# Patient Record
Sex: Male | Born: 1940 | Race: Black or African American | Hispanic: No | Smoking: Former smoker
Health system: Southern US, Community
[De-identification: ages and names within clinical notes are randomized; demographics above are authoritative.]

## PROBLEM LIST (undated history)

## (undated) DIAGNOSIS — I6529 Occlusion and stenosis of unspecified carotid artery: Secondary | ICD-10-CM

## (undated) DIAGNOSIS — R7989 Other specified abnormal findings of blood chemistry: Secondary | ICD-10-CM

## (undated) DIAGNOSIS — N189 Chronic kidney disease, unspecified: Secondary | ICD-10-CM

## (undated) DIAGNOSIS — M109 Gout, unspecified: Secondary | ICD-10-CM

## (undated) DIAGNOSIS — K2210 Ulcer of esophagus without bleeding: Secondary | ICD-10-CM

## (undated) DIAGNOSIS — D759 Disease of blood and blood-forming organs, unspecified: Secondary | ICD-10-CM

## (undated) DIAGNOSIS — I10 Essential (primary) hypertension: Secondary | ICD-10-CM

## (undated) DIAGNOSIS — C61 Malignant neoplasm of prostate: Secondary | ICD-10-CM

## (undated) DIAGNOSIS — I639 Cerebral infarction, unspecified: Secondary | ICD-10-CM

## (undated) DIAGNOSIS — G4730 Sleep apnea, unspecified: Secondary | ICD-10-CM

## (undated) DIAGNOSIS — E785 Hyperlipidemia, unspecified: Secondary | ICD-10-CM

## (undated) DIAGNOSIS — N529 Male erectile dysfunction, unspecified: Secondary | ICD-10-CM

## (undated) DIAGNOSIS — H9190 Unspecified hearing loss, unspecified ear: Secondary | ICD-10-CM

## (undated) DIAGNOSIS — I219 Acute myocardial infarction, unspecified: Secondary | ICD-10-CM

## (undated) DIAGNOSIS — R0600 Dyspnea, unspecified: Secondary | ICD-10-CM

## (undated) DIAGNOSIS — G629 Polyneuropathy, unspecified: Secondary | ICD-10-CM

## (undated) DIAGNOSIS — E119 Type 2 diabetes mellitus without complications: Secondary | ICD-10-CM

## (undated) DIAGNOSIS — I6523 Occlusion and stenosis of bilateral carotid arteries: Secondary | ICD-10-CM

## (undated) DIAGNOSIS — R778 Other specified abnormalities of plasma proteins: Secondary | ICD-10-CM

## (undated) DIAGNOSIS — N185 Chronic kidney disease, stage 5: Secondary | ICD-10-CM

## (undated) DIAGNOSIS — K219 Gastro-esophageal reflux disease without esophagitis: Secondary | ICD-10-CM

## (undated) DIAGNOSIS — I25810 Atherosclerosis of coronary artery bypass graft(s) without angina pectoris: Secondary | ICD-10-CM

## (undated) HISTORY — PX: CORONARY ANGIOPLASTY: SHX604

## (undated) HISTORY — PX: ARTERIAL BYPASS SURGRY: SHX557

## (undated) HISTORY — PX: CAROTID ENDARTERECTOMY: SUR193

## (undated) HISTORY — PX: CORONARY ARTERY BYPASS GRAFT: SHX141

## (undated) HISTORY — PX: OTHER SURGICAL HISTORY: SHX169

---

## 2002-04-27 HISTORY — PX: OTHER SURGICAL HISTORY: SHX169

## 2002-05-27 HISTORY — PX: OTHER SURGICAL HISTORY: SHX169

## 2007-09-26 ENCOUNTER — Ambulatory Visit: Payer: Self-pay | Admitting: Internal Medicine

## 2007-11-04 ENCOUNTER — Ambulatory Visit: Payer: Self-pay | Admitting: Unknown Physician Specialty

## 2007-11-28 ENCOUNTER — Ambulatory Visit: Payer: Self-pay | Admitting: Unknown Physician Specialty

## 2007-12-29 ENCOUNTER — Ambulatory Visit: Payer: Self-pay | Admitting: Unknown Physician Specialty

## 2008-03-03 ENCOUNTER — Ambulatory Visit: Payer: Self-pay | Admitting: Gastroenterology

## 2008-12-18 ENCOUNTER — Ambulatory Visit: Payer: Self-pay | Admitting: Internal Medicine

## 2009-11-01 ENCOUNTER — Ambulatory Visit: Payer: Self-pay | Admitting: Family Medicine

## 2010-09-06 ENCOUNTER — Ambulatory Visit: Payer: Self-pay | Admitting: Family Medicine

## 2011-02-08 ENCOUNTER — Ambulatory Visit: Payer: Self-pay | Admitting: Internal Medicine

## 2012-05-15 ENCOUNTER — Ambulatory Visit: Payer: Self-pay | Admitting: Internal Medicine

## 2012-06-05 ENCOUNTER — Ambulatory Visit: Payer: Self-pay | Admitting: Internal Medicine

## 2013-11-11 ENCOUNTER — Ambulatory Visit: Payer: Self-pay | Admitting: Internal Medicine

## 2015-08-02 ENCOUNTER — Emergency Department: Payer: Medicare Other

## 2015-08-02 ENCOUNTER — Encounter: Payer: Self-pay | Admitting: Emergency Medicine

## 2015-08-02 ENCOUNTER — Inpatient Hospital Stay
Admission: EM | Admit: 2015-08-02 | Discharge: 2015-08-06 | DRG: 392 | Disposition: A | Discharge status: Home / Self Care | Payer: Medicare Other | Attending: Internal Medicine | Admitting: Internal Medicine

## 2015-08-02 DIAGNOSIS — M109 Gout, unspecified: Secondary | ICD-10-CM | POA: Diagnosis present

## 2015-08-02 DIAGNOSIS — Z794 Long term (current) use of insulin: Secondary | ICD-10-CM

## 2015-08-02 DIAGNOSIS — E1022 Type 1 diabetes mellitus with diabetic chronic kidney disease: Secondary | ICD-10-CM | POA: Diagnosis present

## 2015-08-02 DIAGNOSIS — E1040 Type 1 diabetes mellitus with diabetic neuropathy, unspecified: Secondary | ICD-10-CM | POA: Diagnosis present

## 2015-08-02 DIAGNOSIS — G629 Polyneuropathy, unspecified: Secondary | ICD-10-CM | POA: Diagnosis present

## 2015-08-02 DIAGNOSIS — Z8249 Family history of ischemic heart disease and other diseases of the circulatory system: Secondary | ICD-10-CM | POA: Diagnosis not present

## 2015-08-02 DIAGNOSIS — I214 Non-ST elevation (NSTEMI) myocardial infarction: Secondary | ICD-10-CM

## 2015-08-02 DIAGNOSIS — I2510 Atherosclerotic heart disease of native coronary artery without angina pectoris: Secondary | ICD-10-CM | POA: Diagnosis present

## 2015-08-02 DIAGNOSIS — G4733 Obstructive sleep apnea (adult) (pediatric): Secondary | ICD-10-CM | POA: Diagnosis present

## 2015-08-02 DIAGNOSIS — Z8546 Personal history of malignant neoplasm of prostate: Secondary | ICD-10-CM

## 2015-08-02 DIAGNOSIS — I129 Hypertensive chronic kidney disease with stage 1 through stage 4 chronic kidney disease, or unspecified chronic kidney disease: Secondary | ICD-10-CM | POA: Diagnosis present

## 2015-08-02 DIAGNOSIS — J189 Pneumonia, unspecified organism: Secondary | ICD-10-CM

## 2015-08-02 DIAGNOSIS — D72829 Elevated white blood cell count, unspecified: Secondary | ICD-10-CM | POA: Diagnosis present

## 2015-08-02 DIAGNOSIS — R1011 Right upper quadrant pain: Secondary | ICD-10-CM | POA: Diagnosis present

## 2015-08-02 DIAGNOSIS — K210 Gastro-esophageal reflux disease with esophagitis: Secondary | ICD-10-CM | POA: Diagnosis present

## 2015-08-02 DIAGNOSIS — N183 Chronic kidney disease, stage 3 (moderate): Secondary | ICD-10-CM | POA: Diagnosis present

## 2015-08-02 DIAGNOSIS — I6523 Occlusion and stenosis of bilateral carotid arteries: Secondary | ICD-10-CM | POA: Diagnosis present

## 2015-08-02 DIAGNOSIS — R109 Unspecified abdominal pain: Secondary | ICD-10-CM | POA: Insufficient documentation

## 2015-08-02 DIAGNOSIS — R1013 Epigastric pain: Secondary | ICD-10-CM | POA: Diagnosis present

## 2015-08-02 DIAGNOSIS — Z833 Family history of diabetes mellitus: Secondary | ICD-10-CM | POA: Diagnosis not present

## 2015-08-02 DIAGNOSIS — E785 Hyperlipidemia, unspecified: Secondary | ICD-10-CM | POA: Diagnosis present

## 2015-08-02 DIAGNOSIS — K2210 Ulcer of esophagus without bleeding: Secondary | ICD-10-CM | POA: Diagnosis present

## 2015-08-02 DIAGNOSIS — E669 Obesity, unspecified: Secondary | ICD-10-CM | POA: Diagnosis present

## 2015-08-02 DIAGNOSIS — I248 Other forms of acute ischemic heart disease: Secondary | ICD-10-CM | POA: Diagnosis present

## 2015-08-02 DIAGNOSIS — Z6841 Body Mass Index (BMI) 40.0 and over, adult: Secondary | ICD-10-CM | POA: Diagnosis not present

## 2015-08-02 DIAGNOSIS — K2980 Duodenitis without bleeding: Secondary | ICD-10-CM | POA: Diagnosis present

## 2015-08-02 HISTORY — DX: Gout, unspecified: M10.9

## 2015-08-02 HISTORY — DX: Atherosclerosis of coronary artery bypass graft(s) without angina pectoris: I25.810

## 2015-08-02 HISTORY — DX: Malignant neoplasm of prostate: C61

## 2015-08-02 HISTORY — DX: Polyneuropathy, unspecified: G62.9

## 2015-08-02 HISTORY — DX: Essential (primary) hypertension: I10

## 2015-08-02 HISTORY — DX: Type 2 diabetes mellitus without complications: E11.9

## 2015-08-02 LAB — CBC
HEMATOCRIT: 43 % (ref 40.0–52.0)
Hemoglobin: 14.4 g/dL (ref 13.0–18.0)
MCH: 29.9 pg (ref 26.0–34.0)
MCHC: 33.5 g/dL (ref 32.0–36.0)
MCV: 89.3 fL (ref 80.0–100.0)
PLATELETS: 272 10*3/uL (ref 150–440)
RBC: 4.81 MIL/uL (ref 4.40–5.90)
RDW: 13.3 % (ref 11.5–14.5)
WBC: 15.3 10*3/uL — ABNORMAL HIGH (ref 3.8–10.6)

## 2015-08-02 LAB — COMPREHENSIVE METABOLIC PANEL
ALT: 20 U/L (ref 17–63)
AST: 28 U/L (ref 15–41)
Albumin: 4.1 g/dL (ref 3.5–5.0)
Alkaline Phosphatase: 179 U/L — ABNORMAL HIGH (ref 38–126)
Anion gap: 9 (ref 5–15)
BUN: 33 mg/dL — ABNORMAL HIGH (ref 6–20)
CHLORIDE: 104 mmol/L (ref 101–111)
CO2: 21 mmol/L — ABNORMAL LOW (ref 22–32)
Calcium: 9.1 mg/dL (ref 8.9–10.3)
Creatinine, Ser: 2.19 mg/dL — ABNORMAL HIGH (ref 0.61–1.24)
GFR, EST AFRICAN AMERICAN: 33 mL/min — AB (ref >60)
GFR, EST NON AFRICAN AMERICAN: 28 mL/min — AB (ref >60)
Glucose, Bld: 296 mg/dL — ABNORMAL HIGH (ref 65–99)
POTASSIUM: 4.5 mmol/L (ref 3.5–5.1)
Sodium: 134 mmol/L — ABNORMAL LOW (ref 135–145)
Total Bilirubin: 1.3 mg/dL — ABNORMAL HIGH (ref 0.3–1.2)
Total Protein: 7.9 g/dL (ref 6.5–8.1)

## 2015-08-02 LAB — CBC WITH DIFFERENTIAL/PLATELET
BASOS ABS: 0.1 10*3/uL (ref 0–0.1)
Basophils Relative: 0 %
EOS PCT: 0 %
Eosinophils Absolute: 0 10*3/uL (ref 0–0.7)
HCT: 42.5 % (ref 40.0–52.0)
Hemoglobin: 14.2 g/dL (ref 13.0–18.0)
LYMPHS ABS: 1.4 10*3/uL (ref 1.0–3.6)
LYMPHS PCT: 9 %
MCH: 29.8 pg (ref 26.0–34.0)
MCHC: 33.4 g/dL (ref 32.0–36.0)
MCV: 89.2 fL (ref 80.0–100.0)
MONO ABS: 0.9 10*3/uL (ref 0.2–1.0)
Monocytes Relative: 6 %
Neutro Abs: 12.4 10*3/uL — ABNORMAL HIGH (ref 1.4–6.5)
Neutrophils Relative %: 85 %
PLATELETS: 283 10*3/uL (ref 150–440)
RBC: 4.77 MIL/uL (ref 4.40–5.90)
RDW: 13.5 % (ref 11.5–14.5)
WBC: 14.7 10*3/uL — ABNORMAL HIGH (ref 3.8–10.6)

## 2015-08-02 LAB — URINALYSIS COMPLETE WITH MICROSCOPIC (ARMC ONLY)
BILIRUBIN URINE: NEGATIVE
Bacteria, UA: NONE SEEN
Ketones, ur: NEGATIVE mg/dL
LEUKOCYTES UA: NEGATIVE
NITRITE: NEGATIVE
Protein, ur: >500 mg/dL — AB
SPECIFIC GRAVITY, URINE: 1.014 (ref 1.005–1.030)
Squamous Epithelial / LPF: NONE SEEN
pH: 5 (ref 5.0–8.0)

## 2015-08-02 LAB — GLUCOSE, CAPILLARY
GLUCOSE-CAPILLARY: 241 mg/dL — AB (ref 65–99)
GLUCOSE-CAPILLARY: 277 mg/dL — AB (ref 65–99)

## 2015-08-02 LAB — TROPONIN I
TROPONIN I: 0.25 ng/mL — AB (ref <0.031)
TROPONIN I: 0.15 ng/mL — AB (ref <0.031)
Troponin I: 0.21 ng/mL — ABNORMAL HIGH (ref <0.031)
Troponin I: 0.17 ng/mL — ABNORMAL HIGH (ref <0.031)

## 2015-08-02 LAB — LIPASE, BLOOD: LIPASE: 38 U/L (ref 22–51)

## 2015-08-02 LAB — PROTIME-INR
INR: 1.02
Prothrombin Time: 13.6 seconds (ref 11.4–15.0)

## 2015-08-02 LAB — TSH: TSH: 0.831 u[IU]/mL (ref 0.350–4.500)

## 2015-08-02 LAB — CREATININE, SERUM
Creatinine, Ser: 2.29 mg/dL — ABNORMAL HIGH (ref 0.61–1.24)
GFR calc Af Amer: 31 mL/min — ABNORMAL LOW (ref >60)
GFR, EST NON AFRICAN AMERICAN: 27 mL/min — AB (ref >60)

## 2015-08-02 LAB — APTT: aPTT: 25 seconds (ref 24–36)

## 2015-08-02 MED ORDER — HEPARIN (PORCINE) IN NACL 100-0.45 UNIT/ML-% IJ SOLN
1350.0000 [IU]/h | INTRAMUSCULAR | Status: DC
  Filled 2015-08-02 (×2): qty 250

## 2015-08-02 MED ORDER — ACETAMINOPHEN 325 MG PO TABS: 650.0000 mg | ORAL_TABLET | Freq: Four times a day (QID) | ORAL | Status: DC | PRN

## 2015-08-02 MED ORDER — SODIUM CHLORIDE 0.9 % IV BOLUS (SEPSIS)
500.0000 mL | Freq: Once | INTRAVENOUS | Status: AC
Start: 2015-08-02 — End: 2015-08-02
  Administered 2015-08-02: 500 mL via INTRAVENOUS

## 2015-08-02 MED ORDER — SODIUM CHLORIDE 0.9 % IJ SOLN
3.0000 mL | Freq: Two times a day (BID) | INTRAMUSCULAR | Status: DC
  Administered 2015-08-02 – 2015-08-05 (×8): 3 mL via INTRAVENOUS

## 2015-08-02 MED ORDER — ONDANSETRON HCL 4 MG PO TABS
4.0000 mg | ORAL_TABLET | Freq: Four times a day (QID) | ORAL | Status: DC | PRN
Start: 2015-08-02 — End: 2015-08-06

## 2015-08-02 MED ORDER — LIVING WELL WITH DIABETES BOOK
Freq: Once | Status: AC
  Administered 2015-08-02: 14:00:00
  Filled 2015-08-02: qty 1

## 2015-08-02 MED ORDER — INSULIN ASPART 100 UNIT/ML ~~LOC~~ SOLN
0.0000 [IU] | Freq: Three times a day (TID) | SUBCUTANEOUS | Status: DC
  Administered 2015-08-02: 7 [IU] via SUBCUTANEOUS
  Administered 2015-08-03: 11 [IU] via SUBCUTANEOUS
  Administered 2015-08-03 (×2): 4 [IU] via SUBCUTANEOUS
  Administered 2015-08-04: 3 [IU] via SUBCUTANEOUS
  Administered 2015-08-04: 11 [IU] via SUBCUTANEOUS
  Administered 2015-08-04 – 2015-08-05 (×3): 4 [IU] via SUBCUTANEOUS
  Administered 2015-08-05: 3 [IU] via SUBCUTANEOUS
  Administered 2015-08-06: 12:00:00 7 [IU] via SUBCUTANEOUS
  Administered 2015-08-06: 4 [IU] via SUBCUTANEOUS
  Filled 2015-08-02 (×2): qty 3
  Filled 2015-08-02: qty 11
  Filled 2015-08-02: qty 4
  Filled 2015-08-02: qty 7
  Filled 2015-08-02 (×3): qty 4
  Filled 2015-08-02: qty 3
  Filled 2015-08-02: qty 7
  Filled 2015-08-02 (×2): qty 4
  Filled 2015-08-02: qty 11

## 2015-08-02 MED ORDER — METOPROLOL SUCCINATE ER 100 MG PO TB24
100.0000 mg | ORAL_TABLET | Freq: Every day | ORAL | Status: DC
  Administered 2015-08-02 – 2015-08-06 (×5): 100 mg via ORAL
  Filled 2015-08-02 (×3): qty 1
  Filled 2015-08-02: qty 2
  Filled 2015-08-02: qty 1

## 2015-08-02 MED ORDER — ONDANSETRON HCL 4 MG/2ML IJ SOLN
4.0000 mg | Freq: Four times a day (QID) | INTRAMUSCULAR | Status: DC | PRN
  Administered 2015-08-02: 4 mg via INTRAVENOUS
  Filled 2015-08-02: qty 2

## 2015-08-02 MED ORDER — HYDRALAZINE HCL 20 MG/ML IJ SOLN: 10.0000 mg | Freq: Four times a day (QID) | INTRAMUSCULAR | Status: DC | PRN

## 2015-08-02 MED ORDER — ACETAMINOPHEN 650 MG RE SUPP: 650.0000 mg | Freq: Four times a day (QID) | RECTAL | Status: DC | PRN

## 2015-08-02 MED ORDER — AMLODIPINE BESYLATE 10 MG PO TABS
10.0000 mg | ORAL_TABLET | Freq: Every day | ORAL | Status: DC
  Administered 2015-08-02 – 2015-08-06 (×5): 10 mg via ORAL
  Filled 2015-08-02 (×5): qty 1

## 2015-08-02 MED ORDER — SENNOSIDES-DOCUSATE SODIUM 8.6-50 MG PO TABS
1.0000 | ORAL_TABLET | Freq: Every evening | ORAL | Status: DC | PRN
  Administered 2015-08-05 (×2): 1 via ORAL
  Filled 2015-08-02 (×2): qty 1

## 2015-08-02 MED ORDER — ENOXAPARIN SODIUM 40 MG/0.4ML ~~LOC~~ SOLN
40.0000 mg | Freq: Two times a day (BID) | SUBCUTANEOUS | Status: DC
  Administered 2015-08-02 – 2015-08-06 (×9): 40 mg via SUBCUTANEOUS
  Filled 2015-08-02 (×9): qty 0.4

## 2015-08-02 MED ORDER — PANTOPRAZOLE SODIUM 40 MG IV SOLR
40.0000 mg | Freq: Two times a day (BID) | INTRAVENOUS | Status: DC
  Administered 2015-08-02 – 2015-08-06 (×9): 40 mg via INTRAVENOUS
  Filled 2015-08-02 (×9): qty 40

## 2015-08-02 MED ORDER — HEPARIN BOLUS VIA INFUSION
4000.0000 [IU] | Freq: Once | INTRAVENOUS | Status: DC
  Filled 2015-08-02: qty 4000

## 2015-08-02 MED ORDER — FUROSEMIDE 20 MG PO TABS
20.0000 mg | ORAL_TABLET | Freq: Two times a day (BID) | ORAL | Status: DC
  Administered 2015-08-02 – 2015-08-06 (×8): 20 mg via ORAL
  Filled 2015-08-02 (×8): qty 1

## 2015-08-02 MED ORDER — HYDROCODONE-ACETAMINOPHEN 5-325 MG PO TABS
1.0000 | ORAL_TABLET | ORAL | Status: DC | PRN
Start: 2015-08-02 — End: 2015-08-06

## 2015-08-02 MED ORDER — ALUM & MAG HYDROXIDE-SIMETH 200-200-20 MG/5ML PO SUSP
30.0000 mL | Freq: Four times a day (QID) | ORAL | Status: DC | PRN
  Administered 2015-08-04 – 2015-08-05 (×3): 30 mL via ORAL
  Filled 2015-08-02 (×3): qty 30

## 2015-08-02 MED ORDER — ASPIRIN 81 MG PO CHEW
324.0000 mg | CHEWABLE_TABLET | Freq: Once | ORAL | Status: AC
  Administered 2015-08-02: 324 mg via ORAL
  Filled 2015-08-02: qty 4

## 2015-08-02 MED ORDER — OXYCODONE-ACETAMINOPHEN 5-325 MG PO TABS
1.0000 | ORAL_TABLET | Freq: Three times a day (TID) | ORAL | Status: DC
  Administered 2015-08-02 – 2015-08-06 (×10): 1 via ORAL
  Filled 2015-08-02 (×10): qty 1

## 2015-08-02 MED ORDER — ATORVASTATIN CALCIUM 20 MG PO TABS
40.0000 mg | ORAL_TABLET | Freq: Every day | ORAL | Status: DC
  Administered 2015-08-02 – 2015-08-05 (×4): 40 mg via ORAL
  Filled 2015-08-02 (×4): qty 2

## 2015-08-02 MED ORDER — CLONIDINE HCL 0.1 MG PO TABS
0.2000 mg | ORAL_TABLET | Freq: Two times a day (BID) | ORAL | Status: DC
  Administered 2015-08-02 – 2015-08-06 (×9): 0.2 mg via ORAL
  Filled 2015-08-02 (×10): qty 2

## 2015-08-02 MED ORDER — ONDANSETRON HCL 4 MG/2ML IJ SOLN
4.0000 mg | Freq: Once | INTRAMUSCULAR | Status: AC
  Administered 2015-08-02: 4 mg via INTRAVENOUS
  Filled 2015-08-02: qty 2

## 2015-08-02 MED ORDER — INSULIN ASPART 100 UNIT/ML ~~LOC~~ SOLN
0.0000 [IU] | Freq: Every day | SUBCUTANEOUS | Status: DC
  Administered 2015-08-02: 3 [IU] via SUBCUTANEOUS
  Administered 2015-08-05: 22:00:00 2 [IU] via SUBCUTANEOUS
  Filled 2015-08-02: qty 2

## 2015-08-02 MED ORDER — MORPHINE SULFATE (PF) 4 MG/ML IV SOLN
4.0000 mg | Freq: Once | INTRAVENOUS | Status: AC
  Administered 2015-08-02: 4 mg via INTRAVENOUS
  Filled 2015-08-02: qty 1

## 2015-08-02 MED ORDER — ISOSORBIDE MONONITRATE ER 60 MG PO TB24
120.0000 mg | ORAL_TABLET | Freq: Every day | ORAL | Status: DC
  Administered 2015-08-02 – 2015-08-06 (×5): 120 mg via ORAL
  Filled 2015-08-02 (×2): qty 2
  Filled 2015-08-02: qty 4
  Filled 2015-08-02: qty 2
  Filled 2015-08-02: qty 4

## 2015-08-02 MED ORDER — CLOPIDOGREL BISULFATE 75 MG PO TABS
75.0000 mg | ORAL_TABLET | Freq: Every day | ORAL | Status: DC
  Administered 2015-08-02: 75 mg via ORAL
  Filled 2015-08-02: qty 1

## 2015-08-02 MED ORDER — HYDROXYZINE HCL 25 MG PO TABS: 25.0000 mg | ORAL_TABLET | Freq: Four times a day (QID) | ORAL | Status: DC | PRN

## 2015-08-02 MED ORDER — SODIUM CHLORIDE 0.9 % IV SOLN
INTRAVENOUS | Status: DC
  Administered 2015-08-02 – 2015-08-05 (×8): via INTRAVENOUS

## 2015-08-02 MED ORDER — HYDRALAZINE HCL 10 MG PO TABS
10.0000 mg | ORAL_TABLET | Freq: Four times a day (QID) | ORAL | Status: DC
  Administered 2015-08-02 – 2015-08-06 (×14): 10 mg via ORAL
  Filled 2015-08-02 (×14): qty 1

## 2015-08-02 NOTE — Progress Notes (Signed)
Inpatient Diabetes Program Recommendations  AACE/ADA: New Consensus Statement on Inpatient Glycemic Control (2013)  Target Ranges:  Prepandial:   less than 140 mg/dL      Peak postprandial:   less than 180 mg/dL (1-2 hours)      Critically ill patients:  140 - 180 mg/dL    Note: DM Coordinator on site tomorrow 9/6, will follow up and look at patient then. Have ordered the Living Well with diabetes educational booklet and have ordered the RN to start insulin administration and glucometer checking if applicable. Also wrote for RN to start teaching survival skills and order a Dietitian consult if needed for nutritional education.  Thanks,  Tama Headings RN, MSN, [facility] Inpatient Diabetes Coordinator Team Pager [phone]

## 2015-08-02 NOTE — Progress Notes (Signed)
74 y/o M ordered Lovenox 40 mg daily for DVT prophylaxis. Patient is 72" and 136 kg with a BMI of 41 so will increase Lovenox to 40 mg bid.

## 2015-08-02 NOTE — ED Notes (Signed)
Patient transported to CT 

## 2015-08-02 NOTE — Consult Note (Signed)
[facility] Cardiology  CARDIOLOGY CONSULT NOTE  Patient ID: Cory Miller MRN: [id number] DOB/AGE: Mar 14, 1941 74 y.o.  Admit date: 08/02/2015 Referring Physician Bettey Costa MD Primary Physician [facility] Primary Cardiologist Serafina Royals MD Reason for Consultation borderline elevated troponin  HPI: The patient is a 74 year old gentleman with known coronary artery disease, status post CABG 2002, who presents to [facility] emergency room with a history of mid epigastric and right upper quadrant pain. She has experienced nausea with vomiting denies constipation or diarrhea. Admission labs were notable for elevated BUN and creatinine are 33 and 2.19, respectively. Troponin was 0.25. Follow-up troponin was 0.21. EKG was nondiagnostic. The patient denies chest pain. Abdominal CT scan was nondiagnostic. Ultrasound revealed sludge. No gallstones or evidence for acute cholecystitis  Review of systems complete and found to be negative unless listed above     Past Medical History  Diagnosis Date  . Diabetes mellitus without complication   . Hypertension   . Prostate ca   . Gout   . Neuropathy   . CAD (coronary artery disease) of artery bypass graft     Past Surgical History  Procedure Laterality Date  . Arterial bypass surgry      Prescriptions prior to admission  Medication Sig Dispense Refill Last Dose  . amLODipine (NORVASC) 10 MG tablet Take 10 mg by mouth daily.   08/01/2015 at Unknown time  . atorvastatin (LIPITOR) 40 MG tablet Take 40 mg by mouth at bedtime.   08/01/2015 at Unknown time  . cloNIDine (CATAPRES) 0.2 MG tablet Take 0.2 mg by mouth 2 (two) times daily.   08/01/2015 at Unknown time  . clopidogrel (PLAVIX) 75 MG tablet Take 75 mg by mouth daily.   08/01/2015 at Unknown time  . furosemide (LASIX) 20 MG tablet Take 20 mg by mouth 2 (two) times daily.   08/01/2015 at Unknown time  . hydrALAZINE (APRESOLINE) 10 MG tablet Take 10 mg by mouth 4 (four) times daily.   08/01/2015 at Unknown time  .  hydrOXYzine (ATARAX/VISTARIL) 25 MG tablet Take 25 mg by mouth every 6 (six) hours as needed for itching.   08/01/2015 at Unknown time  . isosorbide mononitrate (IMDUR) 120 MG 24 hr tablet Take 120 mg by mouth daily.   08/01/2015 at Unknown time  . metoprolol succinate (TOPROL-XL) 100 MG 24 hr tablet Take 100 mg by mouth daily.   08/01/2015 at 1200  . oxyCODONE-acetaminophen (PERCOCET/ROXICET) 5-325 MG per tablet Take 1 tablet by mouth 3 (three) times daily.   08/01/2015 at 1200  . triamcinolone cream (KENALOG) 0.1 % Apply 1 application topically 2 (two) times daily.   08/01/2015 at Unknown time   Social History   Social History  . Marital Status: Divorced    Spouse Name: N/A  . Number of Children: N/A  . Years of Education: N/A   Occupational History  . Not on file.   Social History Main Topics  . Smoking status: Never Smoker   . Smokeless tobacco: Not on file  . Alcohol Use: No  . Drug Use: Not on file  . Sexual Activity: Not on file   Other Topics Concern  . Not on file   Social History Narrative  . No narrative on file    History reviewed. No pertinent family history.    Review of systems complete and found to be negative unless listed above      PHYSICAL EXAM  General: Well developed, well nourished, in no acute distress HEENT:  Normocephalic and  atramatic Neck:  No JVD.  Lungs: Clear bilaterally to auscultation and percussion. Heart: HRRR . Normal S1 and S2 without gallops or murmurs.  Abdomen: Bowel sounds are positive, abdomen soft and non-tender  Msk:  Back normal, normal gait. Normal strength and tone for age. Extremities: No clubbing, cyanosis or edema.   Neuro: Alert and oriented X 3. Psych:  Good affect, responds appropriately  Labs:   Lab Results  Component Value Date   WBC 15.3* 08/02/2015   HGB 14.4 08/02/2015   HCT 43.0 08/02/2015   MCV 89.3 08/02/2015   PLT 272 08/02/2015    Recent Labs Lab 08/02/15 0849 08/02/15 1431  NA 134*  --   K 4.5  --    CL 104  --   CO2 21*  --   BUN 33*  --   CREATININE 2.19* 2.29*  CALCIUM 9.1  --   PROT 7.9  --   BILITOT 1.3*  --   ALKPHOS 179*  --   ALT 20  --   AST 28  --   GLUCOSE 296*  --    Lab Results  Component Value Date   TROPONINI 0.17* 08/02/2015   No results found for: CHOL No results found for: HDL No results found for: LDLCALC No results found for: TRIG No results found for: CHOLHDL No results found for: LDLDIRECT    Radiology: Ct Abdomen Pelvis Wo Contrast  08/02/2015   CLINICAL DATA:  74 year old male with acute abdominal pain for 2 days with nausea.  EXAM: CT ABDOMEN AND PELVIS WITHOUT CONTRAST  TECHNIQUE: Multidetector CT imaging of the abdomen and pelvis was performed following the standard protocol without IV contrast.  COMPARISON:  08/02/2015 ultrasound  FINDINGS: Please note that parenchymal abnormalities may be missed without intravenous contrast.  Lower chest:  Cardiomegaly and minimal basilar scarring noted.  Hepatobiliary: The liver and gallbladder are unremarkable. There is no evidence of biliary dilatation.  Pancreas: Unremarkable  Spleen: Unremarkable  Adrenals/Urinary Tract: A probable right renal cyst is noted. There is no evidence of hydronephrosis or urinary calculi. The adrenal glands and bladder are unremarkable.  Stomach/Bowel: Unremarkable. There is no evidence of bowel obstruction or definite bowel wall thickening. The appendix is normal.  Vascular/Lymphatic: No enlarged lymph nodes or abdominal aortic aneurysm. Aortic and branch vessel atherosclerotic calcifications noted.  Reproductive: Prostate unremarkable  Other: No free fluid or pneumoperitoneum.  Musculoskeletal: Mild bilateral anterior subcutaneous stranding along the lower abdomen/ pelvis identified of uncertain chronicity.  IMPRESSION: Mild bilateral anterior lower abdominal/ pelvic subcutaneous stranding of uncertain chronicity. This may represent mild inflammation/edema but correlate clinically.  No  evidence of acute abnormality within the abdomen or pelvis.  Cardiomegaly and aortic atherosclerosis.   Electronically Signed   By: Margarette Canada M.D.   On: 08/02/2015 12:12   Dg Chest Port 1 View  08/02/2015   CLINICAL DATA:  74 year old male with abdominal pain for the past 2 days.  EXAM: PORTABLE CHEST - 1 VIEW  COMPARISON:  No priors.  FINDINGS: Lung volumes are low. There are bibasilar opacities that may reflect areas of atelectasis and/or consolidation. Possible small bilateral pleural effusions (left greater than right). No pneumothorax. No evidence pulmonary edema. Mild cardiomegaly, accentuated by low lung volumes. The patient is rotated to the left on today's exam, resulting in distortion of the mediastinal contours and reduced diagnostic sensitivity and specificity for mediastinal pathology. Atherosclerosis in the thoracic aorta. Status post median sternotomy for CABG.  IMPRESSION: 1. Low lung volumes with  bibasilar areas of atelectasis and/or consolidation and probable small bilateral pleural effusions. 2. Mild cardiomegaly. 3. Atherosclerosis.   Electronically Signed   By: Vinnie Langton M.D.   On: 08/02/2015 10:02   US Abdomen Limited Ruq  08/02/2015   CLINICAL DATA:  Right upper quadrant abdominal pain for 1 day. Nausea.  EXAM: US ABDOMEN LIMITED - RIGHT UPPER QUADRANT  COMPARISON:  Renal ultrasound dated 09/26/2007.  FINDINGS: Gallbladder:  Small amount of sludge. No gallstones, wall thickening or pericholecystic fluid. The patient was not tender over the gallbladder.  Common bile duct:  Diameter: 6.3 mm.  Liver:  Diffusely echogenic and mildly heterogeneous.  IMPRESSION: 1. Small amount of sludge in the gallbladder without gallstones or evidence of cholecystitis. 2. Diffusely echogenic and mildly heterogeneous liver. This is most likely due to hepatic steatosis. Cirrhosis and chronic hepatitis can also have this appearance.   Electronically Signed   By: Claudie Revering M.D.   On: 08/02/2015 09:57     EKG: Normal sinus rhythm without acute ischemic ST-T wave changes  ASSESSMENT AND PLAN:   1. Borderline elevated troponin, without peak or trough, without chest pain or diagnostic ECG changes, likely due to demand supply ischemia, underlying chronic kidney disease, and probable dehydration, with nausea and vomiting and decreased by po intake 2. Known coronary artery disease, status post CABG, currently without chest pain  Recommendations  1. Defer full dose anticoagulation 2. Defer cardiac catheterization 3. Continue current cardiac medications 4. 2-D echocardiogram if not performed during the past 6 months 5. Proceed with GI workup without further delay 6. Defer further cardiac diagnostics at this time   Signed: Ho Parisi MD,PhD, [facility] 08/02/2015, 3:54 PM

## 2015-08-02 NOTE — H&P (Signed)
[facility] [place] NAME: Cory Miller    MR#:  [id number]  DATE OF BIRTH:  [birth year]  DATE OF ADMISSION:  08/02/2015  PRIMARY CARE PHYSICIAN: [place]   REQUESTING/REFERRING PHYSICIAN: dr Dineen Kid  CHIEF COMPLAINT:  Abdominal pain mid epigastric and right and lower upper side  HISTORY OF PRESENT ILLNESS:  Cory Miller  is a 74 y.o. male with a known history of diabetes, essential hypertension, CAD/CABG and neuropathy presents with above complaint. Patient reports over the past 2 days he has had increasing abdominal pain located primarily in the mid epigastric region as well as the right upper and left upper quadrants. He denies constipation or diarrhea. In the emergency room he was noted to have elevation in his troponin.  PAST MEDICAL HISTORY:   Past Medical History  Diagnosis Date  . Diabetes mellitus without complication   . Hypertension   . Prostate ca   . Gout   . Neuropathy    Hyperlipidemia Obesity CAD/CABG OSA Carotid stenosis, bilateral Chronic kidney disease stage III baseline creatinine 2.1-2.4 Hyperlipidemia  PAST SURGICAL HISTORY:   Past Surgical History  Procedure Laterality Date  . Arterial bypass surgry      SOCIAL HISTORY:  No tobacco alcohol or IV drug use  FAMILY HISTORY:  Diabetes and hypertensionAnd CAD  DRUG ALLERGIES:  No Known Allergies   REVIEW OF SYSTEMS:  CONSTITUTIONAL: No fever, fatigue or weakness.  EYES: No blurred or double vision.  EARS, NOSE, AND THROAT: No tinnitus or ear pain.  RESPIRATORY: No cough,  positive shortness of breath,  no wheezing or hemoptysis.  CARDIOVASCULAR: No chest pain, orthopnea, edema.  GASTROINTESTINAL: No nausea, vomiting, diarrhea, positive midepigastric pain along with right and left upper quadrant pain.   GENITOURINARY: No dysuria, hematuria.  ENDOCRINE: No polyuria, nocturia,  HEMATOLOGY: No anemia, easy bruising or  bleeding SKIN: No rash or lesion. MUSCULOSKELETAL: No joint pain or arthritis.   NEUROLOGIC: No tingling, numbness, weakness.  PSYCHIATRY: No anxiety or depression.   MEDICATIONS AT HOME:   Prior to Admission medications   Medication Sig Start Date End Date Taking? Authorizing Provider  amLODipine (NORVASC) 10 MG tablet Take 10 mg by mouth daily.   Yes Historical Provider, MD  atorvastatin (LIPITOR) 40 MG tablet Take 40 mg by mouth at bedtime.   Yes Historical Provider, MD  cloNIDine (CATAPRES) 0.2 MG tablet Take 0.2 mg by mouth 2 (two) times daily.   Yes Historical Provider, MD  clopidogrel (PLAVIX) 75 MG tablet Take 75 mg by mouth daily.   Yes Historical Provider, MD  furosemide (LASIX) 20 MG tablet Take 20 mg by mouth 2 (two) times daily.   Yes Historical Provider, MD  hydrALAZINE (APRESOLINE) 10 MG tablet Take 10 mg by mouth 4 (four) times daily.   Yes Historical Provider, MD  hydrOXYzine (ATARAX/VISTARIL) 25 MG tablet Take 25 mg by mouth every 6 (six) hours as needed for itching.   Yes Historical Provider, MD  isosorbide mononitrate (IMDUR) 120 MG 24 hr tablet Take 120 mg by mouth daily.   Yes Historical Provider, MD  metoprolol succinate (TOPROL-XL) 100 MG 24 hr tablet Take 100 mg by mouth daily.   Yes Historical Provider, MD  oxyCODONE-acetaminophen (PERCOCET/ROXICET) 5-325 MG per tablet Take 1 tablet by mouth 3 (three) times daily.   Yes Historical Provider, MD  triamcinolone cream (KENALOG) 0.1 % Apply 1 application topically 2 (two) times daily.   Yes Historical Provider, MD  VITAL SIGNS:  Blood pressure 178/76, pulse 86, temperature 98.1 F (36.7 C), temperature source Oral, resp. rate 28, height 6' (1.829 m), weight 136.079 kg (300 lb), SpO2 98 %.  PHYSICAL EXAMINATION:  GENERAL:  74 y.o.-year-old patient lying in the bed with moderate  acute distress.  EYES: Pupils equal, round, reactive to light and accommodation. No scleral icterus. Extraocular muscles intact.   HEENT: Head atraumatic, normocephalic. Oropharynx clear.  NECK:  Supple, no jugular venous distention. No thyroid enlargement, no tenderness.  LUNGS: Normal breath sounds bilaterally, no wheezing, rales,rhonchi or crepitation. No use of accessory muscles of respiration.  CARDIOVASCULAR: S1, S2 normal. No murmurs, rubs, or gallops.  ABDOMEN: Soft, positive tenderness and guarding at the left upper quadrant positive distention hypoactive bowel sounds heart to appreciate organomegaly due to body habitus  EXTREMITIES: No pedal edema, cyanosis, or clubbing.  NEUROLOGIC: Cranial nerves II through XII are grossly intact. No focal deficits. PSYCHIATRIC: The patient is alert and oriented x 3.  SKIN: No obvious rash, lesion, or ulcer.   LABORATORY PANEL:   CBC  Recent Labs Lab 08/02/15 0849  WBC 14.7*  HGB 14.2  HCT 42.5  PLT 283   ------------------------------------------------------------------------------------------------------------------  Chemistries   Recent Labs Lab 08/02/15 0849  NA 134*  K 4.5  CL 104  CO2 21*  GLUCOSE 296*  BUN 33*  CREATININE 2.19*  CALCIUM 9.1  AST 28  ALT 20  ALKPHOS 179*  BILITOT 1.3*   ------------------------------------------------------------------------------------------------------------------  Cardiac Enzymes  Recent Labs Lab 08/02/15 0849 08/02/15 1121  TROPONINI 0.25* 0.21*   ------------------------------------------------------------------------------------------------------------------  RADIOLOGY:  Dg Chest Port 1 View  08/02/2015   CLINICAL DATA:  74 year old male with abdominal pain for the past 2 days.  EXAM: PORTABLE CHEST - 1 VIEW  COMPARISON:  No priors.  FINDINGS: Lung volumes are low. There are bibasilar opacities that may reflect areas of atelectasis and/or consolidation. Possible small bilateral pleural effusions (left greater than right). No pneumothorax. No evidence pulmonary edema. Mild cardiomegaly, accentuated  by low lung volumes. The patient is rotated to the left on today's exam, resulting in distortion of the mediastinal contours and reduced diagnostic sensitivity and specificity for mediastinal pathology. Atherosclerosis in the thoracic aorta. Status post median sternotomy for CABG.  IMPRESSION: 1. Low lung volumes with bibasilar areas of atelectasis and/or consolidation and probable small bilateral pleural effusions. 2. Mild cardiomegaly. 3. Atherosclerosis.   Electronically Signed   By: Vinnie Langton M.D.   On: 08/02/2015 10:02   US Abdomen Limited Ruq  08/02/2015   CLINICAL DATA:  Right upper quadrant abdominal pain for 1 day. Nausea.  EXAM: US ABDOMEN LIMITED - RIGHT UPPER QUADRANT  COMPARISON:  Renal ultrasound dated 09/26/2007.  FINDINGS: Gallbladder:  Small amount of sludge. No gallstones, wall thickening or pericholecystic fluid. The patient was not tender over the gallbladder.  Common bile duct:  Diameter: 6.3 mm.  Liver:  Diffusely echogenic and mildly heterogeneous.  IMPRESSION: 1. Small amount of sludge in the gallbladder without gallstones or evidence of cholecystitis. 2. Diffusely echogenic and mildly heterogeneous liver. This is most likely due to hepatic steatosis. Cirrhosis and chronic hepatitis can also have this appearance.   Electronically Signed   By: Claudie Revering M.D.   On: 08/02/2015 09:57    EKG:  NSR no ST elevation  IMPRESSION AND PLAN:  74 year old male with a history of CAD/CABG, hypertension, cad and obesity who presents with epigastric pain and found to have elevated troponin level.   1. Epigastric  pain with significant tenderness left upper quadrant: CT noncontrast shows mild bilateral anterior lower abdominal/pelvic subcutaneous stranding of uncertain chronicity. I will consult surgery for further evaluation and management as per his pain. He may also require GI consultation for endoscopy. Abdominal ultrasound shows sludge but no gallstones and no evidence of acute  cholecystitis or cholangitis.     2. Elevated troponin: Continue to follow troponin. This does not appear to be ACS given his abdominal pain on palpation. This is likely due to demand ischemia. Repeat troponin is 0.21 which is less than initial of 0.25. I suspect the GI will want cardiology consultation and clearance prior to undergoing any intervention if needed. So I will consult cardiology.  3. Accelerated essential hypertension: This is likely due to pain. Continue Norvasc, clonidine, hydralazine, Imdur and metoprolol.I will order hydralazine when necessary  4. Type 2 diabetes:Patient is not taking any medications at home. I will order sliding scale insulin and ADA diet as well as hemoglobin A1c. Diabetes coordinator consultation will be placed as well.  5. Chronic kidney disease stage III: Patient's creatinine is at baseline.  6.. Hyperlipidemia: Patient should continue on Lipitor.   7. Abnormal chest x-ray: Patient's chest x-ray is consistent with atelectasis and/or consolidation of the lower lung. He does not report any symptoms of pneumonia. He does have a slightly elevated white blood cell count. If he has a fever or white blood cell count is continue to trend the should start Levaquin. I will order incentive spirometer and a chest x-ray for the a.m.    records are reviewed and case discussed with ED provider. Management plans discussed with the patient and he is in agreement.  CODE STATUS: FULL  TOTAL TIME TAKING CARE OF THIS PATIENT: 50 minutes.    Cory Miller M.D on 08/02/2015 at 12:10 PM  Between 7am to 6pm - Pager - [phone] After 6pm go to [email or web address] - password [email or web address] [facility]  Office  [phone]  CC: Primary care physician; [place]

## 2015-08-02 NOTE — Progress Notes (Signed)
ANTICOAGULATION CONSULT NOTE - Initial Consult  Pharmacy Consult for Heparin  Indication: chest pain/ACS  No Known Allergies  Patient Measurements: Height: 6' (182.9 cm) Weight: 300 lb (136.079 kg) IBW/kg (Calculated) : 77.6 Heparin Dosing Weight: 106.7  Vital Signs: Temp: 98.1 F (36.7 C) (09/05 0810) Temp Source: Oral (09/05 0810) BP: 178/76 mmHg (09/05 0810)  Labs:  Recent Labs  08/02/15 0849  HGB 14.2  HCT 42.5  PLT 283  CREATININE 2.19*  TROPONINI 0.25*    Estimated Creatinine Clearance: 42.9 mL/min (by C-G formula based on Cr of 2.19).   Medical History: Past Medical History  Diagnosis Date  . Diabetes mellitus without complication   . Hypertension   . Prostate ca   . Gout   . Neuropathy     Assessment:  Pharmacy consulted for heparin drip in patient presenting with elevated troponins and suspected ACS/STEMI. Platelets 283, HCT 42.5, and Hgb 14.2.   Goal of Therapy:  Heparin level 0.3-0.7 units/ml Monitor platelets by anticoagulation protocol: Yes   Plan:  Give 4000 units bolus x 1 Start heparin infusion at 1350 units/hr Continue to monitor H&H and platelets  Will order baseline aPTT and PT/INR Heparin level scheduled for 1900 and CBC ordered for 0500 9/6.  Nancy Fetter, PharmD Pharmacy Resident

## 2015-08-02 NOTE — Progress Notes (Signed)
DR Benjie Karvonen was informed of pt's sbp of 207/76 , pt's schedule HTN  meds given and no prn meds given at this time will continue to monitor

## 2015-08-02 NOTE — ED Notes (Signed)
Reports abd pain since yesterday, nausea.

## 2015-08-02 NOTE — ED Notes (Signed)
Per Dr Benjie Karvonen, hold heparin administration for now.

## 2015-08-02 NOTE — Consult Note (Signed)
Patient ID: Cory Miller, male   DOB: September 07, 1941, 74 y.o.   MRN: [id number]  Chief Complaint  Patient presents with  . Abdominal Pain    HPI Location, Quality, Duration, Severity, Timing, Context, Modifying Factors, Associated Signs and Symptoms.  Cory Miller is a 74 y.o. male came to the emergency room with approximately 2 day history of epigastric abdominal pain which began rather suddenly. It was associated with nausea and vomiting no jaundice and no diarrhea. Pain was persistent. Rather severe came to the emergency room. Workup demonstrating leukocytosis. CT scan was obtained demonstrating nonspecific findings. Ultrasound of the right upper quadrant demonstrates sludge within the gallbladder notice of pericholecystic fluid or gallbladder wall thickening. Surgical services were contacted. Of note the patient does describe intermittent abdominal pain associated with heartburn and some nausea in the past. He does not describe any fatty food intolerance. History is obtained both from the patient because he is somewhat hard of hearing and from his sister and family was at the bedside.  He lives alone. There has been no sick contacts. Of note the patient is had a previous left carotid endarterectomy coronary artery bypass grafting. He did have an elevated troponin. Dr. Lorinda Creed of cardiology has seen him. No further diagnostic workup is indicated.   Past Medical History  Diagnosis Date  . Diabetes mellitus without complication   . Hypertension   . Prostate ca   . Gout   . Neuropathy   . CAD (coronary artery disease) of artery bypass graft     Past Surgical History  Procedure Laterality Date  . Arterial bypass surgry      Family history Diabetes and obesity.  Social History Social History  Substance Use Topics  . Smoking status: Never Smoker   . Smokeless tobacco: None  . Alcohol Use: No    No Known Allergies  Current Facility-Administered Medications  Medication Dose  Route Frequency Provider Last Rate Last Dose  . 0.9 %  sodium chloride infusion   Intravenous Continuous Bettey Costa, MD 75 mL/hr at 08/02/15 1537    . acetaminophen (TYLENOL) tablet 650 mg  650 mg Oral Q6H PRN Bettey Costa, MD       Or  . acetaminophen (TYLENOL) suppository 650 mg  650 mg Rectal Q6H PRN Bettey Costa, MD      . alum & mag hydroxide-simeth (MAALOX/MYLANTA) 200-200-20 MG/5ML suspension 30 mL  30 mL Oral Q6H PRN Sital Mody, MD      . amLODipine (NORVASC) tablet 10 mg  10 mg Oral Daily Bettey Costa, MD   10 mg at 08/02/15 1522  . atorvastatin (LIPITOR) tablet 40 mg  40 mg Oral QHS Sital Mody, MD      . cloNIDine (CATAPRES) tablet 0.2 mg  0.2 mg Oral BID Bettey Costa, MD   0.2 mg at 08/02/15 1519  . clopidogrel (PLAVIX) tablet 75 mg  75 mg Oral Daily Bettey Costa, MD   75 mg at 08/02/15 1520  . enoxaparin (LOVENOX) injection 40 mg  40 mg Subcutaneous Q12H Sital Mody, MD   40 mg at 08/02/15 1521  . furosemide (LASIX) tablet 20 mg  20 mg Oral BID Bettey Costa, MD      . hydrALAZINE (APRESOLINE) injection 10 mg  10 mg Intravenous Q6H PRN Bettey Costa, MD      . hydrALAZINE (APRESOLINE) tablet 10 mg  10 mg Oral QID Bettey Costa, MD   10 mg at 08/02/15 1521  . HYDROcodone-acetaminophen (NORCO/VICODIN) 5-325 MG per tablet 1-2 tablet  1-2 tablet Oral Q4H PRN Bettey Costa, MD      . hydrOXYzine (ATARAX/VISTARIL) tablet 25 mg  25 mg Oral Q6H PRN Sital Mody, MD      . insulin aspart (novoLOG) injection 0-20 Units  0-20 Units Subcutaneous TID WC Sital Mody, MD      . insulin aspart (novoLOG) injection 0-5 Units  0-5 Units Subcutaneous QHS Sital Mody, MD      . isosorbide mononitrate (IMDUR) 24 hr tablet 120 mg  120 mg Oral Daily Sital Mody, MD   120 mg at 08/02/15 1519  . metoprolol succinate (TOPROL-XL) 24 hr tablet 100 mg  100 mg Oral Daily Bettey Costa, MD   100 mg at 08/02/15 1521  . ondansetron (ZOFRAN) tablet 4 mg  4 mg Oral Q6H PRN Bettey Costa, MD       Or  . ondansetron (ZOFRAN) injection 4 mg  4 mg  Intravenous Q6H PRN Bettey Costa, MD   4 mg at 08/02/15 1521  . oxyCODONE-acetaminophen (PERCOCET/ROXICET) 5-325 MG per tablet 1 tablet  1 tablet Oral TID Bettey Costa, MD   1 tablet at 08/02/15 1519  . pantoprazole (PROTONIX) injection 40 mg  40 mg Intravenous Q12H Bettey Costa, MD   40 mg at 08/02/15 1522  . senna-docusate (Senokot-S) tablet 1 tablet  1 tablet Oral QHS PRN Bettey Costa, MD      . sodium chloride 0.9 % injection 3 mL  3 mL Intravenous Q12H Sital Mody, MD   3 mL at 08/02/15 1523    Blood pressure 207/76, pulse 87, temperature 98 F (36.7 C), temperature source Oral, resp. rate 24, height 6' (1.829 m), weight 300 lb (136.079 kg), SpO2 97 %.  Results for orders placed or performed during the hospital encounter of 08/02/15 (from the past 48 hour(s))  CBC with Differential     Status: Abnormal   Collection Time: 08/02/15  8:49 AM  Result Value Ref Range   WBC 14.7 (H) 3.8 - 10.6 K/uL   RBC 4.77 4.40 - 5.90 MIL/uL   Hemoglobin 14.2 13.0 - 18.0 g/dL   HCT 42.5 40.0 - 52.0 %   MCV 89.2 80.0 - 100.0 fL   MCH 29.8 26.0 - 34.0 pg   MCHC 33.4 32.0 - 36.0 g/dL   RDW 13.5 11.5 - 14.5 %   Platelets 283 150 - 440 K/uL   Neutrophils Relative % 85 %   Neutro Abs 12.4 (H) 1.4 - 6.5 K/uL   Lymphocytes Relative 9 %   Lymphs Abs 1.4 1.0 - 3.6 K/uL   Monocytes Relative 6 %   Monocytes Absolute 0.9 0.2 - 1.0 K/uL   Eosinophils Relative 0 %   Eosinophils Absolute 0.0 0 - 0.7 K/uL   Basophils Relative 0 %   Basophils Absolute 0.1 0 - 0.1 K/uL  Comprehensive metabolic panel     Status: Abnormal   Collection Time: 08/02/15  8:49 AM  Result Value Ref Range   Sodium 134 (L) 135 - 145 mmol/L   Potassium 4.5 3.5 - 5.1 mmol/L   Chloride 104 101 - 111 mmol/L   CO2 21 (L) 22 - 32 mmol/L   Glucose, Bld 296 (H) 65 - 99 mg/dL   BUN 33 (H) 6 - 20 mg/dL   Creatinine, Ser 2.19 (H) 0.61 - 1.24 mg/dL   Calcium 9.1 8.9 - 10.3 mg/dL   Total Protein 7.9 6.5 - 8.1 g/dL   Albumin 4.1 3.5 - 5.0 g/dL   AST 28  15 -  41 U/L   ALT 20 17 - 63 U/L   Alkaline Phosphatase 179 (H) 38 - 126 U/L   Total Bilirubin 1.3 (H) 0.3 - 1.2 mg/dL   GFR calc non Af Amer 28 (L) >60 mL/min   GFR calc Af Amer 33 (L) >60 mL/min    Comment: (NOTE) The eGFR has been calculated using the CKD EPI equation. This calculation has not been validated in all clinical situations. eGFR's persistently <60 mL/min signify possible Chronic Kidney Disease.    Anion gap 9 5 - 15  Lipase, blood     Status: None   Collection Time: 08/02/15  8:49 AM  Result Value Ref Range   Lipase 38 22 - 51 U/L  Troponin I     Status: Abnormal   Collection Time: 08/02/15  8:49 AM  Result Value Ref Range   Troponin I 0.25 (H) <0.031 ng/mL    Comment: READ BACK AND VERIFIED WITH COLLIN GILLESPIE ON 08/02/15 AT 0946 BY [facility]        PERSISTENTLY INCREASED TROPONIN VALUES IN THE RANGE OF 0.04-0.49 ng/mL CAN BE SEEN IN:       -UNSTABLE ANGINA       -CONGESTIVE HEART FAILURE       -MYOCARDITIS       -CHEST TRAUMA       -ARRYHTHMIAS       -LATE PRESENTING MYOCARDIAL INFARCTION       -COPD   CLINICAL FOLLOW-UP RECOMMENDED.   Protime-INR     Status: None   Collection Time: 08/02/15  8:49 AM  Result Value Ref Range   Prothrombin Time 13.6 11.4 - 15.0 seconds   INR 1.02   APTT     Status: None   Collection Time: 08/02/15  8:49 AM  Result Value Ref Range   aPTT 25 24 - 36 seconds  Urinalysis complete, with microscopic ([facility] only)     Status: Abnormal   Collection Time: 08/02/15 11:00 AM  Result Value Ref Range   Color, Urine YELLOW (A) YELLOW   APPearance CLEAR (A) CLEAR   Glucose, UA >500 (A) NEGATIVE mg/dL   Bilirubin Urine NEGATIVE NEGATIVE   Ketones, ur NEGATIVE NEGATIVE mg/dL   Specific Gravity, Urine 1.014 1.005 - 1.030   Hgb urine dipstick 2+ (A) NEGATIVE   pH 5.0 5.0 - 8.0   Protein, ur >500 (A) NEGATIVE mg/dL   Nitrite NEGATIVE NEGATIVE   Leukocytes, UA NEGATIVE NEGATIVE   RBC / HPF 6-30 0 - 5 RBC/hpf   WBC, UA 0-5 0 - 5 WBC/hpf    Bacteria, UA NONE SEEN NONE SEEN   Squamous Epithelial / LPF NONE SEEN NONE SEEN   Hyaline Casts, UA PRESENT   Troponin I     Status: Abnormal   Collection Time: 08/02/15 11:21 AM  Result Value Ref Range   Troponin I 0.21 (H) <0.031 ng/mL    Comment: RESULTS PREVIOUSLY CALLED AT 0946 ON 08/02/2015        PERSISTENTLY INCREASED TROPONIN VALUES IN THE RANGE OF 0.04-0.49 ng/mL CAN BE SEEN IN:       -UNSTABLE ANGINA       -CONGESTIVE HEART FAILURE       -MYOCARDITIS       -CHEST TRAUMA       -ARRYHTHMIAS       -LATE PRESENTING MYOCARDIAL INFARCTION       -COPD   CLINICAL FOLLOW-UP RECOMMENDED.   CBC     Status: Abnormal   Collection Time:  08/02/15  2:31 PM  Result Value Ref Range   WBC 15.3 (H) 3.8 - 10.6 K/uL   RBC 4.81 4.40 - 5.90 MIL/uL   Hemoglobin 14.4 13.0 - 18.0 g/dL   HCT 43.0 40.0 - 52.0 %   MCV 89.3 80.0 - 100.0 fL   MCH 29.9 26.0 - 34.0 pg   MCHC 33.5 32.0 - 36.0 g/dL   RDW 13.3 11.5 - 14.5 %   Platelets 272 150 - 440 K/uL  Creatinine, serum     Status: Abnormal   Collection Time: 08/02/15  2:31 PM  Result Value Ref Range   Creatinine, Ser 2.29 (H) 0.61 - 1.24 mg/dL   GFR calc non Af Amer 27 (L) >60 mL/min   GFR calc Af Amer 31 (L) >60 mL/min    Comment: (NOTE) The eGFR has been calculated using the CKD EPI equation. This calculation has not been validated in all clinical situations. eGFR's persistently <60 mL/min signify possible Chronic Kidney Disease.   TSH     Status: None   Collection Time: 08/02/15  2:31 PM  Result Value Ref Range   TSH 0.831 0.350 - 4.500 uIU/mL  Troponin I     Status: Abnormal   Collection Time: 08/02/15  2:31 PM  Result Value Ref Range   Troponin I 0.17 (H) <0.031 ng/mL    Comment: RESULTS PREVIOUSLY CALLED ON 08/02/15 AT 0946        PERSISTENTLY INCREASED TROPONIN VALUES IN THE RANGE OF 0.04-0.49 ng/mL CAN BE SEEN IN:       -UNSTABLE ANGINA       -CONGESTIVE HEART FAILURE       -MYOCARDITIS       -CHEST TRAUMA        -ARRYHTHMIAS       -LATE PRESENTING MYOCARDIAL INFARCTION       -COPD   CLINICAL FOLLOW-UP RECOMMENDED.    Ct Abdomen Pelvis Wo Contrast  08/02/2015   CLINICAL DATA:  74 year old male with acute abdominal pain for 2 days with nausea.  EXAM: CT ABDOMEN AND PELVIS WITHOUT CONTRAST  TECHNIQUE: Multidetector CT imaging of the abdomen and pelvis was performed following the standard protocol without IV contrast.  COMPARISON:  08/02/2015 ultrasound  FINDINGS: Please note that parenchymal abnormalities may be missed without intravenous contrast.  Lower chest:  Cardiomegaly and minimal basilar scarring noted.  Hepatobiliary: The liver and gallbladder are unremarkable. There is no evidence of biliary dilatation.  Pancreas: Unremarkable  Spleen: Unremarkable  Adrenals/Urinary Tract: A probable right renal cyst is noted. There is no evidence of hydronephrosis or urinary calculi. The adrenal glands and bladder are unremarkable.  Stomach/Bowel: Unremarkable. There is no evidence of bowel obstruction or definite bowel wall thickening. The appendix is normal.  Vascular/Lymphatic: No enlarged lymph nodes or abdominal aortic aneurysm. Aortic and branch vessel atherosclerotic calcifications noted.  Reproductive: Prostate unremarkable  Other: No free fluid or pneumoperitoneum.  Musculoskeletal: Mild bilateral anterior subcutaneous stranding along the lower abdomen/ pelvis identified of uncertain chronicity.  IMPRESSION: Mild bilateral anterior lower abdominal/ pelvic subcutaneous stranding of uncertain chronicity. This may represent mild inflammation/edema but correlate clinically.  No evidence of acute abnormality within the abdomen or pelvis.  Cardiomegaly and aortic atherosclerosis.   Electronically Signed   By: Margarette Canada M.D.   On: 08/02/2015 12:12   Dg Chest Port 1 View  08/02/2015   CLINICAL DATA:  74 year old male with abdominal pain for the past 2 days.  EXAM: PORTABLE CHEST - 1 VIEW  COMPARISON:  No priors.   FINDINGS: Lung volumes are low. There are bibasilar opacities that may reflect areas of atelectasis and/or consolidation. Possible small bilateral pleural effusions (left greater than right). No pneumothorax. No evidence pulmonary edema. Mild cardiomegaly, accentuated by low lung volumes. The patient is rotated to the left on today's exam, resulting in distortion of the mediastinal contours and reduced diagnostic sensitivity and specificity for mediastinal pathology. Atherosclerosis in the thoracic aorta. Status post median sternotomy for CABG.  IMPRESSION: 1. Low lung volumes with bibasilar areas of atelectasis and/or consolidation and probable small bilateral pleural effusions. 2. Mild cardiomegaly. 3. Atherosclerosis.   Electronically Signed   By: Vinnie Langton M.D.   On: 08/02/2015 10:02   US Abdomen Limited Ruq  08/02/2015   CLINICAL DATA:  Right upper quadrant abdominal pain for 1 day. Nausea.  EXAM: US ABDOMEN LIMITED - RIGHT UPPER QUADRANT  COMPARISON:  Renal ultrasound dated 09/26/2007.  FINDINGS: Gallbladder:  Small amount of sludge. No gallstones, wall thickening or pericholecystic fluid. The patient was not tender over the gallbladder.  Common bile duct:  Diameter: 6.3 mm.  Liver:  Diffusely echogenic and mildly heterogeneous.  IMPRESSION: 1. Small amount of sludge in the gallbladder without gallstones or evidence of cholecystitis. 2. Diffusely echogenic and mildly heterogeneous liver. This is most likely due to hepatic steatosis. Cirrhosis and chronic hepatitis can also have this appearance.   Electronically Signed   By: Claudie Revering M.D.   On: 08/02/2015 09:57    Review of Systems  Constitutional: Negative for fever, chills, weight loss, malaise/fatigue and diaphoresis.  Eyes: Negative.   Respiratory: Negative for cough, hemoptysis and sputum production.   Cardiovascular: Negative for chest pain, palpitations and orthopnea.  Gastrointestinal: Positive for heartburn, nausea, vomiting and  abdominal pain. Negative for diarrhea, constipation, blood in stool and melena.  Genitourinary: Negative for dysuria, urgency, frequency and hematuria.  Skin: Negative for itching and rash.  Neurological: Negative.  Negative for headaches.  All other systems reviewed and are negative.   Physical Exam  Constitutional: He is oriented to person, place, and time.  HENT:  Head: Normocephalic.  Eyes: Conjunctivae are normal. Pupils are equal, round, and reactive to light.  Neck: Neck supple. No tracheal tenderness present. No thyroid mass and no thyromegaly present.    Cardiovascular: Normal rate and regular rhythm.   Pulmonary/Chest: Effort normal and breath sounds normal. No respiratory distress.  Abdominal: Soft. Normal appearance. He exhibits no distension, no ascites and no mass. There is no hepatosplenomegaly. There is tenderness in the right upper quadrant and epigastric area. There is no rigidity, no rebound, no guarding and negative Murphy's sign. A hernia is present. Hernia confirmed positive in the umbilical area.  There is a small reducible umbilical hernia and the patient's abdomen is obese.  Neurological: He is alert and oriented to person, place, and time.  Skin: Skin is warm and dry.  Psychiatric: Mood, memory, affect and judgment normal.    Data Reviewed I personally reviewed the PACS images of both the CT scan and the right upper quadrant ultrasound.  I have personally reviewed the patient's imaging, laboratory findings and medical records.    Assessment    This is a 74 year old obese male with long-standing history of type 1 diabetes along with 2 days of acute onset of abdominal pain located in the epigastrium with leukocytosis and left shift.  Leading in the differential diagnosis is biliary disease versus peptic ulcer disease.  He has chronic renal insufficiency, coronary artery  disease as well as type 1 diabetes and obesity.    Plan    HIDA scan for in the a.m. and  gastroenterology consult.  Hold plavix.     The care and plan was discussed with both the patient as well as multiple family members present in the room. Total time spent was 60 minutes reviewing the chart and speaking with Dr. Benjie Karvonen of [facility].  The delay in the HIDA scan till tomorrow will allow [place] to further define the troponin trend.   Sherri Rad MD, FACS 08/02/2015, 4:44 PM

## 2015-08-02 NOTE — ED Provider Notes (Signed)
[facility] Emergency Department Provider Note  ____________________________________________  Time seen: Approximately 0830 AM  I have reviewed the triage vital signs and the nursing notes.   HISTORY  Chief Complaint Abdominal Pain    HPI Cory Miller is a 74 y.o. male with a history of prostate cancer as well as diabetes, hypertension and coronary artery disease who is presenting today with upper abdominal pain. The patient says the pain has been ongoing for about 1-2 days. It is associated with nausea and dry heaves. There are no worsening factors. The patient has not been able tolerate by mouth. The patient says he has some urinary frequency but no burning when he urinates. No diarrhea. No chest pain. No shortness of breath. Denies any history of abdominal surgeries but does have a history of CABG. Denies drinking any alcohol.   Past Medical History  Diagnosis Date  . Diabetes mellitus without complication   . Hypertension   . Prostate ca   . Gout   . Neuropathy     There are no active problems to display for this patient.   Past Surgical History  Procedure Laterality Date  . Arterial bypass surgry      No current outpatient prescriptions on file.  Allergies Review of patient's allergies indicates no known allergies.  History reviewed. No pertinent family history.  Social History Social History  Substance Use Topics  . Smoking status: Never Smoker   . Smokeless tobacco: None  . Alcohol Use: No    Review of Systems Constitutional: No fever/chills Eyes: No visual changes. ENT: No sore throat. Cardiovascular: Denies chest pain. Respiratory: Denies shortness of breath. Gastrointestinal:  No diarrhea.  No constipation. Genitourinary: Negative for dysuria. Musculoskeletal: Negative for back pain. Skin: Negative for rash. Neurological: Negative for headaches, focal weakness or numbness.  10-point ROS otherwise  negative.  ____________________________________________   PHYSICAL EXAM:  VITAL SIGNS: ED Triage Vitals  Enc Vitals Group     BP 08/02/15 0810 178/76 mmHg     Pulse --      Resp 08/02/15 0810 22     Temp 08/02/15 0810 98.1 F (36.7 C)     Temp Source 08/02/15 0810 Oral     SpO2 08/02/15 0810 99 %     Weight 08/02/15 0810 300 lb (136.079 kg)     Height 08/02/15 0810 6' (1.829 m)     Head Cir --      Peak Flow --      Pain Score 08/02/15 0806 8     Pain Loc --      Pain Edu? --      Excl. in [place]? --     Constitutional: Alert and oriented. Well appearing and in no acute distress. Eyes: Conjunctivae are normal. PERRL. EOMI. Head: Atraumatic. Nose: No congestion/rhinnorhea. Mouth/Throat: Mucous membranes are moist.  Oropharynx non-erythematous. Neck: No stridor.   Cardiovascular: Normal rate, regular rhythm. Grossly normal heart sounds.  Good peripheral circulation. Respiratory: Normal respiratory effort.  No retractions. Lungs CTAB. Gastrointestinal: Soft with right upper quadrant as well as epigastric tenderness palpation. There is no rebound or guarding. There is negative Murphy sign.. No distention. No abdominal bruits. No CVA tenderness. Musculoskeletal: No lower extremity tenderness nor edema.  No joint effusions. Neurologic:  Normal speech and language. No gross focal neurologic deficits are appreciated. No gait instability. Skin:  Skin is warm, dry and intact. No rash noted. Psychiatric: Mood and affect are normal. Speech and behavior are normal.  ____________________________________________  LABS (all labs ordered are listed, but only abnormal results are displayed)  Labs Reviewed  CBC WITH DIFFERENTIAL/PLATELET - Abnormal; Notable for the following:    WBC 14.7 (*)    Neutro Abs 12.4 (*)    All other components within normal limits  COMPREHENSIVE METABOLIC PANEL - Abnormal; Notable for the following:    Sodium 134 (*)    CO2 21 (*)    Glucose, Bld 296 (*)     BUN 33 (*)    Creatinine, Ser 2.19 (*)    Alkaline Phosphatase 179 (*)    Total Bilirubin 1.3 (*)    GFR calc non Af Amer 28 (*)    GFR calc Af Amer 33 (*)    All other components within normal limits  TROPONIN I - Abnormal; Notable for the following:    Troponin I 0.25 (*)    All other components within normal limits  LIPASE, BLOOD  URINALYSIS COMPLETEWITH MICROSCOPIC ([facility] ONLY)   ____________________________________________  EKG  ED ECG REPORT I, Doran Stabler, the attending physician, personally viewed and interpreted this ECG.   Date: 08/02/2015  EKG Time: 919  Rate: 85  Rhythm: normal sinus rhythm  Axis: Left axis deviation  Intervals:none  ST&T Change: No ST segment elevation or depression. T-wave inversion in aVL. ____________________________________________  RADIOLOGY  Chest the right upper quadrant with a small amount of sludge in the gallbladder.  Chest x-ray with low lung volumes with bibasilar atelectasis. Probable bilateral small pleural effusions. I personally reviewed this film.   ____________________________________________   PROCEDURES  CRITICAL CARE Performed by: Doran Stabler   Total critical care time: 35 minutes  Critical care time was exclusive of separately billable procedures and treating other patients.  Critical care was necessary to treat or prevent imminent or life-threatening deterioration.  Critical care was time spent personally by me on the following activities: development of treatment plan with patient and/or surrogate as well as nursing, discussions with consultants, evaluation of patient's response to treatment, examination of patient, obtaining history from patient or surrogate, ordering and performing treatments and interventions, ordering and review of laboratory studies, ordering and review of radiographic studies, pulse oximetry and re-evaluation of patient's condition.  Troponin of 0.25. Troponins on past  blood work have been normal as per the care everywhere record ____________________________________________   [place] / [place] / ED COURSE  Pertinent labs & imaging results that were available during my care of the patient were reviewed by me and considered in my medical decision making (see chart for details).  ----------------------------------------- 10:24 AM on 08/02/2015 -----------------------------------------  Labs as well as imaging results were reviewed with the patient as well as his son who is at the bedside. The patient is now pain-free after morphine. I discussed with them the results of the blood work and that the patient was likely having damage to his heart. We discussed the requirement for admission and also blood thinners. The patient denies any blood in his vomit or stool. I also discussed case with Dr. Saralyn Pilar who is aware of the plan and will see the patient in consult. Signed out to Dr. Genia Harold. ____________________________________________   FINAL CLINICAL IMPRESSION(S) / ED DIAGNOSES  Acute upper abdominal pain with vomiting. Acute Non-STEMI. Initial visit.    Orbie Pyo, MD 08/02/15 [phone]

## 2015-08-03 ENCOUNTER — Inpatient Hospital Stay: Payer: Medicare Other

## 2015-08-03 ENCOUNTER — Encounter: Payer: Self-pay | Admitting: Gastroenterology

## 2015-08-03 LAB — COMPREHENSIVE METABOLIC PANEL
ALT: 13 U/L — ABNORMAL LOW (ref 17–63)
AST: 17 U/L (ref 15–41)
Albumin: 2.9 g/dL — ABNORMAL LOW (ref 3.5–5.0)
Alkaline Phosphatase: 109 U/L (ref 38–126)
Anion gap: 6 (ref 5–15)
BILIRUBIN TOTAL: 0.9 mg/dL (ref 0.3–1.2)
BUN: 37 mg/dL — AB (ref 6–20)
CO2: 20 mmol/L — ABNORMAL LOW (ref 22–32)
Calcium: 8.1 mg/dL — ABNORMAL LOW (ref 8.9–10.3)
Chloride: 109 mmol/L (ref 101–111)
Creatinine, Ser: 2.54 mg/dL — ABNORMAL HIGH (ref 0.61–1.24)
GFR, EST AFRICAN AMERICAN: 27 mL/min — AB (ref >60)
GFR, EST NON AFRICAN AMERICAN: 24 mL/min — AB (ref >60)
Glucose, Bld: 209 mg/dL — ABNORMAL HIGH (ref 65–99)
POTASSIUM: 4.1 mmol/L (ref 3.5–5.1)
Sodium: 135 mmol/L (ref 135–145)
TOTAL PROTEIN: 5.7 g/dL — AB (ref 6.5–8.1)

## 2015-08-03 LAB — HEMOGLOBIN A1C: HEMOGLOBIN A1C: 9.1 % — AB (ref 4.0–6.0)

## 2015-08-03 LAB — GLUCOSE, CAPILLARY
GLUCOSE-CAPILLARY: 208 mg/dL — AB (ref 65–99)
GLUCOSE-CAPILLARY: 195 mg/dL — AB (ref 65–99)
Glucose-Capillary: 262 mg/dL — ABNORMAL HIGH (ref 65–99)
Glucose-Capillary: 175 mg/dL — ABNORMAL HIGH (ref 65–99)

## 2015-08-03 LAB — CBC
HEMATOCRIT: 36.7 % — AB (ref 40.0–52.0)
Hemoglobin: 12 g/dL — ABNORMAL LOW (ref 13.0–18.0)
MCH: 29.5 pg (ref 26.0–34.0)
MCHC: 32.8 g/dL (ref 32.0–36.0)
MCV: 90.1 fL (ref 80.0–100.0)
PLATELETS: 228 10*3/uL (ref 150–440)
RBC: 4.08 MIL/uL — ABNORMAL LOW (ref 4.40–5.90)
RDW: 13.3 % (ref 11.5–14.5)
WBC: 10.9 10*3/uL — AB (ref 3.8–10.6)

## 2015-08-03 LAB — TROPONIN I: Troponin I: 0.11 ng/mL — ABNORMAL HIGH (ref <0.031)

## 2015-08-03 MED ORDER — TECHNETIUM TC 99M DISOFENIN: 5.0000 | Freq: Once | Status: DC | PRN

## 2015-08-03 MED ORDER — TECHNETIUM TC 99M MEBROFENIN IV KIT: 5.3970 | PACK | Freq: Once | INTRAVENOUS | Status: DC | PRN

## 2015-08-03 NOTE — Evaluation (Signed)
Physical Therapy Evaluation Patient Details Name: Cory Miller MRN: [id number] DOB: Feb 13, 1941 Today's Date: 08/03/2015   History of Present Illness  Pt is a 74 y.o. male presenting to hospital with abdominal pain (epigastric and R and lower upper side); pt with elevated troponin and per cardiology likely d/t demand ischemia (troponin peak at 0.17 and downtrending).  PMH includes:  DM, HTN, prostate CA, gout, neuropathy, CAD/CABG, OSA, B carotid stenosis, CKD, L CEA.    Clinical Impression  Currently pt demonstrates SOB with activity and is at risk for decline in functional mobility during hospital stay.  Prior to admission, pt was independent without AD (pt sat to do dishes d/t chronic back pain) and lives alone; pt reports getting SOB walking to mailbox and back at baseline.  Pt was initially CGA during session but progressed to SBA with transfers and ambulation without AD.  Recommend pt discharge to home when medically appropriate.     Follow Up Recommendations No PT follow up (upon discharge from hospital)    Equipment Recommendations  None recommended by PT    Recommendations for Other Services       Precautions / Restrictions Precautions Precautions: Fall Restrictions Weight Bearing Restrictions: No      Mobility  Bed Mobility Overal bed mobility: Needs Assistance Bed Mobility: Supine to Sit;Sit to Supine     Supine to sit: Supervision (use of siderail) Sit to supine: Supervision   General bed mobility comments: increased time to perform but able to perform without physical assist  Transfers Overall transfer level: Needs assistance Equipment used: None Transfers: Sit to/from Stand;Stand Pivot Transfers Sit to Stand: Supervision Stand pivot transfers: Supervision (transfer to toilet)       General transfer comment: no loss of balance noted  Ambulation/Gait Ambulation/Gait assistance: Supervision;Min guard Ambulation Distance (Feet): 140 Feet Assistive  device: None Gait Pattern/deviations: WFL(Within Functional Limits)     General Gait Details: SOB noted with activity/distance  Stairs            Wheelchair Mobility    Modified Rankin (Stroke Patients Only)       Balance                                 Standardized Balance Assessment Standardized Balance Assessment :  (Tinetti Balance Assessment:  26/28 (used hands for standing and sitting) indicating pt is at low risk for falls)           Pertinent Vitals/Pain Pain Assessment: No/denies pain  Vitals stable and WFL throughout treatment session. See flowsheet for vital information.    Home Living Family/patient expects to be discharged to:: Private residence Living Arrangements: Alone   Type of Home: Mobile home Home Access: Stairs to enter Entrance Stairs-Rails: None Entrance Stairs-Number of Steps: 3 plus 1 no rail Home Layout: One level Home Equipment: Cane - single point      Prior Function Level of Independence: Independent         Comments: Sits to do dishes d/t chronic back pain     Hand Dominance        Extremity/Trunk Assessment   Upper Extremity Assessment: Overall WFL for tasks assessed           Lower Extremity Assessment: Overall WFL for tasks assessed         Communication   Communication: 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  Pt agreeable to PT session.    Exercises        Assessment/Plan    PT Assessment Patient needs continued PT services  PT Diagnosis Difficulty walking   PT Problem List Decreased balance;Decreased mobility  PT Treatment Interventions DME instruction;Gait training;Stair  training;Functional mobility training;Therapeutic activities;Therapeutic exercise;Balance training;Patient/family education   PT Goals (Current goals can be found in the Care Plan section) Acute Rehab PT Goals Patient Stated Goal: To get some exercise PT Goal Formulation: With patient Time For Goal Achievement: 08/17/15 Potential to Achieve Goals: Good    Frequency Min 2X/week   Barriers to discharge        Co-evaluation               End of Session Equipment Utilized During Treatment: Gait belt Activity Tolerance: Patient tolerated treatment well (although some SOB noted with activity) Patient left: in bed;with call bell/phone within reach;with bed alarm set;with nursing/sitter in room;with SCD's reapplied Nurse Communication: Mobility status         Time: 1025-1050 PT Time Calculation (min) (ACUTE ONLY): 25 min   Charges:   PT Evaluation $Initial PT Evaluation Tier I: 1 Procedure     PT G CodesRaquel Sarna Jasani Dolney 2015-08-11, 11:16 AM Leitha Bleak, [place]

## 2015-08-03 NOTE — Progress Notes (Signed)
Per Dr. Manuella Ghazi, ok for patient to be put on regular diet.

## 2015-08-03 NOTE — Progress Notes (Signed)
RN called Sunday Spillers, family member to tell family the time of patients test and how long it will last. RN also tried to call second family member that called in this morning, but no one answered and RN was unable to leave a voice message, as it was full.

## 2015-08-03 NOTE — Progress Notes (Signed)
Surgery  HIDA reviewed Normal study Recommend EGD with GI medicine  I have not further recommendations.

## 2015-08-03 NOTE — Clinical Social Work Note (Signed)
Clinical Social Work Assessment  Patient Details  Name: Cory Miller MRN: [id number] Date of Birth: 30-Jul-1941  Date of referral:  08/03/15               Reason for consult:  Other (Comment Required) (Lives alone)                Permission sought to share information with:  Facility Art therapist granted to share information::  Yes, Verbal Permission Granted  Name::        Agency::  SNF in [place]   Relationship::     Contact Information:     Housing/Transportation Living arrangements for the past 2 months:  Single Family Home Source of Information:  Patient Patient Interpreter Needed:  None Criminal Activity/Legal Involvement Pertinent to Current Situation/Hospitalization:  No - Comment as needed Significant Relationships:  None Lives with:  Self Do you feel safe going back to the place where you live?  Yes Need for family participation in patient care:  No (Coment)  Care giving concerns:  None expressed at this time by pt.    Social Worker assessment / plan:  CSW spoke to pt he was oriented x4 but deaf in one ear.  He stated that he lived in a trail[place] alone, but he also stated that he was able to walk independently.  He denies having any family in the area to help him.  No current recommendation for SNF.  CSW will continue to follow.   Employment status:  Retired, Disabled (Comment on whether or not currently receiving Disability) Insurance information:  Medicare PT Recommendations:    Information / Referral to community resources:     Patient/Family's Response to care:  Pt stated that he was ok with going to rehab if it was recommended.  Patient/Family's Understanding of and Emotional Response to Diagnosis, Current Treatment, and Prognosis:  Pt verbalized his understanding of SNF and was in agreement with going if that was the recommendation.    Emotional Assessment Appearance:  Appears stated age Attitude/Demeanor/Rapport:   (sleepy ) Affect  (typically observed):   (difficult to assess because it was difficult to understand pt) Orientation:  Oriented to Self, Oriented to Place, Oriented to  Time, Oriented to Situation Alcohol / Substance use:    Psych involvement (Current and /or in the community):  No (Comment)  Discharge Needs  Concerns to be addressed:  Lack of Support Readmission within the last 30 days:  No Current discharge risk:  Physical Impairment Barriers to Discharge:  No Barriers Identified   Mathews Argyle, LCSW 08/03/2015, 10:15 AM

## 2015-08-03 NOTE — Progress Notes (Signed)
Inpatient Diabetes Program Recommendations  AACE/ADA: New Consensus Statement on Inpatient Glycemic Control (2013)  Target Ranges:  Prepandial:   less than 140 mg/dL      Peak postprandial:   less than 180 mg/dL (1-2 hours)      Critically ill patients:  140 - 180 mg/dL   Results for Cory Miller, POOLE (MRN [id number]) as of 08/03/2015 08:57  Ref. Range 08/02/2015 17:12 08/02/2015 20:16 08/03/2015 07:55  Glucose-Capillary Latest Ref Range: 65-99 mg/dL 241 (H) 277 (H) 208 (H)    Assessment of Glycemic Control; A1C 9.1%  Diabetes history: Type 2 Outpatient Diabetes medications: none known Current orders for Inpatient glycemic control: Novolog 0-20 units tid and 0-5 units qhs  NPO noted. Consider starting the patient on low dose basal insulin  0.1unit/kg;  Lantus 14 units qhs  [name], RN, [place], [place], CDE Diabetes Coordinator Inpatient Diabetes Program  [phone] (Team Pager) [phone] ([place]) 08/03/2015 9:04 AM

## 2015-08-03 NOTE — Plan of Care (Signed)
Problem: Phase II Progression Outcomes Goal: IV changed to normal saline lock Outcome: Progressing Patient still currently receiving IV fluids.

## 2015-08-03 NOTE — Progress Notes (Addendum)
Patients family requesting to speak with Dr. Manuella Ghazi; Dr. Manuella Ghazi paged, made aware and will call into patients room. Patients family also asking to speak with Dr. Candace Cruise, from GI. MD paged and am awaiting call back at this time.   4:52 PM: Dr. Candace Cruise called back and is out of town and in meetings, unable to call into room. Asked that RN let family know he will speak with them tomorrow.

## 2015-08-03 NOTE — Progress Notes (Signed)
[facility] at [place] NAME: Dacorian Planty    MR#:  [id number]  DATE OF BIRTH:  05-04-1941  SUBJECTIVE:  CHIEF COMPLAINT:   Chief Complaint  Patient presents with  . Abdominal Pain   still reports having abdominal pain, waiting for HIDA scan REVIEW OF SYSTEMS:  Review of Systems  Constitutional: Negative for fever, weight loss, malaise/fatigue and diaphoresis.  HENT: Negative for ear discharge, ear pain, hearing loss, nosebleeds, sore throat and tinnitus.   Eyes: Negative for blurred vision and pain.  Respiratory: Negative for cough, hemoptysis, shortness of breath and wheezing.   Cardiovascular: Negative for chest pain, palpitations, orthopnea and leg swelling.  Gastrointestinal: Positive for abdominal pain. Negative for heartburn, nausea, vomiting, diarrhea, constipation and blood in stool.  Genitourinary: Negative for dysuria, urgency and frequency.  Musculoskeletal: Negative for myalgias and back pain.  Skin: Negative for itching and rash.  Neurological: Negative for dizziness, tingling, tremors, focal weakness, seizures, weakness and headaches.  Psychiatric/Behavioral: Negative for depression. The patient is not nervous/anxious.    DRUG ALLERGIES:  No Known Allergies VITALS:  Blood pressure 142/54, pulse 65, temperature 98 F (36.7 C), temperature source Oral, resp. rate 20, height 6' (1.829 m), weight 136.079 kg (300 lb), SpO2 100 %. PHYSICAL EXAMINATION:  Physical Exam  Constitutional: He is oriented to person, place, and time and well-developed, well-nourished, and in no distress.  HENT:  Head: Normocephalic and atraumatic.  Eyes: Conjunctivae and EOM are normal. Pupils are equal, round, and reactive to light.  Neck: Normal range of motion. Neck supple. No tracheal deviation present. No thyromegaly present.  Cardiovascular: Normal rate, regular rhythm and normal heart sounds.   Pulmonary/Chest: Effort normal and breath  sounds normal. No respiratory distress. He has no wheezes. He exhibits no tenderness.  Abdominal: Soft. Bowel sounds are normal. He exhibits no distension. There is tenderness in the right upper quadrant and epigastric area.  Musculoskeletal: Normal range of motion.  Neurological: He is alert and oriented to person, place, and time. No cranial nerve deficit.  Skin: Skin is warm and dry. No rash noted.  Psychiatric: Mood and affect normal.   LABORATORY PANEL:   CBC  Recent Labs Lab 08/03/15 0245  WBC 10.9*  HGB 12.0*  HCT 36.7*  PLT 228   ------------------------------------------------------------------------------------------------------------------ Chemistries   Recent Labs Lab 08/03/15 0245  NA 135  K 4.1  CL 109  CO2 20*  GLUCOSE 209*  BUN 37*  CREATININE 2.54*  CALCIUM 8.1*  AST 17  ALT 13*  ALKPHOS 109  BILITOT 0.9   RADIOLOGY:  Dg Chest 1 View  08/03/2015   CLINICAL DATA:  Pneumonia.  EXAM: CHEST  1 VIEW  COMPARISON:  08/02/2015 .  FINDINGS: Prior CABG. Cardiomegaly with normal pulmonary vascularity. Low lung volumes with bibasilar atelectasis. No evidence of prominent pleural effusion. No pneumothorax . Old left-sided rib fractures present.  IMPRESSION: 1. Prior CABG.  Stable cardiomegaly. 2. Low lung volumes with bibasilar atelectasis again noted. No significant interim change.   Electronically Signed   By: Marcello Moores  Register   On: 08/03/2015 07:26   ASSESSMENT AND PLAN:  74 year old male with a history of CAD/CABG, hypertension, cad and obesity who presents with epigastric pain and found to have elevated troponin level.  1. Epigastric pain with significant tenderness left upper quadrant: CT noncontrast shows mild bilateral anterior lower abdominal/pelvic subcutaneous stranding of uncertain chronicity. Abdominal ultrasound shows sludge but no gallstones and no evidence of acute cholecystitis  or cholangitis.  Patient's surgical and GI input.  Recommend HIDA scan. If  positive, then needs GB surgery. If neg, then will need EGD. However, needs to be off plavix for min 3-5 days per GI  2. Elevated troponin: due to Supply demand ischemia.  No MI.  Appreciate cardiology input  3. Accelerated essential hypertension: This is likely due to pain. Continue Norvasc, clonidine, hydralazine, Imdur and metoprolol.  hydralazine when necessary  4. Type 2 diabetes: continue sliding scale insulin and ADA diet. hemoglobin A1c 9.1 Diabetes coordinator input appreciated   5. Chronic kidney disease stage III: Patient's creatinine is at baseline.  6.. Hyperlipidemia: continue Lipitor.   7. Abnormal chest x-ray: This is just atelectasis, no pneumonia seen.    All the records are reviewed and case discussed with Care Management/Social Worker. Management plans discussed with the patient, family and they are in agreement.  CODE STATUS: Full code  TOTAL TIME TAKING CARE OF THIS PATIENT: 35 minutes.   More than 50% of the time was spent in counseling/coordination of care: YES  POSSIBLE D/C IN 1-2 DAYS, DEPENDING ON CLINICAL CONDITION.   [facility], Kaushal Vannice M.D on 08/03/2015 at 12:40 PM  Between 7am to 6pm - Pager - [phone]  After 6pm go to [email or web address] - password [email or web address] [place] [facility]  Office  [phone]  CC:  Primary care physician; [place]

## 2015-08-03 NOTE — Consult Note (Signed)
GI Inpatient Consult Note  Reason for Consult:   Attending Requesting Consult:  History of Present Illness: Kevonn Dilorenzo is a 74 y.o. male with nausea/vomiting and epigastric pain. Some heartburn. CT neg but U/S showed sludge. Troponin elevated. Seen by cardiology already. Not felt to be worrisome.   Past Medical History:  Past Medical History  Diagnosis Date  . Diabetes mellitus without complication   . Hypertension   . Prostate ca   . Gout   . Neuropathy   . CAD (coronary artery disease) of artery bypass graft     Problem List: Patient Active Problem List   Diagnosis Date Noted  . Abdominal pain 08/02/2015  . Abdominal pain, acute, epigastric   . Pain in the abdomen     Past Surgical History: Past Surgical History  Procedure Laterality Date  . Arterial bypass surgry      Allergies: No Known Allergies  Home Medications: Prescriptions prior to admission  Medication Sig Dispense Refill Last Dose  . amLODipine (NORVASC) 10 MG tablet Take 10 mg by mouth daily.   08/01/2015 at Unknown time  . atorvastatin (LIPITOR) 40 MG tablet Take 40 mg by mouth at bedtime.   08/01/2015 at Unknown time  . cloNIDine (CATAPRES) 0.2 MG tablet Take 0.2 mg by mouth 2 (two) times daily.   08/01/2015 at Unknown time  . clopidogrel (PLAVIX) 75 MG tablet Take 75 mg by mouth daily.   08/01/2015 at Unknown time  . furosemide (LASIX) 20 MG tablet Take 20 mg by mouth 2 (two) times daily.   08/01/2015 at Unknown time  . hydrALAZINE (APRESOLINE) 10 MG tablet Take 10 mg by mouth 4 (four) times daily.   08/01/2015 at Unknown time  . hydrOXYzine (ATARAX/VISTARIL) 25 MG tablet Take 25 mg by mouth every 6 (six) hours as needed for itching.   08/01/2015 at Unknown time  . isosorbide mononitrate (IMDUR) 120 MG 24 hr tablet Take 120 mg by mouth daily.   08/01/2015 at Unknown time  . metoprolol succinate (TOPROL-XL) 100 MG 24 hr tablet Take 100 mg by mouth daily.   08/01/2015 at 1200  . oxyCODONE-acetaminophen  (PERCOCET/ROXICET) 5-325 MG per tablet Take 1 tablet by mouth 3 (three) times daily.   08/01/2015 at 1200  . triamcinolone cream (KENALOG) 0.1 % Apply 1 application topically 2 (two) times daily.   08/01/2015 at Unknown time   Home medication reconciliation was completed with the patient.   Scheduled Inpatient Medications:   . amLODipine  10 mg Oral Daily  . atorvastatin  40 mg Oral QHS  . cloNIDine  0.2 mg Oral BID  . enoxaparin (LOVENOX) injection  40 mg Subcutaneous Q12H  . furosemide  20 mg Oral BID  . hydrALAZINE  10 mg Oral QID  . insulin aspart  0-20 Units Subcutaneous TID WC  . insulin aspart  0-5 Units Subcutaneous QHS  . isosorbide mononitrate  120 mg Oral Daily  . metoprolol succinate  100 mg Oral Daily  . oxyCODONE-acetaminophen  1 tablet Oral TID  . pantoprazole (PROTONIX) IV  40 mg Intravenous Q12H  . sodium chloride  3 mL Intravenous Q12H    Continuous Inpatient Infusions:   . sodium chloride 75 mL/hr at 08/03/15 0530    PRN Inpatient Medications:  acetaminophen **OR** acetaminophen, alum & mag hydroxide-simeth, hydrALAZINE, HYDROcodone-acetaminophen, hydrOXYzine, ondansetron **OR** ondansetron (ZOFRAN) IV, senna-docusate  Family History: family history is not on file.  The patient's family history is negative for inflammatory bowel disorders, GI malignancy, or solid  organ transplantation.  Social History:   reports that he has never smoked. He does not have any smokeless tobacco history on file. He reports that he does not drink alcohol. The patient denies ETOH, tobacco, or drug use.   Review of Systems: Constitutional: Weight is stable.  Eyes: No changes in vision. ENT: No oral lesions, sore throat.  GI: see HPI.  Heme/Lymph: No easy bruising.  CV: No chest pain.  GU: No hematuria.  Integumentary: No rashes.  Neuro: No headaches.  Psych: No depression/anxiety.  Endocrine: No heat/cold intolerance.  Allergic/Immunologic: No urticaria.  Resp: No cough, SOB.   Musculoskeletal: No joint swelling.    Physical Examination: BP 145/53 mmHg  Pulse 65  Temp(Src) 97.8 F (36.6 C) (Oral)  Resp 18  Ht 6' (1.829 m)  Wt 136.079 kg (300 lb)  BMI 40.68 kg/m2  SpO2 98% Gen: NAD, alert and oriented x 4 HEENT: PEERLA, EOMI, Neck: supple, no JVD or thyromegaly Chest: CTA bilaterally, no wheezes, crackles, or other adventitious sounds CV: RRR, no m/g/c/r Abd: soft, NT, ND, +BS in all four quadrants; no HSM, guarding, ridigity, or rebound tenderness Ext: no edema, well perfused with 2+ pulses, Skin: no rash or lesions noted Lymph: no LAD  Data: Lab Results  Component Value Date   WBC 10.9* 08/03/2015   HGB 12.0* 08/03/2015   HCT 36.7* 08/03/2015   MCV 90.1 08/03/2015   PLT 228 08/03/2015    Recent Labs Lab 08/02/15 0849 08/02/15 1431 08/03/15 0245  HGB 14.2 14.4 12.0*   Lab Results  Component Value Date   NA 135 08/03/2015   K 4.1 08/03/2015   CL 109 08/03/2015   CO2 20* 08/03/2015   BUN 37* 08/03/2015   CREATININE 2.54* 08/03/2015   Lab Results  Component Value Date   ALT 13* 08/03/2015   AST 17 08/03/2015   ALKPHOS 109 08/03/2015   BILITOT 0.9 08/03/2015    Recent Labs Lab 08/02/15 0849  APTT 25  INR 1.02   Assessment/Plan: Mr. Neubauer is a 74 y.o. male with either PUD or GB disease.  Recommendations: Agree with HIDA scan. If positive, then needs GB surgery. If neg, then will need EGD. However, needs to be off plavix for min 3-5 days.  Thank you for the consult. Please call with questions or concerns.  Angelina Venard, Lupita Dawn, MD

## 2015-08-03 NOTE — Progress Notes (Signed)
Inpatient Diabetes Program Recommendations  AACE/ADA: New Consensus Statement on Inpatient Glycemic Control (2013)  Target Ranges:  Prepandial:   less than 140 mg/dL      Peak postprandial:   less than 180 mg/dL (1-2 hours)      Critically ill patients:  140 - 180 mg/dL   Met with patient at his bedside.  He reports having diabetes for the past 7-10 years.  His diabetes is [facility]- he saw a new MD the last time he visited; he cannot recall her name.  Could not tell me what diabetes medications he takes at home but states it is only one pill. He last saw the doctor in August and was told to schedule his next visit for October.  Tells me he checks blood sugars 1 time per day, in the morning and blood sugars up until last week were 14m/dl- this past week they were 2017mdl.  Did not think A1C has been as high as it is currently.  Gave him the Living well with Diabetes information for discharge info.   JuGentry FitzRN, BA, MHA, CDE Diabetes Coordinator Inpatient Diabetes Program  [phone] Pager) [phone]9/04/2015 12:08 PM

## 2015-08-04 LAB — BASIC METABOLIC PANEL
ANION GAP: 6 (ref 5–15)
BUN: 41 mg/dL — AB (ref 6–20)
CALCIUM: 8 mg/dL — AB (ref 8.9–10.3)
CO2: 20 mmol/L — ABNORMAL LOW (ref 22–32)
Chloride: 113 mmol/L — ABNORMAL HIGH (ref 101–111)
Creatinine, Ser: 2.75 mg/dL — ABNORMAL HIGH (ref 0.61–1.24)
GFR calc Af Amer: 25 mL/min — ABNORMAL LOW (ref >60)
GFR, EST NON AFRICAN AMERICAN: 21 mL/min — AB (ref >60)
Glucose, Bld: 211 mg/dL — ABNORMAL HIGH (ref 65–99)
POTASSIUM: 4.3 mmol/L (ref 3.5–5.1)
SODIUM: 139 mmol/L (ref 135–145)

## 2015-08-04 LAB — GLUCOSE, CAPILLARY
GLUCOSE-CAPILLARY: 186 mg/dL — AB (ref 65–99)
Glucose-Capillary: 264 mg/dL — ABNORMAL HIGH (ref 65–99)
Glucose-Capillary: 131 mg/dL — ABNORMAL HIGH (ref 65–99)
Glucose-Capillary: 174 mg/dL — ABNORMAL HIGH (ref 65–99)

## 2015-08-04 LAB — CBC
HCT: 35.2 % — ABNORMAL LOW (ref 40.0–52.0)
Hemoglobin: 11.8 g/dL — ABNORMAL LOW (ref 13.0–18.0)
MCH: 30.6 pg (ref 26.0–34.0)
MCHC: 33.7 g/dL (ref 32.0–36.0)
MCV: 90.7 fL (ref 80.0–100.0)
PLATELETS: 187 10*3/uL (ref 150–440)
RBC: 3.88 MIL/uL — AB (ref 4.40–5.90)
RDW: 13.2 % (ref 11.5–14.5)
WBC: 8.9 10*3/uL (ref 3.8–10.6)

## 2015-08-04 MED ORDER — SODIUM CHLORIDE 0.9 % IV SOLN: INTRAVENOUS | Status: DC

## 2015-08-04 NOTE — Consult Note (Signed)
  HIDA neg. Still with some GI sxs. Already had breakfast this AM. Will make pt NPO after MN tonight. Will plan EGD tomorrow. Has been off plavix since admission. Discussed and explained situation to family members. thanks

## 2015-08-04 NOTE — Progress Notes (Signed)
Clinical Education officer, museum (CSW) consult received for patient living alone. CSW assessment completed on 08/03/15 and PT is recommending no follow up. Please reconsult if future social work needs arise. CSW signing off.   Blima Rich, [place] [phone]

## 2015-08-04 NOTE — Progress Notes (Addendum)
Inpatient Diabetes Program Recommendations  AACE/ADA: New Consensus Statement on Inpatient Glycemic Control (2013)  Target Ranges:  Prepandial:   less than 140 mg/dL      Peak postprandial:   less than 180 mg/dL (1-2 hours)      Critically ill patients:  140 - 180 mg/dL   Results for Cory Miller, Cory Miller (MRN [id number]) as of 08/04/2015 08:25  Ref. Range 08/03/2015 07:55 08/03/2015 11:02 08/03/2015 16:34 08/03/2015 21:32 08/04/2015 07:19  Glucose-Capillary Latest Ref Range: 65-99 mg/dL 208 (H) 195 (H) 262 (H) 175 (H) 186 (H)    Diabetes history: DM2 Outpatient Diabetes medications: None on current home med list; however in Care Everywhere tab noted [facility] office note for 04/15/15 that patient has Glipizide 5 mg BID and Actos 30 mg daily listed as home DM medications Current orders for Inpatient glycemic control: Novolog 0-20 units TID with meals, Novolog 0-5 units HS  Inpatient Diabetes Program Recommendations Oral Agents: If appropriate for patient, may want to consider ordering Glipizide 5 mg BID as an inpatient to determine effect on glycemic control. Basal insulin: If patient will not be started on Glipizide then recommend ordering low dose basal insulin. Would recommend starting with Levemir 10 units Q24H.   Thanks, Barnie Alderman, RN, MSN, CCRN, CDE Diabetes Coordinator Inpatient Diabetes Program [phone] (Team Pager from [place] to [place]) [phone] ([facility] office) [phone] [facility] office) [phone] [facility] office)

## 2015-08-04 NOTE — Progress Notes (Signed)
Patient being transferred to room 116 for tele dc'd, report called to Gerald Stabs, RN on oncology floor, family present at bedside

## 2015-08-04 NOTE — Plan of Care (Signed)
Problem: Discharge Progression Outcomes Goal: Other Discharge Outcomes/Goals Outcome: Progressing Patient came to our unit late in the shift No c/o pain Patient is a one person assist to bathroom   Patient is hard of hearing  Patient is to be NPO after midnight for EGd scheduled for tomorrow

## 2015-08-04 NOTE — Progress Notes (Deleted)
Pt ambulated around nurses station without oxygen, O2 sats 94%

## 2015-08-04 NOTE — Care Management (Signed)
HIDA scan was negative.  It had been anticipated that if HIDA scan was negative, would need to proceed with EGD but Plavix would need to be held 3-5 days.  It appears that patient has received one dose of Plavix on 9/5. Discussed during progression

## 2015-08-04 NOTE — Care Management Important Message (Signed)
Important Message  Patient Details  Name: Cory Miller MRN: [id number] Date of Birth: Oct 31, 1941   Medicare Important Message Given:  Yes-second notification given    Darius Bump [place] 08/04/2015, 2:11 PM

## 2015-08-04 NOTE — Progress Notes (Signed)
[place] at [place] NAME: Cory Miller    MR#:  [id number]  DATE OF BIRTH:  09/16/1941  SUBJECTIVE:  CHIEF COMPLAINT:   Chief Complaint  Patient presents with  . Abdominal Pain   still reports having abdominal pain, normal HIDA scan, EGD tomorrow am REVIEW OF SYSTEMS:  Review of Systems  Constitutional: Negative for fever, weight loss, malaise/fatigue and diaphoresis.  HENT: Negative for ear discharge, ear pain, hearing loss, nosebleeds, sore throat and tinnitus.   Eyes: Negative for blurred vision and pain.  Respiratory: Negative for cough, hemoptysis, shortness of breath and wheezing.   Cardiovascular: Negative for chest pain, palpitations, orthopnea and leg swelling.  Gastrointestinal: Positive for abdominal pain. Negative for heartburn, nausea, vomiting, diarrhea, constipation and blood in stool.  Genitourinary: Negative for dysuria, urgency and frequency.  Musculoskeletal: Negative for myalgias and back pain.  Skin: Negative for itching and rash.  Neurological: Negative for dizziness, tingling, tremors, focal weakness, seizures, weakness and headaches.  Psychiatric/Behavioral: Negative for depression. The patient is not nervous/anxious.    DRUG ALLERGIES:  No Known Allergies VITALS:  Blood pressure 112/44, pulse 60, temperature 97.9 F (36.6 C), temperature source Oral, resp. rate 18, height 6' (1.829 m), weight 136.079 kg (300 lb), SpO2 97 %. PHYSICAL EXAMINATION:  Physical Exam  Constitutional: He is oriented to person, place, and time and well-developed, well-nourished, and in no distress.  HENT:  Head: Normocephalic and atraumatic.  Eyes: Conjunctivae and EOM are normal. Pupils are equal, round, and reactive to light.  Neck: Normal range of motion. Neck supple. No tracheal deviation present. No thyromegaly present.  Cardiovascular: Normal rate, regular rhythm and normal heart sounds.   Pulmonary/Chest: Effort  normal and breath sounds normal. No respiratory distress. He has no wheezes. He exhibits no tenderness.  Abdominal: Soft. Bowel sounds are normal. He exhibits no distension. There is tenderness in the right upper quadrant and epigastric area.  Musculoskeletal: Normal range of motion.  Neurological: He is alert and oriented to person, place, and time. No cranial nerve deficit.  Skin: Skin is warm and dry. No rash noted.  Psychiatric: Mood and affect normal.   LABORATORY PANEL:   CBC  Recent Labs Lab 08/04/15 0411  WBC 8.9  HGB 11.8*  HCT 35.2*  PLT 187   ------------------------------------------------------------------------------------------------------------------ Chemistries   Recent Labs Lab 08/03/15 0245 08/04/15 0411  NA 135 139  K 4.1 4.3  CL 109 113*  CO2 20* 20*  GLUCOSE 209* 211*  BUN 37* 41*  CREATININE 2.54* 2.75*  CALCIUM 8.1* 8.0*  AST 17  --   ALT 13*  --   ALKPHOS 109  --   BILITOT 0.9  --    RADIOLOGY:  Nm Hepatobiliary Liver Func  08/03/2015   CLINICAL DATA:  History vomiting for 1 day. Patient reports no abdominal pain.  EXAM: NUCLEAR MEDICINE HEPATOBILIARY IMAGING WITH GALLBLADDER EF  TECHNIQUE: Sequential images of the abdomen were obtained out to 60 minutes following intravenous administration of radiopharmaceutical.  RADIOPHARMACEUTICALS:  5.397 mCi Tc-26m Choletec IV  COMPARISON:  None.  FINDINGS: There is normal distribution of radiotracer within the liver, bile ducts and gallbladder. Gallbladder is visualized at 15 minutes. No radiotracer activity is seen within the bowel.  IMPRESSION: No evidence of acute cholecystitis.   Electronically Signed   By: Fidela Salisbury M.D.   On: 08/03/2015 15:19   ASSESSMENT AND PLAN:  74 year old male with a history of CAD/CABG, hypertension, cad and obesity  who presents with epigastric pain and found to have elevated troponin level.  1. Epigastric pain with significant tenderness left upper quadrant:  APPRECIATE surgical and GI input.  HIDA scan  is neg, EGD planned for tomorrow am. D/w dr Candace Cruise  2. Elevated troponin: due to Supply demand ischemia.  No MI.  Appreciate cardiology input  3. Accelerated essential hypertension: This is likely due to pain. Continue Norvasc, clonidine, hydralazine, Imdur and metoprolol.  hydralazine when necessary  4. Type 2 diabetes: continue sliding scale insulin and ADA diet. hemoglobin A1c 9.1 Diabetes coordinator input appreciated   5. Chronic kidney disease stage III: Patient's creatinine is at baseline.  6.. Hyperlipidemia: continue Lipitor.   7. Abnormal chest x-ray: This is just atelectasis, no pneumonia seen.    All the records are reviewed and case discussed with Care Management/Social Worker. Management plans discussed with the patient, family and they are in agreement.  CODE STATUS: Full code  TOTAL TIME TAKING CARE OF THIS PATIENT: 35 minutes.   More than 50% of the time was spent in counseling/coordination of care: YES  POSSIBLE D/C IN 1-2 DAYS, DEPENDING ON CLINICAL CONDITION and EGD findings   [facility], Algie Cales M.D on 08/04/2015 at 2:27 PM  Between 7am to 6pm - Pager - [phone]  After 6pm go to [email or web address] - password [email or web address] [place] [facility]  Office  [phone]  CC:  Primary care physician; [place]

## 2015-08-05 ENCOUNTER — Encounter
Admission: EM | Disposition: A | Discharge status: Home / Self Care | Payer: Self-pay | Source: Home / Self Care | Attending: Internal Medicine

## 2015-08-05 ENCOUNTER — Encounter: Payer: Self-pay | Admitting: *Deleted

## 2015-08-05 ENCOUNTER — Inpatient Hospital Stay: Payer: Medicare Other | Admitting: Anesthesiology

## 2015-08-05 HISTORY — PX: ESOPHAGOGASTRODUODENOSCOPY: SHX5428

## 2015-08-05 LAB — GLUCOSE, CAPILLARY
Glucose-Capillary: 163 mg/dL — ABNORMAL HIGH (ref 65–99)
Glucose-Capillary: 164 mg/dL — ABNORMAL HIGH (ref 65–99)
Glucose-Capillary: 131 mg/dL — ABNORMAL HIGH (ref 65–99)
Glucose-Capillary: 216 mg/dL — ABNORMAL HIGH (ref 65–99)

## 2015-08-05 SURGERY — EGD (ESOPHAGOGASTRODUODENOSCOPY)
Anesthesia: General

## 2015-08-05 MED ORDER — FENTANYL CITRATE (PF) 100 MCG/2ML IJ SOLN
25.0000 ug | INTRAMUSCULAR | Status: DC | PRN
Start: 2015-08-05 — End: 2015-08-06

## 2015-08-05 MED ORDER — ONDANSETRON HCL 4 MG/2ML IJ SOLN: 4.0000 mg | Freq: Once | INTRAMUSCULAR | Status: DC | PRN

## 2015-08-05 MED ORDER — INSULIN DETEMIR 100 UNIT/ML ~~LOC~~ SOLN
10.0000 [IU] | Freq: Every day | SUBCUTANEOUS | Status: DC
  Administered 2015-08-05: 22:00:00 10 [IU] via SUBCUTANEOUS
  Filled 2015-08-05 (×2): qty 0.1

## 2015-08-05 NOTE — Op Note (Signed)
[facility] Gastroenterology Patient Name: Cory Miller Procedure Date: 08/05/2015 3:00 PM MRN: [id number] Account #: [id number] Date of Birth: Oct 14, 1941 Admit Type: Inpatient Age: 74 Room: [facility] ENDO ROOM 4 Gender: Male Note Status: Finalized Procedure:         Upper GI endoscopy Indications:       Epigastric abdominal pain, Nausea, - HIDA scan Providers:         Lupita Dawn. Candace Cruise, MD Medicines:         Monitored Anesthesia Care Complications:     No immediate complications. Procedure:         Pre-Anesthesia Assessment:                    - Prior to the procedure, a History and Physical was                     performed, and patient medications, allergies and                     sensitivities were reviewed. The patient's tolerance of                     previous anesthesia was reviewed.                    - The risks and benefits of the procedure and the sedation                     options and risks were discussed with the patient. All                     questions were answered and informed consent was obtained.                    - After reviewing the risks and benefits, the patient was                     deemed in satisfactory condition to undergo the procedure.                    After obtaining informed consent, the endoscope was passed                     under direct vision. Throughout the procedure, the                     patient's blood pressure, pulse, and oxygen saturations                     were monitored continuously. The Olympus GIF-160 endoscope                     (S#. [id number]) was introduced through the mouth, and                     advanced to the second part of duodenum. The upper GI                     endoscopy was accomplished without difficulty. The patient                     tolerated the procedure well. Findings:      One superficial esophageal ulcer was found at the gastroesophageal       junction.  LA Grade B (one or more  mucosal breaks greater than 5 mm, not extending       between the tops of two mucosal folds) esophagitis was found in the       lower third of the esophagus. Due to recent plavix use, no biopsy taken.      The exam was otherwise without abnormality.      The entire examined stomach was normal.      Localized mildly erythematous mucosa was found in the first part of the       duodenum.      The exam was otherwise without abnormality. Impression:        - Esophageal ulcer.                    - LA Grade B reflux esophagitis.                    - The examination was otherwise normal.                    - Normal stomach.                    - Erythematous duodenopathy.                    - The examination was otherwise normal.                    - No specimens collected. Recommendation:    - Observe patient's clinical course.                    - Continue present medications.                    - The findings and recommendations were discussed with the                     patient. Procedure Code(s): --- Professional ---                    [phone], Esophagogastroduodenoscopy, flexible, transoral;                     diagnostic, including collection of specimen(s) by                     brushing or washing, when performed (separate procedure) Diagnosis Code(s): --- Professional ---                    K22.10, Ulcer of esophagus without bleeding                    K21.0, Gastro-esophageal reflux disease with esophagitis                    K31.89, Other diseases of stomach and duodenum                    R10.13, Epigastric pain                    R11.0, Nausea CPT copyright 2014 American Medical Association. All rights reserved. The codes documented in this report are preliminary and upon coder review may  be revised to meet current compliance requirements. Hulen Luster, MD 08/05/2015 3:11:52 PM This report has been signed electronically. Number of Addenda: 0 Note Initiated On: 08/05/2015 3:00  PM       [facility]

## 2015-08-05 NOTE — Op Note (Signed)
EGD showed reflux esophagitis with esophageal ulcer. Mild duodenitis also seen. Bx not taken due to recent plavix use. Plavix can be resumed tomorrow if necessary. Recommend PPI BID for 1 month minimum and daily PPI afterwards. Reg diet ordered. Will sign off. Thanks.

## 2015-08-05 NOTE — Progress Notes (Signed)
Called Dr Brigitte Pulse patient had a 1 cm by 1.2 cm on buttocks from where he said he had picked off a scab last week, per Dr Brigitte Pulse cleans and put foam dressing on it.  Continue to monitor

## 2015-08-05 NOTE — Progress Notes (Signed)
[place] at [place] NAME: Cory Miller    MR#:  [id number]  DATE OF BIRTH:  06-06-1941  SUBJECTIVE:  CHIEF COMPLAINT:   Chief Complaint  Patient presents with  . Abdominal Pain  EGD today, NPO, pleasant REVIEW OF SYSTEMS:  Review of Systems  Constitutional: Negative for fever, weight loss, malaise/fatigue and diaphoresis.  HENT: Negative for ear discharge, ear pain, hearing loss, nosebleeds, sore throat and tinnitus.   Eyes: Negative for blurred vision and pain.  Respiratory: Negative for cough, hemoptysis, shortness of breath and wheezing.   Cardiovascular: Negative for chest pain, palpitations, orthopnea and leg swelling.  Gastrointestinal: Positive for abdominal pain. Negative for heartburn, nausea, vomiting, diarrhea, constipation and blood in stool.  Genitourinary: Negative for dysuria, urgency and frequency.  Musculoskeletal: Negative for myalgias and back pain.  Skin: Negative for itching and rash.  Neurological: Negative for dizziness, tingling, tremors, focal weakness, seizures, weakness and headaches.  Psychiatric/Behavioral: Negative for depression. The patient is not nervous/anxious.    DRUG ALLERGIES:  No Known Allergies VITALS:  Blood pressure 150/48, pulse 55, temperature 97.4 F (36.3 C), temperature source Oral, resp. rate 17, height 6' (1.829 m), weight 136.079 kg (300 lb), SpO2 99 %. PHYSICAL EXAMINATION:  Physical Exam  Constitutional: He is oriented to person, place, and time and well-developed, well-nourished, and in no distress.  HENT:  Head: Normocephalic and atraumatic.  Eyes: Conjunctivae and EOM are normal. Pupils are equal, round, and reactive to light.  Neck: Normal range of motion. Neck supple. No tracheal deviation present. No thyromegaly present.  Cardiovascular: Normal rate, regular rhythm and normal heart sounds.   Pulmonary/Chest: Effort normal and breath sounds normal. No respiratory  distress. He has no wheezes. He exhibits no tenderness.  Abdominal: Soft. Bowel sounds are normal. He exhibits no distension. There is tenderness in the right upper quadrant and epigastric area.  Musculoskeletal: Normal range of motion.  Neurological: He is alert and oriented to person, place, and time. No cranial nerve deficit.  Skin: Skin is warm and dry. No rash noted.  Psychiatric: Mood and affect normal.   LABORATORY PANEL:   CBC  Recent Labs Lab 08/04/15 0411  WBC 8.9  HGB 11.8*  HCT 35.2*  PLT 187   ------------------------------------------------------------------------------------------------------------------ Chemistries   Recent Labs Lab 08/03/15 0245 08/04/15 0411  NA 135 139  K 4.1 4.3  CL 109 113*  CO2 20* 20*  GLUCOSE 209* 211*  BUN 37* 41*  CREATININE 2.54* 2.75*  CALCIUM 8.1* 8.0*  AST 17  --   ALT 13*  --   ALKPHOS 109  --   BILITOT 0.9  --    RADIOLOGY:  No results found. ASSESSMENT AND PLAN:  74 year old male with a history of CAD/CABG, hypertension, cad and obesity who presents with epigastric pain and found to have elevated troponin level.  1. Epigastric pain with significant tenderness left upper quadrant: APPRECIATE surgical and GI input.  HIDA scan  is neg, EGD planned for today.  2. Elevated troponin: due to Supply demand ischemia.  No MI.  Appreciate cardiology input  3. Accelerated essential hypertension: This is likely due to pain. Continue Norvasc, clonidine, hydralazine, Imdur and metoprolol.  hydralazine when necessary  4. Type 2 diabetes: continue sliding scale insulin and ADA diet. hemoglobin A1c 9.1 Diabetes coordinator input appreciated   5. Chronic kidney disease stage III: Patient's creatinine is at baseline.  6.. Hyperlipidemia: continue Lipitor.   7. Abnormal chest x-ray: This  is just atelectasis, no pneumonia seen.    All the records are reviewed and case discussed with Care Management/Social Worker. Management  plans discussed with the patient, family and they are in agreement.  CODE STATUS: Full code  TOTAL TIME TAKING CARE OF THIS PATIENT: 35 minutes.   More than 50% of the time was spent in counseling/coordination of care: YES  POSSIBLE D/C IN AM, DEPENDING ON CLINICAL CONDITION and EGD findings   [facility], Talyn Eddie M.D on 08/05/2015 at 8:47 AM  Between 7am to 6pm - Pager - [phone]  After 6pm go to [email or web address] - password [email or web address] [place] [facility]  Office  [phone]  CC:  Primary care physician; [place]

## 2015-08-05 NOTE — Plan of Care (Signed)
Problem: Discharge Progression Outcomes Goal: Other Discharge Outcomes/Goals Outcome: Progressing Patient had no c/o pain this shift Patient c/o gas x1 late in shift relieved by prn Mallox/[place]  VSS Patient diet advanced to regular diet and patient is tolerating diet well] Patient had EGD done and tolerated procedure well PT did work with patient today and her did have some shortness of breath on exhertion but o2 saturation remained above 93%

## 2015-08-05 NOTE — Plan of Care (Addendum)
Problem: Discharge Progression Outcomes Goal: Discharge plan in place and appropriate Outcome: Progressing Individualization of Care Hx of DM, HTN, gout, neuropathy, prostate cancer, HLD, obesity, CAD/CABG, OSA, CKD - stage III, HLD    Goal: Other Discharge Outcomes/Goals Progress toward Goal: 1.  Pain:  Patient had complaints of pain/discomfort in the abdomen primary gas.  Protonix and Maalox given per orders in eMAR.  Patient expressed relief upon reassessment. 2.  Hemodynamically:  Patient afebrile and VSS throughout shift. BP 171/59 mmHg  Pulse 60  Temp(Src) 97.9 F (36.6 C) (Oral)  Resp 17  Ht 6' (1.829 m)  Wt 300 lb (136.079 kg)  BMI 40.68 kg/m2  SpO2 100% 3.  Complications:  Patient is currently NPO for scheduled EGD at 14:00 - 08/05/15. 4.  Diet:  Patient tolerated diet prior to NPO. 5.  Activity:  Patient up to bathroom throughout shift.  Steady on feet with stand-by assist for safety.  Tolerated well.

## 2015-08-05 NOTE — Progress Notes (Signed)
Inpatient Diabetes Program Recommendations  AACE/ADA: New Consensus Statement on Inpatient Glycemic Control (2013)  Target Ranges:  Prepandial:   less than 140 mg/dL      Peak postprandial:   less than 180 mg/dL (1-2 hours)      Critically ill patients:  140 - 180 mg/dL   Diabetes history: DM2  Outpatient Diabetes medications: None on current home med list; however in Care Everywhere tab noted [facility] office note for 04/15/15 that patient has Glipizide 5 mg BID and Actos 30 mg daily listed as home DM medications  Current orders for Inpatient glycemic control: Novolog 0-20 units TID with meals, Novolog 0-5 units HS  Inpatient Diabetes Program Recommendations Oral Agents: If appropriate for patient after his test and once he is eating again, may want to consider ordering Glipizide 5 mg BID as an inpatient to determine effect on glycemic control. Basal insulin: If patient will not be started on Glipizide then recommend ordering low dose basal insulin. Would recommend starting with Levemir 10 units Q24H.    [name], RN, BA, MHA, CDE Diabetes Coordinator Inpatient Diabetes Program  [phone] (Team Pager) [phone] ([place]) 08/05/2015 9:01 AM

## 2015-08-05 NOTE — Anesthesia Preprocedure Evaluation (Signed)
Anesthesia Evaluation  Patient identified by MRN, date of birth, ID band Patient awake    Reviewed: Allergy & Precautions, NPO status , Patient's Chart, lab work & pertinent test results, reviewed documented beta blocker date and time   Airway Mallampati: II  TM Distance: >3 FB Neck ROM: Full    Dental  (+) Chipped   Pulmonary neg pulmonary ROS,    Pulmonary exam normal breath sounds clear to auscultation       Cardiovascular hypertension, Pt. on medications and Pt. on home beta blockers + CAD  Normal cardiovascular exam     Neuro/Psych negative neurological ROS  negative psych ROS   GI/Hepatic negative GI ROS, Neg liver ROS,   Endo/Other  diabetes, Well Controlled, Type 2, Oral Hypoglycemic Agents  Renal/GU negative Renal ROS     Musculoskeletal negative musculoskeletal ROS (+)   Abdominal Normal abdominal exam  (+)   Peds negative pediatric ROS (+)  Hematology negative hematology ROS (+)   Anesthesia Other Findings   Reproductive/Obstetrics                             Anesthesia Physical Anesthesia Plan  ASA: III  Anesthesia Plan: General   Post-op Pain Management:    Induction: Intravenous  Airway Management Planned: Nasal Cannula  Additional Equipment:   Intra-op Plan:   Post-operative Plan:   Informed Consent:   Dental advisory given  Plan Discussed with: CRNA and Surgeon  Anesthesia Plan Comments:         Anesthesia Quick Evaluation

## 2015-08-05 NOTE — Progress Notes (Signed)
Physical Therapy Treatment Patient Details Name: Cory Miller MRN: [id number] DOB: June 11, 1941 Today's Date: 08/05/2015    History of Present Illness Pt is a 74 y.o. male presenting to hospital with abdominal pain (epigastric and R and lower upper side); pt with elevated troponin and per cardiology likely d/t demand ischemia.  PMH includes:  DM, HTN, prostate CA, gout, neuropathy, CAD/CABG, OSA, B carotid stenosis, CKD, L CEA.    PT Comments    Pt requesting to walk 2 laps around nurses station to get some exercise and did well with SBA without any loss of balance.  Some SOB noted with activity but pt's O2 saturations >93% during session on room air.  Pt encouraged to walk in hallway with nursing to increase activity during hospital stay as able.  Follow Up Recommendations  No PT follow up (upon discharge from hospital)     Equipment Recommendations  None recommended by PT    Recommendations for Other Services       Precautions / Restrictions Precautions Precautions: Fall Restrictions Weight Bearing Restrictions: No    Mobility  Bed Mobility Overal bed mobility: Modified Independent Bed Mobility: Supine to Sit;Sit to Supine     Supine to sit: Modified independent (Device/Increase time);HOB elevated Sit to supine: Modified independent (Device/Increase time);HOB elevated      Transfers Overall transfer level: Needs assistance Equipment used: None Transfers: Sit to/from [facility] Sit to Stand: Supervision Stand pivot transfers: Supervision       General transfer comment: no loss of balance noted  Ambulation/Gait Ambulation/Gait assistance: Supervision Ambulation Distance (Feet): 360 Feet Assistive device: None Gait Pattern/deviations: WFL(Within Functional Limits)   Gait velocity interpretation: >2.62 ft/sec, indicative of independent community ambulator General Gait Details: SOB noted with activity/distance   Stairs             Wheelchair Mobility    Modified Rankin (Stroke Patients Only)       Balance Overall balance assessment: Needs assistance Sitting-balance support: No upper extremity supported;Feet supported Sitting balance-Leahy Scale: Normal     Standing balance support: No upper extremity supported Standing balance-Leahy Scale: Good                      Cognition Arousal/Alertness: Awake/alert Behavior During Therapy: WFL for tasks assessed/performed Overall Cognitive Status: Within Functional Limits for tasks assessed                      Exercises  Standing therapeutic exercise x10 B LE AROM with UE support:  Marching (hip flexion), hip abd/adduction, hip extension, hamstring curls, heel-raises.  Pt required vc's and demo for correct technique with ex's.    General Comments   Nursing cleared pt for participation in physical therapy.  Pt agreeable to PT session.      Pertinent Vitals/Pain Pain Assessment: No/denies pain  See flowsheet for HR and O2 vitals.    Home Living                      Prior Function            PT Goals (current goals can now be found in the care plan section) Acute Rehab PT Goals Patient Stated Goal: To get some exercise PT Goal Formulation: With patient Time For Goal Achievement: 08/17/15 Potential to Achieve Goals: Good Progress towards PT goals: Progressing toward goals    Frequency  Min 2X/week    PT Plan Current plan remains appropriate  Co-evaluation             End of Session Equipment Utilized During Treatment: Gait belt Activity Tolerance: Patient tolerated treatment well Patient left: in bed;with call bell/phone within reach;with bed alarm set;with SCD's reapplied     Time: [id number] PT Time Calculation (min) (ACUTE ONLY): 23 min  Charges:  $Therapeutic Exercise: 8-22 mins $Therapeutic Activity: 8-22 mins                    G CodesLeitha Bleak 2015-08-09, 10:54 AM Leitha Bleak,  [place]

## 2015-08-05 NOTE — Anesthesia Postprocedure Evaluation (Signed)
  Anesthesia Post-op Note  Patient: Cory Miller  Procedure(s) Performed: Procedure(s): ESOPHAGOGASTRODUODENOSCOPY (EGD) (N/A)  Anesthesia type:General  Patient location: PACU  Post pain: Pain level controlled  Post assessment: Post-op Vital signs reviewed, Patient's Cardiovascular Status Stable, Respiratory Function Stable, Patent Airway and No signs of Nausea or vomiting  Post vital signs: Reviewed and stable  Last Vitals:  Filed Vitals:   08/05/15 1546  BP: 134/54  Pulse: 54  Temp:   Resp: 21    Level of consciousness: awake, alert  and patient cooperative  Complications: No apparent anesthesia complications

## 2015-08-05 NOTE — Transfer of Care (Signed)
Immediate Anesthesia Transfer of Care Note  Patient: Cory Miller  Procedure(s) Performed: Procedure(s): ESOPHAGOGASTRODUODENOSCOPY (EGD) (N/A)  Patient Location: PACU and Endoscopy Unit  Anesthesia Type:General  Level of Consciousness: lethargic and responds to stimulation  Airway & Oxygen Therapy: Patient Spontanous Breathing and Patient connected to nasal cannula oxygen  Post-op Assessment: Report given to RN and Post -op Vital signs reviewed and stable  Post vital signs: Reviewed and stable  Last Vitals:  Filed Vitals:   08/05/15 1516  BP: 108/47  Pulse: 58  Temp: 36.4 C  Resp: 22    Complications: No apparent anesthesia complications

## 2015-08-06 ENCOUNTER — Encounter: Payer: Self-pay | Admitting: Gastroenterology

## 2015-08-06 LAB — GLUCOSE, CAPILLARY
Glucose-Capillary: 180 mg/dL — ABNORMAL HIGH (ref 65–99)
Glucose-Capillary: 235 mg/dL — ABNORMAL HIGH (ref 65–99)

## 2015-08-06 MED ORDER — SENNOSIDES-DOCUSATE SODIUM 8.6-50 MG PO TABS: 1.0000 | ORAL_TABLET | Freq: Every evening | ORAL | Status: DC | PRN

## 2015-08-06 MED ORDER — PANTOPRAZOLE SODIUM 40 MG PO TBEC: 40.0000 mg | DELAYED_RELEASE_TABLET | Freq: Every day | ORAL | Status: DC

## 2015-08-06 MED ORDER — BISACODYL 10 MG RE SUPP
10.0000 mg | Freq: Once | RECTAL | Status: AC
  Administered 2015-08-06: 11:00:00 10 mg via RECTAL
  Filled 2015-08-06: qty 1

## 2015-08-06 NOTE — Care Management Important Message (Signed)
Important Message  Patient Details  Name: Cory Miller MRN: [id number] Date of Birth: 1941-03-14   Medicare Important Message Given:  Yes-third notification given    Shelbie Ammons, RN 08/06/2015, 9:52 AM

## 2015-08-06 NOTE — Discharge Instructions (Signed)
Esophagitis Esophagitis is inflammation of the esophagus. It can involve swelling, soreness, and pain in the esophagus. This condition can make it difficult and painful to swallow. CAUSES  Most causes of esophagitis are not serious. Many different factors can cause esophagitis, including:  Gastroesophageal reflux disease (GERD). This is when acid from your stomach flows up into the esophagus.  Recurrent vomiting.  An allergic-type reaction.  Certain medicines, especially those that come in large pills.  Ingestion of harmful chemicals, such as household cleaning products.  Heavy alcohol use.  An infection of the esophagus.  Radiation treatment for cancer.  Certain diseases such as sarcoidosis, Crohn's disease, and scleroderma. These diseases may cause recurrent esophagitis. SYMPTOMS   Trouble swallowing.  Painful swallowing.  Chest pain.  Difficulty breathing.  Nausea.  Vomiting.  Abdominal pain. DIAGNOSIS  Your caregiver will take your history and do a physical exam. Depending upon what your caregiver finds, certain tests may also be done, including:  Barium X-ray. You will drink a solution that coats the esophagus, and X-rays will be taken.  Endoscopy. A lighted tube is put down the esophagus so your caregiver can examine the area.  Allergy tests. These can sometimes be arranged through follow-up visits. TREATMENT  Treatment will depend on the cause of your esophagitis. In some cases, steroids or other medicines may be given to help relieve your symptoms or to treat the underlying cause of your condition. Medicines that may be recommended include:  Viscous lidocaine, to soothe the esophagus.  Antacids.  Acid reducers.  Proton pump inhibitors.  Antiviral medicines for certain viral infections of the esophagus.  Antifungal medicines for certain fungal infections of the esophagus.  Antibiotic medicines, depending on the cause of the esophagitis. HOME CARE  INSTRUCTIONS   Avoid foods and drinks that seem to make your symptoms worse.  Eat small, frequent meals instead of large meals.  Avoid eating for the 3 hours prior to your bedtime.  If you have trouble taking pills, use a pill splitter to decrease the size and likelihood of the pill getting stuck or injuring the esophagus on the way down. Drinking water after taking a pill also helps.  Stop smoking if you smoke.  Maintain a healthy weight.  Wear loose-fitting clothing. Do not wear anything tight around your waist that causes pressure on your stomach.  Raise the head of your bed 6 to 8 inches with wood blocks to help you sleep. Extra pillows will not help.  Only take over-the-counter or prescription medicines as directed by your caregiver. SEEK IMMEDIATE MEDICAL CARE IF:  You have severe chest pain that radiates into your arm, neck, or jaw.  You feel sweaty, dizzy, or lightheaded.  You have shortness of breath.  You vomit blood.  You have difficulty or pain with swallowing.  You have bloody or black, tarry stools.  You have a fever.  You have a burning sensation in the chest more than 3 times a week for more than 2 weeks.  You cannot swallow, drink, or eat.  You drool because you cannot swallow your saliva. MAKE SURE YOU:  Understand these instructions.  Will watch your condition.  Will get help right away if you are not doing well or get worse. Document Released: 12/21/2004 Document Revised: 02/05/2012 Document Reviewed: 07/14/2011 [facility] Patient Information 2015 [employer], [place]. This information is not intended to replace advice given to you by your health care provider. Make sure you discuss any questions you have with your health care provider.  Food Choices for Gastroesophageal Reflux Disease When you have gastroesophageal reflux disease (GERD), the foods you eat and your eating habits are very important. Choosing the right foods can help ease your discomfort.   WHAT GUIDELINES DO I NEED TO FOLLOW?   Choose fruits, vegetables, whole grains, and low-fat dairy products.   Choose low-fat meat, fish, and poultry.  Limit fats such as oils, salad dressings, butter, nuts, and avocado.   Keep a food diary. This helps you identify foods that cause symptoms.   Avoid foods that cause symptoms. These may be different for everyone.   Eat small meals often instead of 3 large meals a day.   Eat your meals slowly, in a place where you are relaxed.   Limit fried foods.   Cook foods using methods other than frying.   Avoid drinking alcohol.   Avoid drinking large amounts of liquids with your meals.   Avoid bending over or lying down until 2-3 hours after eating.  WHAT FOODS ARE NOT RECOMMENDED?  These are some foods and drinks that may make your symptoms worse: Vegetables Tomatoes. Tomato juice. Tomato and spaghetti sauce. Chili peppers. Onion and garlic. Horseradish. Fruits Oranges, grapefruit, and lemon (fruit and juice). Meats High-fat meats, fish, and poultry. This includes hot dogs, ribs, ham, sausage, salami, and bacon. Dairy Whole milk and chocolate milk. Sour cream. Cream. Butter. Ice cream. Cream cheese.  Drinks Coffee and tea. Bubbly (carbonated) drinks or energy drinks. Condiments Hot sauce. Barbecue sauce.  Sweets/Desserts Chocolate and cocoa. Donuts. Peppermint and spearmint. Fats and Oils High-fat foods. This includes [place] fries and potato chips. Other Vinegar. Strong spices. This includes black pepper, white pepper, red pepper, cayenne, curry powder, cloves, ginger, and chili powder. The items listed above may not be a complete list of foods and drinks to avoid. Contact your dietitian for more information. Document Released: 05/14/2012 Document Revised: 11/18/2013 Document Reviewed: 09/17/2013 [facility] Patient Information 2015 [place], [place]. This information is not intended to replace advice given to you by your  health care provider. Make sure you discuss any questions you have with your health care provider.

## 2015-08-06 NOTE — Plan of Care (Signed)
Problem: Discharge Progression Outcomes Goal: Other Discharge Outcomes/Goals Plan of care progress to goal - No complaints of pain. - Ambulates in room with assist. - Continues on IV fluids. Will continue to monitor.

## 2015-08-06 NOTE — Progress Notes (Signed)
Pt for discharge home. Alert/ hoh. No resp distress. Denies pain.discharge instructions discussed with pt and pts brother.  meds discussed diet activity and f/u discussed.  Home  Via w/c w/o c/o.

## 2015-08-06 NOTE — Progress Notes (Signed)
Initial Nutrition Assessment   INTERVENTION:    Meals and Snacks: Cater to patient preferences Education: will attempt to educate on nutrition therapy including diabetic nutrition therapy on follow   NUTRITION DIAGNOSIS:   No nutrition diagnosis at this time  GOAL:   Patient will meet greater than or equal to 90% of their needs  MONITOR:    (Energy Intake, Electrolyte and Renal Profile, Digestive System, Glucose Profile)  REASON FOR ASSESSMENT:   LOS    ASSESSMENT:   Pt admitted with abdominal pain with n/v, s/p EGD consistent with reflux esophagitis with esophageal ulcer. Pt unavailable on visit, however family member reports pt discharging today after BM.   Past Medical History  Diagnosis Date  . Diabetes mellitus without complication   . Hypertension   . Prostate ca   . Gout   . Neuropathy   . CAD (coronary artery disease) of artery bypass graft      Diet Order:  Diet regular Room service appropriate?: Yes; Fluid consistency:: Thin    Current Nutrition: Pt ate 100% of lunch today on visit.  Food/Nutrition-Related History: Recorded po intake 80-100% of meals documented since admission.   Medications: Lasix, Novolog, Protonix, NS at 2mL/hr  Electrolyte/Renal Profile and Glucose Profile:   Recent Labs Lab 08/02/15 0849 08/02/15 1431 08/03/15 0245 08/04/15 0411  NA 134*  --  135 139  K 4.5  --  4.1 4.3  CL 104  --  109 113*  CO2 21*  --  20* 20*  BUN 33*  --  37* 41*  CREATININE 2.19* 2.29* 2.54* 2.75*  CALCIUM 9.1  --  8.1* 8.0*  GLUCOSE 296*  --  209* 211*   Protein Profile:  Recent Labs Lab 08/02/15 0849 08/03/15 0245  ALBUMIN 4.1 2.9*    Gastrointestinal Profile: Last BM:  08/06/2015  Weight Change: No weight decrease per MST  Skin:  Reviewed, no issues  Height:   Ht Readings from Last 1 Encounters:  08/05/15 6' (1.829 m)    Weight:   Wt Readings from Last 1 Encounters:  08/05/15 300 lb (136.079 kg)    BMI:  Body mass  index is 40.68 kg/(m^2).   EDUCATION NEEDS:   No education needs identified at this time  [place], [place], LDN Pager [phone]

## 2015-08-06 NOTE — Discharge Summary (Signed)
[place] [facility] [place] NAME: Cory Miller    MR#:  [id number]  DATE OF BIRTH:  [birth year]  DATE OF ADMISSION:  08/02/2015 ADMITTING PHYSICIAN: Bettey Costa, MD  DATE OF DISCHARGE: 08/06/2015  2:20 PM  PRIMARY CARE PHYSICIAN: [place]    ADMISSION DIAGNOSIS:  Pain in the abdomen [R10.9] Non-STEMI (non-ST elevated myocardial infarction) [I21.4] Right upper quadrant abdominal pain [R10.11]  DISCHARGE DIAGNOSIS:  Active Problems:   Abdominal pain   Abdominal pain, acute, epigastric   Pain in the abdomen  SECONDARY DIAGNOSIS:   Past Medical History  Diagnosis Date  . Diabetes mellitus without complication   . Hypertension   . Prostate ca   . Gout   . Neuropathy   . CAD (coronary artery disease) of artery bypass graft    HOSPITAL COURSE:  74 y.o. male with a known history of diabetes, essential hypertension, CAD/CABG and neuropathy admitted for increasing abdominal pain located primarily in the mid epigastric region as well as the right upper and left upper quadrants.  Please see Dr. Trellis Paganini dictated history and physical for further details.  Surgical and GI consultations were obtained for further evaluation.  Surgeon recommended HIDA scan to rule out gallbladder disease.  It was negative.  After which, GI recommended EGD which was performed on 8 of September.   EGD showed reflux esophagitis with esophageal ulcer. Mild duodenitis also seen. Bx not taken due to recent plavix use.  Subsequently, patient was started on regular diet and he tolerated it fine without any difficulty.  He did not have any further nausea.  His pain was much better and was agreeable with the discharge plan. He is being discharged home in stable condition on ninth of September with outpatient follow-up with GI. DISCHARGE CONDITIONS:  stable CONSULTS OBTAINED:  Treatment Team:  Isaias Cowman, MD DRUG ALLERGIES:  No Known  Allergies DISCHARGE MEDICATIONS:   Discharge Medication List as of 08/06/2015 12:55 PM    START taking these medications   Details  pantoprazole (PROTONIX) 40 MG tablet Take 1 tablet (40 mg total) by mouth daily. 40 mg BID for 1 month followed by once daily, Starting 08/06/2015, Until Discontinued, Normal    senna-docusate (SENOKOT-S) 8.6-50 MG per tablet Take 1 tablet by mouth at bedtime as needed for mild constipation., Starting 08/06/2015, Until Discontinued, Normal      CONTINUE these medications which have NOT CHANGED   Details  amLODipine (NORVASC) 10 MG tablet Take 10 mg by mouth daily., Until Discontinued, Historical Med    atorvastatin (LIPITOR) 40 MG tablet Take 40 mg by mouth at bedtime., Until Discontinued, Historical Med    cloNIDine (CATAPRES) 0.2 MG tablet Take 0.2 mg by mouth 2 (two) times daily., Until Discontinued, Historical Med    clopidogrel (PLAVIX) 75 MG tablet Take 75 mg by mouth daily., Until Discontinued, Historical Med    furosemide (LASIX) 20 MG tablet Take 20 mg by mouth 2 (two) times daily., Until Discontinued, Historical Med    hydrALAZINE (APRESOLINE) 10 MG tablet Take 10 mg by mouth 4 (four) times daily., Until Discontinued, Historical Med    hydrOXYzine (ATARAX/VISTARIL) 25 MG tablet Take 25 mg by mouth every 6 (six) hours as needed for itching., Until Discontinued, Historical Med    isosorbide mononitrate (IMDUR) 120 MG 24 hr tablet Take 120 mg by mouth daily., Until Discontinued, Historical Med    metoprolol succinate (TOPROL-XL) 100 MG 24 hr tablet Take 100 mg by  mouth daily., Until Discontinued, Historical Med    oxyCODONE-acetaminophen (PERCOCET/ROXICET) 5-325 MG per tablet Take 1 tablet by mouth 3 (three) times daily., Until Discontinued, Historical Med    triamcinolone cream (KENALOG) 0.1 % Apply 1 application topically 2 (two) times daily., Until Discontinued, Historical Med       DISCHARGE INSTRUCTIONS:   DIET:  Regular diet DISCHARGE  CONDITION:  Good ACTIVITY:  Activity as tolerated OXYGEN:  Home Oxygen: No.  Oxygen Delivery: room air DISCHARGE LOCATION:  home   If you experience worsening of your admission symptoms, develop shortness of breath, life threatening emergency, suicidal or homicidal thoughts you must seek medical attention immediately by calling 911 or calling your MD immediately  if symptoms less severe.  You Must read complete instructions/literature along with all the possible adverse reactions/side effects for all the Medicines you take and that have been prescribed to you. Take any new Medicines after you have completely understood and accpet all the possible adverse reactions/side effects.   Please note  You were cared for by a hospitalist during your hospital stay. If you have any questions about your discharge medications or the care you received while you were in the hospital after you are discharged, you can call the unit and asked to speak with the hospitalist on call if the hospitalist that took care of you is not available. Once you are discharged, your primary care physician will handle any further medical issues. Please note that NO REFILLS for any discharge medications will be authorized once you are discharged, as it is imperative that you return to your primary care physician (or establish a relationship with a primary care physician if you do not have one) for your aftercare needs so that they can reassess your need for medications and monitor your lab values.    On the day of Discharge: VITAL SIGNS:  Blood pressure 155/61, pulse 59, temperature 97.8 F (36.6 C), temperature source Oral, resp. rate 18, height 6' (1.829 m), weight 300 lb (136.079 kg), SpO2 99 %. PHYSICAL EXAMINATION:  GENERAL:  74 y.o.-year-old patient lying in the bed with no acute distress.  EYES: Pupils equal, round, reactive to light and accommodation. No scleral icterus. Extraocular muscles intact.  HEENT: Head  atraumatic, normocephalic. Oropharynx and nasopharynx clear.  NECK:  Supple, no jugular venous distention. No thyroid enlargement, no tenderness.  LUNGS: Normal breath sounds bilaterally, no wheezing, rales,rhonchi or crepitation. No use of accessory muscles of respiration.  CARDIOVASCULAR: S1, S2 normal. No murmurs, rubs, or gallops.  ABDOMEN: Soft, non-tender, non-distended. Bowel sounds present. No organomegaly or mass.  EXTREMITIES: No pedal edema, cyanosis, or clubbing.  NEUROLOGIC: Cranial nerves II through XII are intact. Muscle strength 5/5 in all extremities. Sensation intact. Gait not checked.  PSYCHIATRIC: The patient is alert and oriented x 3.  SKIN: No obvious rash, lesion, or ulcer.  DATA REVIEW:   CBC  Recent Labs Lab 08/04/15 0411  WBC 8.9  HGB 11.8*  HCT 35.2*  PLT 187    Chemistries   Recent Labs Lab 08/03/15 0245 08/04/15 0411  NA 135 139  K 4.1 4.3  CL 109 113*  CO2 20* 20*  GLUCOSE 209* 211*  BUN 37* 41*  CREATININE 2.54* 2.75*  CALCIUM 8.1* 8.0*  AST 17  --   ALT 13*  --   ALKPHOS 109  --   BILITOT 0.9  --     Cardiac Enzymes  Recent Labs Lab 08/03/15 0245  TROPONINI 0.11*  Management plans discussed with the patient, family and they are in agreement.  CODE STATUS: Full code  TOTAL TIME TAKING CARE OF THIS PATIENT: 55 minutes.    [facility], Che Below M.D on 08/06/2015 at 4:23 PM  Between 7am to 6pm - Pager - [phone]  After 6pm go to [email or web address] - password [email or web address]  Office  [phone]  CC: Primary care physician; [facility] Hulen Luster, MD

## 2015-09-29 ENCOUNTER — Encounter: Payer: Self-pay | Admitting: *Deleted

## 2015-09-30 ENCOUNTER — Ambulatory Visit: Payer: Medicare Other | Admitting: Certified Registered Nurse Anesthetist

## 2015-09-30 ENCOUNTER — Ambulatory Visit
Admission: RE | Admit: 2015-09-30 | Discharge: 2015-09-30 | Disposition: A | Discharge status: Home / Self Care | Payer: Medicare Other | Source: Ambulatory Visit | Attending: Gastroenterology | Admitting: Gastroenterology

## 2015-09-30 ENCOUNTER — Encounter: Payer: Self-pay | Admitting: Anesthesiology

## 2015-09-30 ENCOUNTER — Encounter
Admission: RE | Disposition: A | Discharge status: Home / Self Care | Payer: Self-pay | Source: Ambulatory Visit | Attending: Gastroenterology

## 2015-09-30 DIAGNOSIS — N183 Chronic kidney disease, stage 3 (moderate): Secondary | ICD-10-CM | POA: Diagnosis not present

## 2015-09-30 DIAGNOSIS — Z7902 Long term (current) use of antithrombotics/antiplatelets: Secondary | ICD-10-CM | POA: Insufficient documentation

## 2015-09-30 DIAGNOSIS — E782 Mixed hyperlipidemia: Secondary | ICD-10-CM | POA: Insufficient documentation

## 2015-09-30 DIAGNOSIS — Z7984 Long term (current) use of oral hypoglycemic drugs: Secondary | ICD-10-CM | POA: Insufficient documentation

## 2015-09-30 DIAGNOSIS — D649 Anemia, unspecified: Secondary | ICD-10-CM | POA: Diagnosis present

## 2015-09-30 DIAGNOSIS — Z7982 Long term (current) use of aspirin: Secondary | ICD-10-CM | POA: Diagnosis not present

## 2015-09-30 DIAGNOSIS — E669 Obesity, unspecified: Secondary | ICD-10-CM | POA: Diagnosis not present

## 2015-09-30 DIAGNOSIS — Z6835 Body mass index (BMI) 35.0-35.9, adult: Secondary | ICD-10-CM | POA: Diagnosis not present

## 2015-09-30 DIAGNOSIS — G4733 Obstructive sleep apnea (adult) (pediatric): Secondary | ICD-10-CM | POA: Insufficient documentation

## 2015-09-30 DIAGNOSIS — E1122 Type 2 diabetes mellitus with diabetic chronic kidney disease: Secondary | ICD-10-CM | POA: Insufficient documentation

## 2015-09-30 DIAGNOSIS — I6523 Occlusion and stenosis of bilateral carotid arteries: Secondary | ICD-10-CM | POA: Insufficient documentation

## 2015-09-30 DIAGNOSIS — I129 Hypertensive chronic kidney disease with stage 1 through stage 4 chronic kidney disease, or unspecified chronic kidney disease: Secondary | ICD-10-CM | POA: Diagnosis not present

## 2015-09-30 DIAGNOSIS — K210 Gastro-esophageal reflux disease with esophagitis: Secondary | ICD-10-CM | POA: Diagnosis not present

## 2015-09-30 DIAGNOSIS — Z8711 Personal history of peptic ulcer disease: Secondary | ICD-10-CM | POA: Insufficient documentation

## 2015-09-30 DIAGNOSIS — N529 Male erectile dysfunction, unspecified: Secondary | ICD-10-CM | POA: Insufficient documentation

## 2015-09-30 DIAGNOSIS — I252 Old myocardial infarction: Secondary | ICD-10-CM | POA: Insufficient documentation

## 2015-09-30 DIAGNOSIS — Z87891 Personal history of nicotine dependence: Secondary | ICD-10-CM | POA: Insufficient documentation

## 2015-09-30 DIAGNOSIS — Z8673 Personal history of transient ischemic attack (TIA), and cerebral infarction without residual deficits: Secondary | ICD-10-CM | POA: Diagnosis not present

## 2015-09-30 DIAGNOSIS — I2510 Atherosclerotic heart disease of native coronary artery without angina pectoris: Secondary | ICD-10-CM | POA: Diagnosis not present

## 2015-09-30 DIAGNOSIS — R197 Diarrhea, unspecified: Secondary | ICD-10-CM | POA: Diagnosis not present

## 2015-09-30 DIAGNOSIS — K219 Gastro-esophageal reflux disease without esophagitis: Secondary | ICD-10-CM | POA: Insufficient documentation

## 2015-09-30 DIAGNOSIS — Z79899 Other long term (current) drug therapy: Secondary | ICD-10-CM | POA: Diagnosis not present

## 2015-09-30 DIAGNOSIS — R195 Other fecal abnormalities: Secondary | ICD-10-CM | POA: Insufficient documentation

## 2015-09-30 DIAGNOSIS — E1142 Type 2 diabetes mellitus with diabetic polyneuropathy: Secondary | ICD-10-CM | POA: Diagnosis not present

## 2015-09-30 DIAGNOSIS — I739 Peripheral vascular disease, unspecified: Secondary | ICD-10-CM | POA: Diagnosis not present

## 2015-09-30 HISTORY — DX: Ulcer of esophagus without bleeding: K22.10

## 2015-09-30 HISTORY — DX: Hyperlipidemia, unspecified: E78.5

## 2015-09-30 HISTORY — DX: Acute myocardial infarction, unspecified: I21.9

## 2015-09-30 HISTORY — PX: ESOPHAGOGASTRODUODENOSCOPY (EGD) WITH PROPOFOL: SHX5813

## 2015-09-30 HISTORY — DX: Occlusion and stenosis of unspecified carotid artery: I65.29

## 2015-09-30 HISTORY — DX: Cerebral infarction, unspecified: I63.9

## 2015-09-30 HISTORY — DX: Gastro-esophageal reflux disease without esophagitis: K21.9

## 2015-09-30 LAB — GLUCOSE, CAPILLARY: GLUCOSE-CAPILLARY: 156 mg/dL — AB (ref 65–99)

## 2015-09-30 SURGERY — ESOPHAGOGASTRODUODENOSCOPY (EGD) WITH PROPOFOL
Anesthesia: General

## 2015-09-30 MED ORDER — SODIUM CHLORIDE 0.9 % IV SOLN: INTRAVENOUS | Status: DC

## 2015-09-30 MED ORDER — FENTANYL CITRATE (PF) 100 MCG/2ML IJ SOLN
INTRAMUSCULAR | Status: DC | PRN
  Administered 2015-09-30: 50 ug via INTRAVENOUS

## 2015-09-30 MED ORDER — PROPOFOL 10 MG/ML IV BOLUS
INTRAVENOUS | Status: DC | PRN
  Administered 2015-09-30: 50 mg via INTRAVENOUS
  Administered 2015-09-30: 10 mg via INTRAVENOUS

## 2015-09-30 MED ORDER — LIDOCAINE HCL (CARDIAC) 20 MG/ML IV SOLN
INTRAVENOUS | Status: DC | PRN
  Administered 2015-09-30: 100 mg via INTRAVENOUS

## 2015-09-30 MED ORDER — GLYCOPYRROLATE 0.2 MG/ML IJ SOLN
INTRAMUSCULAR | Status: DC | PRN
Start: 2015-09-30 — End: 2015-09-30
  Administered 2015-09-30: 0.1 mg via INTRAVENOUS

## 2015-09-30 MED ORDER — PROPOFOL 500 MG/50ML IV EMUL
INTRAVENOUS | Status: DC | PRN
  Administered 2015-09-30: 120 ug/kg/min via INTRAVENOUS

## 2015-09-30 MED ORDER — SODIUM CHLORIDE 0.9 % IV SOLN
INTRAVENOUS | Status: DC
  Administered 2015-09-30: 1000 mL via INTRAVENOUS

## 2015-09-30 NOTE — Op Note (Signed)
[facility] Gastroenterology Patient Name: Cory Miller Procedure Date: 09/30/2015 11:52 AM MRN: [id number] Account #: [id number] Date of Birth: February 23, 1941 Admit Type: Outpatient Age: 74 Room: [facility] ENDO ROOM 4 Gender: Male Note Status: Finalized Procedure:         Upper GI endoscopy Indications:       Anemia, Heme positive stool, F/U of esophageal ulcer and                     esophagitis. Providers:         Lupita Dawn. Candace Cruise, MD Referring MD:      Tracey Harries, MD (Referring MD) Medicines:         Pt stopped ASA but did not stop plavix for the procedure.                     Monitored Anesthesia Care Complications:     No immediate complications. Procedure:         Pre-Anesthesia Assessment:                    - Prior to the procedure, a History and Physical was                     performed, and patient medications, allergies and                     sensitivities were reviewed. The patient's tolerance of                     previous anesthesia was reviewed.                    - The risks and benefits of the procedure and the sedation                     options and risks were discussed with the patient. All                     questions were answered and informed consent was obtained.                    - After reviewing the risks and benefits, the patient was                     deemed in satisfactory condition to undergo the procedure.                    After obtaining informed consent, the endoscope was passed                     under direct vision. Throughout the procedure, the                     patient's blood pressure, pulse, and oxygen saturations                     were monitored continuously. The Endoscope was introduced                     through the mouth, and advanced to the second part of                     duodenum. The upper GI endoscopy was accomplished without  difficulty. The patient tolerated the procedure  well. Findings:      LA Grade A (one or more mucosal breaks less than 5 mm, not extending       between tops of 2 mucosal folds) esophagitis was found at the       gastroesophageal junction. Ulcer has healed completely.      The exam was otherwise without abnormality.      A medium amount of food (residue) was found in the gastric fundus.      The exam was otherwise without abnormality.      The examined duodenum was normal. Impression:        - LA Grade A reflux esophagitis.                    - The examination was otherwise normal.                    - A medium amount of food (residue) in the stomach. Much                     of the food suctioned out.                    - The examination was otherwise normal.                    - Normal examined duodenum.                    - No specimens collected. Recommendation:    - Discharge patient to home.                    - Observe patient's clinical course.                    - Continue present medications.                    - The findings and recommendations were discussed with the                     patient. Procedure Code(s): --- Professional ---                    [phone], Esophagogastroduodenoscopy, flexible, transoral;                     diagnostic, including collection of specimen(s) by                     brushing or washing, when performed (separate procedure) Diagnosis Code(s): --- Professional ---                    K21.0, Gastro-esophageal reflux disease with esophagitis                    D64.9, Anemia, unspecified                    R19.5, Other fecal abnormalities CPT copyright 2014 American Medical Association. All rights reserved. The codes documented in this report are preliminary and upon coder review may  be revised to meet current compliance requirements. Hulen Luster, MD 09/30/2015 12:03:55 PM This report has been signed electronically. Number of Addenda: 0 Note Initiated On: 09/30/2015 11:52 AM      [facility]

## 2015-09-30 NOTE — Anesthesia Preprocedure Evaluation (Signed)
Anesthesia Evaluation  Patient identified by MRN, date of birth, ID band Patient awake    Reviewed: Allergy & Precautions, NPO status , Patient's Chart, lab work & pertinent test results, reviewed documented beta blocker date and time   Airway Mallampati: II  TM Distance: >3 FB     Dental  (+) Chipped   Pulmonary neg pulmonary ROS,    Pulmonary exam normal        Cardiovascular hypertension, Pt. on medications and Pt. on home beta blockers + CAD, + Past MI and + Peripheral Vascular Disease  Normal cardiovascular exam     Neuro/Psych neuropathy CVA negative psych ROS   GI/Hepatic PUD, GERD  Medicated and Controlled,  Endo/Other  diabetes, Well Controlled, Type 2  Renal/GU      Musculoskeletal negative musculoskeletal ROS (+)   Abdominal Normal abdominal exam  (+)   Peds  Hematology negative hematology ROS (+)   Anesthesia Other Findings obese  Reproductive/Obstetrics                             Anesthesia Physical Anesthesia Plan  ASA: III  Anesthesia Plan: General   Post-op Pain Management:    Induction: Intravenous  Airway Management Planned: Nasal Cannula  Additional Equipment:   Intra-op Plan:   Post-operative Plan:   Informed Consent: I have reviewed the patients History and Physical, chart, labs and discussed the procedure including the risks, benefits and alternatives for the proposed anesthesia with the patient or authorized representative who has indicated his/her understanding and acceptance.   Dental advisory given  Plan Discussed with: CRNA and Surgeon  Anesthesia Plan Comments:         Anesthesia Quick Evaluation

## 2015-09-30 NOTE — Transfer of Care (Signed)
Immediate Anesthesia Transfer of Care Note  Patient: Floy Castrejon  Procedure(s) Performed: Procedure(s): ESOPHAGOGASTRODUODENOSCOPY (EGD) WITH PROPOFOL (N/A)  Patient Location: PACU  Anesthesia Type:General  Level of Consciousness: sedated  Airway & Oxygen Therapy: Patient Spontanous Breathing and Patient connected to nasal cannula oxygen  Post-op Assessment: Report given to RN and Post -op Vital signs reviewed and stable  Post vital signs: Reviewed and stable  Last Vitals:  Filed Vitals:   09/30/15 1059  BP: 182/60  Pulse: 69  Temp: 37.3 C  Resp: 16    Complications: No apparent anesthesia complications

## 2015-09-30 NOTE — H&P (Signed)
  Date of Initial H&P:09/05/2014 History reviewed, patient examined, no change in status, stable for surgery.

## 2015-10-01 NOTE — Anesthesia Postprocedure Evaluation (Signed)
  Anesthesia Post-op Note  Patient: Cory Miller  Procedure(s) Performed: Procedure(s): ESOPHAGOGASTRODUODENOSCOPY (EGD) WITH PROPOFOL (N/A)  Anesthesia type:General  Patient location: PACU  Post pain: Pain level controlled  Post assessment: Post-op Vital signs reviewed, Patient's Cardiovascular Status Stable, Respiratory Function Stable, Patent Airway and No signs of Nausea or vomiting  Post vital signs: Reviewed and stable  Last Vitals:  Filed Vitals:   09/30/15 1240  BP: 157/62  Pulse: 67  Temp:   Resp: 21    Level of consciousness: awake, alert  and patient cooperative  Complications: No apparent anesthesia complications

## 2016-04-03 ENCOUNTER — Emergency Department: Payer: Medicare Other

## 2016-04-03 ENCOUNTER — Inpatient Hospital Stay
Admission: EM | Admit: 2016-04-03 | Discharge: 2016-04-04 | DRG: 392 | Disposition: A | Discharge status: Home / Self Care | Payer: Medicare Other | Attending: Internal Medicine | Admitting: Internal Medicine

## 2016-04-03 ENCOUNTER — Encounter: Payer: Self-pay | Admitting: Emergency Medicine

## 2016-04-03 DIAGNOSIS — K219 Gastro-esophageal reflux disease without esophagitis: Secondary | ICD-10-CM | POA: Diagnosis present

## 2016-04-03 DIAGNOSIS — Z79891 Long term (current) use of opiate analgesic: Secondary | ICD-10-CM

## 2016-04-03 DIAGNOSIS — R140 Abdominal distension (gaseous): Secondary | ICD-10-CM | POA: Diagnosis not present

## 2016-04-03 DIAGNOSIS — Z7902 Long term (current) use of antithrombotics/antiplatelets: Secondary | ICD-10-CM

## 2016-04-03 DIAGNOSIS — I25810 Atherosclerosis of coronary artery bypass graft(s) without angina pectoris: Secondary | ICD-10-CM | POA: Diagnosis present

## 2016-04-03 DIAGNOSIS — R000 Tachycardia, unspecified: Secondary | ICD-10-CM

## 2016-04-03 DIAGNOSIS — I10 Essential (primary) hypertension: Secondary | ICD-10-CM | POA: Diagnosis present

## 2016-04-03 DIAGNOSIS — Z823 Family history of stroke: Secondary | ICD-10-CM

## 2016-04-03 DIAGNOSIS — E785 Hyperlipidemia, unspecified: Secondary | ICD-10-CM | POA: Diagnosis present

## 2016-04-03 DIAGNOSIS — E1140 Type 2 diabetes mellitus with diabetic neuropathy, unspecified: Secondary | ICD-10-CM | POA: Diagnosis present

## 2016-04-03 DIAGNOSIS — N184 Chronic kidney disease, stage 4 (severe): Secondary | ICD-10-CM | POA: Diagnosis present

## 2016-04-03 DIAGNOSIS — I252 Old myocardial infarction: Secondary | ICD-10-CM

## 2016-04-03 DIAGNOSIS — M109 Gout, unspecified: Secondary | ICD-10-CM | POA: Diagnosis present

## 2016-04-03 DIAGNOSIS — H918X9 Other specified hearing loss, unspecified ear: Secondary | ICD-10-CM | POA: Diagnosis present

## 2016-04-03 DIAGNOSIS — A419 Sepsis, unspecified organism: Secondary | ICD-10-CM | POA: Diagnosis present

## 2016-04-03 DIAGNOSIS — N186 End stage renal disease: Secondary | ICD-10-CM

## 2016-04-03 DIAGNOSIS — E1122 Type 2 diabetes mellitus with diabetic chronic kidney disease: Secondary | ICD-10-CM

## 2016-04-03 DIAGNOSIS — R109 Unspecified abdominal pain: Secondary | ICD-10-CM

## 2016-04-03 DIAGNOSIS — Z951 Presence of aortocoronary bypass graft: Secondary | ICD-10-CM

## 2016-04-03 DIAGNOSIS — Z833 Family history of diabetes mellitus: Secondary | ICD-10-CM

## 2016-04-03 DIAGNOSIS — Z8546 Personal history of malignant neoplasm of prostate: Secondary | ICD-10-CM

## 2016-04-03 DIAGNOSIS — I2510 Atherosclerotic heart disease of native coronary artery without angina pectoris: Secondary | ICD-10-CM | POA: Diagnosis present

## 2016-04-03 DIAGNOSIS — Z79899 Other long term (current) drug therapy: Secondary | ICD-10-CM

## 2016-04-03 DIAGNOSIS — Z8673 Personal history of transient ischemic attack (TIA), and cerebral infarction without residual deficits: Secondary | ICD-10-CM

## 2016-04-03 DIAGNOSIS — Z8249 Family history of ischemic heart disease and other diseases of the circulatory system: Secondary | ICD-10-CM

## 2016-04-03 DIAGNOSIS — K5732 Diverticulitis of large intestine without perforation or abscess without bleeding: Secondary | ICD-10-CM | POA: Diagnosis not present

## 2016-04-03 DIAGNOSIS — E119 Type 2 diabetes mellitus without complications: Secondary | ICD-10-CM

## 2016-04-03 DIAGNOSIS — R778 Other specified abnormalities of plasma proteins: Secondary | ICD-10-CM | POA: Diagnosis not present

## 2016-04-03 DIAGNOSIS — I129 Hypertensive chronic kidney disease with stage 1 through stage 4 chronic kidney disease, or unspecified chronic kidney disease: Secondary | ICD-10-CM | POA: Diagnosis present

## 2016-04-03 LAB — URINALYSIS COMPLETE WITH MICROSCOPIC (ARMC ONLY)
BACTERIA UA: NONE SEEN
Bilirubin Urine: NEGATIVE
Glucose, UA: >500 mg/dL — AB
Ketones, ur: NEGATIVE mg/dL
LEUKOCYTES UA: NEGATIVE
Nitrite: NEGATIVE
PH: 6 (ref 5.0–8.0)
SQUAMOUS EPITHELIAL / LPF: NONE SEEN
Specific Gravity, Urine: 1.014 (ref 1.005–1.030)

## 2016-04-03 LAB — COMPREHENSIVE METABOLIC PANEL
ALBUMIN: 4.2 g/dL (ref 3.5–5.0)
ALT: 20 U/L (ref 17–63)
ANION GAP: 13 (ref 5–15)
AST: 28 U/L (ref 15–41)
Alkaline Phosphatase: 185 U/L — ABNORMAL HIGH (ref 38–126)
BUN: 42 mg/dL — AB (ref 6–20)
CHLORIDE: 107 mmol/L (ref 101–111)
CO2: 19 mmol/L — ABNORMAL LOW (ref 22–32)
Calcium: 9.5 mg/dL (ref 8.9–10.3)
Creatinine, Ser: 2.96 mg/dL — ABNORMAL HIGH (ref 0.61–1.24)
GFR calc Af Amer: 22 mL/min — ABNORMAL LOW (ref >60)
GFR calc non Af Amer: 19 mL/min — ABNORMAL LOW (ref >60)
GLUCOSE: 369 mg/dL — AB (ref 65–99)
POTASSIUM: 5.5 mmol/L — AB (ref 3.5–5.1)
SODIUM: 139 mmol/L (ref 135–145)
Total Bilirubin: 0.8 mg/dL (ref 0.3–1.2)
Total Protein: 8.4 g/dL — ABNORMAL HIGH (ref 6.5–8.1)

## 2016-04-03 LAB — CBC
HCT: 41.6 % (ref 40.0–52.0)
Hemoglobin: 13.5 g/dL (ref 13.0–18.0)
MCH: 29.5 pg (ref 26.0–34.0)
MCHC: 32.3 g/dL (ref 32.0–36.0)
MCV: 91.2 fL (ref 80.0–100.0)
Platelets: 267 10*3/uL (ref 150–440)
RBC: 4.56 MIL/uL (ref 4.40–5.90)
RDW: 13.8 % (ref 11.5–14.5)
WBC: 13.5 10*3/uL — AB (ref 3.8–10.6)

## 2016-04-03 LAB — LIPASE, BLOOD: Lipase: 46 U/L (ref 11–51)

## 2016-04-03 LAB — TROPONIN I: Troponin I: 0.06 ng/mL — ABNORMAL HIGH (ref <0.031)

## 2016-04-03 MED ORDER — DIATRIZOATE MEGLUMINE & SODIUM 66-10 % PO SOLN
15.0000 mL | Freq: Once | ORAL | Status: AC
  Administered 2016-04-03: 15 mL via ORAL

## 2016-04-03 MED ORDER — ONDANSETRON HCL 4 MG/2ML IJ SOLN
4.0000 mg | Freq: Once | INTRAMUSCULAR | Status: AC | PRN
  Administered 2016-04-03: 4 mg via INTRAVENOUS

## 2016-04-03 MED ORDER — MORPHINE SULFATE (PF) 4 MG/ML IV SOLN
4.0000 mg | Freq: Once | INTRAVENOUS | Status: AC
  Administered 2016-04-03: 4 mg via INTRAVENOUS
  Filled 2016-04-03: qty 1

## 2016-04-03 MED ORDER — ONDANSETRON HCL 4 MG/2ML IJ SOLN
INTRAMUSCULAR | Status: AC
  Administered 2016-04-03: 4 mg via INTRAVENOUS
  Filled 2016-04-03: qty 2

## 2016-04-03 MED ORDER — ONDANSETRON HCL 4 MG/2ML IJ SOLN
4.0000 mg | Freq: Once | INTRAMUSCULAR | Status: AC
  Administered 2016-04-03: 4 mg via INTRAVENOUS
  Filled 2016-04-03: qty 2

## 2016-04-03 NOTE — ED Notes (Signed)
Patient transported to CT 

## 2016-04-03 NOTE — ED Notes (Signed)
States abdominal pain since this am, vomit multiple times.

## 2016-04-03 NOTE — ED Provider Notes (Signed)
[facility] Emergency Department Provider Note  ____________________________________________  Time seen: Approximately 9:24 PM  I have reviewed the triage vital signs and the nursing notes.   HISTORY  Chief Complaint Abdominal Pain  Is notable the patient is very hard of hearing. Per the family he does not use sign language, and they communicate by yelling and mouth reading.  HPI Cory Miller is a 75 y.o. male history of diabetes, coronary disease, previous ulcer, MI, chronic renal insufficiency  Patient presents today for evaluation of abdominal pain. Reports that he's been having left-sided abdominal pain with nausea and vomiting a few times today. Also a couple loose stools. No known fever.  Denies any chest pain or shortness of breath. No blood in his vomit or stool.  Describes a moderate to severe left lower abdominal pain sharp at times crampy and others.   Past Medical History  Diagnosis Date  . Diabetes mellitus without complication ([place])   . Hypertension   . Gout   . Neuropathy ([place])   . CAD (coronary artery disease) of artery bypass graft   . Carotid atherosclerosis   . CVA (cerebrovascular accident) ([place])   . GERD (gastroesophageal reflux disease)   . Esophageal ulcer   . Hyperlipidemia   . MI (myocardial infarction) ([place])   . Prostate CA [facility])     Patient Active Problem List   Diagnosis Date Noted  . Abdominal pain 08/02/2015  . Abdominal pain, acute, epigastric   . Pain in the abdomen     Past Surgical History  Procedure Laterality Date  . Arterial bypass surgry    . Esophagogastroduodenoscopy N/A 08/05/2015    Procedure: ESOPHAGOGASTRODUODENOSCOPY (EGD);  Surgeon: Hulen Luster, MD;  Location: [facility] ENDOSCOPY;  Service: Endoscopy;  Laterality: N/A;  . Esophagogastroduodenoscopy (egd) with propofol N/A 09/30/2015    Procedure: ESOPHAGOGASTRODUODENOSCOPY (EGD) WITH PROPOFOL;  Surgeon: Hulen Luster, MD;  Location: [facility] ENDOSCOPY;   Service: Gastroenterology;  Laterality: N/A;    Current Outpatient Rx  Name  Route  Sig  Dispense  Refill  . amLODipine (NORVASC) 10 MG tablet   Oral   Take 10 mg by mouth daily.         [name] atorvastatin (LIPITOR) 40 MG tablet   Oral   Take 40 mg by mouth at bedtime.         . cloNIDine (CATAPRES) 0.2 MG tablet   Oral   Take 0.2 mg by mouth 2 (two) times daily.         . clopidogrel (PLAVIX) 75 MG tablet   Oral   Take 75 mg by mouth daily.         . furosemide (LASIX) 20 MG tablet   Oral   Take 20 mg by mouth 2 (two) times daily.         . hydrALAZINE (APRESOLINE) 10 MG tablet   Oral   Take 10 mg by mouth 4 (four) times daily.         . hydrOXYzine (ATARAX/VISTARIL) 25 MG tablet   Oral   Take 25 mg by mouth every 6 (six) hours as needed for itching.         . isosorbide mononitrate (IMDUR) 120 MG 24 hr tablet   Oral   Take 120 mg by mouth daily.         . metoprolol succinate (TOPROL-XL) 100 MG 24 hr tablet   Oral   Take 100 mg by mouth daily.         [name]  oxyCODONE-acetaminophen (PERCOCET/ROXICET) 5-325 MG per tablet   Oral   Take 1 tablet by mouth 3 (three) times daily.         . pantoprazole (PROTONIX) 40 MG tablet   Oral   Take 1 tablet (40 mg total) by mouth daily. 40 mg BID for 1 month followed by once daily   60 tablet   0   . senna-docusate (SENOKOT-S) 8.6-50 MG per tablet   Oral   Take 1 tablet by mouth at bedtime as needed for mild constipation.   30 tablet   0   . triamcinolone cream (KENALOG) 0.1 %   Topical   Apply 1 application topically 2 (two) times daily.           Allergies Review of patient's allergies indicates no known allergies.  No family history on file.  Social History Social History  Substance Use Topics  . Smoking status: Never Smoker   . Smokeless tobacco: None  . Alcohol Use: No    Review of Systems Constitutional: No fever/chills Eyes: No visual changes. ENT: No sore throat. Cardiovascular:  Denies chest pain. Respiratory: Denies shortness of breath. Gastrointestinal:   No constipation. Genitourinary: Negative for dysuria. Musculoskeletal: Negative for back pain. Skin: Negative for rash. Neurological: Negative for headaches, focal weakness or numbness.  10-point ROS otherwise negative.  ____________________________________________   PHYSICAL EXAM:  VITAL SIGNS: ED Triage Vitals  Enc Vitals Group     BP 04/03/16 1833 193/76 mmHg     Pulse Rate 04/03/16 1831 103     Resp 04/03/16 2035 25     Temp 04/03/16 1831 97.9 F (36.6 C)     Temp Source 04/03/16 1831 Oral     SpO2 04/03/16 2035 97 %     Weight 04/03/16 1831 290 lb (131.543 kg)     Height 04/03/16 1831 6' (1.829 m)     Head Cir --      Peak Flow --      Pain Score 04/03/16 1831 10     Pain Loc --      Pain Edu? --      Excl. in [place]? --    Constitutional: Alert and oriented. Well appearing and Only has left side reporting pain, appears uncomfortable. Eyes: Conjunctivae are normal. PERRL. EOMI. Head: Atraumatic. Nose: No congestion/rhinnorhea. Mouth/Throat: Mucous membranes are moist.  Oropharynx non-erythematous. Neck: No stridor.   Cardiovascular: Normal rate, regular rhythm. Grossly normal heart sounds.  Good peripheral circulation. Respiratory: Normal respiratory effort.  No retractions. Lungs CTAB. Gastrointestinal: Soft and nontender except for moderate to severe tenderness with some mild rebound discomfort in the left lower abdomen. No pain in the right lower quadrant epigastrium or right upper quadrant. He does demonstrate been reports pain in the left lower quadrant to deep palpation in the right.. Protuberant, but not tympanitic. No abdominal bruits. No CVA tenderness. Musculoskeletal: No lower extremity tenderness nor edema.  No joint effusions. Neurologic:  Normal speech and language. No gross focal neurologic deficits are appreciated. Skin:  Skin is warm, dry and intact. No rash  noted. Psychiatric: Mood and affect are normal. Speech and behavior are normal.  ____________________________________________   LABS (all labs ordered are listed, but only abnormal results are displayed)  Labs Reviewed  CBC - Abnormal; Notable for the following:    WBC 13.5 (*)    All other components within normal limits  URINALYSIS COMPLETEWITH MICROSCOPIC ([facility] ONLY) - Abnormal; Notable for the following:    Color, Urine STRAW (*)  APPearance CLEAR (*)    Glucose, UA >500 (*)    Hgb urine dipstick 1+ (*)    Protein, ur >500 (*)    All other components within normal limits  COMPREHENSIVE METABOLIC PANEL - Abnormal; Notable for the following:    Potassium 5.5 (*)    CO2 19 (*)    Glucose, Bld 369 (*)    BUN 42 (*)    Creatinine, Ser 2.96 (*)    Total Protein 8.4 (*)    Alkaline Phosphatase 185 (*)    GFR calc non Af Amer 19 (*)    GFR calc Af Amer 22 (*)    All other components within normal limits  TROPONIN I - Abnormal; Notable for the following:    Troponin I 0.06 (*)    All other components within normal limits  LIPASE, BLOOD   ____________________________________________  EKG  Reviewed and interpreted by me at 2115 Ventricular rate 110 QRS 95 QRS 4:30 Sinus tachycardia, probable old anteroseptal infarct as well as probable old inferior infarct. No evidence of new ischemic abnormality.  Appears similar in appearance and morphology the patient's previous EKG from 08/02/2015 ____________________________________________  RADIOLOGY  CT pending ____________________________________________   PROCEDURES  Procedure(s) performed: None  Critical Care performed: No  ____________________________________________   INITIAL IMPRESSION / ASSESSMENT AND PLAN / ED COURSE  Pertinent labs & imaging results that were available during my care of the patient were reviewed by me and considered in my medical decision making (see chart for details).  Patient presents  for nausea vomiting and left-sided abdominal pain with mild air tinnitus noted to the left lower quadrant. Differential diagnosis includes but is not limited to, abdominal perforation, aortic dissection, cholecystitis, appendicitis, diverticulitis, colitis, esophagitis/gastritis, kidney stone, pyelonephritis, urinary tract infection, aortic aneurysm. All are considered in decision and treatment plan. Based upon the patient's presentation and risk factors, we will proceed with CT, broad workup and also obtain EKG and troponin as the patient carries a previous history of MI. Does not complain of any acute cardiac pulmonary or neurologic symptoms at this time, but given left-sided abdominal pain we do wish to exclude the possibility of acute coronary syndrome as an etiology.   No cardiac or pulmonary symptoms, EKG appears of its previous baseline. Based on the patient's previous history however a single troponin will be sent.  Ongoing care and disposition including follow-up and chemistry, troponin, and CT scan discussed with Dr. Joni Fears. Disposition based on ED workup and reevaluation for abdominal pain for which I am mildly suspicious of possible intra-acute process involving the left side of the abdomen given some associated left lower quadrant peritonitis. ____________________________________________   FINAL CLINICAL IMPRESSION(S) / ED DIAGNOSES  Final diagnoses:  Left sided abdominal pain of unknown cause      Delman Kitten, MD 04/03/16 2141

## 2016-04-04 ENCOUNTER — Encounter: Payer: Self-pay | Admitting: Internal Medicine

## 2016-04-04 DIAGNOSIS — R140 Abdominal distension (gaseous): Secondary | ICD-10-CM | POA: Diagnosis present

## 2016-04-04 DIAGNOSIS — Z79899 Other long term (current) drug therapy: Secondary | ICD-10-CM | POA: Diagnosis not present

## 2016-04-04 DIAGNOSIS — E1122 Type 2 diabetes mellitus with diabetic chronic kidney disease: Secondary | ICD-10-CM

## 2016-04-04 DIAGNOSIS — I252 Old myocardial infarction: Secondary | ICD-10-CM | POA: Diagnosis not present

## 2016-04-04 DIAGNOSIS — E1140 Type 2 diabetes mellitus with diabetic neuropathy, unspecified: Secondary | ICD-10-CM | POA: Diagnosis present

## 2016-04-04 DIAGNOSIS — N184 Chronic kidney disease, stage 4 (severe): Secondary | ICD-10-CM | POA: Diagnosis present

## 2016-04-04 DIAGNOSIS — A419 Sepsis, unspecified organism: Secondary | ICD-10-CM | POA: Diagnosis present

## 2016-04-04 DIAGNOSIS — Z79891 Long term (current) use of opiate analgesic: Secondary | ICD-10-CM | POA: Diagnosis not present

## 2016-04-04 DIAGNOSIS — K5732 Diverticulitis of large intestine without perforation or abscess without bleeding: Secondary | ICD-10-CM | POA: Diagnosis present

## 2016-04-04 DIAGNOSIS — Z951 Presence of aortocoronary bypass graft: Secondary | ICD-10-CM | POA: Diagnosis not present

## 2016-04-04 DIAGNOSIS — K219 Gastro-esophageal reflux disease without esophagitis: Secondary | ICD-10-CM | POA: Diagnosis present

## 2016-04-04 DIAGNOSIS — E785 Hyperlipidemia, unspecified: Secondary | ICD-10-CM | POA: Diagnosis present

## 2016-04-04 DIAGNOSIS — M109 Gout, unspecified: Secondary | ICD-10-CM | POA: Diagnosis present

## 2016-04-04 DIAGNOSIS — Z8673 Personal history of transient ischemic attack (TIA), and cerebral infarction without residual deficits: Secondary | ICD-10-CM | POA: Diagnosis not present

## 2016-04-04 DIAGNOSIS — I25810 Atherosclerosis of coronary artery bypass graft(s) without angina pectoris: Secondary | ICD-10-CM | POA: Diagnosis present

## 2016-04-04 DIAGNOSIS — Z823 Family history of stroke: Secondary | ICD-10-CM | POA: Diagnosis not present

## 2016-04-04 DIAGNOSIS — Z8249 Family history of ischemic heart disease and other diseases of the circulatory system: Secondary | ICD-10-CM | POA: Diagnosis not present

## 2016-04-04 DIAGNOSIS — Z7902 Long term (current) use of antithrombotics/antiplatelets: Secondary | ICD-10-CM | POA: Diagnosis not present

## 2016-04-04 DIAGNOSIS — Z8546 Personal history of malignant neoplasm of prostate: Secondary | ICD-10-CM | POA: Diagnosis not present

## 2016-04-04 DIAGNOSIS — R778 Other specified abnormalities of plasma proteins: Secondary | ICD-10-CM | POA: Diagnosis not present

## 2016-04-04 DIAGNOSIS — H918X9 Other specified hearing loss, unspecified ear: Secondary | ICD-10-CM | POA: Diagnosis present

## 2016-04-04 DIAGNOSIS — I10 Essential (primary) hypertension: Secondary | ICD-10-CM | POA: Diagnosis present

## 2016-04-04 DIAGNOSIS — E119 Type 2 diabetes mellitus without complications: Secondary | ICD-10-CM

## 2016-04-04 DIAGNOSIS — I2510 Atherosclerotic heart disease of native coronary artery without angina pectoris: Secondary | ICD-10-CM | POA: Diagnosis present

## 2016-04-04 DIAGNOSIS — Z833 Family history of diabetes mellitus: Secondary | ICD-10-CM | POA: Diagnosis not present

## 2016-04-04 DIAGNOSIS — N186 End stage renal disease: Secondary | ICD-10-CM

## 2016-04-04 DIAGNOSIS — I129 Hypertensive chronic kidney disease with stage 1 through stage 4 chronic kidney disease, or unspecified chronic kidney disease: Secondary | ICD-10-CM | POA: Diagnosis present

## 2016-04-04 LAB — TROPONIN I
Troponin I: 0.31 ng/mL — ABNORMAL HIGH (ref <0.031)
Troponin I: 0.77 ng/mL — ABNORMAL HIGH (ref <0.031)

## 2016-04-04 LAB — BASIC METABOLIC PANEL
ANION GAP: 9 (ref 5–15)
BUN: 40 mg/dL — ABNORMAL HIGH (ref 6–20)
CHLORIDE: 110 mmol/L (ref 101–111)
CO2: 22 mmol/L (ref 22–32)
CREATININE: 2.94 mg/dL — AB (ref 0.61–1.24)
Calcium: 8.9 mg/dL (ref 8.9–10.3)
GFR calc non Af Amer: 20 mL/min — ABNORMAL LOW (ref >60)
GFR, EST AFRICAN AMERICAN: 23 mL/min — AB (ref >60)
Glucose, Bld: 269 mg/dL — ABNORMAL HIGH (ref 65–99)
POTASSIUM: 5.3 mmol/L — AB (ref 3.5–5.1)
SODIUM: 141 mmol/L (ref 135–145)

## 2016-04-04 LAB — CBC
HCT: 36 % — ABNORMAL LOW (ref 40.0–52.0)
HEMATOCRIT: 39.5 % — AB (ref 40.0–52.0)
HEMOGLOBIN: 12 g/dL — AB (ref 13.0–18.0)
Hemoglobin: 13.2 g/dL (ref 13.0–18.0)
MCH: 29.6 pg (ref 26.0–34.0)
MCH: 30 pg (ref 26.0–34.0)
MCHC: 33.3 g/dL (ref 32.0–36.0)
MCHC: 33.3 g/dL (ref 32.0–36.0)
MCV: 88.9 fL (ref 80.0–100.0)
MCV: 90.3 fL (ref 80.0–100.0)
PLATELETS: 273 10*3/uL (ref 150–440)
PLATELETS: 230 10*3/uL (ref 150–440)
RBC: 4.45 MIL/uL (ref 4.40–5.90)
RBC: 3.99 MIL/uL — AB (ref 4.40–5.90)
RDW: 14.2 % (ref 11.5–14.5)
RDW: 13.9 % (ref 11.5–14.5)
WBC: 15.2 10*3/uL — ABNORMAL HIGH (ref 3.8–10.6)
WBC: 13.7 10*3/uL — AB (ref 3.8–10.6)

## 2016-04-04 LAB — HEMOGLOBIN A1C: Hgb A1c MFr Bld: 8.1 % — ABNORMAL HIGH (ref 4.0–6.0)

## 2016-04-04 LAB — GLUCOSE, CAPILLARY
GLUCOSE-CAPILLARY: 222 mg/dL — AB (ref 65–99)
GLUCOSE-CAPILLARY: 202 mg/dL — AB (ref 65–99)
Glucose-Capillary: 285 mg/dL — ABNORMAL HIGH (ref 65–99)

## 2016-04-04 LAB — CREATININE, SERUM
Creatinine, Ser: 3.01 mg/dL — ABNORMAL HIGH (ref 0.61–1.24)
GFR calc non Af Amer: 19 mL/min — ABNORMAL LOW (ref >60)
GFR, EST AFRICAN AMERICAN: 22 mL/min — AB (ref >60)

## 2016-04-04 MED ORDER — ACETAMINOPHEN 325 MG PO TABS: 650.0000 mg | ORAL_TABLET | Freq: Four times a day (QID) | ORAL | Status: DC | PRN

## 2016-04-04 MED ORDER — METRONIDAZOLE 500 MG PO TABS
500.0000 mg | ORAL_TABLET | Freq: Three times a day (TID) | ORAL | Status: DC
  Administered 2016-04-04: 500 mg via ORAL
  Filled 2016-04-04: qty 1

## 2016-04-04 MED ORDER — ATORVASTATIN CALCIUM 20 MG PO TABS
40.0000 mg | ORAL_TABLET | Freq: Every day | ORAL | Status: DC
  Administered 2016-04-04: 40 mg via ORAL
  Filled 2016-04-04: qty 2

## 2016-04-04 MED ORDER — ISOSORBIDE MONONITRATE ER 30 MG PO TB24
120.0000 mg | ORAL_TABLET | Freq: Every day | ORAL | Status: DC
  Administered 2016-04-04: 120 mg via ORAL
  Filled 2016-04-04: qty 4

## 2016-04-04 MED ORDER — HEPARIN SODIUM (PORCINE) 5000 UNIT/ML IJ SOLN
5000.0000 [IU] | Freq: Three times a day (TID) | INTRAMUSCULAR | Status: DC
  Administered 2016-04-04: 5000 [IU] via SUBCUTANEOUS
  Filled 2016-04-04: qty 1

## 2016-04-04 MED ORDER — MORPHINE SULFATE (PF) 2 MG/ML IV SOLN
2.0000 mg | Freq: Once | INTRAVENOUS | Status: AC
  Administered 2016-04-04: 2 mg via INTRAVENOUS
  Filled 2016-04-04: qty 1

## 2016-04-04 MED ORDER — CIPROFLOXACIN HCL 250 MG PO TABS
250.0000 mg | ORAL_TABLET | Freq: Two times a day (BID) | ORAL | Status: DC
  Administered 2016-04-04: 250 mg via ORAL
  Filled 2016-04-04 (×3): qty 1

## 2016-04-04 MED ORDER — METRONIDAZOLE 500 MG PO TABS: 500.0000 mg | ORAL_TABLET | Freq: Three times a day (TID) | ORAL | Status: DC

## 2016-04-04 MED ORDER — ONDANSETRON HCL 4 MG/2ML IJ SOLN: 4.0000 mg | Freq: Four times a day (QID) | INTRAMUSCULAR | Status: DC | PRN

## 2016-04-04 MED ORDER — INSULIN ASPART 100 UNIT/ML ~~LOC~~ SOLN
0.0000 [IU] | Freq: Four times a day (QID) | SUBCUTANEOUS | Status: DC
  Administered 2016-04-04: 3 [IU] via SUBCUTANEOUS
  Administered 2016-04-04: 5 [IU] via SUBCUTANEOUS
  Filled 2016-04-04: qty 3
  Filled 2016-04-04: qty 5

## 2016-04-04 MED ORDER — MORPHINE SULFATE (PF) 2 MG/ML IV SOLN
2.0000 mg | INTRAVENOUS | Status: DC | PRN
  Administered 2016-04-04: 2 mg via INTRAVENOUS
  Filled 2016-04-04: qty 1

## 2016-04-04 MED ORDER — AMLODIPINE BESYLATE 10 MG PO TABS
10.0000 mg | ORAL_TABLET | Freq: Every day | ORAL | Status: DC
  Administered 2016-04-04: 10 mg via ORAL
  Filled 2016-04-04: qty 1

## 2016-04-04 MED ORDER — INSULIN ASPART 100 UNIT/ML ~~LOC~~ SOLN
0.0000 [IU] | Freq: Four times a day (QID) | SUBCUTANEOUS | Status: DC
  Administered 2016-04-04: 3 [IU] via SUBCUTANEOUS
  Filled 2016-04-04: qty 3

## 2016-04-04 MED ORDER — HYDRALAZINE HCL 10 MG PO TABS
10.0000 mg | ORAL_TABLET | Freq: Four times a day (QID) | ORAL | Status: DC
  Administered 2016-04-04: 10 mg via ORAL
  Filled 2016-04-04 (×4): qty 1

## 2016-04-04 MED ORDER — ACETAMINOPHEN 650 MG RE SUPP: 650.0000 mg | Freq: Four times a day (QID) | RECTAL | Status: DC | PRN

## 2016-04-04 MED ORDER — PANTOPRAZOLE SODIUM 40 MG PO TBEC
40.0000 mg | DELAYED_RELEASE_TABLET | Freq: Every day | ORAL | Status: DC
  Administered 2016-04-04: 40 mg via ORAL
  Filled 2016-04-04 (×2): qty 1

## 2016-04-04 MED ORDER — METRONIDAZOLE IN NACL 5-0.79 MG/ML-% IV SOLN
500.0000 mg | Freq: Three times a day (TID) | INTRAVENOUS | Status: DC
  Administered 2016-04-04 (×2): 500 mg via INTRAVENOUS
  Filled 2016-04-04 (×4): qty 100

## 2016-04-04 MED ORDER — CLONIDINE HCL 0.1 MG PO TABS
0.2000 mg | ORAL_TABLET | Freq: Two times a day (BID) | ORAL | Status: DC
  Administered 2016-04-04 (×2): 0.2 mg via ORAL
  Filled 2016-04-04 (×2): qty 2

## 2016-04-04 MED ORDER — METOPROLOL SUCCINATE ER 50 MG PO TB24
100.0000 mg | ORAL_TABLET | Freq: Every day | ORAL | Status: DC
  Administered 2016-04-04: 100 mg via ORAL
  Filled 2016-04-04: qty 2

## 2016-04-04 MED ORDER — HYDRALAZINE HCL 20 MG/ML IJ SOLN: 10.0000 mg | Freq: Four times a day (QID) | INTRAMUSCULAR | Status: DC | PRN

## 2016-04-04 MED ORDER — CIPROFLOXACIN HCL 250 MG PO TABS: 250.0000 mg | ORAL_TABLET | Freq: Two times a day (BID) | ORAL | Status: DC

## 2016-04-04 MED ORDER — CIPROFLOXACIN IN D5W 400 MG/200ML IV SOLN
400.0000 mg | Freq: Every day | INTRAVENOUS | Status: DC
Start: 2016-04-04 — End: 2016-04-04
  Administered 2016-04-04: 400 mg via INTRAVENOUS
  Filled 2016-04-04 (×2): qty 200

## 2016-04-04 MED ORDER — ONDANSETRON HCL 4 MG PO TABS: 4.0000 mg | ORAL_TABLET | Freq: Four times a day (QID) | ORAL | Status: DC | PRN

## 2016-04-04 MED ORDER — CLOPIDOGREL BISULFATE 75 MG PO TABS
75.0000 mg | ORAL_TABLET | Freq: Every day | ORAL | Status: DC
  Administered 2016-04-04: 75 mg via ORAL
  Filled 2016-04-04: qty 1

## 2016-04-04 MED ORDER — FUROSEMIDE 40 MG PO TABS
20.0000 mg | ORAL_TABLET | Freq: Two times a day (BID) | ORAL | Status: DC
  Administered 2016-04-04: 20 mg via ORAL
  Filled 2016-04-04 (×2): qty 1

## 2016-04-04 MED ORDER — SODIUM CHLORIDE 0.9 % IV SOLN
INTRAVENOUS | Status: DC
  Administered 2016-04-04: 02:00:00 via INTRAVENOUS

## 2016-04-04 NOTE — Progress Notes (Signed)
Called regarding increase in troponin. The patient is chest pain-free. Acute on chronic kidney disease noted. Differential diagnosis includes poor renal clearance of troponin. Continue to trend biomarkers

## 2016-04-04 NOTE — Progress Notes (Signed)
Inpatient Diabetes Program Recommendations  AACE/ADA: New Consensus Statement on Inpatient Glycemic Control (2015)  Target Ranges:  Prepandial:   less than 140 mg/dL      Peak postprandial:   less than 180 mg/dL (1-2 hours)      Critically ill patients:  140 - 180 mg/dL  Results for Cory Miller, Cory Miller (MRN [id number]) as of 04/04/2016 11:33 04/04/2016 01:29 04/04/2016 08:12  285 (H) 222 (H)  Results for Cory Miller, Cory Miller (MRN [id number]) as of 04/04/2016 11:33  Ref. Range 04/04/2016 01:24  Hemoglobin A1C Latest Ref Range: 4.0-6.0 % 8.1 (H)   Review of Glycemic Control Admitted with abdominal pain Diabetes history: DM Type 2 Outpatient Diabetes medications: No DM home meds Current orders for Inpatient glycemic control: Novolog correction sensitive 0-9 units q 6 hrs.  Inpatient Diabetes Program Recommendations:  Noted patient ordered diet. Please consider adjusting Novolog correction to tid and hs. Also consider starting basal insulin Lantus 20 units (0.15 units/kg) q hs.  Thank you, Nani Gasser. Ayannah Faddis, RN, MSN, CDE Inpatient Glycemic Control Team Team Pager [phone] (8am-5pm) 04/04/2016 11:47 AM

## 2016-04-04 NOTE — Progress Notes (Signed)
Notified dr.diamond of pt request for cpap as he wears at home at night and pt c/o pain, pain med not working. New orders placed.

## 2016-04-04 NOTE — Discharge Summary (Signed)
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 Diabetes mellitus without complication ([place])   . Hypertension   . Gout   . Neuropathy ([place])   . CAD (coronary artery disease) of artery bypass graft   . Carotid atherosclerosis   . CVA (cerebrovascular accident) ([place])   . GERD (gastroesophageal reflux disease)   . Esophageal ulcer   . Hyperlipidemia   . MI (myocardial infarction) ([place])   . Prostate CA [facility])     HOSPITAL COURSE:  Cory Miller is a 75 y.o. male who presents with acute lower abdominal pain, worst in the left lower quadrant, associated with nausea and diarrhea. Patient states the pain started about 5:00 AM on the day of presentation the ED. Pain was persistent since that time, not improved or exacerbated by anything, but grew progressively worse. In the ED he was found to be tachycardic on arrival, mild leukocytosis. CT scan of his abdomen and pelvis did not elucidate any causative pathology  1.SIRS - hemodynamically stable. Source is almost certainly abdominal infection  likley subacute diverticulitis - IV antibiotics change to po cipro and flagyl  2. Abdominal pain - CT scan did not elucidate any pathology, though there is a strong clinical suspicion for inflammatory/infectious abdominal process despite this. IV analgesia and IV antiemetics ordered, as well as IV antibiotics and cultures as  above. -Patient symptoms have resolved. He is ready to eat regular food.  3. Diabetes (Cory Miller) - sliding scale insulin with corresponding glucose checks  4. HTN (hypertension) - stable, continue home meds  5.CAD (coronary artery disease) - continue home meds  6 CKD (chronic kidney disease), stage IV (HCC) - at baseline, avoid nephrotoxins, monitor closely.  Pt follows with dr Juleen China  7.GERD (gastroesophageal reflux disease) - home dose PPI  Discussed at length with patient's sister Miss Cory Miller. Discharge plan was discussed with her. CONSULTS OBTAINED:     DRUG ALLERGIES:  No Known Allergies  DISCHARGE MEDICATIONS:   Current Discharge Medication List    START taking these medications   Details  ciprofloxacin (CIPRO) 250 MG tablet Take 1 tablet (250 mg total) by mouth 2 (two) times daily. Qty: 10 tablet, Refills: 0    metroNIDAZOLE (FLAGYL) 500 MG tablet Take 1 tablet (500 mg total) by mouth every 8 (eight) hours. Qty: 15 tablet, Refills: 0      CONTINUE these medications which have NOT CHANGED   Details  amLODipine (NORVASC) 10 MG tablet Take 10 mg by mouth daily.    atorvastatin (LIPITOR) 40 MG tablet Take 40 mg by mouth at bedtime.    cloNIDine (CATAPRES) 0.2 MG tablet Take 0.2 mg by mouth 2 (two) times daily.    clopidogrel (PLAVIX) 75 MG tablet Take 75 mg by mouth daily.    furosemide (LASIX) 20 MG tablet Take 20 mg by mouth 2 (two) times daily.    hydrALAZINE (APRESOLINE) 10 MG tablet Take 10 mg by mouth 4 (four) times daily.  hydrOXYzine (ATARAX/VISTARIL) 25 MG tablet Take 25 mg by mouth every 6 (six) hours as needed for itching.    isosorbide mononitrate (IMDUR) 120 MG 24 hr tablet Take 120 mg by mouth daily.    metoprolol succinate (TOPROL-XL) 100 MG 24 hr tablet Take 100 mg by mouth daily.    oxyCODONE-acetaminophen (PERCOCET/ROXICET) 5-325 MG per tablet Take 1 tablet by mouth 3 (three) times daily.    pantoprazole (PROTONIX) 40 MG tablet Take 1  tablet (40 mg total) by mouth daily. 40 mg BID for 1 month followed by once daily Qty: 60 tablet, Refills: 0    senna-docusate (SENOKOT-S) 8.6-50 MG per tablet Take 1 tablet by mouth at bedtime as needed for mild constipation. Qty: 30 tablet, Refills: 0    triamcinolone cream (KENALOG) 0.1 % Apply 1 application topically 2 (two) times daily.        If you experience worsening of your admission symptoms, develop shortness of breath, life threatening emergency, suicidal or homicidal thoughts you must seek medical attention immediately by calling 911 or calling your MD immediately  if symptoms less severe.  You Must read complete instructions/literature along with all the possible adverse reactions/side effects for all the Medicines you take and that have been prescribed to you. Take any new Medicines after you have completely understood and accept all the possible adverse reactions/side effects.   Please note  You were cared for by a hospitalist during your hospital stay. If you have any questions about your discharge medications or the care you received while you were in the hospital after you are discharged, you can call the unit and asked to speak with the hospitalist on call if the hospitalist that took care of you is not available. Once you are discharged, your primary care physician will handle any further medical issues. Please note that NO REFILLS for any discharge medications will be authorized once you are discharged, as it is imperative that you return to your primary care physician (or establish a relationship with a primary care physician if you do not have one) for your aftercare needs so that they can reassess your need for medications and monitor your lab values. Today   SUBJECTIVE   Patient very hard on hearing. He did come in with abdominal cramping. No diarrhea or vomiting. Abdominal pain resolved. Patient wants to eat regular food  VITAL SIGNS:  Blood pressure 137/47, pulse  77, temperature 98.2 F (36.8 C), temperature source Oral, resp. rate 22, height 6' (1.829 m), weight 131.543 kg (290 lb), SpO2 100 %.  I/O:   Intake/Output Summary (Last 24 hours) at 04/04/16 1317 Last data filed at 04/04/16 1046  Gross per 24 hour  Intake    747 ml  Output      0 ml  Net    747 ml    PHYSICAL EXAMINATION:  GENERAL:  75 y.o.-year-old patient lying in the bed with no acute distress. Morbidly obese EYES: Pupils equal, round, reactive to light and accommodation. No scleral icterus. Extraocular muscles intact.  HEENT: Head atraumatic, normocephalic. Oropharynx and nasopharynx clear.  NECK:  Supple, no jugular venous distention. No thyroid enlargement, no tenderness.  LUNGS: Normal breath sounds bilaterally, no wheezing, rales,rhonchi or crepitation. No use of accessory muscles of respiration.  CARDIOVASCULAR: S1, S2 normal. No murmurs, rubs, or gallops.  ABDOMEN: Soft, non-tender, non-distended. Bowel sounds present. No organomegaly or mass.  EXTREMITIES: No pedal edema, cyanosis, or clubbing.  NEUROLOGIC: Cranial nerves II through XII are intact. Muscle  strength 5/5 in all extremities. Sensation intact. Gait not checked.  PSYCHIATRIC: The patient is alert and oriented x 3.  SKIN: No obvious rash, lesion, or ulcer.   DATA REVIEW:   CBC   Recent Labs Lab 04/04/16 0740  WBC 13.7*  HGB 12.0*  HCT 36.0*  PLT 230    Chemistries   Recent Labs Lab 04/03/16 2040  04/04/16 0740  NA 139  --  141  K 5.5*  --  5.3*  CL 107  --  110  CO2 19*  --  22  GLUCOSE 369*  --  269*  BUN 42*  --  40*  CREATININE 2.96*  < > 2.94*  CALCIUM 9.5  --  8.9  AST 28  --   --   ALT 20  --   --   ALKPHOS 185*  --   --   BILITOT 0.8  --   --   < > = values in this interval not displayed.  Microbiology Results   Recent Results (from the past 240 hour(s))  Culture, blood (Routine X 2) w Reflex to ID Panel     Status: None (Preliminary result)   Collection Time: 04/04/16  1:24  AM  Result Value Ref Range Status   Specimen Description BLOOD LEFT WRIST  Final   Special Requests BOTTLES DRAWN AEROBIC AND ANAEROBIC 5ML  Final   Culture NO GROWTH < 12 HOURS  Final   Report Status PENDING  Incomplete  Culture, blood (Routine X 2) w Reflex to ID Panel     Status: None (Preliminary result)   Collection Time: 04/04/16  1:33 AM  Result Value Ref Range Status   Specimen Description BLOOD RIGHT HAND  Final   Special Requests BOTTLES DRAWN AEROBIC AND ANAEROBIC 5ML  Final   Culture NO GROWTH < 12 HOURS  Final   Report Status PENDING  Incomplete    RADIOLOGY:  Ct Abdomen Pelvis Wo Contrast  04/03/2016  CLINICAL DATA:  Left-sided abdominal pain common nausea with vomiting, and diarrhea since this morning. Renal insufficiency. Personal history of prostate carcinoma. EXAM: CT ABDOMEN AND PELVIS WITHOUT CONTRAST TECHNIQUE: Multidetector CT imaging of the abdomen and pelvis was performed following the standard protocol without IV contrast. COMPARISON:  08/02/2015 FINDINGS: Lower chest: No acute findings. Stable bibasilar scarring and pleural calcification. Hepatobiliary: No mass visualized on this un-enhanced exam. Gallbladder is unremarkable. Pancreas: No mass or inflammatory process identified on this un-enhanced exam. Spleen: Within normal limits in size. Adrenals/Urinary Tract: No evidence of urolithiasis or hydronephrosis. Probable tiny right renal cyst remains stable. No definite mass visualized on this un-enhanced exam. Unopacified urinary bladder is unremarkable in appearance. Stomach/Bowel: No evidence of obstruction, inflammatory process, or abnormal fluid collections. Normal appendix visualized. Mild right colonic diverticulosis noted. No evidence of diverticulitis. Vascular/Lymphatic: No pathologically enlarged lymph nodes. No evidence of abdominal aortic aneurysm. Aortic atherosclerotic calcification noted. Reproductive: No mass or other significant abnormality. Other: None.  Musculoskeletal:  No suspicious bone lesions identified. IMPRESSION: No acute findings identified within the abdomen or pelvis. Electronically Signed   By: Earle Gell M.D.   On: 04/03/2016 22:45     Management plans discussed with the patient, family and they are in agreement.  CODE STATUS:     Code Status Orders        Start     Ordered   04/04/16 0120  Full code   Continuous     04/04/16 0119    Code Status History  Date Active Date Inactive Code Status Order ID Comments User Context   08/02/2015  2:04 PM 08/06/2015  5:27 PM Full Code [id number]  Bettey Costa, MD Inpatient      TOTAL TIME TAKING CARE OF THIS PATIENT: 40 minutes.    Winfred Iiams M.D on 04/04/2016 at 1:17 PM  Between 7am to 6pm - Pager - [phone] After 6pm go to [email or web address] - password [email or web address] [facility]  Office  [phone]  CC: Primary care physician; [place]

## 2016-04-04 NOTE — ED Provider Notes (Signed)
Assumed care of the patient from Dr. Jacqualine Code at 10:00 PM. On following up on results, labs are baseline, CT scan shows no acute findings. However, on reassessing the patient, he remains persistently tachycardic with a heart rate of 105, slightly diaphoretic, uncomfortable appearing, with a distended tympanitic abdomen that is diffusely tender. He also does have a leukocytosis of 13.5 thousand. Troponin of 0.06 is actually improved from his baseline. Although the CT scan is unremarkable, and concern that he may have an occult perforation or other occult pathology that warrants further monitoring in the hospital as he may have an underlying severe process that will declare itself within the next few hours. Case discussed with hospitalist for admission.  Carrie Mew, MD 04/04/16 [phone]

## 2016-04-04 NOTE — Progress Notes (Signed)
Notified dr.diamond of troponin level elevated at 0.31 from previous 0.06. Acknowledged.

## 2016-04-04 NOTE — H&P (Signed)
[place] [facility] [place] NAME: Cory Miller    MR#:  [id number]  DATE OF BIRTH:  January 08, 1941  DATE OF ADMISSION:  04/03/2016  PRIMARY CARE PHYSICIAN: [facility]   REQUESTING/REFERRING PHYSICIAN: Joni Fears, MD  CHIEF COMPLAINT:   Chief Complaint  Patient presents with  . Abdominal Pain    HISTORY OF PRESENT ILLNESS:  Cory Miller  is a 75 y.o. male who presents with acute lower abdominal pain, worst in the left lower quadrant, associated with nausea and diarrhea. Patient states the pain started about 5:00 AM on the day of presentation the ED. Pain was persistent since that time, not improved or exacerbated by anything, but grew progressively worse. In the ED he was found to be tachycardic on arrival, mild leukocytosis. CT scan of his abdomen and pelvis did not elucidate any causative pathology. However, the patient's symptoms and clinical presentation is very consistent with intra-abdominal inflammation or infection, colitis versus diverticulitis. He states he has had diverticulitis in the past, and then his symptoms were almost identical. Hospitalists were called for admission  PAST MEDICAL HISTORY:   Past Medical History  Diagnosis Date  . Diabetes mellitus without complication ([place])   . Hypertension   . Gout   . Neuropathy ([place])   . CAD (coronary artery disease) of artery bypass graft   . Carotid atherosclerosis   . CVA (cerebrovascular accident) ([place])   . GERD (gastroesophageal reflux disease)   . Esophageal ulcer   . Hyperlipidemia   . MI (myocardial infarction) ([place])   . Prostate CA [facility])     PAST SURGICAL HISTORY:   Past Surgical History  Procedure Laterality Date  . Arterial bypass surgry    . Esophagogastroduodenoscopy N/A 08/05/2015    Procedure: ESOPHAGOGASTRODUODENOSCOPY (EGD);  Surgeon: Hulen Luster, MD;  Location: [facility] ENDOSCOPY;  Service: Endoscopy;  Laterality: N/A;  .  Esophagogastroduodenoscopy (egd) with propofol N/A 09/30/2015    Procedure: ESOPHAGOGASTRODUODENOSCOPY (EGD) WITH PROPOFOL;  Surgeon: Hulen Luster, MD;  Location: [facility] ENDOSCOPY;  Service: Gastroenterology;  Laterality: N/A;    SOCIAL HISTORY:   Social History  Substance Use Topics  . Smoking status: Never Smoker   . Smokeless tobacco: Not on file  . Alcohol Use: No    FAMILY HISTORY:   Family History  Problem Relation Age of Onset  . CAD    . Diabetes    . Cancer    . Stroke      DRUG ALLERGIES:  No Known Allergies  MEDICATIONS AT HOME:   Prior to Admission medications   Medication Sig Start Date End Date Taking? Authorizing Provider  amLODipine (NORVASC) 10 MG tablet Take 10 mg by mouth daily.    Historical Provider, MD  atorvastatin (LIPITOR) 40 MG tablet Take 40 mg by mouth at bedtime.    Historical Provider, MD  cloNIDine (CATAPRES) 0.2 MG tablet Take 0.2 mg by mouth 2 (two) times daily.    Historical Provider, MD  clopidogrel (PLAVIX) 75 MG tablet Take 75 mg by mouth daily.    Historical Provider, MD  furosemide (LASIX) 20 MG tablet Take 20 mg by mouth 2 (two) times daily.    Historical Provider, MD  hydrALAZINE (APRESOLINE) 10 MG tablet Take 10 mg by mouth 4 (four) times daily.    Historical Provider, MD  hydrOXYzine (ATARAX/VISTARIL) 25 MG tablet Take 25 mg by mouth every 6 (six) hours as needed for itching.    Historical Provider, MD  isosorbide mononitrate (IMDUR) 120 MG 24 hr tablet Take 120 mg by mouth daily.    Historical Provider, MD  metoprolol succinate (TOPROL-XL) 100 MG 24 hr tablet Take 100 mg by mouth daily.    Historical Provider, MD  oxyCODONE-acetaminophen (PERCOCET/ROXICET) 5-325 MG per tablet Take 1 tablet by mouth 3 (three) times daily.    Historical Provider, MD  pantoprazole (PROTONIX) 40 MG tablet Take 1 tablet (40 mg total) by mouth daily. 40 mg BID for 1 month followed by once daily 08/06/15   Max Sane, MD  senna-docusate (SENOKOT-S) 8.6-50 MG per  tablet Take 1 tablet by mouth at bedtime as needed for mild constipation. 08/06/15   Max Sane, MD  triamcinolone cream (KENALOG) 0.1 % Apply 1 application topically 2 (two) times daily.    Historical Provider, MD    REVIEW OF SYSTEMS:  Review of Systems  Constitutional: Negative for fever, chills, weight loss and malaise/fatigue.  HENT: Negative for ear pain, hearing loss and tinnitus.   Eyes: Negative for blurred vision, double vision, pain and redness.  Respiratory: Negative for cough, hemoptysis and shortness of breath.   Cardiovascular: Negative for chest pain, palpitations, orthopnea and leg swelling.  Gastrointestinal: Positive for nausea, vomiting, abdominal pain and diarrhea. Negative for constipation.  Genitourinary: Negative for dysuria, frequency and hematuria.  Musculoskeletal: Negative for back pain, joint pain and neck pain.  Skin:       No acne, rash, or lesions  Neurological: Negative for dizziness, tremors, focal weakness and weakness.  Endo/Heme/Allergies: Negative for polydipsia. Does not bruise/bleed easily.  Psychiatric/Behavioral: Negative for depression. The patient is not nervous/anxious and does not have insomnia.      VITAL SIGNS:   Filed Vitals:   04/03/16 2044 04/03/16 2105 04/03/16 2140 04/03/16 2300  BP:    164/84  Pulse: 99  100 96  Temp:  97.9 F (36.6 C)  98.2 F (36.8 C)  TempSrc:  Oral    Resp: 19  18 18   Height:      Weight:      SpO2: 96%  95% 96%   Wt Readings from Last 3 Encounters:  04/03/16 131.543 kg (290 lb)  09/30/15 122.471 kg (270 lb)  08/05/15 136.079 kg (300 lb)    PHYSICAL EXAMINATION:  Physical Exam  Vitals reviewed. Constitutional: He is oriented to person, place, and time. He appears well-developed and well-nourished. No distress.  HENT:  Head: Normocephalic and atraumatic.  Mouth/Throat: Oropharynx is clear and moist.  Eyes: Conjunctivae and EOM are normal. Pupils are equal, round, and reactive to light. No scleral  icterus.  Neck: Normal range of motion. Neck supple. No JVD present. No thyromegaly present.  Cardiovascular: Normal rate, regular rhythm and intact distal pulses.  Exam reveals no gallop and no friction rub.   No murmur heard. Respiratory: Effort normal and breath sounds normal. No respiratory distress. He has no wheezes. He has no rales.  GI: Soft. Bowel sounds are normal. He exhibits no distension. There is tenderness (ow abdomen, greatest in the left lower quadrant).  Musculoskeletal: Normal range of motion. He exhibits no edema.  No arthritis, no gout  Lymphadenopathy:    He has no cervical adenopathy.  Neurological: He is alert and oriented to person, place, and time. No cranial nerve deficit.  No dysarthria, no aphasia  Skin: Skin is warm and dry. No rash noted. No erythema.  Psychiatric: He has a normal mood and affect. His behavior is normal. Judgment and thought content normal.  LABORATORY PANEL:   CBC  Recent Labs Lab 04/03/16 1849  WBC 13.5*  HGB 13.5  HCT 41.6  PLT 267   ------------------------------------------------------------------------------------------------------------------  Chemistries   Recent Labs Lab 04/03/16 2040  NA 139  K 5.5*  CL 107  CO2 19*  GLUCOSE 369*  BUN 42*  CREATININE 2.96*  CALCIUM 9.5  AST 28  ALT 20  ALKPHOS 185*  BILITOT 0.8   ------------------------------------------------------------------------------------------------------------------  Cardiac Enzymes  Recent Labs Lab 04/03/16 2040  TROPONINI 0.06*   ------------------------------------------------------------------------------------------------------------------  RADIOLOGY:  Ct Abdomen Pelvis Wo Contrast  04/03/2016  CLINICAL DATA:  Left-sided abdominal pain common nausea with vomiting, and diarrhea since this morning. Renal insufficiency. Personal history of prostate carcinoma. EXAM: CT ABDOMEN AND PELVIS WITHOUT CONTRAST TECHNIQUE: Multidetector CT  imaging of the abdomen and pelvis was performed following the standard protocol without IV contrast. COMPARISON:  08/02/2015 FINDINGS: Lower chest: No acute findings. Stable bibasilar scarring and pleural calcification. Hepatobiliary: No mass visualized on this un-enhanced exam. Gallbladder is unremarkable. Pancreas: No mass or inflammatory process identified on this un-enhanced exam. Spleen: Within normal limits in size. Adrenals/Urinary Tract: No evidence of urolithiasis or hydronephrosis. Probable tiny right renal cyst remains stable. No definite mass visualized on this un-enhanced exam. Unopacified urinary bladder is unremarkable in appearance. Stomach/Bowel: No evidence of obstruction, inflammatory process, or abnormal fluid collections. Normal appendix visualized. Mild right colonic diverticulosis noted. No evidence of diverticulitis. Vascular/Lymphatic: No pathologically enlarged lymph nodes. No evidence of abdominal aortic aneurysm. Aortic atherosclerotic calcification noted. Reproductive: No mass or other significant abnormality. Other: None. Musculoskeletal:  No suspicious bone lesions identified. IMPRESSION: No acute findings identified within the abdomen or pelvis. Electronically Signed   By: Earle Gell M.D.   On: 04/03/2016 22:45    EKG:   Orders placed or performed during the hospital encounter of 04/03/16  . ED EKG  . ED EKG  . EKG 12-Lead  . EKG 12-Lead    IMPRESSION AND PLAN:  Principal Problem:   Sepsis ([place]) - hemodynamically stable. Source is almost certainly abdominal infection, IV antibiotics ordered, blood cultures ordered. Active Problems:   Abdominal pain - CT scan did not elucidate any pathology, though there is a strong clinical suspicion for inflammatory/infectious abdominal process despite this. IV analgesia and IV antiemetics ordered, as well as IV antibiotics and cultures as above.   Diabetes ([place]) - sliding scale insulin with corresponding glucose checks   HTN  (hypertension) - stable, continue home meds   CAD (coronary artery disease) - continue home meds   CKD (chronic kidney disease), stage IV (HCC) - at baseline, avoid nephrotoxins, monitor closely   GERD (gastroesophageal reflux disease) - home dose PPI  All the records are reviewed and case discussed with ED provider. Management plans discussed with the patient and/or family.  DVT PROPHYLAXIS: SubQ heparin  GI PROPHYLAXIS: PPI  ADMISSION STATUS: Inpatient  CODE STATUS: Full Code Status History    Date Active Date Inactive Code Status Order ID Comments User Context   08/02/2015  2:04 PM 08/06/2015  5:27 PM Full Code [id number]  Bettey Costa, MD Inpatient      TOTAL TIME TAKING CARE OF THIS PATIENT: 45 minutes.    Miasia Crabtree [place] 04/04/2016, 12:16 AM  [facility]  Office  [phone]  CC: Primary care physician; [place]

## 2016-04-04 NOTE — Progress Notes (Signed)
Pharmacy Antibiotic Note  Cory Miller is a 75 y.o. male admitted on 04/03/2016 with IAI.  Pharmacy has been consulted for ciprofloxacin dosing.  Plan: Ciprofloxacin 400 mg IV Q24H  Height: 6' (182.9 cm) Weight: 290 lb (131.543 kg) IBW/kg (Calculated) : 77.6  Temp (24hrs), Avg:98 F (36.7 C), Min:97.9 F (36.6 C), Max:98.2 F (36.8 C)   Recent Labs Lab 04/03/16 1849 04/03/16 2040  WBC 13.5*  --   CREATININE  --  2.96*    Estimated Creatinine Clearance: 30.7 mL/min (by C-G formula based on Cr of 2.96).    No Known Allergies   Thank you for allowing pharmacy to be a part of this patient's care.  Laural Benes, Pharm.D., BCPS Clinical Pharmacist 04/04/2016 1:27 AM

## 2016-04-04 NOTE — Discharge Instructions (Signed)
Keep your GI appointment scheduled for next Thursday.

## 2016-04-09 LAB — CULTURE, BLOOD (ROUTINE X 2)
Culture: NO GROWTH
Culture: NO GROWTH

## 2016-06-01 ENCOUNTER — Encounter: Payer: Self-pay | Admitting: *Deleted

## 2016-06-02 ENCOUNTER — Encounter
Admission: RE | Disposition: A | Discharge status: Home / Self Care | Payer: Self-pay | Source: Ambulatory Visit | Attending: Gastroenterology

## 2016-06-02 ENCOUNTER — Ambulatory Visit: Payer: Medicare Other | Admitting: Anesthesiology

## 2016-06-02 ENCOUNTER — Encounter: Payer: Self-pay | Admitting: Anesthesiology

## 2016-06-02 ENCOUNTER — Ambulatory Visit
Admission: RE | Admit: 2016-06-02 | Discharge: 2016-06-02 | Disposition: A | Discharge status: Home / Self Care | Payer: Medicare Other | Source: Ambulatory Visit | Attending: Gastroenterology | Admitting: Gastroenterology

## 2016-06-02 DIAGNOSIS — I2510 Atherosclerotic heart disease of native coronary artery without angina pectoris: Secondary | ICD-10-CM | POA: Insufficient documentation

## 2016-06-02 DIAGNOSIS — K5730 Diverticulosis of large intestine without perforation or abscess without bleeding: Secondary | ICD-10-CM | POA: Diagnosis not present

## 2016-06-02 DIAGNOSIS — Z794 Long term (current) use of insulin: Secondary | ICD-10-CM | POA: Diagnosis not present

## 2016-06-02 DIAGNOSIS — E1122 Type 2 diabetes mellitus with diabetic chronic kidney disease: Secondary | ICD-10-CM | POA: Insufficient documentation

## 2016-06-02 DIAGNOSIS — R778 Other specified abnormalities of plasma proteins: Secondary | ICD-10-CM | POA: Diagnosis not present

## 2016-06-02 DIAGNOSIS — Z8249 Family history of ischemic heart disease and other diseases of the circulatory system: Secondary | ICD-10-CM | POA: Insufficient documentation

## 2016-06-02 DIAGNOSIS — M7989 Other specified soft tissue disorders: Secondary | ICD-10-CM | POA: Insufficient documentation

## 2016-06-02 DIAGNOSIS — H9190 Unspecified hearing loss, unspecified ear: Secondary | ICD-10-CM | POA: Insufficient documentation

## 2016-06-02 DIAGNOSIS — D124 Benign neoplasm of descending colon: Secondary | ICD-10-CM | POA: Insufficient documentation

## 2016-06-02 DIAGNOSIS — N183 Chronic kidney disease, stage 3 (moderate): Secondary | ICD-10-CM | POA: Insufficient documentation

## 2016-06-02 DIAGNOSIS — Z823 Family history of stroke: Secondary | ICD-10-CM | POA: Diagnosis not present

## 2016-06-02 DIAGNOSIS — Z8261 Family history of arthritis: Secondary | ICD-10-CM | POA: Diagnosis not present

## 2016-06-02 DIAGNOSIS — I25810 Atherosclerosis of coronary artery bypass graft(s) without angina pectoris: Secondary | ICD-10-CM | POA: Insufficient documentation

## 2016-06-02 DIAGNOSIS — Z7982 Long term (current) use of aspirin: Secondary | ICD-10-CM | POA: Diagnosis not present

## 2016-06-02 DIAGNOSIS — Z8673 Personal history of transient ischemic attack (TIA), and cerebral infarction without residual deficits: Secondary | ICD-10-CM | POA: Diagnosis not present

## 2016-06-02 DIAGNOSIS — R0602 Shortness of breath: Secondary | ICD-10-CM | POA: Insufficient documentation

## 2016-06-02 DIAGNOSIS — E785 Hyperlipidemia, unspecified: Secondary | ICD-10-CM | POA: Diagnosis not present

## 2016-06-02 DIAGNOSIS — Z951 Presence of aortocoronary bypass graft: Secondary | ICD-10-CM | POA: Diagnosis not present

## 2016-06-02 DIAGNOSIS — Z79899 Other long term (current) drug therapy: Secondary | ICD-10-CM | POA: Diagnosis not present

## 2016-06-02 DIAGNOSIS — Z833 Family history of diabetes mellitus: Secondary | ICD-10-CM | POA: Diagnosis not present

## 2016-06-02 DIAGNOSIS — Z86010 Personal history of colonic polyps: Secondary | ICD-10-CM | POA: Insufficient documentation

## 2016-06-02 DIAGNOSIS — Z8349 Family history of other endocrine, nutritional and metabolic diseases: Secondary | ICD-10-CM | POA: Insufficient documentation

## 2016-06-02 DIAGNOSIS — D125 Benign neoplasm of sigmoid colon: Secondary | ICD-10-CM | POA: Diagnosis present

## 2016-06-02 DIAGNOSIS — I739 Peripheral vascular disease, unspecified: Secondary | ICD-10-CM | POA: Insufficient documentation

## 2016-06-02 DIAGNOSIS — Z8546 Personal history of malignant neoplasm of prostate: Secondary | ICD-10-CM | POA: Insufficient documentation

## 2016-06-02 DIAGNOSIS — D123 Benign neoplasm of transverse colon: Secondary | ICD-10-CM | POA: Diagnosis not present

## 2016-06-02 DIAGNOSIS — Z8711 Personal history of peptic ulcer disease: Secondary | ICD-10-CM | POA: Diagnosis not present

## 2016-06-02 DIAGNOSIS — I252 Old myocardial infarction: Secondary | ICD-10-CM | POA: Diagnosis not present

## 2016-06-02 DIAGNOSIS — K219 Gastro-esophageal reflux disease without esophagitis: Secondary | ICD-10-CM | POA: Insufficient documentation

## 2016-06-02 DIAGNOSIS — M109 Gout, unspecified: Secondary | ICD-10-CM | POA: Insufficient documentation

## 2016-06-02 DIAGNOSIS — Z87891 Personal history of nicotine dependence: Secondary | ICD-10-CM | POA: Diagnosis not present

## 2016-06-02 DIAGNOSIS — I129 Hypertensive chronic kidney disease with stage 1 through stage 4 chronic kidney disease, or unspecified chronic kidney disease: Secondary | ICD-10-CM | POA: Diagnosis not present

## 2016-06-02 HISTORY — DX: Chronic kidney disease, unspecified: N18.9

## 2016-06-02 HISTORY — DX: Male erectile dysfunction, unspecified: N52.9

## 2016-06-02 HISTORY — DX: Disease of blood and blood-forming organs, unspecified: D75.9

## 2016-06-02 HISTORY — PX: COLONOSCOPY WITH PROPOFOL: SHX5780

## 2016-06-02 HISTORY — DX: Occlusion and stenosis of unspecified carotid artery: I65.29

## 2016-06-02 HISTORY — DX: Unspecified hearing loss, unspecified ear: H91.90

## 2016-06-02 LAB — GLUCOSE, CAPILLARY: GLUCOSE-CAPILLARY: 182 mg/dL — AB (ref 65–99)

## 2016-06-02 SURGERY — COLONOSCOPY WITH PROPOFOL
Anesthesia: General

## 2016-06-02 MED ORDER — SODIUM CHLORIDE 0.9 % IV SOLN
INTRAVENOUS | Status: DC
  Administered 2016-06-02: 1000 mL via INTRAVENOUS

## 2016-06-02 MED ORDER — MIDAZOLAM HCL 2 MG/2ML IJ SOLN
INTRAMUSCULAR | Status: DC | PRN
  Administered 2016-06-02: 1 mg via INTRAVENOUS

## 2016-06-02 MED ORDER — FENTANYL CITRATE (PF) 100 MCG/2ML IJ SOLN
INTRAMUSCULAR | Status: DC | PRN
  Administered 2016-06-02: 50 ug via INTRAVENOUS

## 2016-06-02 MED ORDER — EPHEDRINE SULFATE 50 MG/ML IJ SOLN
INTRAMUSCULAR | Status: DC | PRN
  Administered 2016-06-02: 5 mg via INTRAVENOUS

## 2016-06-02 MED ORDER — SODIUM CHLORIDE 0.9 % IV SOLN: INTRAVENOUS | Status: DC

## 2016-06-02 MED ORDER — PROPOFOL 500 MG/50ML IV EMUL
INTRAVENOUS | Status: DC | PRN
  Administered 2016-06-02: 120 ug/kg/min via INTRAVENOUS

## 2016-06-02 NOTE — H&P (Signed)
Outpatient short stay form Pre-procedure 06/02/2016 10:58 AM Lollie Sails MD  Primary Physician: [facility]  Reason for visit:  Colonoscopy  History of present illness:  Patient is a 75 year old male presenting today for a colonoscopy. He has a personal history of adenomatous colon polyps. The procedure was done in 2009. He tolerated his prep well. He does take dual antiplatelet treatment however stopped his Plavix a week ago. He continues on the 81 mg aspirin but did not take it today. No other aspirin products or blood thinning agents.    Current facility-administered medications:  .  0.9 %  sodium chloride infusion, , Intravenous, Continuous, Lollie Sails, MD, Last Rate: 20 mL/hr at 06/02/16 1052, 1,000 mL at 06/02/16 1052 .  0.9 %  sodium chloride infusion, , Intravenous, Continuous, Lollie Sails, MD  Prescriptions prior to admission  Medication Sig Dispense Refill Last Dose  . amLODipine (NORVASC) 10 MG tablet Take 10 mg by mouth daily.   06/02/2016 at 0800  . aspirin 81 MG tablet Take 81 mg by mouth daily.   06/01/2016 at Unknown time  . atorvastatin (LIPITOR) 40 MG tablet Take 40 mg by mouth at bedtime.   06/02/2016 at 0800  . cloNIDine (CATAPRES) 0.2 MG tablet Take 0.2 mg by mouth 2 (two) times daily.   06/01/2016 at Unknown time  . clopidogrel (PLAVIX) 75 MG tablet Take 75 mg by mouth daily.   Past Week at Unknown time  . colchicine 0.6 MG tablet Take 0.6 mg by mouth daily.   Past Week at Unknown time  . docusate sodium (COLACE) 100 MG capsule Take 100 mg by mouth 2 (two) times daily.   Past Week at Unknown time  . fluticasone (FLONASE) 50 MCG/ACT nasal spray Place into both nostrils daily.   06/02/2016 at Unknown time  . furosemide (LASIX) 20 MG tablet Take 20 mg by mouth 2 (two) times daily.   06/02/2016 at Unknown time  . hydrALAZINE (APRESOLINE) 10 MG tablet Take 10 mg by mouth 4 (four) times daily.   06/02/2016 at Unknown time  . hydrOXYzine  (ATARAX/VISTARIL) 25 MG tablet Take 25 mg by mouth every 6 (six) hours as needed for itching.   06/02/2016 at Unknown time  . insulin aspart (NOVOLOG) 100 UNIT/ML FlexPen Inject into the skin 3 (three) times daily with meals.   Past Week at Unknown time  . insulin detemir (LEVEMIR) 100 UNIT/ML injection Inject into the skin at bedtime.   Past Week at Unknown time  . isosorbide mononitrate (IMDUR) 120 MG 24 hr tablet Take 120 mg by mouth daily.   Past Week at Unknown time  . loratadine (CLARITIN) 10 MG tablet Take 10 mg by mouth daily.   06/01/2016 at Unknown time  . metoprolol succinate (TOPROL-XL) 100 MG 24 hr tablet Take 100 mg by mouth daily.   06/02/2016 at 0800  . metroNIDAZOLE (FLAGYL) 500 MG tablet Take 1 tablet (500 mg total) by mouth every 8 (eight) hours. 15 tablet 0 Past Week at Unknown time  . oxyCODONE-acetaminophen (PERCOCET/ROXICET) 5-325 MG per tablet Take 1 tablet by mouth 3 (three) times daily.   Past Week at Unknown time  . pantoprazole (PROTONIX) 40 MG tablet Take 1 tablet (40 mg total) by mouth daily. 40 mg BID for 1 month followed by once daily 60 tablet 0 06/01/2016 at Unknown time  . senna-docusate (SENOKOT-S) 8.6-50 MG per tablet Take 1 tablet by mouth at bedtime as needed for mild constipation.  30 tablet 0 06/01/2016 at Unknown time  . timolol (BETIMOL) 0.25 % ophthalmic solution 1-2 drops 2 (two) times daily.   06/01/2016 at Unknown time  . triamcinolone cream (KENALOG) 0.1 % Apply 1 application topically 2 (two) times daily as needed.    06/01/2016 at Unknown time  . ciprofloxacin (CIPRO) 250 MG tablet Take 1 tablet (250 mg total) by mouth 2 (two) times daily. (Patient not taking: Reported on 06/02/2016) 10 tablet 0 Not Taking at Unknown time     No Known Allergies   Past Medical History  Diagnosis Date  . Diabetes mellitus without complication ([place])   . Hypertension   . Gout   . Neuropathy ([place])   . CAD (coronary artery disease) of artery bypass graft   . Carotid  atherosclerosis   . CVA (cerebrovascular accident) ([place])   . GERD (gastroesophageal reflux disease)   . Esophageal ulcer   . Hyperlipidemia   . MI (myocardial infarction) ([place])   . Prostate CA ([place])   . Carotid stenosis   . Chronic kidney disease   . ED (erectile dysfunction)   . Hard of hearing   . Blood dyscrasia     prostate    Review of systems:      Physical Exam    Heart and lungs: Regular rate and rhythm without rub or gallop, lungs are bilaterally clear.    HEENT: Normocephalic atraumatic eyes are anicteric    Other:     Pertinant exam for procedure: Protuberant/obese, nontender bowel sounds are positive. Is not possible to palpate internal organs.    Planned proceedures: Colonoscopy and indicated procedures. I have discussed the risks benefits and complications of procedures to include not limited to bleeding, infection, perforation and the risk of sedation and the patient wishes to proceed.    Lollie Sails, MD Gastroenterology 06/02/2016  10:58 AM

## 2016-06-02 NOTE — Anesthesia Preprocedure Evaluation (Signed)
Anesthesia Evaluation  Patient identified by MRN, date of birth, ID band Patient awake    Reviewed: Allergy & Precautions, NPO status , Patient's Chart, lab work & pertinent test results, reviewed documented beta blocker date and time   Airway Mallampati: III  TM Distance: >3 FB     Dental  (+) Chipped   Pulmonary former smoker,           Cardiovascular hypertension, Pt. on medications and Pt. on home beta blockers + CAD, + Past MI and + Peripheral Vascular Disease       Neuro/Psych CVA    GI/Hepatic PUD, GERD  ,  Endo/Other  diabetes  Renal/GU Renal disease     Musculoskeletal   Abdominal   Peds  Hematology  (+) Blood dyscrasia, ,   Anesthesia Other Findings   Reproductive/Obstetrics                             Anesthesia Physical Anesthesia Plan  ASA: III  Anesthesia Plan: General   Post-op Pain Management:    Induction: Intravenous  Airway Management Planned: Nasal Cannula  Additional Equipment:   Intra-op Plan:   Post-operative Plan:   Informed Consent: I have reviewed the patients History and Physical, chart, labs and discussed the procedure including the risks, benefits and alternatives for the proposed anesthesia with the patient or authorized representative who has indicated his/her understanding and acceptance.     Plan Discussed with: CRNA  Anesthesia Plan Comments:         Anesthesia Quick Evaluation

## 2016-06-02 NOTE — Op Note (Signed)
[facility] Gastroenterology Patient Name: Cory Miller Procedure Date: 06/02/2016 10:58 AM MRN: [id number] Account #: [id number] Date of Birth: 04/24/1941 Admit Type: Outpatient Age: 75 Room: [facility] ENDO ROOM 1 Gender: Male Note Status: Finalized Procedure:            Colonoscopy Indications:          Personal history of colonic polyps Providers:            Lollie Sails, MD Referring MD:         Ria Bush (Referring MD) Medicines:            Monitored Anesthesia Care Complications:        After removal of multiple polyps, intial hemostasis                        seemed good, however on further observation, multiple                        of the polyp sites were not coagulating appropriately.                        4 hemostatic clips were placed at sites felt to be                        inadequately clotted or oozing excessively. All sites                        were noted to have adequate hemostasis on reevaluation. Procedure:            Pre-Anesthesia Assessment:                       - ASA Grade Assessment: III - A patient with severe                        systemic disease.                       After obtaining informed consent, the colonoscope was                        passed under direct vision. Throughout the procedure,                        the patient's blood pressure, pulse, and oxygen                        saturations were monitored continuously. The                        Colonoscope was introduced through the anus and                        advanced to the the cecum, identified by appendiceal                        orifice and ileocecal valve. The patient tolerated the                        procedure. The quality of the bowel preparation was  good. Findings:      Multiple small to medium diverticula were found in the sigmoid colon,       descending colon, transverse colon and ascending colon.      A 5 mm  polyp was found in the sigmoid colon. The polyp was sessile. The       polyp was removed with a cold snare. Resection and retrieval were       complete.      Two sessile polyps were found in the descending colon. The polyps were 3       mm in size. These polyps were removed with a cold snare and cold forcep.       Resection and retrieval were complete.      Four flat polyps were found in the splenic flexure. The polyps were 2 to       4 mm in size. These polyps were removed with a cold snare. Resection and       retrieval were complete.      Three sessile polyps were found in the transverse colon. The polyps were       2 to 4 mm in size. These polyps were removed with a cold snare.       Resection and retrieval were complete.      Multiple flat, pedunculated and sessile polyps were found in the sigmoid       colon, descending colon and transverse colon. The polyps were 2 to 7 mm       in size. Polypectomy was not attempted , as I felt he was still       anticoagulated excessively for the contunued proceedure.      The digital rectal exam was normal. Impression:           - Diverticulosis in the sigmoid colon, in the                        descending colon, in the transverse colon and in the                        ascending colon.                       - One 5 mm polyp in the sigmoid colon, removed with a                        cold snare. Resected and retrieved.                       - Two 3 mm polyps in the descending colon, removed with                        a cold snare. Resected and retrieved.                       - Four 2 to 4 mm polyps at the splenic flexure, removed                        with a cold snare. Resected and retrieved.                       - Three 2 to 4 mm polyps in the transverse colon,  removed with a cold snare. Resected and retrieved. Recommendation:       - Observe patient in GI recovery unit for possible                        discharge  same day.                       - Clear liquid diet for 3 days.                       - Full liquid diet for 3 days, then advance as                        tolerated to low residue diet for 5 days. Procedure Code(s):    --- Professional ---                       [phone], Colonoscopy, flexible; with removal of tumor(s),                        polyp(s), or other lesion(s) by snare technique Diagnosis Code(s):    --- Professional ---                       D12.5, Benign neoplasm of sigmoid colon                       D12.4, Benign neoplasm of descending colon                       D12.3, Benign neoplasm of transverse colon (hepatic                        flexure or splenic flexure)                       Z86.010, Personal history of colonic polyps                       K57.30, Diverticulosis of large intestine without                        perforation or abscess without bleeding CPT copyright 2016 American Medical Association. All rights reserved. The codes documented in this report are preliminary and upon coder review may  be revised to meet current compliance requirements. Lollie Sails, MD 06/02/2016 12:19:10 PM This report has been signed electronically. Number of Addenda: 0 Note Initiated On: 06/02/2016 10:58 AM Scope Withdrawal Time: 0 hours 22 minutes 30 seconds  Total Procedure Duration: 0 hours 52 minutes 56 seconds       [facility]

## 2016-06-02 NOTE — Anesthesia Procedure Notes (Signed)
Performed by: COOK-MARTIN, Serenitie Vinton Pre-anesthesia Checklist: Patient identified, Emergency Drugs available, Suction available, Patient being monitored and Timeout performed Patient Re-evaluated:Patient Re-evaluated prior to inductionOxygen Delivery Method: Simple face mask Preoxygenation: Pre-oxygenation with 100% oxygen Intubation Type: IV induction Placement Confirmation: positive ETCO2 and CO2 detector       

## 2016-06-04 ENCOUNTER — Encounter: Payer: Self-pay | Admitting: Gastroenterology

## 2016-06-05 ENCOUNTER — Encounter: Payer: Self-pay | Admitting: Gastroenterology

## 2016-06-05 LAB — SURGICAL PATHOLOGY

## 2016-06-05 NOTE — Transfer of Care (Signed)
Immediate Anesthesia Transfer of Care Note  Patient: Cory Miller  Procedure(s) Performed: Procedure(s): COLONOSCOPY WITH PROPOFOL (N/A)  Patient Location: PACU  Anesthesia Type:General  Level of Consciousness: awake and sedated  Airway & Oxygen Therapy: Patient Spontanous Breathing and Patient connected to nasal cannula oxygen  Post-op Assessment: Report given to RN and Post -op Vital signs reviewed and stable  Post vital signs: Reviewed and stable  Last Vitals:  Filed Vitals:   06/02/16 1401 06/02/16 1411  BP: 139/53 158/68  Pulse: 66 65  Temp:    Resp: 15 16    Last Pain: There were no vitals filed for this visit.       Complications: No apparent anesthesia complications

## 2016-06-05 NOTE — Anesthesia Postprocedure Evaluation (Signed)
Anesthesia Post Note  Patient: Cory Miller  Procedure(s) Performed: Procedure(s) (LRB): COLONOSCOPY WITH PROPOFOL (N/A)  Patient location during evaluation: Endoscopy Anesthesia Type: General Level of consciousness: awake and alert Pain management: pain level controlled Vital Signs Assessment: post-procedure vital signs reviewed and stable Respiratory status: spontaneous breathing, nonlabored ventilation, respiratory function stable and patient connected to nasal cannula oxygen Cardiovascular status: blood pressure returned to baseline and stable Postop Assessment: no signs of nausea or vomiting Anesthetic complications: no    Last Vitals:  Filed Vitals:   06/02/16 1401 06/02/16 1411  BP: 139/53 158/68  Pulse: 66 65  Temp:    Resp: 15 16    Last Pain: There were no vitals filed for this visit.               Cadel Stairs S

## 2016-07-25 ENCOUNTER — Emergency Department
Admission: EM | Admit: 2016-07-25 | Discharge: 2016-07-25 | Disposition: A | Discharge status: Home / Self Care | Payer: Medicare Other | Attending: Emergency Medicine | Admitting: Emergency Medicine

## 2016-07-25 ENCOUNTER — Emergency Department: Payer: Medicare Other

## 2016-07-25 ENCOUNTER — Encounter: Payer: Self-pay | Admitting: Emergency Medicine

## 2016-07-25 DIAGNOSIS — R0602 Shortness of breath: Secondary | ICD-10-CM | POA: Diagnosis not present

## 2016-07-25 DIAGNOSIS — I2510 Atherosclerotic heart disease of native coronary artery without angina pectoris: Secondary | ICD-10-CM | POA: Diagnosis not present

## 2016-07-25 DIAGNOSIS — Z951 Presence of aortocoronary bypass graft: Secondary | ICD-10-CM | POA: Diagnosis not present

## 2016-07-25 DIAGNOSIS — Z87891 Personal history of nicotine dependence: Secondary | ICD-10-CM | POA: Diagnosis not present

## 2016-07-25 DIAGNOSIS — I252 Old myocardial infarction: Secondary | ICD-10-CM | POA: Diagnosis not present

## 2016-07-25 DIAGNOSIS — R600 Localized edema: Secondary | ICD-10-CM | POA: Diagnosis present

## 2016-07-25 DIAGNOSIS — Z8546 Personal history of malignant neoplasm of prostate: Secondary | ICD-10-CM | POA: Diagnosis not present

## 2016-07-25 DIAGNOSIS — Z7982 Long term (current) use of aspirin: Secondary | ICD-10-CM | POA: Insufficient documentation

## 2016-07-25 DIAGNOSIS — Z79899 Other long term (current) drug therapy: Secondary | ICD-10-CM | POA: Insufficient documentation

## 2016-07-25 DIAGNOSIS — Z794 Long term (current) use of insulin: Secondary | ICD-10-CM | POA: Insufficient documentation

## 2016-07-25 DIAGNOSIS — N184 Chronic kidney disease, stage 4 (severe): Secondary | ICD-10-CM | POA: Insufficient documentation

## 2016-07-25 DIAGNOSIS — I129 Hypertensive chronic kidney disease with stage 1 through stage 4 chronic kidney disease, or unspecified chronic kidney disease: Secondary | ICD-10-CM | POA: Diagnosis not present

## 2016-07-25 DIAGNOSIS — E1122 Type 2 diabetes mellitus with diabetic chronic kidney disease: Secondary | ICD-10-CM | POA: Diagnosis not present

## 2016-07-25 DIAGNOSIS — I10 Essential (primary) hypertension: Secondary | ICD-10-CM

## 2016-07-25 DIAGNOSIS — Z791 Long term (current) use of non-steroidal anti-inflammatories (NSAID): Secondary | ICD-10-CM | POA: Insufficient documentation

## 2016-07-25 LAB — BASIC METABOLIC PANEL
ANION GAP: 7 (ref 5–15)
BUN: 28 mg/dL — AB (ref 6–20)
CO2: 22 mmol/L (ref 22–32)
Calcium: 8.3 mg/dL — ABNORMAL LOW (ref 8.9–10.3)
Chloride: 109 mmol/L (ref 101–111)
Creatinine, Ser: 2.78 mg/dL — ABNORMAL HIGH (ref 0.61–1.24)
GFR calc Af Amer: 24 mL/min — ABNORMAL LOW (ref >60)
GFR, EST NON AFRICAN AMERICAN: 21 mL/min — AB (ref >60)
GLUCOSE: 257 mg/dL — AB (ref 65–99)
POTASSIUM: 5.1 mmol/L (ref 3.5–5.1)
Sodium: 138 mmol/L (ref 135–145)

## 2016-07-25 LAB — CBC
HEMATOCRIT: 34.4 % — AB (ref 40.0–52.0)
HEMOGLOBIN: 11.7 g/dL — AB (ref 13.0–18.0)
MCH: 30.9 pg (ref 26.0–34.0)
MCHC: 33.9 g/dL (ref 32.0–36.0)
MCV: 91.3 fL (ref 80.0–100.0)
Platelets: 211 10*3/uL (ref 150–440)
RBC: 3.77 MIL/uL — ABNORMAL LOW (ref 4.40–5.90)
RDW: 14.6 % — AB (ref 11.5–14.5)
WBC: 8.6 10*3/uL (ref 3.8–10.6)

## 2016-07-25 LAB — BRAIN NATRIURETIC PEPTIDE: B NATRIURETIC PEPTIDE 5: 483 pg/mL — AB (ref 0.0–100.0)

## 2016-07-25 LAB — PROTIME-INR
INR: 0.92
Prothrombin Time: 12.3 seconds (ref 11.4–15.2)

## 2016-07-25 LAB — TROPONIN I
TROPONIN I: 0.09 ng/mL — AB (ref <0.03)
Troponin I: 0.1 ng/mL (ref <0.03)

## 2016-07-25 LAB — APTT: aPTT: <24 seconds — ABNORMAL LOW (ref 24–36)

## 2016-07-25 MED ORDER — IPRATROPIUM-ALBUTEROL 0.5-2.5 (3) MG/3ML IN SOLN
3.0000 mL | Freq: Once | RESPIRATORY_TRACT | Status: AC
  Administered 2016-07-25: 3 mL via RESPIRATORY_TRACT

## 2016-07-25 MED ORDER — IPRATROPIUM-ALBUTEROL 0.5-2.5 (3) MG/3ML IN SOLN
3.0000 mL | Freq: Once | RESPIRATORY_TRACT | Status: AC
  Administered 2016-07-25: 3 mL via RESPIRATORY_TRACT
  Filled 2016-07-25: qty 3

## 2016-07-25 MED ORDER — IPRATROPIUM-ALBUTEROL 0.5-2.5 (3) MG/3ML IN SOLN
RESPIRATORY_TRACT | Status: AC
  Administered 2016-07-25: 3 mL via RESPIRATORY_TRACT
  Filled 2016-07-25: qty 3

## 2016-07-25 NOTE — ED Triage Notes (Signed)
C/O hypertension.  Onset of symptoms today.  Initially seen at the clinic and BP improved.  Once home again felt BP high again.

## 2016-07-25 NOTE — ED Provider Notes (Signed)
[facility] Emergency Department Provider Note   ____________________________________________   First MD Initiated Contact with Patient 07/25/16 1714     (approximate)  I have reviewed the triage vital signs and the nursing notes.   HISTORY  Chief Complaint Hypertension   HPI Cory Miller is a 75 y.o. male patient with history of chronic edema and shortness of breath never walks more than maybe 10 feet at a time without getting short of breath. Patient seen in the office today had high blood pressure went home felt like his blood pressure went up again. Does not seem to have had any marked change in shortness of breath no apparent chest pain. History was made very difficult by his deafness. Patient's lying in bed partially sitting up breathing very hard just laying there. Oxygen sats are 100. He is not on oxygen  Past Medical History:  Diagnosis Date  . Blood dyscrasia    prostate  . CAD (coronary artery disease) of artery bypass graft   . Carotid atherosclerosis   . Carotid stenosis   . Chronic kidney disease   . CVA (cerebrovascular accident) ([place])   . Diabetes mellitus without complication ([place])   . ED (erectile dysfunction)   . Esophageal ulcer   . GERD (gastroesophageal reflux disease)   . Gout   . Hard of hearing   . Hyperlipidemia   . Hypertension   . MI (myocardial infarction) ([place])   . Neuropathy ([place])   . Prostate CA [facility])     Patient Active Problem List   Diagnosis Date Noted  . Sepsis ([place]) 04/04/2016  . Diabetes ([place]) 04/04/2016  . HTN (hypertension) 04/04/2016  . GERD (gastroesophageal reflux disease) 04/04/2016  . CAD (coronary artery disease) 04/04/2016  . CKD (chronic kidney disease), stage IV ([place]) 04/04/2016  . Abdominal pain 08/02/2015  . Abdominal pain, acute, epigastric   . Pain in the abdomen     Past Surgical History:  Procedure Laterality Date  . ARTERIAL BYPASS SURGRY    . CAROTID ENDARTERECTOMY Left   .  chest tubes x 2     for pneumothorax  . COLONOSCOPY WITH PROPOFOL N/A 06/02/2016   Procedure: COLONOSCOPY WITH PROPOFOL;  Surgeon: Lollie Sails, MD;  Location: [facility] ENDOSCOPY;  Service: Endoscopy;  Laterality: N/A;  . CORONARY ANGIOPLASTY    . CORONARY ARTERY BYPASS GRAFT    . decortication,parietal pleurectomy Left 05/2002  . ESOPHAGOGASTRODUODENOSCOPY N/A 08/05/2015   Procedure: ESOPHAGOGASTRODUODENOSCOPY (EGD);  Surgeon: Hulen Luster, MD;  Location: [facility] ENDOSCOPY;  Service: Endoscopy;  Laterality: N/A;  . ESOPHAGOGASTRODUODENOSCOPY (EGD) WITH PROPOFOL N/A 09/30/2015   Procedure: ESOPHAGOGASTRODUODENOSCOPY (EGD) WITH PROPOFOL;  Surgeon: Hulen Luster, MD;  Location: [facility] ENDOSCOPY;  Service: Gastroenterology;  Laterality: N/A;  . ostate cryoablation    . partial thoracoscopy  6/03    Prior to Admission medications   Medication Sig Start Date End Date Taking? Authorizing Provider  aspirin 81 MG tablet Take 81 mg by mouth daily.   Yes Historical Provider, MD  amLODipine (NORVASC) 10 MG tablet Take 10 mg by mouth daily.    Historical Provider, MD  atorvastatin (LIPITOR) 40 MG tablet Take 40 mg by mouth at bedtime.    Historical Provider, MD  cloNIDine (CATAPRES) 0.2 MG tablet Take 0.2 mg by mouth 2 (two) times daily.    Historical Provider, MD  clopidogrel (PLAVIX) 75 MG tablet Take 75 mg by mouth daily.    Historical Provider, MD  colchicine 0.6 MG tablet Take  0.6 mg by mouth daily.    Historical Provider, MD  docusate sodium (COLACE) 100 MG capsule Take 100 mg by mouth 2 (two) times daily.    Historical Provider, MD  fluticasone (FLONASE) 50 MCG/ACT nasal spray Place into both nostrils daily.    Historical Provider, MD  furosemide (LASIX) 20 MG tablet Take 20 mg by mouth 2 (two) times daily.    Historical Provider, MD  hydrALAZINE (APRESOLINE) 10 MG tablet Take 10 mg by mouth 4 (four) times daily.    Historical Provider, MD  hydrOXYzine (ATARAX/VISTARIL) 25 MG tablet Take 25 mg by mouth every 6  (six) hours as needed for itching.    Historical Provider, MD  insulin aspart (NOVOLOG) 100 UNIT/ML FlexPen Inject into the skin 3 (three) times daily with meals.    Historical Provider, MD  insulin detemir (LEVEMIR) 100 UNIT/ML injection Inject into the skin at bedtime.    Historical Provider, MD  isosorbide mononitrate (IMDUR) 120 MG 24 hr tablet Take 120 mg by mouth daily.    Historical Provider, MD  loratadine (CLARITIN) 10 MG tablet Take 10 mg by mouth daily.    Historical Provider, MD  metoprolol succinate (TOPROL-XL) 100 MG 24 hr tablet Take 100 mg by mouth daily.    Historical Provider, MD  oxyCODONE-acetaminophen (PERCOCET/ROXICET) 5-325 MG per tablet Take 1 tablet by mouth 3 (three) times daily.    Historical Provider, MD  pantoprazole (PROTONIX) 40 MG tablet Take 1 tablet (40 mg total) by mouth daily. 40 mg BID for 1 month followed by once daily 08/06/15   Max Sane, MD  senna-docusate (SENOKOT-S) 8.6-50 MG per tablet Take 1 tablet by mouth at bedtime as needed for mild constipation. 08/06/15   Max Sane, MD  timolol (BETIMOL) 0.25 % ophthalmic solution 1-2 drops 2 (two) times daily.    Historical Provider, MD  triamcinolone cream (KENALOG) 0.1 % Apply 1 application topically 2 (two) times daily as needed.     Historical Provider, MD    Allergies Review of patient's allergies indicates no known allergies.  Family History  Problem Relation Age of Onset  . CAD    . Diabetes    . Cancer    . Stroke      Social History Social History  Substance Use Topics  . Smoking status: Former Research scientist (life sciences)  . Smokeless tobacco: Never Used  . Alcohol use 0.0 oz/week    Review of Systems Constitutional: No fever/chills Eyes: No visual changes. ENT: No sore throat. Cardiovascular: Denies chest pain. Respiratory: See history of present illness Gastrointestinal: No abdominal pain.  No nausea, no vomiting.  No diarrhea.  No constipation.  10-point ROS otherwise  negative.  ____________________________________________   PHYSICAL EXAM:  VITAL SIGNS: ED Triage Vitals  Enc Vitals Group     BP 07/25/16 1552 (!) 147/102     Pulse Rate 07/25/16 1552 69     Resp 07/25/16 1552 18     Temp 07/25/16 1552 98.7 F (37.1 C)     Temp Source 07/25/16 1552 Oral     SpO2 07/25/16 1552 98 %     Weight 07/25/16 1552 300 lb (136.1 kg)     Height 07/25/16 1552 6\' 1"  (1.854 m)     Head Circumference --      Peak Flow --      Pain Score 07/25/16 1555 0     Pain Loc --      Pain Edu? --      Excl. in [place]? --  Constitutional: Alert and oriented. Looks chronically ill, short of breath Eyes: Conjunctivae are normal. PERRL. EOMI. Head: Atraumatic. Nose: No congestion/rhinnorhea. Mouth/Throat: Mucous membranes are moist.  Oropharynx non-erythematous. Neck: No stridor.   Cardiovascular: Normal rate, regular rhythm. Grossly normal heart sounds.  Good peripheral circulation. Respiratory: Normal respiratory effort.  No retractions. Lungs tight with wheezing throughout Gastrointestinal: Soft and nontender. No distention. No abdominal bruits. No CVA tenderness. Musculoskeletal: No lower extremity tenderness marked edema bilaterally  No joint effusions. Neurologic:  Normal speech and language. No gross focal neurologic deficits are appreciated. No gait instability. Skin:  Skin is warm, dry and intact. No rash noted. Psychiatric: Mood and affect are normal. Speech and behavior are normal.  ____________________________________________   LABS (all labs ordered are listed, but only abnormal results are displayed)  Labs Reviewed  BASIC METABOLIC PANEL - Abnormal; Notable for the following:       Result Value   Glucose, Bld 257 (*)    BUN 28 (*)    Creatinine, Ser 2.78 (*)    Calcium 8.3 (*)    GFR calc non Af Amer 21 (*)    GFR calc Af Amer 24 (*)    All other components within normal limits  CBC - Abnormal; Notable for the following:    RBC 3.77 (*)     Hemoglobin 11.7 (*)    HCT 34.4 (*)    RDW 14.6 (*)    All other components within normal limits  TROPONIN I - Abnormal; Notable for the following:    Troponin I 0.10 (*)    All other components within normal limits  APTT - Abnormal; Notable for the following:    aPTT <24 (*)    All other components within normal limits  TROPONIN I - Abnormal; Notable for the following:    Troponin I 0.09 (*)    All other components within normal limits  BRAIN NATRIURETIC PEPTIDE - Abnormal; Notable for the following:    B Natriuretic Peptide 483.0 (*)    All other components within normal limits  PROTIME-INR   ____________________________________________  EKG  EKG read and interpreted by me shows sinus rhythm with first-degree AV block rate of 68 left axis poor R-wave amplitude in poor R-wave progression in the lateral chest. ______________  EKG #2 sinus rhythm possibly first degree AV block again no acute changes similar to last 1 ______________________________  RADIOLOGY  Radiology reads the chest x-ray is stable cardiomegaly with no pulmonary edema not sure ____________________________________________   PROCEDURES    Procedures   ____________________________________________   INITIAL IMPRESSION / ASSESSMENT AND PLAN / ED COURSE  Pertinent labs & imaging results that were available during my care of the patient were reviewed by me and considered in my medical decision making (see chart for details).    Clinical Course     ____________________________________________   FINAL CLINICAL IMPRESSION(S) / ED DIAGNOSES  Final diagnoses:  Essential hypertension      NEW MEDICATIONS STARTED DURING THIS VISIT:  New Prescriptions   No medications on file     Note:  This document was prepared using Dragon voice recognition software and may include unintentional dictation errors.  Patient walked back and forth along the bed possibly 20 times rapidly without dropping his  oxygen saturation are raising his heart rate. Patient then sat down and was doing well. We will discharge him. I will talk to Dr. Tomasita Crumble shows an arrange some follow-up for him. Patient will return if any worse. Spoke  with Dr. Tomasita Crumble shows they will follow-up closely.   Nena Polio, MD 07/25/16 2031

## 2016-07-25 NOTE — ED Notes (Signed)
Pt able to ambulate without assistance for a full minute. Pts heart rate did not increase and SpO2 on RA did not drop below 90 %. Pt has increase RR at end of ambulating but is in no resp. distress.

## 2016-07-25 NOTE — ED Notes (Signed)
Pt assisted out of bed to use bathroom.

## 2016-07-25 NOTE — ED Notes (Addendum)
Pts family expressed doubts about sending patient home. Pts family worried about pt going to be home alone tonight. MD made aware and at bedside at this time.

## 2016-07-25 NOTE — Discharge Instructions (Signed)
Continue to take all your medicines as your doctors have instructed you. Make sure you exercise a little bit every day walking is very good exercise. Make sure he do so with someone in the house with you. Since you live by your self I would consider moving closer to the rest of your family or perhaps moving and with them.  Call Dr. Alveria Apley office in the morning and tell them you were in the ER and he didn't feel good for a while. They should follow you up closely. I spoke with Dr. Jacqualine Mau tonight.

## 2016-07-27 ENCOUNTER — Encounter: Payer: Self-pay | Admitting: *Deleted

## 2016-07-28 ENCOUNTER — Ambulatory Visit: Payer: Medicare Other | Admitting: Anesthesiology

## 2016-07-28 ENCOUNTER — Encounter: Payer: Self-pay | Admitting: *Deleted

## 2016-07-28 ENCOUNTER — Ambulatory Visit
Admission: RE | Admit: 2016-07-28 | Discharge: 2016-07-28 | Disposition: A | Discharge status: Home / Self Care | Payer: Medicare Other | Source: Ambulatory Visit | Attending: Gastroenterology | Admitting: Gastroenterology

## 2016-07-28 ENCOUNTER — Encounter
Admission: RE | Disposition: A | Discharge status: Home / Self Care | Payer: Self-pay | Source: Ambulatory Visit | Attending: Gastroenterology

## 2016-07-28 DIAGNOSIS — I2510 Atherosclerotic heart disease of native coronary artery without angina pectoris: Secondary | ICD-10-CM | POA: Insufficient documentation

## 2016-07-28 DIAGNOSIS — I129 Hypertensive chronic kidney disease with stage 1 through stage 4 chronic kidney disease, or unspecified chronic kidney disease: Secondary | ICD-10-CM | POA: Diagnosis not present

## 2016-07-28 DIAGNOSIS — E1140 Type 2 diabetes mellitus with diabetic neuropathy, unspecified: Secondary | ICD-10-CM | POA: Diagnosis not present

## 2016-07-28 DIAGNOSIS — Z79899 Other long term (current) drug therapy: Secondary | ICD-10-CM | POA: Diagnosis not present

## 2016-07-28 DIAGNOSIS — Z794 Long term (current) use of insulin: Secondary | ICD-10-CM | POA: Insufficient documentation

## 2016-07-28 DIAGNOSIS — D125 Benign neoplasm of sigmoid colon: Secondary | ICD-10-CM | POA: Diagnosis present

## 2016-07-28 DIAGNOSIS — E785 Hyperlipidemia, unspecified: Secondary | ICD-10-CM | POA: Diagnosis not present

## 2016-07-28 DIAGNOSIS — E1122 Type 2 diabetes mellitus with diabetic chronic kidney disease: Secondary | ICD-10-CM | POA: Insufficient documentation

## 2016-07-28 DIAGNOSIS — I252 Old myocardial infarction: Secondary | ICD-10-CM | POA: Diagnosis not present

## 2016-07-28 DIAGNOSIS — Z8546 Personal history of malignant neoplasm of prostate: Secondary | ICD-10-CM | POA: Insufficient documentation

## 2016-07-28 DIAGNOSIS — K219 Gastro-esophageal reflux disease without esophagitis: Secondary | ICD-10-CM | POA: Diagnosis not present

## 2016-07-28 DIAGNOSIS — D124 Benign neoplasm of descending colon: Secondary | ICD-10-CM | POA: Diagnosis not present

## 2016-07-28 DIAGNOSIS — N189 Chronic kidney disease, unspecified: Secondary | ICD-10-CM | POA: Insufficient documentation

## 2016-07-28 DIAGNOSIS — Z8673 Personal history of transient ischemic attack (TIA), and cerebral infarction without residual deficits: Secondary | ICD-10-CM | POA: Insufficient documentation

## 2016-07-28 DIAGNOSIS — Z7982 Long term (current) use of aspirin: Secondary | ICD-10-CM | POA: Insufficient documentation

## 2016-07-28 DIAGNOSIS — Z7902 Long term (current) use of antithrombotics/antiplatelets: Secondary | ICD-10-CM | POA: Insufficient documentation

## 2016-07-28 DIAGNOSIS — Z86010 Personal history of colonic polyps: Secondary | ICD-10-CM | POA: Diagnosis not present

## 2016-07-28 HISTORY — DX: Sleep apnea, unspecified: G47.30

## 2016-07-28 HISTORY — DX: Other specified abnormal findings of blood chemistry: R79.89

## 2016-07-28 HISTORY — PX: COLONOSCOPY WITH PROPOFOL: SHX5780

## 2016-07-28 HISTORY — DX: Occlusion and stenosis of bilateral carotid arteries: I65.23

## 2016-07-28 HISTORY — DX: Other specified abnormalities of plasma proteins: R77.8

## 2016-07-28 SURGERY — COLONOSCOPY WITH PROPOFOL
Anesthesia: General

## 2016-07-28 MED ORDER — PROPOFOL 10 MG/ML IV BOLUS
INTRAVENOUS | Status: DC | PRN
  Administered 2016-07-28: 80 mg via INTRAVENOUS

## 2016-07-28 MED ORDER — PROPOFOL 500 MG/50ML IV EMUL
INTRAVENOUS | Status: DC | PRN
  Administered 2016-07-28: 125 ug/kg/min via INTRAVENOUS

## 2016-07-28 MED ORDER — SODIUM CHLORIDE 0.9 % IV SOLN
INTRAVENOUS | Status: DC
  Administered 2016-07-28: 1000 mL via INTRAVENOUS

## 2016-07-28 MED ORDER — LIDOCAINE HCL (CARDIAC) 20 MG/ML IV SOLN
INTRAVENOUS | Status: DC | PRN
  Administered 2016-07-28: 30 mg via INTRAVENOUS

## 2016-07-28 MED ORDER — SODIUM CHLORIDE 0.9 % IV SOLN: INTRAVENOUS | Status: DC

## 2016-07-28 NOTE — Transfer of Care (Signed)
Immediate Anesthesia Transfer of Care Note  Patient: Cory Miller  Procedure(s) Performed: Procedure(s): COLONOSCOPY WITH PROPOFOL (N/A)  Patient Location: Endoscopy Unit  Anesthesia Type:General  Level of Consciousness: sedated  Airway & Oxygen Therapy: Patient Spontanous Breathing and Patient connected to nasal cannula oxygen  Post-op Assessment: Report given to RN and Post -op Vital signs reviewed and stable  Post vital signs: Reviewed and stable  Last Vitals:  Vitals:   07/28/16 0732 07/28/16 0817  BP: (!) 177/113 (!) 193/59  Pulse: 86   Resp: 18   Temp: 37.2 C     Last Pain:  Vitals:   07/28/16 0732  TempSrc: Tympanic         Complications: No apparent anesthesia complications

## 2016-07-28 NOTE — Anesthesia Preprocedure Evaluation (Signed)
Anesthesia Evaluation  Patient identified by MRN, date of birth, ID band Patient awake    Reviewed: Allergy & Precautions, NPO status , Patient's Chart, lab work & pertinent test results, reviewed documented beta blocker date and time   History of Anesthesia Complications Negative for: history of anesthetic complications  Airway Mallampati: III  TM Distance: >3 FB     Dental  (+) Chipped   Pulmonary shortness of breath and with exertion, sleep apnea and Continuous Positive Airway Pressure Ventilation , neg COPD, neg recent URI, former smoker,           Cardiovascular Exercise Tolerance: Poor hypertension, Pt. on medications and Pt. on home beta blockers (-) angina+ CAD, + Past MI, + CABG and + Peripheral Vascular Disease  (-) Cardiac Stents (-) dysrhythmias (-) Valvular Problems/Murmurs     Neuro/Psych neg Seizures CVA negative psych ROS   GI/Hepatic Neg liver ROS, PUD, GERD  ,  Endo/Other  diabetesMorbid obesity  Renal/GU CRFRenal disease     Musculoskeletal   Abdominal   Peds  Hematology  (+) Blood dyscrasia, ,   Anesthesia Other Findings Past Medical History: No date: Blood dyscrasia     Comment: prostate No date: CAD (coronary artery disease) of artery bypass* No date: Carotid atherosclerosis No date: Carotid stenosis No date: Chronic kidney disease No date: CVA (cerebrovascular accident) ([place]) No date: Diabetes mellitus without complication ([place]) No date: ED (erectile dysfunction) No date: Elevated troponin I level No date: Esophageal ulcer No date: GERD (gastroesophageal reflux disease) No date: Gout No date: Hard of hearing No date: Hyperlipidemia No date: Hypertension No date: MI (myocardial infarction) ([place]) No date: Neuropathy ([place]) No date: Occlusion of both internal carotid arteries No date: Prostate CA ([place]) No date: Sleep apnea   Reproductive/Obstetrics negative OB ROS                              Anesthesia Physical  Anesthesia Plan  ASA: III  Anesthesia Plan: General   Post-op Pain Management:    Induction: Intravenous  Airway Management Planned: Nasal Cannula  Additional Equipment:   Intra-op Plan:   Post-operative Plan:   Informed Consent: I have reviewed the patients History and Physical, chart, labs and discussed the procedure including the risks, benefits and alternatives for the proposed anesthesia with the patient or authorized representative who has indicated his/her understanding and acceptance.     Plan Discussed with: CRNA  Anesthesia Plan Comments:         Anesthesia Quick Evaluation

## 2016-07-28 NOTE — H&P (Signed)
Outpatient short stay form Pre-procedure 07/28/2016 8:19 AM Cory Sails MD  Primary Physician: [facility], Dr. Ria Bush  Reason for visit:  Colonoscopy  History of present illness:  Patient is a 75 year old male presenting today for colonoscopy. He did have a procedure on 06/02/2016 with the removal of 10 significant polyps however removal sites were bleeding more than usual. Procedure had to be terminated before completion due to this. He does take Plavix and aspirin.[name] He said for today's procedure he has held his Plavix for 7 days however he continued his aspirin 3 yesterday. He is presenting today for further evaluation and removal of other polyps noted but not removed.    Current Facility-Administered Medications:  .  0.9 %  sodium chloride infusion, , Intravenous, Continuous, Cory Sails, MD, Last Rate: 20 mL/hr at 07/28/16 0750, 1,000 mL at 07/28/16 0750 .  0.9 %  sodium chloride infusion, , Intravenous, Continuous, Cory Sails, MD  Prescriptions Prior to Admission  Medication Sig Dispense Refill Last Dose  . amLODipine (NORVASC) 10 MG tablet Take 10 mg by mouth daily.   07/27/2016 at Unknown time  . aspirin 81 MG tablet Take 81 mg by mouth daily.   07/27/2016 at Unknown time  . atorvastatin (LIPITOR) 40 MG tablet Take 40 mg by mouth at bedtime.   07/27/2016 at Unknown time  . cloNIDine (CATAPRES) 0.2 MG tablet Take 0.2 mg by mouth 3 (three) times daily.    07/27/2016 at Unknown time  . clopidogrel (PLAVIX) 75 MG tablet Take 75 mg by mouth daily.   Past Month at Unknown time  . colchicine 0.6 MG tablet Take 0.6 mg by mouth daily.   Past Week at Unknown time  . docusate sodium (COLACE) 100 MG capsule Take 100 mg by mouth 2 (two) times daily.   Past Week at Unknown time  . fluticasone (FLONASE) 50 MCG/ACT nasal spray Place into both nostrils daily.   Past Week at Unknown time  . furosemide (LASIX) 20 MG tablet Take 20 mg by mouth 2 (two)  times daily.   07/27/2016 at Unknown time  . hydrALAZINE (APRESOLINE) 10 MG tablet Take 10 mg by mouth 4 (four) times daily.   07/27/2016 at Unknown time  . hydrOXYzine (ATARAX/VISTARIL) 25 MG tablet Take 25 mg by mouth 2 (two) times daily.    07/27/2016 at Unknown time  . insulin aspart (NOVOLOG) 100 UNIT/ML FlexPen Inject into the skin 3 (three) times daily with meals.   Past Week at Unknown time  . insulin detemir (LEVEMIR) 100 UNIT/ML injection Inject into the skin at bedtime.   Past Week at Unknown time  . isosorbide mononitrate (IMDUR) 120 MG 24 hr tablet Take 120 mg by mouth daily.   07/27/2016 at Unknown time  . loratadine (CLARITIN) 10 MG tablet Take 10 mg by mouth daily.   Past Week at Unknown time  . metoprolol succinate (TOPROL-XL) 100 MG 24 hr tablet Take 100 mg by mouth daily.   07/27/2016 at Unknown time  . oxyCODONE-acetaminophen (PERCOCET/ROXICET) 5-325 MG per tablet Take 1 tablet by mouth 3 (three) times daily.   Past Week at Unknown time  . pantoprazole (PROTONIX) 40 MG tablet Take 1 tablet (40 mg total) by mouth daily. 40 mg BID for 1 month followed by once daily 60 tablet 0 07/27/2016 at Unknown time  . senna-docusate (SENOKOT-S) 8.6-50 MG per tablet Take 1 tablet by mouth at bedtime as needed for mild constipation. 30 tablet 0  Past Week at Unknown time  . timolol (BETIMOL) 0.25 % ophthalmic solution 1-2 drops 2 (two) times daily.   07/27/2016 at Unknown time  . triamcinolone cream (KENALOG) 0.1 % Apply 1 application topically 2 (two) times daily as needed.    Past Week at Unknown time     No Known Allergies   Past Medical History:  Diagnosis Date  . Blood dyscrasia    prostate  . CAD (coronary artery disease) of artery bypass graft   . Carotid atherosclerosis   . Carotid stenosis   . Chronic kidney disease   . CVA (cerebrovascular accident) ([place])   . Diabetes mellitus without complication ([place])   . ED (erectile dysfunction)   . Elevated troponin I level   . Esophageal  ulcer   . GERD (gastroesophageal reflux disease)   . Gout   . Hard of hearing   . Hyperlipidemia   . Hypertension   . MI (myocardial infarction) ([place])   . Neuropathy ([place])   . Occlusion of both internal carotid arteries   . Prostate CA ([place])   . Sleep apnea     Review of systems:      Physical Exam    Heart and lungs: Regular rate and rhythm bilaterally clear to auscultation    HEENT: Septic atraumatic eyes are anicteric    Other:     Pertinant exam for procedure: Obese soft nontender bowel sounds are positive    Planned proceedures: Colonoscopy and indicated procedures. I have discussed the risks benefits and complications of procedures to include not limited to bleeding, infection, perforation and the risk of sedation and the patient wishes to proceed.    Cory Sails, MD Gastroenterology 07/28/2016  8:19 AM

## 2016-07-28 NOTE — Anesthesia Postprocedure Evaluation (Signed)
Anesthesia Post Note  Patient: Cory Miller  Procedure(s) Performed: Procedure(s) (LRB): COLONOSCOPY WITH PROPOFOL (N/A)  Patient location during evaluation: Endoscopy Anesthesia Type: General Level of consciousness: awake and alert Pain management: pain level controlled Vital Signs Assessment: post-procedure vital signs reviewed and stable Respiratory status: spontaneous breathing, nonlabored ventilation, respiratory function stable and patient connected to nasal cannula oxygen Cardiovascular status: blood pressure returned to baseline and stable Postop Assessment: no signs of nausea or vomiting Anesthetic complications: no    Last Vitals:  Vitals:   07/28/16 0948 07/28/16 0958  BP: (!) 166/71 (!) 192/76  Pulse: 64 64  Resp: (!) 22 (!) 23  Temp:      Last Pain:  Vitals:   07/28/16 0928  TempSrc: Axillary  PainSc: Asleep                 Martha Clan

## 2016-07-28 NOTE — Op Note (Signed)
[facility] Patient Name: Cory Miller Procedure Date: 07/28/2016 8:26 AM MRN: [id number] Account #: [id number] Date of Birth: 1941-06-19 Admit Type: Outpatient Age: 75 Room: [facility] ENDO ROOM 3 Gender: Male Note Status: Finalized Procedure:            Colonoscopy Indications:          Personal history of colonic polyps Providers:            Lollie Sails, MD Referring MD:         No Local Md, MD (Referring MD) Medicines:            Monitored Anesthesia Care Complications:        No immediate complications. Procedure:            Pre-Anesthesia Assessment:                       - ASA Grade Assessment: III - A patient with severe                        systemic disease.                       After obtaining informed consent, the colonoscope was                        passed under direct vision. Throughout the procedure,                        the patient's blood pressure, pulse, and oxygen                        saturations were monitored continuously. The                        Colonoscope was introduced through the anus and                        advanced to the the cecum, identified by appendiceal                        orifice and ileocecal valve. The quality of the bowel                        preparation was good. Findings:      A 5 mm polyp was found in the mid sigmoid colon. The polyp was sessile.       The polyp was removed with a cold biopsy forceps. Resection and       retrieval were complete. To prevent bleeding after the polypectomy, one       hemostatic clip was successfully placed. There was no bleeding at the       end of the maneuver.      A 2 mm polyp was found in the distal descending colon. The polyp was       sessile. The polyp was removed with a cold biopsy forceps. Resection and       retrieval were complete.      Two sessile polyps were found in the mid descending colon. The polyps       were 3 mm in size. These  polyps were removed with a cold snare.  Resection was complete, but the polyp tissue was only partially       retrieved.      A 2 mm polyp was found in the mid descending colon. The polyp was       semi-sessile. The polyp was removed with a cold biopsy forceps.       Resection and retrieval were complete.      Five sessile polyps were found in the proximal descending colon. The       polyps were 3 to 6 mm in size. These polyps were removed with a cold       snare. Resection and retrieval were complete. To prevent bleeding after       the polypectomy, three hemostatic clips were successfully placed. There       was no bleeding at the end of the maneuver.      Two sessile polyps were found in the proximal descending colon. The       polyps were 2 to 3 mm in size. These polyps were removed with a cold       biopsy forceps. Resection and retrieval were complete. Impression:           - One 6 mm polyp in the mid sigmoid colon, removed with                        a cold snare. Resected and retrieved. Clip was placed.                       - One 2 mm polyp in the descending colon, removed with                        a cold biopsy forceps. Resected and retrieved.                       - One 5 mm polyp in the proximal descending colon,                        removed with a cold snare and removed with a cold                        biopsy forceps. Resected and retrieved. Clip was placed.                       - Three polyps in the proximal descending colon,                        removed with a cold snare. Resected and retrieved.                        Clips were placed.                       - Two 3 to 5 mm polyps in the mid descending colon,                        removed with a cold snare. Resected and retrieved. Recommendation:       - Discharge patient to home.                       - Clear liquid diet  for 2 days.                       - Full liquid diet for 2 days, then advance as                         tolerated to low residue diet for 5 days. Procedure Code(s):    --- Professional ---                       [phone], Colonoscopy, flexible; with removal of tumor(s),                        polyp(s), or other lesion(s) by snare technique                       [id number], 23, Colonoscopy, flexible; with biopsy, single                        or multiple Diagnosis Code(s):    --- Professional ---                       D12.5, Benign neoplasm of sigmoid colon                       D12.4, Benign neoplasm of descending colon                       Z86.010, Personal history of colonic polyps CPT copyright 2016 [employer]. All rights reserved. The codes documented in this report are preliminary and upon coder review may  be revised to meet current compliance requirements. Lollie Sails, MD 07/28/2016 9:40:30 AM This report has been signed electronically. Number of Addenda: 0 Note Initiated On: 07/28/2016 8:26 AM Scope Withdrawal Time: 0 hours 35 minutes 50 seconds  Total Procedure Duration: 0 hours 50 minutes 15 seconds       [facility]

## 2016-08-01 ENCOUNTER — Encounter: Payer: Self-pay | Admitting: Gastroenterology

## 2016-08-01 LAB — SURGICAL PATHOLOGY

## 2016-10-31 IMAGING — CR DG CHEST 1V PORT
1 series · 1 of 1 positions shown · non-contrast
Comparison: No priors.

CLINICAL DATA: 73-year-old male with abdominal pain for the past 2
days.

EXAM:
PORTABLE CHEST - 1 VIEW

[x chest ap]
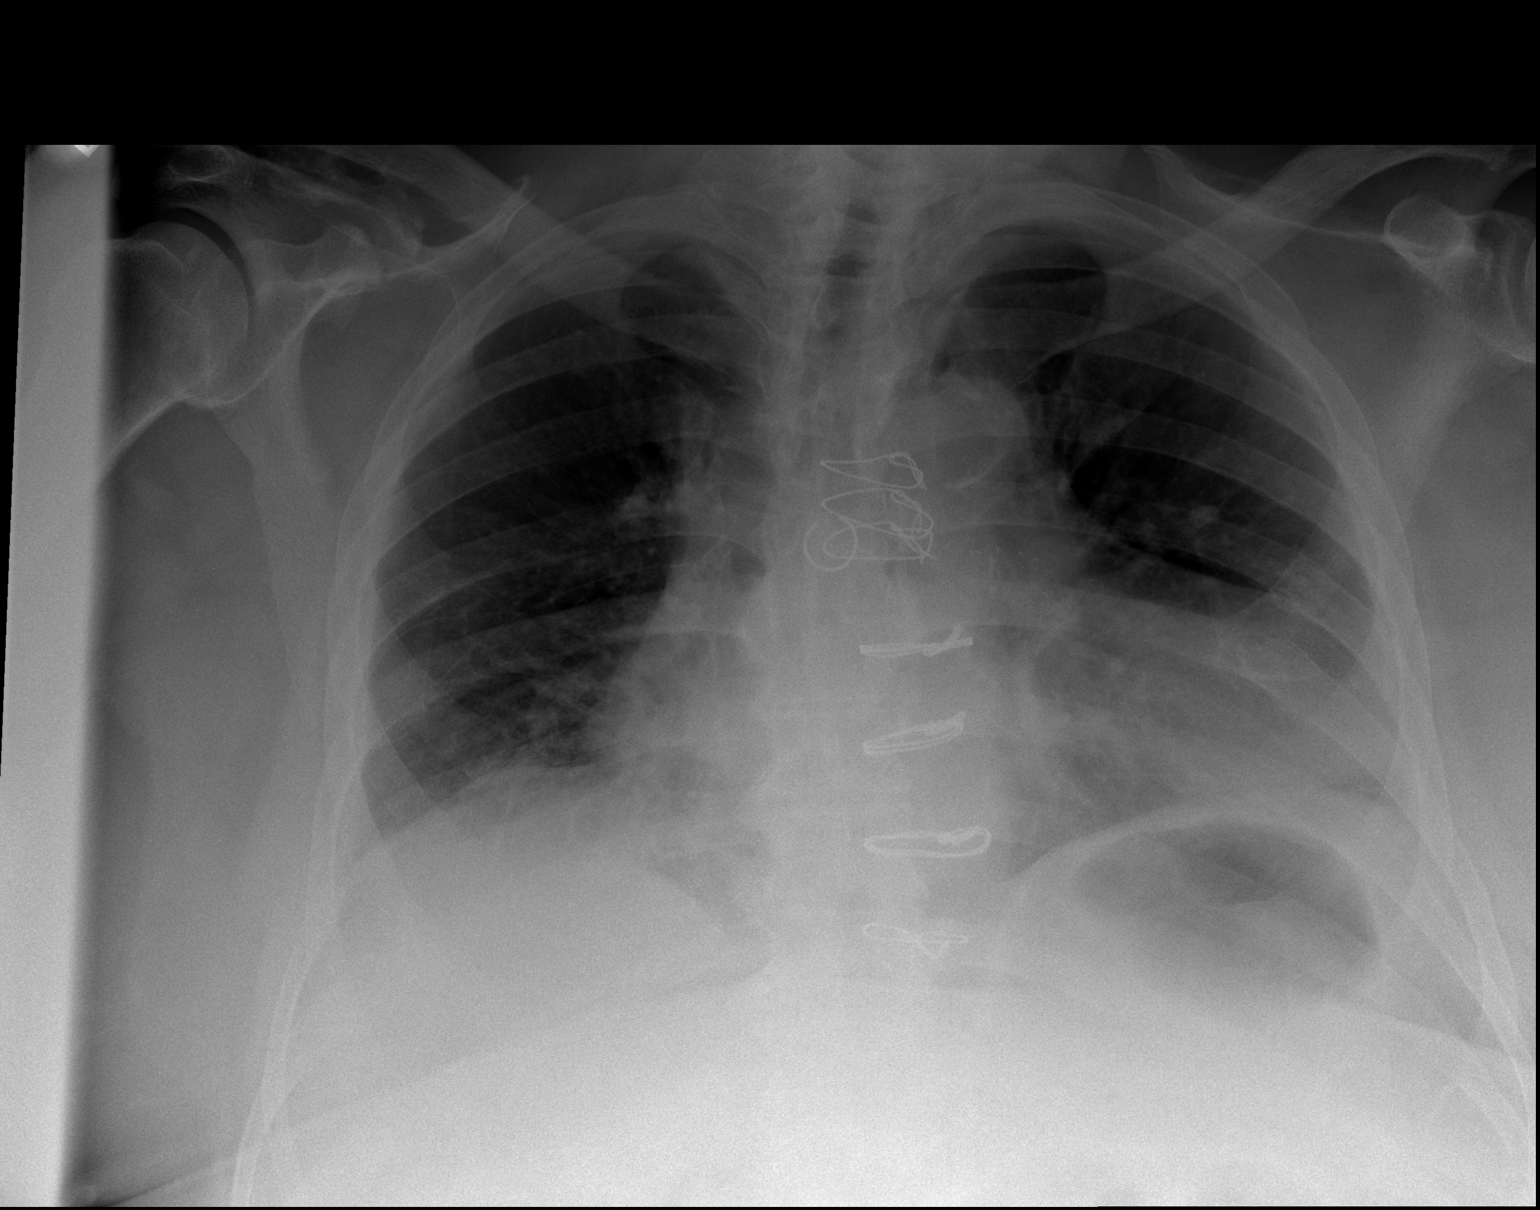

[1 of 1 positions shown; findings below may reference images not displayed]

FINDINGS: Lung volumes are low. There are bibasilar opacities that may reflect
areas of atelectasis and/or consolidation. Possible small bilateral
pleural effusions (left greater than right). No pneumothorax. No
evidence pulmonary edema. Mild cardiomegaly, accentuated by low lung
volumes. The patient is rotated to the left on today's exam,
resulting in distortion of the mediastinal contours and reduced
diagnostic sensitivity and specificity for mediastinal pathology.
Atherosclerosis in the thoracic aorta. Status post median sternotomy
for CABG.
IMPRESSION: 1. Low lung volumes with bibasilar areas of atelectasis and/or
consolidation and probable small bilateral pleural effusions.
2. Mild cardiomegaly.
3. Atherosclerosis.

## 2017-01-02 ENCOUNTER — Encounter: Payer: Self-pay | Admitting: Emergency Medicine

## 2017-01-02 ENCOUNTER — Emergency Department: Payer: Medicare PPO

## 2017-01-02 ENCOUNTER — Emergency Department
Admission: EM | Admit: 2017-01-02 | Discharge: 2017-01-02 | Disposition: A | Discharge status: Home / Self Care | Payer: Medicare PPO | Attending: Emergency Medicine | Admitting: Emergency Medicine

## 2017-01-02 ENCOUNTER — Ambulatory Visit
Admission: EM | Admit: 2017-01-02 | Discharge: 2017-01-02 | Disposition: A | Discharge status: Home / Self Care | Payer: Medicare PPO | Attending: Family Medicine | Admitting: Family Medicine

## 2017-01-02 DIAGNOSIS — I252 Old myocardial infarction: Secondary | ICD-10-CM | POA: Diagnosis not present

## 2017-01-02 DIAGNOSIS — N184 Chronic kidney disease, stage 4 (severe): Secondary | ICD-10-CM | POA: Insufficient documentation

## 2017-01-02 DIAGNOSIS — I2510 Atherosclerotic heart disease of native coronary artery without angina pectoris: Secondary | ICD-10-CM | POA: Diagnosis not present

## 2017-01-02 DIAGNOSIS — E876 Hypokalemia: Secondary | ICD-10-CM

## 2017-01-02 DIAGNOSIS — Z7982 Long term (current) use of aspirin: Secondary | ICD-10-CM | POA: Insufficient documentation

## 2017-01-02 DIAGNOSIS — Z87891 Personal history of nicotine dependence: Secondary | ICD-10-CM | POA: Insufficient documentation

## 2017-01-02 DIAGNOSIS — Z794 Long term (current) use of insulin: Secondary | ICD-10-CM | POA: Diagnosis not present

## 2017-01-02 DIAGNOSIS — R197 Diarrhea, unspecified: Secondary | ICD-10-CM | POA: Insufficient documentation

## 2017-01-02 DIAGNOSIS — R110 Nausea: Secondary | ICD-10-CM | POA: Insufficient documentation

## 2017-01-02 DIAGNOSIS — I129 Hypertensive chronic kidney disease with stage 1 through stage 4 chronic kidney disease, or unspecified chronic kidney disease: Secondary | ICD-10-CM | POA: Diagnosis not present

## 2017-01-02 DIAGNOSIS — R05 Cough: Secondary | ICD-10-CM | POA: Insufficient documentation

## 2017-01-02 DIAGNOSIS — Z79899 Other long term (current) drug therapy: Secondary | ICD-10-CM | POA: Insufficient documentation

## 2017-01-02 DIAGNOSIS — E1122 Type 2 diabetes mellitus with diabetic chronic kidney disease: Secondary | ICD-10-CM | POA: Insufficient documentation

## 2017-01-02 DIAGNOSIS — Z951 Presence of aortocoronary bypass graft: Secondary | ICD-10-CM | POA: Insufficient documentation

## 2017-01-02 DIAGNOSIS — R0981 Nasal congestion: Secondary | ICD-10-CM | POA: Insufficient documentation

## 2017-01-02 LAB — CBC WITH DIFFERENTIAL/PLATELET
BASOS ABS: 0.1 10*3/uL (ref 0–0.1)
BASOS PCT: 1 %
Basophils Absolute: 0.1 10*3/uL (ref 0–0.1)
Basophils Relative: 1 %
EOS ABS: 0.1 10*3/uL (ref 0–0.7)
EOS PCT: 1 %
Eosinophils Absolute: 0.1 10*3/uL (ref 0–0.7)
Eosinophils Relative: 1 %
HCT: 35.8 % — ABNORMAL LOW (ref 40.0–52.0)
HCT: 36.4 % — ABNORMAL LOW (ref 40.0–52.0)
Hemoglobin: 12 g/dL — ABNORMAL LOW (ref 13.0–18.0)
Hemoglobin: 12.4 g/dL — ABNORMAL LOW (ref 13.0–18.0)
Lymphocytes Relative: 12 %
Lymphocytes Relative: 13 %
Lymphs Abs: 1 10*3/uL (ref 1.0–3.6)
Lymphs Abs: 1.2 10*3/uL (ref 1.0–3.6)
MCH: 30.2 pg (ref 26.0–34.0)
MCH: 30.6 pg (ref 26.0–34.0)
MCHC: 33.5 g/dL (ref 32.0–36.0)
MCHC: 33.9 g/dL (ref 32.0–36.0)
MCV: 90.2 fL (ref 80.0–100.0)
MCV: 90.2 fL (ref 80.0–100.0)
Monocytes Absolute: 0.3 10*3/uL (ref 0.2–1.0)
Monocytes Absolute: 0.5 10*3/uL (ref 0.2–1.0)
Monocytes Relative: 4 %
Monocytes Relative: 6 %
Neutro Abs: 7.1 10*3/uL — ABNORMAL HIGH (ref 1.4–6.5)
Neutro Abs: 7.1 10*3/uL — ABNORMAL HIGH (ref 1.4–6.5)
Neutrophils Relative %: 82 %
Neutrophils Relative %: 79 %
PLATELETS: 281 10*3/uL (ref 150–440)
Platelets: 296 10*3/uL (ref 150–440)
RBC: 3.97 MIL/uL — ABNORMAL LOW (ref 4.40–5.90)
RBC: 4.04 MIL/uL — AB (ref 4.40–5.90)
RDW: 13.2 % (ref 11.5–14.5)
RDW: 12.8 % (ref 11.5–14.5)
WBC: 8.6 10*3/uL (ref 3.8–10.6)
WBC: 8.9 10*3/uL (ref 3.8–10.6)

## 2017-01-02 LAB — COMPREHENSIVE METABOLIC PANEL
ALT: 29 U/L (ref 17–63)
AST: 32 U/L (ref 15–41)
Albumin: 3.4 g/dL — ABNORMAL LOW (ref 3.5–5.0)
Alkaline Phosphatase: 155 U/L — ABNORMAL HIGH (ref 38–126)
Anion gap: 11 (ref 5–15)
BUN: 32 mg/dL — ABNORMAL HIGH (ref 6–20)
CO2: 22 mmol/L (ref 22–32)
Calcium: 8 mg/dL — ABNORMAL LOW (ref 8.9–10.3)
Chloride: 101 mmol/L (ref 101–111)
Creatinine, Ser: 3.04 mg/dL — ABNORMAL HIGH (ref 0.61–1.24)
GFR calc Af Amer: 22 mL/min — ABNORMAL LOW (ref >60)
GFR calc non Af Amer: 19 mL/min — ABNORMAL LOW (ref >60)
Glucose, Bld: 192 mg/dL — ABNORMAL HIGH (ref 65–99)
Potassium: 3.4 mmol/L — ABNORMAL LOW (ref 3.5–5.1)
Sodium: 134 mmol/L — ABNORMAL LOW (ref 135–145)
Total Bilirubin: 0.3 mg/dL (ref 0.3–1.2)
Total Protein: 7.2 g/dL (ref 6.5–8.1)

## 2017-01-02 LAB — BASIC METABOLIC PANEL
ANION GAP: 10 (ref 5–15)
BUN: 32 mg/dL — AB (ref 6–20)
CO2: 25 mmol/L (ref 22–32)
Calcium: 8.3 mg/dL — ABNORMAL LOW (ref 8.9–10.3)
Chloride: 103 mmol/L (ref 101–111)
Creatinine, Ser: 3.06 mg/dL — ABNORMAL HIGH (ref 0.61–1.24)
GFR calc Af Amer: 21 mL/min — ABNORMAL LOW (ref >60)
GFR, EST NON AFRICAN AMERICAN: 18 mL/min — AB (ref >60)
Glucose, Bld: 174 mg/dL — ABNORMAL HIGH (ref 65–99)
POTASSIUM: 3.8 mmol/L (ref 3.5–5.1)
SODIUM: 138 mmol/L (ref 135–145)

## 2017-01-02 LAB — TROPONIN I: Troponin I: 0.03 ng/mL (ref <0.03)

## 2017-01-02 MED ORDER — SODIUM CHLORIDE 0.9 % IV BOLUS (SEPSIS)
1000.0000 mL | Freq: Once | INTRAVENOUS | Status: AC
  Administered 2017-01-02: 1000 mL via INTRAVENOUS

## 2017-01-02 MED ORDER — ONDANSETRON HCL 4 MG/2ML IJ SOLN
4.0000 mg | Freq: Once | INTRAMUSCULAR | Status: AC
  Administered 2017-01-02: 4 mg via INTRAVENOUS
  Filled 2017-01-02: qty 2

## 2017-01-02 MED ORDER — ONDANSETRON 8 MG PO TBDP
8.0000 mg | ORAL_TABLET | Freq: Once | ORAL | Status: AC
  Administered 2017-01-02: 8 mg via ORAL

## 2017-01-02 MED ORDER — CLONIDINE HCL 0.1 MG PO TABS
0.2000 mg | ORAL_TABLET | Freq: Once | ORAL | Status: AC
  Administered 2017-01-02: 0.2 mg via ORAL
  Filled 2017-01-02: qty 2

## 2017-01-02 MED ORDER — CLONIDINE HCL 0.1 MG PO TABS
ORAL_TABLET | ORAL | Status: AC
  Filled 2017-01-02: qty 1

## 2017-01-02 MED ORDER — HYDRALAZINE HCL 10 MG PO TABS
10.0000 mg | ORAL_TABLET | Freq: Once | ORAL | Status: DC
  Filled 2017-01-02: qty 1

## 2017-01-02 NOTE — ED Triage Notes (Signed)
Pt c/o diarrhea all day yesterday and feeling like he is gonna throw up. He mentions that he has had a cold for about 2 weeks.

## 2017-01-02 NOTE — Discharge Instructions (Signed)
Drink plenty of fluids to prevent dehydration. Follow up with your doctor tomorrow to recheck your kidney function and vital signs. Return to the emergency room if you feel dizzy, developed abdominal pain, fever, or any other symptoms that were not present today.

## 2017-01-02 NOTE — ED Triage Notes (Signed)
Pt appears SHOB and labored. Reports always has SHOB and it is not worse than normal. Brother reports pt may have taken too much potassium; liquid and he just drinks some. Has had cough for 2 weeks.

## 2017-01-02 NOTE — ED Triage Notes (Signed)
Kidney function at 20% per brother

## 2017-01-02 NOTE — ED Triage Notes (Signed)
Pt c/o diarrhea since last night. Extremely hard of hearing. No blood in stool. No pain or vomiting. Has had some nausea.  No fever

## 2017-01-02 NOTE — ED Provider Notes (Signed)
CSN: [id number]     Arrival date & time 01/02/17  0940 History   First MD Initiated Contact with Patient 01/02/17 1103     Chief Complaint  Patient presents with  . Diarrhea   (Consider location/radiation/quality/duration/timing/severity/associated sxs/prior Treatment) HPI  76 year old male who last night started experiencing diarrhea every hour. He also is having palpitations so he took potassium supplement is described as a liquid on several occasions but does not remember the concentration or how much he took. Palpitations did subside. Prior to last night he's had 2 weeks of cough and nasal congestion. No nausea vomiting no shortness of breath no fever chills or body aches. No urinary symptoms. The diarrhea was watery and his had no blood or mucus in his stool. History of stage IV kidney disease in his usual is between 2.7 and 2.9 from the research I was able to perform. BUN today was 32. Unfortunately the patient does live by himself and has no good supervision at home.        Past Medical History:  Diagnosis Date  . Blood dyscrasia    prostate  . CAD (coronary artery disease) of artery bypass graft   . Carotid atherosclerosis   . Carotid stenosis   . Chronic kidney disease   . CVA (cerebrovascular accident) ([place])   . Diabetes mellitus without complication ([place])   . ED (erectile dysfunction)   . Elevated troponin I level   . Esophageal ulcer   . GERD (gastroesophageal reflux disease)   . Gout   . Hard of hearing   . Hyperlipidemia   . Hypertension   . MI (myocardial infarction)   . Neuropathy ([place])   . Occlusion of both internal carotid arteries   . Prostate CA ([place])   . Sleep apnea    Past Surgical History:  Procedure Laterality Date  . ARTERIAL BYPASS SURGRY    . CAROTID ENDARTERECTOMY Left   . chest tubes x 2     for pneumothorax  . COLONOSCOPY WITH PROPOFOL N/A 06/02/2016   Procedure: COLONOSCOPY WITH PROPOFOL;  Surgeon: Lollie Sails, MD;  Location: [facility]  ENDOSCOPY;  Service: Endoscopy;  Laterality: N/A;  . COLONOSCOPY WITH PROPOFOL N/A 07/28/2016   Procedure: COLONOSCOPY WITH PROPOFOL;  Surgeon: Lollie Sails, MD;  Location: [facility] ENDOSCOPY;  Service: Endoscopy;  Laterality: N/A;  . CORONARY ANGIOPLASTY    . CORONARY ARTERY BYPASS GRAFT    . decortication,parietal pleurectomy Left 05/2002  . ESOPHAGOGASTRODUODENOSCOPY N/A 08/05/2015   Procedure: ESOPHAGOGASTRODUODENOSCOPY (EGD);  Surgeon: Hulen Luster, MD;  Location: [facility] ENDOSCOPY;  Service: Endoscopy;  Laterality: N/A;  . ESOPHAGOGASTRODUODENOSCOPY (EGD) WITH PROPOFOL N/A 09/30/2015   Procedure: ESOPHAGOGASTRODUODENOSCOPY (EGD) WITH PROPOFOL;  Surgeon: Hulen Luster, MD;  Location: [facility] ENDOSCOPY;  Service: Gastroenterology;  Laterality: N/A;  . ostate cryoablation    . partial thoracoscopy  6/03   Family History  Problem Relation Age of Onset  . CAD    . Diabetes    . Cancer    . Stroke     Social History  Substance Use Topics  . Smoking status: Former Research scientist (life sciences)  . Smokeless tobacco: Never Used  . Alcohol use 0.0 oz/week    Review of Systems  Constitutional: Positive for activity change, chills and fatigue. Negative for fever.  HENT: Positive for congestion and hearing loss.   Gastrointestinal: Positive for abdominal pain and diarrhea. Negative for blood in stool and nausea.  All other systems reviewed and are negative.   Allergies  Patient  has no known allergies.  Home Medications   Prior to Admission medications   Medication Sig Start Date End Date Taking? Authorizing Provider  amLODipine (NORVASC) 10 MG tablet Take 10 mg by mouth daily.    Historical Provider, MD  aspirin 81 MG tablet Take 81 mg by mouth daily.    Historical Provider, MD  atorvastatin (LIPITOR) 40 MG tablet Take 40 mg by mouth at bedtime.    Historical Provider, MD  cloNIDine (CATAPRES) 0.2 MG tablet Take 0.2 mg by mouth 3 (three) times daily.     Historical Provider, MD  clopidogrel (PLAVIX) 75 MG tablet Take  75 mg by mouth daily.    Historical Provider, MD  colchicine 0.6 MG tablet Take 0.6 mg by mouth daily.    Historical Provider, MD  docusate sodium (COLACE) 100 MG capsule Take 100 mg by mouth 2 (two) times daily.    Historical Provider, MD  fluticasone (FLONASE) 50 MCG/ACT nasal spray Place into both nostrils daily.    Historical Provider, MD  furosemide (LASIX) 20 MG tablet Take 20 mg by mouth 2 (two) times daily.    Historical Provider, MD  hydrALAZINE (APRESOLINE) 10 MG tablet Take 10 mg by mouth 4 (four) times daily.    Historical Provider, MD  hydrOXYzine (ATARAX/VISTARIL) 25 MG tablet Take 25 mg by mouth 2 (two) times daily.     Historical Provider, MD  insulin aspart (NOVOLOG) 100 UNIT/ML FlexPen Inject into the skin 3 (three) times daily with meals.    Historical Provider, MD  insulin detemir (LEVEMIR) 100 UNIT/ML injection Inject into the skin at bedtime.    Historical Provider, MD  isosorbide mononitrate (IMDUR) 120 MG 24 hr tablet Take 120 mg by mouth daily.    Historical Provider, MD  loratadine (CLARITIN) 10 MG tablet Take 10 mg by mouth daily.    Historical Provider, MD  metoprolol succinate (TOPROL-XL) 100 MG 24 hr tablet Take 100 mg by mouth daily.    Historical Provider, MD  oxyCODONE-acetaminophen (PERCOCET/ROXICET) 5-325 MG per tablet Take 1 tablet by mouth 3 (three) times daily.    Historical Provider, MD  pantoprazole (PROTONIX) 40 MG tablet Take 1 tablet (40 mg total) by mouth daily. 40 mg BID for 1 month followed by once daily 08/06/15   Max Sane, MD  senna-docusate (SENOKOT-S) 8.6-50 MG per tablet Take 1 tablet by mouth at bedtime as needed for mild constipation. 08/06/15   Max Sane, MD  timolol (BETIMOL) 0.25 % ophthalmic solution 1-2 drops 2 (two) times daily.    Historical Provider, MD  triamcinolone cream (KENALOG) 0.1 % Apply 1 application topically 2 (two) times daily as needed.     Historical Provider, MD   Meds Ordered and Administered this Visit   Medications   ondansetron (ZOFRAN-ODT) disintegrating tablet 8 mg (8 mg Oral Given 01/02/17 1025)    BP (!) 180/58 (BP Location: Left Arm)   Pulse 74   Temp 98.5 F (36.9 C) (Oral)   Resp 20   SpO2 97%  No data found.   Physical Exam  Constitutional: He is oriented to person, place, and time. He appears well-developed and well-nourished. No distress.  HENT:  Head: Normocephalic and atraumatic.  Eyes: EOM are normal. Pupils are equal, round, and reactive to light.  Neck: Normal range of motion. Neck supple.  Pulmonary/Chest: Effort normal and breath sounds normal. No respiratory distress. He has no wheezes. He has no rales.  Abdominal: Soft.  Bowel sounds are very hypertonic. Abdomen is  distended but soft. Tenderness maximal and graded mild in the right lower quadrant. There is no rebound and no guarding.  Musculoskeletal: Normal range of motion.  Neurological: He is alert and oriented to person, place, and time.  Skin: Skin is warm and dry. He is not diaphoretic.  Psychiatric: He has a normal mood and affect. His behavior is normal. Judgment and thought content normal.  Nursing note and vitals reviewed.   Urgent Care Course     Procedures (including critical care time)  Labs Review Labs Reviewed  CBC WITH DIFFERENTIAL/PLATELET - Abnormal; Notable for the following:       Result Value   RBC 3.97 (*)    Hemoglobin 12.0 (*)    HCT 35.8 (*)    Neutro Abs 7.1 (*)    All other components within normal limits  COMPREHENSIVE METABOLIC PANEL - Abnormal; Notable for the following:    Sodium 134 (*)    Potassium 3.4 (*)    Glucose, Bld 192 (*)    BUN 32 (*)    Creatinine, Ser 3.04 (*)    Calcium 8.0 (*)    Albumin 3.4 (*)    Alkaline Phosphatase 155 (*)    GFR calc non Af Amer 19 (*)    GFR calc Af Amer 22 (*)    All other components within normal limits    Imaging Review No results found.   Visual Acuity Review  Right Eye Distance:   Left Eye Distance:   Bilateral Distance:     Right Eye Near:   Left Eye Near:    Bilateral Near:         MDM   1. Diarrhea, unspecified type   2. Hypokalemia    Discussed with the patient and his brother. I recommended that the patient be transferred to [facility] emergency department for further evaluation and care level higher than what we are able to provide. Concerned that the patient would not care for himself properly at home with the diarrhea his chronic kidney disease.    Lorin Picket, PA-C 01/02/17 1306

## 2017-01-02 NOTE — ED Provider Notes (Signed)
[facility] Emergency Department Provider Note  ____________________________________________  Time seen: Approximately 3:42 PM  I have reviewed the triage vital signs and the nursing notes.   HISTORY  Chief Complaint Diarrhea   HPI Cory Miller is a 76 y.o. male who presents for evaluation of diarrhea and nausea. Patient has complex past medical history as listed below. Started having diarrhea yesterday evening. Reports that she has had several watery bowel movements with no melena or blood since yesterday. Has had nausea but no vomiting. No fever or chills, no abdominal pain, no chest pain or shortness of breath. He has had cough and nasal congestion for 2 weeks but nobody aches or fever. Patient denies dizziness, chest pain or shortness of breath. He went to urgent care and was sent here for concerns of dehydration.Patient denies recent antibiotic use, history of C. difficile. He does report that he was hospitalized 3 weeks ago for chest pain.  Past Medical History:  Diagnosis Date  . Blood dyscrasia    prostate  . CAD (coronary artery disease) of artery bypass graft   . Carotid atherosclerosis   . Carotid stenosis   . Chronic kidney disease   . CVA (cerebrovascular accident) ([place])   . Diabetes mellitus without complication ([place])   . ED (erectile dysfunction)   . Elevated troponin I level   . Esophageal ulcer   . GERD (gastroesophageal reflux disease)   . Gout   . Hard of hearing   . Hyperlipidemia   . Hypertension   . MI (myocardial infarction)   . Neuropathy ([place])   . Occlusion of both internal carotid arteries   . Prostate CA ([place])   . Sleep apnea     Patient Active Problem List   Diagnosis Date Noted  . Sepsis ([place]) 04/04/2016  . Diabetes ([place]) 04/04/2016  . HTN (hypertension) 04/04/2016  . GERD (gastroesophageal reflux disease) 04/04/2016  . CAD (coronary artery disease) 04/04/2016  . CKD (chronic kidney disease), stage IV ([place])  04/04/2016  . Abdominal pain 08/02/2015  . Abdominal pain, acute, epigastric   . Pain in the abdomen     Past Surgical History:  Procedure Laterality Date  . ARTERIAL BYPASS SURGRY    . CAROTID ENDARTERECTOMY Left   . chest tubes x 2     for pneumothorax  . COLONOSCOPY WITH PROPOFOL N/A 06/02/2016   Procedure: COLONOSCOPY WITH PROPOFOL;  Surgeon: Lollie Sails, MD;  Location: [facility] ENDOSCOPY;  Service: Endoscopy;  Laterality: N/A;  . COLONOSCOPY WITH PROPOFOL N/A 07/28/2016   Procedure: COLONOSCOPY WITH PROPOFOL;  Surgeon: Lollie Sails, MD;  Location: [facility] ENDOSCOPY;  Service: Endoscopy;  Laterality: N/A;  . CORONARY ANGIOPLASTY    . CORONARY ARTERY BYPASS GRAFT    . decortication,parietal pleurectomy Left 05/2002  . ESOPHAGOGASTRODUODENOSCOPY N/A 08/05/2015   Procedure: ESOPHAGOGASTRODUODENOSCOPY (EGD);  Surgeon: Hulen Luster, MD;  Location: [facility] ENDOSCOPY;  Service: Endoscopy;  Laterality: N/A;  . ESOPHAGOGASTRODUODENOSCOPY (EGD) WITH PROPOFOL N/A 09/30/2015   Procedure: ESOPHAGOGASTRODUODENOSCOPY (EGD) WITH PROPOFOL;  Surgeon: Hulen Luster, MD;  Location: [facility] ENDOSCOPY;  Service: Gastroenterology;  Laterality: N/A;  . ostate cryoablation    . partial thoracoscopy  6/03    Prior to Admission medications   Medication Sig Start Date End Date Taking? Authorizing Provider  amLODipine (NORVASC) 10 MG tablet Take 10 mg by mouth daily.   Yes Historical Provider, MD  atorvastatin (LIPITOR) 40 MG tablet Take 40 mg by mouth at bedtime.   Yes Historical Provider,  MD  cloNIDine (CATAPRES) 0.2 MG tablet Take 0.2 mg by mouth 3 (three) times daily.    Yes Historical Provider, MD  clopidogrel (PLAVIX) 75 MG tablet Take 75 mg by mouth daily.   Yes Historical Provider, MD  docusate sodium (COLACE) 100 MG capsule Take 100 mg by mouth 2 (two) times daily.   Yes Historical Provider, MD  fluticasone (FLONASE) 50 MCG/ACT nasal spray Place into both nostrils daily.   Yes Historical Provider, MD  furosemide  (LASIX) 20 MG tablet Take 20 mg by mouth 2 (two) times daily.   Yes Historical Provider, MD  hydrOXYzine (ATARAX/VISTARIL) 25 MG tablet Take 25 mg by mouth 2 (two) times daily.    Yes Historical Provider, MD  isosorbide mononitrate (IMDUR) 120 MG 24 hr tablet Take 120 mg by mouth daily.   Yes Historical Provider, MD  loratadine (CLARITIN) 10 MG tablet Take 10 mg by mouth daily.   Yes Historical Provider, MD  metoprolol succinate (TOPROL-XL) 100 MG 24 hr tablet Take 100 mg by mouth daily.   Yes Historical Provider, MD  oxyCODONE-acetaminophen (PERCOCET/ROXICET) 5-325 MG per tablet Take 1 tablet by mouth 3 (three) times daily.   Yes Historical Provider, MD  pantoprazole (PROTONIX) 40 MG tablet Take 1 tablet (40 mg total) by mouth daily. 40 mg BID for 1 month followed by once daily 08/06/15  Yes Vipul Manuella Ghazi, MD  aspirin 81 MG tablet Take 81 mg by mouth daily.    Historical Provider, MD  colchicine 0.6 MG tablet Take 0.6 mg by mouth daily.    Historical Provider, MD  hydrALAZINE (APRESOLINE) 10 MG tablet Take 10 mg by mouth 4 (four) times daily.    Historical Provider, MD  insulin aspart (NOVOLOG) 100 UNIT/ML FlexPen Inject into the skin 3 (three) times daily with meals.    Historical Provider, MD  insulin detemir (LEVEMIR) 100 UNIT/ML injection Inject into the skin at bedtime.    Historical Provider, MD  senna-docusate (SENOKOT-S) 8.6-50 MG per tablet Take 1 tablet by mouth at bedtime as needed for mild constipation. 08/06/15   Max Sane, MD  timolol (BETIMOL) 0.25 % ophthalmic solution 1-2 drops 2 (two) times daily.    Historical Provider, MD  triamcinolone cream (KENALOG) 0.1 % Apply 1 application topically 2 (two) times daily as needed.     Historical Provider, MD    Allergies Patient has no known allergies.  Family History  Problem Relation Age of Onset  . CAD    . Diabetes    . Cancer    . Stroke      Social History Social History  Substance Use Topics  . Smoking status: Former Research scientist (life sciences)    . Smokeless tobacco: Never Used  . Alcohol use 0.0 oz/week    Review of Systems  Constitutional: Negative for fever. Eyes: Negative for visual changes. ENT: Negative for sore throat. Neck: No neck pain  Cardiovascular: Negative for chest pain. Respiratory: Negative for shortness of breath. Gastrointestinal: Negative for abdominal pain, vomiting. + diarrhea. Genitourinary: Negative for dysuria. Musculoskeletal: Negative for back pain. Skin: Negative for rash. Neurological: Negative for headaches, weakness or numbness. Psych: No SI or HI  ____________________________________________   PHYSICAL EXAM:  VITAL SIGNS: ED Triage Vitals  Enc Vitals Group     BP 01/02/17 1417 (!) 206/71     Pulse Rate 01/02/17 1414 69     Resp 01/02/17 1414 (!) 24     Temp 01/02/17 1414 98.1 F (36.7 C)     Temp  Source 01/02/17 1414 Oral     SpO2 01/02/17 1414 96 %     Weight 01/02/17 1412 270 lb (122.5 kg)     Height 01/02/17 1412 6\' 1"  (1.854 m)     Head Circumference --      Peak Flow --      Pain Score --      Pain Loc --      Pain Edu? --      Excl. in [place]? --     Constitutional: Alert and oriented. Well appearing and in no apparent distress. HEENT:      Head: Normocephalic and atraumatic.         Eyes: Conjunctivae are normal. Sclera is non-icteric. EOMI. PERRL      Mouth/Throat: Mucous membranes are moist.       Neck: Supple with no signs of meningismus. Cardiovascular: Regular rate and rhythm. No murmurs, gallops, or rubs. 2+ symmetrical distal pulses are present in all extremities. No JVD. Respiratory: Normal respiratory effort. Lungs are clear to auscultation bilaterally. No wheezes, crackles, or rhonchi.  Gastrointestinal: Soft, non tender, and non distended with positive bowel sounds. No rebound or guarding. Genitourinary: No CVA tenderness. Musculoskeletal: Nontender with normal range of motion in all extremities. No edema, cyanosis, or erythema of extremities. Neurologic:  Normal speech and language. Face is symmetric. Moving all extremities. No gross focal neurologic deficits are appreciated. Skin: Skin is warm, dry and intact. No rash noted. Psychiatric: Mood and affect are normal. Speech and behavior are normal.  ____________________________________________   LABS (all labs ordered are listed, but only abnormal results are displayed)  Labs Reviewed  CBC WITH DIFFERENTIAL/PLATELET - Abnormal; Notable for the following:       Result Value   RBC 4.04 (*)    Hemoglobin 12.4 (*)    HCT 36.4 (*)    Neutro Abs 7.1 (*)    All other components within normal limits  BASIC METABOLIC PANEL - Abnormal; Notable for the following:    Glucose, Bld 174 (*)    BUN 32 (*)    Creatinine, Ser 3.06 (*)    Calcium 8.3 (*)    GFR calc non Af Amer 18 (*)    GFR calc Af Amer 21 (*)    All other components within normal limits  TROPONIN I - Abnormal; Notable for the following:    Troponin I 0.03 (*)    All other components within normal limits  C DIFFICILE QUICK SCREEN W PCR REFLEX   ____________________________________________  EKG  ED ECG REPORT I, Rudene Re, the attending physician, personally viewed and interpreted this ECG.  Sinus rhythm with first-degree AV block, normal QRS and QTc intervals, left axis deviation, no ST elevations or depressions. ____________________________________________  RADIOLOGY  none  ____________________________________________   PROCEDURES  Procedure(s) performed: None Procedures Critical Care performed:  None ____________________________________________   INITIAL IMPRESSION / ASSESSMENT AND PLAN / ED COURSE  76 y.o. male who presents for evaluation of diarrhea and nausea. Patient is well-appearing, in no distress, vital signs showing elevated BP at 206/71. Patient reports that he took his antihypertensives this morning. Abdomen is soft and nontender. No signs of dehydration. Blood work showing stable creatinine  at 3.6. We'll give IV fluids and IV Zofran. We'll send stool for C. difficile culture as patient has had recent hospitalization.   ED COURSE:  Patient received IV fluids. Remained hemodynamically stable. No episodes of diarrhea in the emergency room. He is going to be discharged home with  close follow-up with PCP in the morning.  Pertinent labs & imaging results that were available during my care of the patient were reviewed by me and considered in my medical decision making (see chart for details).    ____________________________________________   FINAL CLINICAL IMPRESSION(S) / ED DIAGNOSES  Final diagnoses:  Diarrhea, unspecified type      NEW MEDICATIONS STARTED DURING THIS VISIT:  New Prescriptions   No medications on file     Note:  This document was prepared using Dragon voice recognition software and may include unintentional dictation errors.    Rudene Re, MD 01/02/17 2115

## 2018-06-03 ENCOUNTER — Ambulatory Visit (INDEPENDENT_AMBULATORY_CARE_PROVIDER_SITE_OTHER): Payer: Medicare PPO

## 2018-06-03 ENCOUNTER — Ambulatory Visit (INDEPENDENT_AMBULATORY_CARE_PROVIDER_SITE_OTHER): Payer: Medicare PPO | Admitting: Vascular Surgery

## 2018-06-03 ENCOUNTER — Other Ambulatory Visit (INDEPENDENT_AMBULATORY_CARE_PROVIDER_SITE_OTHER): Payer: Self-pay | Admitting: Vascular Surgery

## 2018-06-03 ENCOUNTER — Encounter (INDEPENDENT_AMBULATORY_CARE_PROVIDER_SITE_OTHER): Payer: Self-pay | Admitting: Vascular Surgery

## 2018-06-03 VITALS — BP 133/61 | HR 65 | Resp 16 | Ht 73.0 in | Wt 269.0 lb

## 2018-06-03 DIAGNOSIS — N184 Chronic kidney disease, stage 4 (severe): Secondary | ICD-10-CM

## 2018-06-03 DIAGNOSIS — E118 Type 2 diabetes mellitus with unspecified complications: Secondary | ICD-10-CM | POA: Diagnosis not present

## 2018-06-03 DIAGNOSIS — N186 End stage renal disease: Secondary | ICD-10-CM

## 2018-06-03 DIAGNOSIS — I159 Secondary hypertension, unspecified: Secondary | ICD-10-CM

## 2018-06-03 NOTE — Progress Notes (Signed)
Subjective:    Patient ID: Cory Miller, male    DOB: September 10, 1941, 77 y.o.   MRN: [id number] Chief Complaint  Patient presents with  . New Patient (Initial Visit)    Vein mapping/Consult ESRD   Presents as a new patient referred by Dr. Johnney Ou for evaluation of dialysis access creation.  Patient has a past medical history of chronic kidney disease.  Patient is hearing impaired and was seen with a family friend.  The patient's baseline creatinine has been in the 3.4-3.9 range.  The patient denies any uremic symptoms.  Denies any worsening edema of the bilateral lower extremity however has been advised by his nephrologist to wear compression socks.  The patient is severely hearing impaired however is able to read large print did words and able to respond in that manner.  Most recent GFR 16.  The patient is right-hand dominant.  The patient has not started dialysis as of yet.  The patient underwent bilateral upper extremity vein mapping which be amenable to the creation of a right brachiocephalic AV fistula.   Review of Systems  Constitutional: Negative.   HENT: Negative.   Eyes: Negative.   Respiratory: Negative.   Cardiovascular: Negative.   Gastrointestinal: Negative.   Endocrine: Negative.   Genitourinary:       Chronic kidney disease  Musculoskeletal: Negative.   Skin: Negative.   Allergic/Immunologic: Negative.   Neurological: Negative.   Hematological: Negative.   Psychiatric/Behavioral: Negative.       Objective:   Physical Exam  Constitutional: He is oriented to person, place, and time. He appears well-developed and well-nourished. No distress.  HENT:  Head: Normocephalic and atraumatic.  Right Ear: External ear normal.  Left Ear: External ear normal.  Eyes: Pupils are equal, round, and reactive to light. Conjunctivae and EOM are normal.  Neck: Normal range of motion.  Cardiovascular: Normal rate, regular rhythm, normal heart sounds and intact distal pulses.  Pulses:      Radial pulses are 2+ on the right side, and 2+ on the left side.  Pulmonary/Chest: Effort normal and breath sounds normal.  Musculoskeletal: Normal range of motion. He exhibits edema (Mild nonpitting bilateral edema noted).  Neurological: He is alert and oriented to person, place, and time.  Skin: Skin is warm and dry. He is not diaphoretic.  Psychiatric: He has a normal mood and affect. His behavior is normal. Judgment and thought content normal.  Vitals reviewed.  BP 133/61 (BP Location: Right Arm, Patient Position: Sitting)   Pulse 65   Resp 16   Ht 6\' 1"  (1.854 m)   Wt 269 lb (122 kg)   BMI 35.49 kg/m   Past Medical History:  Diagnosis Date  . Blood dyscrasia    prostate  . CAD (coronary artery disease) of artery bypass graft   . Carotid atherosclerosis   . Carotid stenosis   . Chronic kidney disease   . CVA (cerebrovascular accident) ([place])   . Diabetes mellitus without complication ([place])   . ED (erectile dysfunction)   . Elevated troponin I level   . Esophageal ulcer   . GERD (gastroesophageal reflux disease)   . Gout   . Hard of hearing   . Hyperlipidemia   . Hypertension   . MI (myocardial infarction) ([place])   . Neuropathy   . Occlusion of both internal carotid arteries   . Prostate CA ([place])   . Sleep apnea    Social History   Socioeconomic History  . Marital status: Divorced  Spouse name: Not on file  . Number of children: Not on file  . Years of education: Not on file  . Highest education level: Not on file  Occupational History  . Not on file  Social Needs  . Financial resource strain: Not on file  . Food insecurity:    Worry: Not on file    Inability: Not on file  . Transportation needs:    Medical: Not on file    Non-medical: Not on file  Tobacco Use  . Smoking status: Former Research scientist (life sciences)  . Smokeless tobacco: Never Used  Substance and Sexual Activity  . Alcohol use: Yes    Alcohol/week: 0.0 oz  . Drug use: No  . Sexual activity: Not on file   Lifestyle  . Physical activity:    Days per week: Not on file    Minutes per session: Not on file  . Stress: Not on file  Relationships  . Social connections:    Talks on phone: Not on file    Gets together: Not on file    Attends religious service: Not on file    Active member of club or organization: Not on file    Attends meetings of clubs or organizations: Not on file    Relationship status: Not on file  . Intimate partner violence:    Fear of current or ex partner: Not on file    Emotionally abused: Not on file    Physically abused: Not on file    Forced sexual activity: Not on file  Other Topics Concern  . Not on file  Social History Narrative  . Not on file   Past Surgical History:  Procedure Laterality Date  . ARTERIAL BYPASS SURGRY    . CAROTID ENDARTERECTOMY Left   . chest tubes x 2     for pneumothorax  . COLONOSCOPY WITH PROPOFOL N/A 06/02/2016   Procedure: COLONOSCOPY WITH PROPOFOL;  Surgeon: Lollie Sails, MD;  Location: [facility] ENDOSCOPY;  Service: Endoscopy;  Laterality: N/A;  . COLONOSCOPY WITH PROPOFOL N/A 07/28/2016   Procedure: COLONOSCOPY WITH PROPOFOL;  Surgeon: Lollie Sails, MD;  Location[facility] ENDOSCOPY;  Service: Endoscopy;  Laterality: N/A;  . CORONARY ANGIOPLASTY    . CORONARY ARTERY BYPASS GRAFT    . decortication,parietal pleurectomy Left 05/2002  . ESOPHAGOGASTRODUODENOSCOPY N/A 08/05/2015   Procedure: ESOPHAGOGASTRODUODENOSCOPY (EGD);  Surgeon: Hulen Luster, MD;  Location: [facility] ENDOSCOPY;  Service: Endoscopy;  Laterality: N/A;  . ESOPHAGOGASTRODUODENOSCOPY (EGD) WITH PROPOFOL N/A 09/30/2015   Procedure: ESOPHAGOGASTRODUODENOSCOPY (EGD) WITH PROPOFOL;  Surgeon: Hulen Luster, MD;  Location: [facility] ENDOSCOPY;  Service: Gastroenterology;  Laterality: N/A;  . ostate cryoablation    . partial thoracoscopy  6/03   Family History  Problem Relation Age of Onset  . CAD Unknown   . Diabetes Unknown   . Cancer Unknown   . Stroke Unknown    No Known  Allergies     Assessment & Plan:  Presents as a new patient referred by Dr. Johnney Ou for evaluation of dialysis access creation.  Patient has a past medical history of chronic kidney disease.  Patient is hearing impaired and was seen with a family friend.  The patient's baseline creatinine has been in the 3.4-3.9 range.  The patient denies any uremic symptoms.  Denies any worsening edema of the bilateral lower extremity however has been advised by his nephrologist to wear compression socks.  The patient is severely hearing impaired however is able to read large print did words  and able to respond in that manner.  Most recent GFR 16.  The patient is right-hand dominant.  The patient has not started dialysis as of yet.  The patient underwent bilateral upper extremity vein mapping which be amenable to the creation of a right brachiocephalic AV fistula.   1. CKD (chronic kidney disease), stage IV [facility]) - New Patient presents today for evaluation of dialysis access creation The patient is right-hand dominant however the veins located to the left upper extremity are too small for fistula creation. The patient is not a candidate for graft as he has not started dialysis yet and is unsure when in the future he may have to start. The patient's right upper extremity is amenable for right brachiocephalic AV fistula creation Procedure, risks and benefits explained to the patient and his family friend All questions answered. The patient wishes to proceed The patient understands that there is no blood pressures, blood draws or IVs to the right upper extremity starting now The patient also understands that his fistula will need to mature for 4 to 6 weeks before can be used  2. Type 2 diabetes mellitus with complication, unspecified whether long term insulin use (HCC) - Stable Encouraged good control as its slows the progression of atherosclerotic disease  3. Secondary hypertension - Stable Encouraged good control  as its slows the progression of atherosclerotic disease  Current Outpatient Medications on File Prior to Visit  Medication Sig Dispense Refill  . allopurinol (ZYLOPRIM) 100 MG tablet Take by mouth.    [name] amLODipine (NORVASC) 10 MG tablet Take 10 mg by mouth daily.    [name] aspirin 81 MG tablet Take 81 mg by mouth daily.    [name] atorvastatin (LIPITOR) 40 MG tablet Take 40 mg by mouth at bedtime.    . carvedilol (COREG) 6.25 MG tablet Take by mouth.    . Cholecalciferol (VITAMIN D3) 1000 units CAPS Take by mouth.    . cloNIDine (CATAPRES) 0.2 MG tablet Take 0.2 mg by mouth 3 (three) times daily.     . clopidogrel (PLAVIX) 75 MG tablet Take 75 mg by mouth daily.    [name] Cod Liver Oil CAPS Take by mouth.    . colchicine 0.6 MG tablet Take 0.6 mg by mouth daily.    [name] docusate sodium (COLACE) 100 MG capsule Take 100 mg by mouth 2 (two) times daily.    . dorzolamide-timolol (COSOPT) 22.3-6.8 MG/ML ophthalmic solution     . famotidine (PEPCID) 20 MG tablet Take by mouth.    . Flaxseed, Linseed, (FLAX SEED OIL) 1000 MG CAPS Take by mouth.    . fluticasone (FLONASE) 50 MCG/ACT nasal spray Place into both nostrils daily.    . furosemide (LASIX) 20 MG tablet Take 20 mg by mouth 2 (two) times daily.    . hydrALAZINE (APRESOLINE) 10 MG tablet Take 10 mg by mouth 4 (four) times daily.    . hydrOXYzine (ATARAX/VISTARIL) 25 MG tablet Take 25 mg by mouth 2 (two) times daily.     . insulin aspart (NOVOLOG) 100 UNIT/ML FlexPen Inject into the skin 3 (three) times daily with meals.    . insulin detemir (LEVEMIR) 100 UNIT/ML injection Inject into the skin at bedtime.    . isosorbide mononitrate (IMDUR) 120 MG 24 hr tablet Take 120 mg by mouth daily.    [name] ketoconazole (NIZORAL) 2 % shampoo     . loratadine (CLARITIN) 10 MG tablet Take 10 mg by mouth daily.    . metoprolol  succinate (TOPROL-XL) 100 MG 24 hr tablet Take 100 mg by mouth daily.    [name] oxyCODONE-acetaminophen (PERCOCET/ROXICET) 5-325 MG per tablet Take 1 tablet  by mouth 3 (three) times daily.    . pantoprazole (PROTONIX) 40 MG tablet Take 1 tablet (40 mg total) by mouth daily. 40 mg BID for 1 month followed by once daily 60 tablet 0  . senna-docusate (SENOKOT-S) 8.6-50 MG per tablet Take 1 tablet by mouth at bedtime as needed for mild constipation. 30 tablet 0  . sodium bicarbonate 650 MG tablet Take by mouth.    . timolol (BETIMOL) 0.25 % ophthalmic solution 1-2 drops 2 (two) times daily.    [name] triamcinolone cream (KENALOG) 0.1 % Apply 1 application topically 2 (two) times daily as needed.      No current facility-administered medications on file prior to visit.    There are no Patient Instructions on file for this visit. No follow-ups on file.  Austine Wiedeman A Nubia Ziesmer, PA-C

## 2018-06-05 ENCOUNTER — Encounter (INDEPENDENT_AMBULATORY_CARE_PROVIDER_SITE_OTHER): Payer: Self-pay

## 2018-06-05 ENCOUNTER — Other Ambulatory Visit (INDEPENDENT_AMBULATORY_CARE_PROVIDER_SITE_OTHER): Payer: Self-pay | Admitting: Vascular Surgery

## 2018-06-07 ENCOUNTER — Encounter
Admission: RE | Admit: 2018-06-07 | Discharge: 2018-06-07 | Disposition: A | Discharge status: Home / Self Care | Payer: Medicare PPO | Source: Ambulatory Visit | Attending: Vascular Surgery | Admitting: Vascular Surgery

## 2018-06-07 ENCOUNTER — Other Ambulatory Visit: Payer: Self-pay

## 2018-06-07 DIAGNOSIS — Z01810 Encounter for preprocedural cardiovascular examination: Secondary | ICD-10-CM | POA: Insufficient documentation

## 2018-06-07 DIAGNOSIS — Z01812 Encounter for preprocedural laboratory examination: Secondary | ICD-10-CM | POA: Diagnosis not present

## 2018-06-07 DIAGNOSIS — R9431 Abnormal electrocardiogram [ECG] [EKG]: Secondary | ICD-10-CM | POA: Diagnosis not present

## 2018-06-07 DIAGNOSIS — I440 Atrioventricular block, first degree: Secondary | ICD-10-CM | POA: Diagnosis not present

## 2018-06-07 HISTORY — DX: Dyspnea, unspecified: R06.00

## 2018-06-07 HISTORY — DX: Chronic kidney disease, stage 5: N18.5

## 2018-06-07 LAB — CBC WITH DIFFERENTIAL/PLATELET
BASOS ABS: 0.1 10*3/uL (ref 0–0.1)
BASOS PCT: 1 %
EOS PCT: 4 %
Eosinophils Absolute: 0.3 10*3/uL (ref 0–0.7)
HCT: 32.7 % — ABNORMAL LOW (ref 40.0–52.0)
Hemoglobin: 10.9 g/dL — ABNORMAL LOW (ref 13.0–18.0)
LYMPHS ABS: 1.2 10*3/uL (ref 1.0–3.6)
Lymphocytes Relative: 17 %
MCH: 30.7 pg (ref 26.0–34.0)
MCHC: 33.4 g/dL (ref 32.0–36.0)
MCV: 91.8 fL (ref 80.0–100.0)
Monocytes Absolute: 0.5 10*3/uL (ref 0.2–1.0)
Monocytes Relative: 7 %
NEUTROS PCT: 71 %
Neutro Abs: 5.1 10*3/uL (ref 1.4–6.5)
PLATELETS: 249 10*3/uL (ref 150–440)
RBC: 3.56 MIL/uL — ABNORMAL LOW (ref 4.40–5.90)
RDW: 15.7 % — ABNORMAL HIGH (ref 11.5–14.5)
WBC: 7.1 10*3/uL (ref 3.8–10.6)

## 2018-06-07 LAB — BASIC METABOLIC PANEL
Anion gap: 12 (ref 5–15)
BUN: 73 mg/dL — ABNORMAL HIGH (ref 8–23)
CHLORIDE: 107 mmol/L (ref 98–111)
CO2: 20 mmol/L — ABNORMAL LOW (ref 22–32)
CREATININE: 4.19 mg/dL — AB (ref 0.61–1.24)
Calcium: 8.3 mg/dL — ABNORMAL LOW (ref 8.9–10.3)
GFR, EST AFRICAN AMERICAN: 15 mL/min — AB (ref >60)
GFR, EST NON AFRICAN AMERICAN: 13 mL/min — AB (ref >60)
Glucose, Bld: 357 mg/dL — ABNORMAL HIGH (ref 70–99)
Potassium: 4.4 mmol/L (ref 3.5–5.1)
SODIUM: 139 mmol/L (ref 135–145)

## 2018-06-07 LAB — TYPE AND SCREEN
ABO/RH(D): A POS
Antibody Screen: NEGATIVE

## 2018-06-07 LAB — APTT: APTT: 26 s (ref 24–36)

## 2018-06-07 LAB — PROTIME-INR
INR: 0.96
Prothrombin Time: 12.7 seconds (ref 11.4–15.2)

## 2018-06-07 NOTE — Patient Instructions (Signed)
Your procedure is scheduled on: Friday 06/14/18 Report to [place]. To find out your arrival time please call [phone] between 1PM - 3PM on Thursday 06/13/18.  Remember: Instructions that are not followed completely may result in serious medical risk, up to and including death, or upon the discretion of your surgeon and anesthesiologist your surgery may need to be rescheduled.     _X__ 1. Do not eat food after midnight the night before your procedure.                 No gum chewing or hard candies. You may drink clear liquids up to 2 hours                 before you are scheduled to arrive for your surgery- DO not drink clear                 liquids within 2 hours of the start of your surgery.                 Clear Liquids include:  water, apple juice without pulp, clear carbohydrate                 drink such as Clearfast or Gatorade, Black Coffee or Tea (Do not add                 anything to coffee or tea).  __X__2.  On the morning of surgery brush your teeth with toothpaste and water, you                 may rinse your mouth with mouthwash if you wish.  Do not swallow any              toothpaste of mouthwash.     _X__ 3.  No Alcohol for 24 hours before or after surgery.   _X__ 4.  Do Not Smoke or use e-cigarettes For 24 Hours Prior to Your Surgery.                 Do not use any chewable tobacco products for at least 6 hours prior to                 surgery.  ____  5.  Bring all medications with you on the day of surgery if instructed.   __X__  6.  Notify your doctor if there is any change in your medical condition      (cold, fever, infections).     Do not wear jewelry, make-up, hairpins, clips or nail polish. Do not wear lotions, powders, or perfumes.  Do not shave 48 hours prior to surgery. Men may shave face and neck. Do not bring valuables to the hospital.    [facility] is not responsible for any belongings or  valuables.  Contacts, dentures/partials or body piercings may not be worn into surgery. Bring a case for your contacts, glasses or hearing aids, a denture cup will be supplied. Leave your suitcase in the car. After surgery it may be brought to your room. For patients admitted to the hospital, discharge time is determined by your treatment team.   Patients discharged the day of surgery will not be allowed to drive home.   Please read over the following fact sheets that you were given:   MRSA Information  __X__ Take these medicines the morning of surgery with A SIP OF WATER:  1. AMLODIPINE  2. CARVEDILOL  3. CLONIDINE  4. ISOSORBIDE  5. PANTOPRAZOLE  6.  ____ Fleet Enema (as directed)   __X__ Use CHG Soap/SAGE wipes as directed  ____ Use inhalers on the day of surgery  ____ Stop metformin/Janumet/Farxiga 2 days prior to surgery    __X__ Take 1/2 of usual insulin dose the night before surgery. No insulin the morning          of surgery. CUT EVENING DOSE FROM 35 UNITS TO 17 UNITS THE NIGHT BEFORE SURGERY  __X__ Stop Blood Thinners /Plavix/  on 06/09/18 AND CONTINUE YOUR ASPIRIN  __X__ Stop Anti-inflammatories 7 days before surgery such as Advil, Ibuprofen, Motrin,  BC or Goodies Powder, Naprosyn, Naproxen, Aleve, TYLENOL IS OK   __X__ Stop all herbal supplements, fish oil or vitamin E until after surgery.  STOP FLAXSEED AND COD LIVER OIL UNTIL AFTER PROCEDURE  __X__ Bring C-Pap to the hospital.

## 2018-06-08 NOTE — Pre-Procedure Instructions (Signed)
Met B and CBC results sent to Dr. Delana Meyer and Anesthesia for review.

## 2018-06-10 ENCOUNTER — Telehealth (INDEPENDENT_AMBULATORY_CARE_PROVIDER_SITE_OTHER): Payer: Self-pay

## 2018-06-10 NOTE — Telephone Encounter (Signed)
Called the patient and let a message for a return call.

## 2018-06-10 NOTE — Telephone Encounter (Signed)
Langley Gauss called from pre-admit testing and stated that the patients blood glucose was at a 357, and that now he will need to have medical clearance before he can have his upcoming procedure done. She states that if you have any questions to contact her in Pre-admit.

## 2018-06-11 NOTE — Pre-Procedure Instructions (Signed)
Faxed clearance form to Dr Nino Parsley office.

## 2018-06-13 MED ORDER — CEFAZOLIN SODIUM-DEXTROSE 2-4 GM/100ML-% IV SOLN
2.0000 g | INTRAVENOUS | Status: AC
  Administered 2018-06-14: 2 g via INTRAVENOUS

## 2018-06-13 NOTE — Pre-Procedure Instructions (Signed)
CLEARANCE BY PCP ON CHART WITH DIABETIC MEDICATION CHANGES

## 2018-06-14 ENCOUNTER — Encounter: Payer: Self-pay | Admitting: *Deleted

## 2018-06-14 ENCOUNTER — Ambulatory Visit
Admission: RE | Admit: 2018-06-14 | Discharge: 2018-06-14 | Disposition: A | Discharge status: Home / Self Care | Payer: Medicare PPO | Source: Ambulatory Visit | Attending: Vascular Surgery | Admitting: Vascular Surgery

## 2018-06-14 ENCOUNTER — Encounter
Admission: RE | Disposition: A | Discharge status: Home / Self Care | Payer: Self-pay | Source: Ambulatory Visit | Attending: Vascular Surgery

## 2018-06-14 ENCOUNTER — Ambulatory Visit: Payer: Medicare PPO | Admitting: Anesthesiology

## 2018-06-14 ENCOUNTER — Other Ambulatory Visit: Payer: Self-pay

## 2018-06-14 DIAGNOSIS — E1151 Type 2 diabetes mellitus with diabetic peripheral angiopathy without gangrene: Secondary | ICD-10-CM | POA: Diagnosis not present

## 2018-06-14 DIAGNOSIS — E1122 Type 2 diabetes mellitus with diabetic chronic kidney disease: Secondary | ICD-10-CM | POA: Insufficient documentation

## 2018-06-14 DIAGNOSIS — I252 Old myocardial infarction: Secondary | ICD-10-CM | POA: Insufficient documentation

## 2018-06-14 DIAGNOSIS — G4730 Sleep apnea, unspecified: Secondary | ICD-10-CM | POA: Insufficient documentation

## 2018-06-14 DIAGNOSIS — Z87891 Personal history of nicotine dependence: Secondary | ICD-10-CM | POA: Insufficient documentation

## 2018-06-14 DIAGNOSIS — I2510 Atherosclerotic heart disease of native coronary artery without angina pectoris: Secondary | ICD-10-CM | POA: Insufficient documentation

## 2018-06-14 DIAGNOSIS — Z7982 Long term (current) use of aspirin: Secondary | ICD-10-CM | POA: Insufficient documentation

## 2018-06-14 DIAGNOSIS — Z6835 Body mass index (BMI) 35.0-35.9, adult: Secondary | ICD-10-CM | POA: Diagnosis not present

## 2018-06-14 DIAGNOSIS — N186 End stage renal disease: Secondary | ICD-10-CM | POA: Diagnosis not present

## 2018-06-14 DIAGNOSIS — N529 Male erectile dysfunction, unspecified: Secondary | ICD-10-CM | POA: Diagnosis not present

## 2018-06-14 DIAGNOSIS — Z8673 Personal history of transient ischemic attack (TIA), and cerebral infarction without residual deficits: Secondary | ICD-10-CM | POA: Insufficient documentation

## 2018-06-14 DIAGNOSIS — E785 Hyperlipidemia, unspecified: Secondary | ICD-10-CM | POA: Diagnosis not present

## 2018-06-14 DIAGNOSIS — E1140 Type 2 diabetes mellitus with diabetic neuropathy, unspecified: Secondary | ICD-10-CM | POA: Insufficient documentation

## 2018-06-14 DIAGNOSIS — Z9989 Dependence on other enabling machines and devices: Secondary | ICD-10-CM | POA: Diagnosis not present

## 2018-06-14 DIAGNOSIS — Z79899 Other long term (current) drug therapy: Secondary | ICD-10-CM | POA: Diagnosis not present

## 2018-06-14 DIAGNOSIS — Z8711 Personal history of peptic ulcer disease: Secondary | ICD-10-CM | POA: Diagnosis not present

## 2018-06-14 DIAGNOSIS — I120 Hypertensive chronic kidney disease with stage 5 chronic kidney disease or end stage renal disease: Secondary | ICD-10-CM | POA: Insufficient documentation

## 2018-06-14 DIAGNOSIS — K219 Gastro-esophageal reflux disease without esophagitis: Secondary | ICD-10-CM | POA: Diagnosis not present

## 2018-06-14 DIAGNOSIS — Z7902 Long term (current) use of antithrombotics/antiplatelets: Secondary | ICD-10-CM | POA: Diagnosis not present

## 2018-06-14 DIAGNOSIS — Z8546 Personal history of malignant neoplasm of prostate: Secondary | ICD-10-CM | POA: Insufficient documentation

## 2018-06-14 HISTORY — PX: AV FISTULA PLACEMENT: SHX1204

## 2018-06-14 LAB — POCT I-STAT 4, (NA,K, GLUC, HGB,HCT)
Glucose, Bld: 230 mg/dL — ABNORMAL HIGH (ref 70–99)
HEMATOCRIT: 30 % — AB (ref 39.0–52.0)
HEMOGLOBIN: 10.2 g/dL — AB (ref 13.0–17.0)
Potassium: 4.2 mmol/L (ref 3.5–5.1)
Sodium: 140 mmol/L (ref 135–145)

## 2018-06-14 LAB — ABO/RH: ABO/RH(D): A POS

## 2018-06-14 LAB — GLUCOSE, CAPILLARY
GLUCOSE-CAPILLARY: 191 mg/dL — AB (ref 70–99)
Glucose-Capillary: 211 mg/dL — ABNORMAL HIGH (ref 70–99)

## 2018-06-14 SURGERY — ARTERIOVENOUS (AV) FISTULA CREATION
Anesthesia: General | Site: Arm Upper | Laterality: Right | Wound class: Clean

## 2018-06-14 MED ORDER — BUPIVACAINE HCL (PF) 0.5 % IJ SOLN
INTRAMUSCULAR | Status: AC
  Filled 2018-06-14: qty 30

## 2018-06-14 MED ORDER — FENTANYL CITRATE (PF) 100 MCG/2ML IJ SOLN: 25.0000 ug | INTRAMUSCULAR | Status: DC | PRN

## 2018-06-14 MED ORDER — PROPOFOL 10 MG/ML IV BOLUS
INTRAVENOUS | Status: AC
  Filled 2018-06-14: qty 20

## 2018-06-14 MED ORDER — BUPIVACAINE HCL (PF) 0.5 % IJ SOLN
INTRAMUSCULAR | Status: DC | PRN
  Administered 2018-06-14: 10 mL

## 2018-06-14 MED ORDER — HEPARIN SODIUM (PORCINE) 5000 UNIT/ML IJ SOLN
INTRAMUSCULAR | Status: AC
  Filled 2018-06-14: qty 1

## 2018-06-14 MED ORDER — CEFAZOLIN SODIUM-DEXTROSE 2-4 GM/100ML-% IV SOLN
INTRAVENOUS | Status: AC
  Filled 2018-06-14: qty 100

## 2018-06-14 MED ORDER — ONDANSETRON HCL 4 MG/2ML IJ SOLN
INTRAMUSCULAR | Status: DC | PRN
  Administered 2018-06-14: 4 mg via INTRAVENOUS

## 2018-06-14 MED ORDER — FENTANYL CITRATE (PF) 100 MCG/2ML IJ SOLN
INTRAMUSCULAR | Status: AC
  Filled 2018-06-14: qty 2

## 2018-06-14 MED ORDER — SODIUM CHLORIDE 0.9 % IV SOLN
INTRAVENOUS | Status: DC | PRN
  Administered 2018-06-14: 50 mL via INTRAMUSCULAR

## 2018-06-14 MED ORDER — SODIUM CHLORIDE 0.9 % IV SOLN
INTRAVENOUS | Status: DC
  Administered 2018-06-14: 09:00:00 via INTRAVENOUS

## 2018-06-14 MED ORDER — HYDROCODONE-ACETAMINOPHEN 5-325 MG PO TABS: 1.0000 | ORAL_TABLET | Freq: Four times a day (QID) | ORAL | 0 refills | Status: DC | PRN

## 2018-06-14 MED ORDER — PHENYLEPHRINE HCL 10 MG/ML IJ SOLN
INTRAMUSCULAR | Status: DC | PRN
  Administered 2018-06-14: 20 ug/min via INTRAVENOUS

## 2018-06-14 MED ORDER — PHENYLEPHRINE HCL 10 MG/ML IJ SOLN
INTRAMUSCULAR | Status: DC | PRN
  Administered 2018-06-14 (×2): 50 ug via INTRAVENOUS

## 2018-06-14 MED ORDER — PHENYLEPHRINE HCL 10 MG/ML IJ SOLN: INTRAMUSCULAR | Status: DC | PRN

## 2018-06-14 MED ORDER — CHLORHEXIDINE GLUCONATE CLOTH 2 % EX PADS: 6.0000 | MEDICATED_PAD | Freq: Once | CUTANEOUS | Status: DC

## 2018-06-14 MED ORDER — FENTANYL CITRATE (PF) 100 MCG/2ML IJ SOLN
INTRAMUSCULAR | Status: DC | PRN
  Administered 2018-06-14 (×5): 50 ug via INTRAVENOUS

## 2018-06-14 MED ORDER — PROPOFOL 10 MG/ML IV BOLUS
INTRAVENOUS | Status: DC | PRN
  Administered 2018-06-14: 200 mg via INTRAVENOUS

## 2018-06-14 MED ORDER — ONDANSETRON HCL 4 MG/2ML IJ SOLN: 4.0000 mg | Freq: Once | INTRAMUSCULAR | Status: DC | PRN

## 2018-06-14 MED ORDER — LIDOCAINE HCL (CARDIAC) PF 100 MG/5ML IV SOSY
PREFILLED_SYRINGE | INTRAVENOUS | Status: DC | PRN
  Administered 2018-06-14: 100 mg via INTRAVENOUS

## 2018-06-14 MED ORDER — EVICEL 2 ML EX KIT
PACK | CUTANEOUS | Status: AC
  Filled 2018-06-14: qty 1

## 2018-06-14 MED ORDER — ONDANSETRON HCL 4 MG/2ML IJ SOLN
INTRAMUSCULAR | Status: AC
  Filled 2018-06-14: qty 2

## 2018-06-14 MED ORDER — LIDOCAINE HCL (PF) 2 % IJ SOLN
INTRAMUSCULAR | Status: AC
  Filled 2018-06-14: qty 10

## 2018-06-14 SURGICAL SUPPLY — 57 items
APPLIER CLIP 11 MED OPEN (CLIP)
APPLIER CLIP 9.375 SM OPEN (CLIP)
BAG DECANTER FOR FLEXI CONT (MISCELLANEOUS) ×3 IMPLANT
BLADE SURG SZ11 CARB STEEL (BLADE) ×3 IMPLANT
BOOT SUTURE AID YELLOW STND (SUTURE) ×3 IMPLANT
BRUSH SCRUB EZ  4% CHG (MISCELLANEOUS) ×2
BRUSH SCRUB EZ 4% CHG (MISCELLANEOUS) ×1 IMPLANT
CANISTER SUCT 1200ML W/VALVE (MISCELLANEOUS) ×3 IMPLANT
CHLORAPREP W/TINT 26ML (MISCELLANEOUS) ×3 IMPLANT
CLIP APPLIE 11 MED OPEN (CLIP) IMPLANT
CLIP APPLIE 9.375 SM OPEN (CLIP) IMPLANT
DERMABOND ADVANCED (GAUZE/BANDAGES/DRESSINGS) ×2
DERMABOND ADVANCED .7 DNX12 (GAUZE/BANDAGES/DRESSINGS) ×1 IMPLANT
DRAPE IMP U-DRAPE 54X76 (DRAPES) ×3 IMPLANT
DRESSING SURGICEL FIBRLLR 1X2 (HEMOSTASIS) ×1 IMPLANT
DRSG SURGICEL FIBRILLAR 1X2 (HEMOSTASIS) ×3
ELECT CAUTERY BLADE 6.4 (BLADE) ×3 IMPLANT
ELECT REM PT RETURN 9FT ADLT (ELECTROSURGICAL) ×3
ELECTRODE REM PT RTRN 9FT ADLT (ELECTROSURGICAL) ×1 IMPLANT
GEL ULTRASOUND 20GR AQUASONIC (MISCELLANEOUS) IMPLANT
GLOVE BIO SURGEON STRL SZ7 (GLOVE) ×9 IMPLANT
GLOVE INDICATOR 7.5 STRL GRN (GLOVE) ×9 IMPLANT
GLOVE SURG SYN 8.0 (GLOVE) ×3 IMPLANT
GOWN STRL REUS W/ TWL LRG LVL3 (GOWN DISPOSABLE) ×2 IMPLANT
GOWN STRL REUS W/ TWL XL LVL3 (GOWN DISPOSABLE) ×3 IMPLANT
GOWN STRL REUS W/TWL LRG LVL3 (GOWN DISPOSABLE) ×4
GOWN STRL REUS W/TWL XL LVL3 (GOWN DISPOSABLE) ×6
IV NS 500ML (IV SOLUTION) ×2
IV NS 500ML BAXH (IV SOLUTION) ×1 IMPLANT
KIT TURNOVER KIT A (KITS) ×3 IMPLANT
LABEL OR SOLS (LABEL) ×3 IMPLANT
LOOP RED MAXI  1X406MM (MISCELLANEOUS) ×2
LOOP VESSEL MAXI 1X406 RED (MISCELLANEOUS) ×1 IMPLANT
LOOP VESSEL MINI 0.8X406 BLUE (MISCELLANEOUS) ×2 IMPLANT
LOOPS BLUE MINI 0.8X406MM (MISCELLANEOUS) ×4
NEEDLE FILTER BLUNT 18X 1/2SAF (NEEDLE) ×2
NEEDLE FILTER BLUNT 18X1 1/2 (NEEDLE) ×1 IMPLANT
NEEDLE HYPO 30X.5 LL (NEEDLE) IMPLANT
NS IRRIG 500ML POUR BTL (IV SOLUTION) ×3 IMPLANT
PACK EXTREMITY ARMC (MISCELLANEOUS) ×3 IMPLANT
PAD PREP 24X41 OB/GYN DISP (PERSONAL CARE ITEMS) ×3 IMPLANT
PUNCH SURGICAL ROTATE 2.7MM (MISCELLANEOUS) IMPLANT
STOCKINETTE STRL 4IN 9604848 (GAUZE/BANDAGES/DRESSINGS) ×3 IMPLANT
SUT MNCRL+ 5-0 UNDYED PC-3 (SUTURE) ×1 IMPLANT
SUT MONOCRYL 5-0 (SUTURE) ×2
SUT PROLENE 6 0 BV (SUTURE) ×12 IMPLANT
SUT SILK 2 0 (SUTURE) ×2
SUT SILK 2-0 18XBRD TIE 12 (SUTURE) ×1 IMPLANT
SUT SILK 3 0 (SUTURE) ×2
SUT SILK 3-0 18XBRD TIE 12 (SUTURE) ×1 IMPLANT
SUT SILK 4 0 (SUTURE) ×2
SUT SILK 4-0 18XBRD TIE 12 (SUTURE) ×1 IMPLANT
SUT VIC AB 3-0 SH 27 (SUTURE) ×2
SUT VIC AB 3-0 SH 27X BRD (SUTURE) ×1 IMPLANT
SYR 20CC LL (SYRINGE) ×3 IMPLANT
SYR 3ML LL SCALE MARK (SYRINGE) ×3 IMPLANT
TOWEL OR 17X26 4PK STRL BLUE (TOWEL DISPOSABLE) IMPLANT

## 2018-06-14 NOTE — Anesthesia Procedure Notes (Signed)
Procedure Name: LMA Insertion Performed by: Lance Muss, CRNA Pre-anesthesia Checklist: Patient identified, Patient being monitored, Timeout performed, Emergency Drugs available and Suction available Patient Re-evaluated:Patient Re-evaluated prior to induction Oxygen Delivery Method: Circle system utilized Preoxygenation: Pre-oxygenation with 100% oxygen Induction Type: IV induction LMA: LMA inserted LMA Size: 5.0 Tube type: Oral Number of attempts: 1 Placement Confirmation: positive ETCO2 and breath sounds checked- equal and bilateral Tube secured with: Tape Dental Injury: Teeth and Oropharynx as per pre-operative assessment

## 2018-06-14 NOTE — Anesthesia Post-op Follow-up Note (Signed)
Anesthesia QCDR form completed.        

## 2018-06-14 NOTE — Transfer of Care (Signed)
Immediate Anesthesia Transfer of Care Note  Patient: Cory Miller  Procedure(s) Performed: ARTERIOVENOUS (AV) FISTULA CREATION(brachiocephalic) (Right Arm Upper)  Patient Location: PACU  Anesthesia Type:General  Level of Consciousness: sedated and responds to stimulation  Airway & Oxygen Therapy: Patient Spontanous Breathing and Patient connected to face mask oxygen  Post-op Assessment: Report given to RN and Post -op Vital signs reviewed and stable  Post vital signs: Reviewed and stable  Last Vitals:  Vitals Value Taken Time  BP 155/61 06/14/2018 12:15 PM  Temp    Pulse 78 06/14/2018 12:15 PM  Resp 11 06/14/2018 12:15 PM  SpO2 100 % 06/14/2018 12:15 PM    Last Pain:  Vitals:   06/14/18 0844  TempSrc: Temporal  PainSc: 0-No pain         Complications: No apparent anesthesia complications

## 2018-06-14 NOTE — Op Note (Signed)
     OPERATIVE NOTE   PROCEDURE: right brachial cephalic arteriovenous fistula placement  PRE-OPERATIVE DIAGNOSIS: End Stage Renal Disease  POST-OPERATIVE DIAGNOSIS: End Stage Renal Disease  SURGEON: Hortencia Pilar  ASSISTANT(S): Ms. Hezzie Bump  ANESTHESIA: general  ESTIMATED BLOOD LOSS: <50 cc  FINDING(S): 4.0 mm cephalic vein  SPECIMEN(S):  none  INDICATIONS:   Cory Miller is a 77 y.o. male who presents with end stage renal disease.  The patient is scheduled for right brachiocephalic arteriovenous fistula placement.  The patient is aware the risks include but are not limited to: bleeding, infection, steal syndrome, nerve damage, ischemic monomelic neuropathy, failure to mature, and need for additional procedures.  The patient is aware of the risks of the procedure and elects to proceed forward.  DESCRIPTION: After full informed written consent was obtained from the patient, the patient was brought back to the operating room and placed supine upon the operating table.  Prior to induction, the patient received IV antibiotics.   After obtaining adequate anesthesia, the patient was then prepped and draped in the standard fashion for a right arm access procedure.    A curvilinear incision was then created midway between the radial impulse and the cephalic vein. The cephalic vein was then identified and dissected circumferentially. It was marked with a surgical marker.    Attention was then turned to the brachial artery which was exposed through the same incision and looped proximally and distally. Side branches were controlled with 4-0 silk ties.  The distal segment of the vein was ligated with a  2-0 silk, and the vein was transected.  The proximal segment was interrogated with serial dilators.  The vein accepted up to a 4.0 mm dilator without any difficulty. Heparinized saline was infused into the vein and clamped it with a small bulldog.  At this point, I reset my exposure  of the brachial artery and controlled the artery with vessel loops proximally and distally.  An arteriotomy was then made with a #11 blade, and extended with a Potts scissor.  Heparinized saline was injected proximal and distal into the radial artery.  The vein was then approximated to the artery while the artery was in its native bed and subsequently the vein was beveled using Potts scissors. The vein was then sewn to the artery in an end-to-side configuration with a running stitch of 6-0 Prolene.  Prior to completing this anastomosis Flushing maneuvers were performed and the artery was allowed to forward and back bleed.  There was no evidence of clot from any vessels.  I completed the anastomosis in the usual fashion and then released all vessel loops and clamps.    There was good  thrill in the venous outflow, and there was 1+ palpable radial pulse.  At this point, I irrigated out the surgical wound.  There was no further active bleeding.  The subcutaneous tissue was reapproximated with a running stitch of 3-0 Vicryl.  The skin was then reapproximated with a running subcuticular stitch of 4-0 Vicryl.  The skin was then cleaned, dried, and reinforced with Dermabond.    The patient tolerated this procedure well.   COMPLICATIONS: None  CONDITION: [facility]  Office: [phone]   06/14/2018, 11:53 AM

## 2018-06-14 NOTE — Anesthesia Preprocedure Evaluation (Signed)
Anesthesia Evaluation  Patient identified by MRN, date of birth, ID band Patient awake    Reviewed: Allergy & Precautions, NPO status , Patient's Chart, lab work & pertinent test results, reviewed documented beta blocker date and time   History of Anesthesia Complications Negative for: history of anesthetic complications  Airway Mallampati: III  TM Distance: >3 FB     Dental  (+) Chipped   Pulmonary shortness of breath and with exertion, sleep apnea and Continuous Positive Airway Pressure Ventilation , neg COPD, neg recent URI, former smoker,           Cardiovascular Exercise Tolerance: Poor hypertension, Pt. on medications and Pt. on home beta blockers (-) angina+ CAD, + Past MI, + CABG and + Peripheral Vascular Disease  (-) Cardiac Stents (-) dysrhythmias (-) Valvular Problems/Murmurs     Neuro/Psych neg Seizures CVA negative psych ROS   GI/Hepatic Neg liver ROS, PUD, GERD  ,  Endo/Other  diabetesMorbid obesity  Renal/GU CRFRenal disease     Musculoskeletal   Abdominal   Peds  Hematology  (+) Blood dyscrasia, ,   Anesthesia Other Findings Past Medical History: No date: Blood dyscrasia     Comment: prostate No date: CAD (coronary artery disease) of artery bypass* No date: Carotid atherosclerosis No date: Carotid stenosis No date: Chronic kidney disease No date: CVA (cerebrovascular accident) ([place]) No date: Diabetes mellitus without complication ([place]) No date: ED (erectile dysfunction) No date: Elevated troponin I level No date: Esophageal ulcer No date: GERD (gastroesophageal reflux disease) No date: Gout No date: Hard of hearing No date: Hyperlipidemia No date: Hypertension No date: MI (myocardial infarction) ([place]) No date: Neuropathy ([place]) No date: Occlusion of both internal carotid arteries No date: Prostate CA ([place]) No date: Sleep apnea   Reproductive/Obstetrics negative OB ROS                              Anesthesia Physical  Anesthesia Plan  ASA: IV  Anesthesia Plan: General   Post-op Pain Management:    Induction: Intravenous  PONV Risk Score and Plan: 1 and Ondansetron and Dexamethasone  Airway Management Planned: LMA  Additional Equipment:   Intra-op Plan:   Post-operative Plan: Extubation in OR  Informed Consent: I have reviewed the patients History and Physical, chart, labs and discussed the procedure including the risks, benefits and alternatives for the proposed anesthesia with the patient or authorized representative who has indicated his/her understanding and acceptance.     Plan Discussed with: CRNA  Anesthesia Plan Comments:         Anesthesia Quick Evaluation

## 2018-06-14 NOTE — Discharge Instructions (Signed)

## 2018-06-14 NOTE — H&P (Signed)
 [facility] History & Physical Update  The patient was interviewed and re-examined.  The patient's previous History and Physical has been reviewed and is unchanged.  There is no change in the plan of care. We plan to proceed with the scheduled procedure.  Hortencia Pilar, MD  06/14/2018, 10:03 AM

## 2018-06-15 NOTE — Anesthesia Postprocedure Evaluation (Signed)
Anesthesia Post Note  Patient: Levorn Oleski  Procedure(s) Performed: ARTERIOVENOUS (AV) FISTULA CREATION(brachiocephalic) (Right Arm Upper)  Patient location during evaluation: PACU Anesthesia Type: General Level of consciousness: awake and alert Pain management: pain level controlled Vital Signs Assessment: post-procedure vital signs reviewed and stable Respiratory status: spontaneous breathing, nonlabored ventilation, respiratory function stable and patient connected to nasal cannula oxygen Cardiovascular status: blood pressure returned to baseline and stable Postop Assessment: no apparent nausea or vomiting Anesthetic complications: no     Last Vitals:  Vitals:   06/14/18 1300 06/14/18 1321  BP: (!) 144/57 (!) 158/51  Pulse: 75 83  Resp: 17 18  Temp: 36.4 C 36.7 C  SpO2: 97% 98%    Last Pain:  Vitals:   06/14/18 1321  TempSrc: Oral  PainSc: 0-No pain                 Martha Clan

## 2018-06-20 ENCOUNTER — Telehealth (INDEPENDENT_AMBULATORY_CARE_PROVIDER_SITE_OTHER): Payer: Self-pay | Admitting: Vascular Surgery

## 2018-06-20 NOTE — Telephone Encounter (Signed)
Follow-Ups: Follow up with Schnier, Dolores Lory, MD (Vascular Surgery) in 2 weeks (06/28/2018); no studies

## 2018-07-08 ENCOUNTER — Encounter (INDEPENDENT_AMBULATORY_CARE_PROVIDER_SITE_OTHER): Payer: Self-pay | Admitting: Vascular Surgery

## 2018-07-08 ENCOUNTER — Other Ambulatory Visit: Payer: Self-pay

## 2018-07-08 ENCOUNTER — Ambulatory Visit (INDEPENDENT_AMBULATORY_CARE_PROVIDER_SITE_OTHER): Payer: Medicare PPO | Admitting: Vascular Surgery

## 2018-07-08 VITALS — BP 113/55 | HR 72 | Ht 73.0 in | Wt 284.0 lb

## 2018-07-08 DIAGNOSIS — N184 Chronic kidney disease, stage 4 (severe): Secondary | ICD-10-CM

## 2018-07-08 NOTE — Progress Notes (Signed)
Patient ID: Cory Miller, male   DOB: 1941-06-06, 77 y.o.   MRN: [id number]  Chief Complaint  Patient presents with  . Follow-up    Dialyisis fistula access placement    HPI Cory Miller is a 77 y.o. male.     S/P creation of a right brachial cephalic arteriovenous fistula placement on 06/14/2018  He denies pain at the surgery site no fever or chills   Past Medical History:  Diagnosis Date  . Blood dyscrasia    prostate  . CAD (coronary artery disease) of artery bypass graft   . Carotid atherosclerosis   . Carotid stenosis   . Chronic kidney disease   . CKD (chronic kidney disease), stage V ([place])   . CVA (cerebrovascular accident) ([place])   . Diabetes mellitus without complication ([place])   . Dyspnea   . ED (erectile dysfunction)   . Elevated troponin I level   . Esophageal ulcer   . GERD (gastroesophageal reflux disease)   . Gout   . Hard of hearing    Pt very hard  of hearing  . Hyperlipidemia   . Hypertension   . MI (myocardial infarction) ([place])   . Neuropathy   . Occlusion of both internal carotid arteries   . Prostate CA ([place])   . Sleep apnea     Past Surgical History:  Procedure Laterality Date  . ARTERIAL BYPASS SURGRY    . AV FISTULA PLACEMENT Right 06/14/2018   Procedure: ARTERIOVENOUS (AV) FISTULA CREATION(brachiocephalic);  Surgeon: Katha Cabal, MD;  Location: [facility] ORS;  Service: Vascular;  Laterality: Right;  . CAROTID ENDARTERECTOMY Left   . chest tubes x 2     for pneumothorax  . COLONOSCOPY WITH PROPOFOL N/A 06/02/2016   Procedure: COLONOSCOPY WITH PROPOFOL;  Surgeon: Lollie Sails, MD;  Location: [facility] ENDOSCOPY;  Service: Endoscopy;  Laterality: N/A;  . COLONOSCOPY WITH PROPOFOL N/A 07/28/2016   Procedure: COLONOSCOPY WITH PROPOFOL;  Surgeon: Lollie Sails, MD;  Location: [facility] ENDOSCOPY;  Service: Endoscopy;  Laterality: N/A;  . CORONARY ANGIOPLASTY    . CORONARY ARTERY BYPASS GRAFT    . decortication,parietal pleurectomy Left  05/2002  . ESOPHAGOGASTRODUODENOSCOPY N/A 08/05/2015   Procedure: ESOPHAGOGASTRODUODENOSCOPY (EGD);  Surgeon: Hulen Luster, MD;  Location: [facility] ENDOSCOPY;  Service: Endoscopy;  Laterality: N/A;  . ESOPHAGOGASTRODUODENOSCOPY (EGD) WITH PROPOFOL N/A 09/30/2015   Procedure: ESOPHAGOGASTRODUODENOSCOPY (EGD) WITH PROPOFOL;  Surgeon: Hulen Luster, MD;  Location: [facility] ENDOSCOPY;  Service: Gastroenterology;  Laterality: N/A;  . ostate cryoablation    . partial thoracoscopy  6/03      No Known Allergies  Current Outpatient Medications  Medication Sig Dispense Refill  . allopurinol (ZYLOPRIM) 100 MG tablet Take 100 mg by mouth daily.    [name] amLODipine (NORVASC) 10 MG tablet Take 10 mg by mouth daily.    [name] aspirin 81 MG tablet Take 81 mg by mouth daily.    [name] atorvastatin (LIPITOR) 40 MG tablet Take 40 mg by mouth at bedtime.    . carvedilol (COREG) 12.5 MG tablet Take 12.5 mg by mouth 2 (two) times daily with a meal.     . cholecalciferol (VITAMIN D) 1000 units tablet Take 1,000 Units by mouth daily.     . cloNIDine (CATAPRES) 0.3 MG tablet Take 0.3 mg by mouth 3 (three) times daily.     . clopidogrel (PLAVIX) 75 MG tablet Take 75 mg by mouth daily.    [name] Cod Liver Oil CAPS Take 1 capsule by  mouth daily.     . colchicine 0.6 MG tablet Take 0.6 mg by mouth every other day.     . docusate sodium (COLACE) 100 MG capsule Take 100 mg by mouth 2 (two) times daily.    . dorzolamide-timolol (COSOPT) 22.3-6.8 MG/ML ophthalmic solution Place 1 drop into both eyes 2 (two) times daily.     . Flaxseed, Linseed, (FLAX SEED OIL) 1000 MG CAPS Take 1,300 mg by mouth daily.     . fluticasone (FLONASE) 50 MCG/ACT nasal spray Place 1 spray into both nostrils daily as needed for allergies.     . furosemide (LASIX) 40 MG tablet Take 80 mg by mouth 2 (two) times daily.     . hydrALAZINE (APRESOLINE) 100 MG tablet Take 100 mg by mouth 3 (three) times daily.     [name] HYDROcodone-acetaminophen (NORCO) 5-325 MG tablet Take 1-2 tablets by  mouth every 6 (six) hours as needed for moderate pain or severe pain. 40 tablet 0  . hydrOXYzine (ATARAX/VISTARIL) 25 MG tablet Take 25 mg by mouth every 6 (six) hours as needed for itching.     . insulin aspart (NOVOLOG) 100 UNIT/ML FlexPen Inject 10 Units into the skin 3 (three) times daily with meals.     . insulin detemir (LEVEMIR) 100 UNIT/ML injection Inject 35 Units into the skin 2 (two) times daily.     . isosorbide mononitrate (IMDUR) 120 MG 24 hr tablet Take 120 mg by mouth daily.    [name] ketoconazole (NIZORAL) 2 % shampoo Apply 1 application topically 2 (two) times a week.     . loratadine (CLARITIN) 10 MG tablet Take 10 mg by mouth daily.    [name] oxyCODONE-acetaminophen (PERCOCET/ROXICET) 5-325 MG per tablet Take 1 tablet by mouth 3 (three) times daily.    . pantoprazole (PROTONIX) 40 MG tablet Take 1 tablet (40 mg total) by mouth daily. 40 mg BID for 1 month followed by once daily (Patient taking differently: Take 40 mg by mouth 2 (two) times daily. ) 60 tablet 0  . SPS 15 GM/60ML suspension Take 60 mLs by mouth once a week.  3  . triamcinolone cream (KENALOG) 0.1 % Apply 1 application topically 2 (two) times daily as needed (ITCHING).      No current facility-administered medications for this visit.         Physical Exam BP (!) 113/55 (BP Location: Left Arm)   Pulse 72   Ht 6\' 1"  (1.854 m)   Wt 284 lb (128.8 kg)   BMI 37.47 kg/m  Gen:  WD/WN, NAD Skin: incision C/D/I, good thrill and bruit     Assessment/Plan: 1. CKD (chronic kidney disease), stage IV (HCC) Recommend:  The patient is doing well and currently has a patent dialysis access.  The patient's dialysis center is not reporting any access issues.  He still has significant edema and I am concerned there is a stricture at the bend in the vein.  The patient should have a duplex ultrasound of the dialysis access in 2 weeks.  The patient will follow-up with me in the office after each ultrasound          Cory Miller 07/08/2018, 2:57 PM   This note was created with Dragon medical transcription system.  Any errors from dictation are unintentional.

## 2018-07-09 ENCOUNTER — Encounter (INDEPENDENT_AMBULATORY_CARE_PROVIDER_SITE_OTHER): Payer: Self-pay | Admitting: Vascular Surgery

## 2018-08-08 ENCOUNTER — Ambulatory Visit (INDEPENDENT_AMBULATORY_CARE_PROVIDER_SITE_OTHER): Payer: Medicare PPO | Admitting: Vascular Surgery

## 2018-08-08 ENCOUNTER — Encounter (INDEPENDENT_AMBULATORY_CARE_PROVIDER_SITE_OTHER): Payer: Self-pay | Admitting: Vascular Surgery

## 2018-08-08 ENCOUNTER — Ambulatory Visit (INDEPENDENT_AMBULATORY_CARE_PROVIDER_SITE_OTHER): Payer: Medicare PPO

## 2018-08-08 VITALS — BP 119/54 | HR 72 | Resp 16 | Ht 73.0 in | Wt 270.0 lb

## 2018-08-08 DIAGNOSIS — I25118 Atherosclerotic heart disease of native coronary artery with other forms of angina pectoris: Secondary | ICD-10-CM

## 2018-08-08 DIAGNOSIS — N184 Chronic kidney disease, stage 4 (severe): Secondary | ICD-10-CM

## 2018-08-08 DIAGNOSIS — I159 Secondary hypertension, unspecified: Secondary | ICD-10-CM

## 2018-08-08 DIAGNOSIS — E118 Type 2 diabetes mellitus with unspecified complications: Secondary | ICD-10-CM

## 2018-08-10 ENCOUNTER — Encounter (INDEPENDENT_AMBULATORY_CARE_PROVIDER_SITE_OTHER): Payer: Self-pay | Admitting: Vascular Surgery

## 2018-08-10 NOTE — Progress Notes (Signed)
MRN : [id number]  Cory Miller is a 77 y.o. (1941-08-06) male who presents with chief complaint of  Chief Complaint  Patient presents with  . Follow-up    HDA ultrasound follow up  .  History of Present Illness:  The patient returns to the office for followup of their dialysis access. The function of the access has been stable but he is not yet using it.  The patient denies hand pain or other symptoms consistent with steal phenomena.  No significant arm swelling.  The patient denies redness or swelling at the access site. The patient denies fever or chills.  The patient denies amaurosis fugax or recent TIA symptoms. There are no recent neurological changes noted. The patient denies claudication symptoms or rest pain symptoms. The patient denies history of DVT, PE or superficial thrombophlebitis. The patient denies recent episodes of angina or shortness of breath.   Duplex ultrasound of the AV access shows a patent access.  The previously noted stenosis is worse compared to last study.      Current Meds  Medication Sig  . allopurinol (ZYLOPRIM) 100 MG tablet Take 100 mg by mouth daily.  [name] amLODipine (NORVASC) 10 MG tablet Take 10 mg by mouth daily.  [name] aspirin 81 MG tablet Take 81 mg by mouth daily.  [name] atorvastatin (LIPITOR) 40 MG tablet Take 40 mg by mouth at bedtime.  . carvedilol (COREG) 12.5 MG tablet Take 12.5 mg by mouth 2 (two) times daily with a meal.   . cholecalciferol (VITAMIN D) 1000 units tablet Take 1,000 Units by mouth daily.   . cloNIDine (CATAPRES) 0.3 MG tablet Take 0.3 mg by mouth 3 (three) times daily.   . clopidogrel (PLAVIX) 75 MG tablet Take 75 mg by mouth daily.  [name] Cod Liver Oil CAPS Take 1 capsule by mouth daily.   . colchicine 0.6 MG tablet Take 0.6 mg by mouth every other day.   . docusate sodium (COLACE) 100 MG capsule Take 100 mg by mouth 2 (two) times daily.  . dorzolamide-timolol (COSOPT) 22.3-6.8 MG/ML ophthalmic solution Place 1 drop into  both eyes 2 (two) times daily.   . Flaxseed, Linseed, (FLAX SEED OIL) 1000 MG CAPS Take 1,300 mg by mouth daily.   . fluticasone (FLONASE) 50 MCG/ACT nasal spray Place 1 spray into both nostrils daily as needed for allergies.   . furosemide (LASIX) 40 MG tablet Take 80 mg by mouth 2 (two) times daily.   . hydrALAZINE (APRESOLINE) 100 MG tablet Take 100 mg by mouth 3 (three) times daily.   [name] HYDROcodone-acetaminophen (NORCO) 5-325 MG tablet Take 1-2 tablets by mouth every 6 (six) hours as needed for moderate pain or severe pain.  . hydrOXYzine (ATARAX/VISTARIL) 25 MG tablet Take 25 mg by mouth every 6 (six) hours as needed for itching.   . insulin aspart (NOVOLOG) 100 UNIT/ML FlexPen Inject 10 Units into the skin 3 (three) times daily with meals.   . insulin detemir (LEVEMIR) 100 UNIT/ML injection Inject 35 Units into the skin 2 (two) times daily.   . isosorbide mononitrate (IMDUR) 120 MG 24 hr tablet Take 120 mg by mouth daily.  [name] ketoconazole (NIZORAL) 2 % shampoo Apply 1 application topically 2 (two) times a week.   . loratadine (CLARITIN) 10 MG tablet Take 10 mg by mouth daily.  [name] oxyCODONE-acetaminophen (PERCOCET/ROXICET) 5-325 MG per tablet Take 1 tablet by mouth 3 (three) times daily.  . pantoprazole (PROTONIX) 40 MG tablet Take 1 tablet (40 mg  total) by mouth daily. 40 mg BID for 1 month followed by once daily (Patient taking differently: Take 40 mg by mouth 2 (two) times daily. )  . SPS 15 GM/60ML suspension Take 60 mLs by mouth once a week.  . triamcinolone cream (KENALOG) 0.1 % Apply 1 application topically 2 (two) times daily as needed (ITCHING).     Past Medical History:  Diagnosis Date  . Blood dyscrasia    prostate  . CAD (coronary artery disease) of artery bypass graft   . Carotid atherosclerosis   . Carotid stenosis   . Chronic kidney disease   . CKD (chronic kidney disease), stage V ([place])   . CVA (cerebrovascular accident) ([place])   . Diabetes mellitus without complication  ([place])   . Dyspnea   . ED (erectile dysfunction)   . Elevated troponin I level   . Esophageal ulcer   . GERD (gastroesophageal reflux disease)   . Gout   . Hard of hearing    Pt very hard  of hearing  . Hyperlipidemia   . Hypertension   . MI (myocardial infarction) ([place])   . Neuropathy   . Occlusion of both internal carotid arteries   . Prostate CA ([place])   . Sleep apnea     Past Surgical History:  Procedure Laterality Date  . ARTERIAL BYPASS SURGRY    . AV FISTULA PLACEMENT Right 06/14/2018   Procedure: ARTERIOVENOUS (AV) FISTULA CREATION(brachiocephalic);  Surgeon: Katha Cabal, MD;  Location: [facility] ORS;  Service: Vascular;  Laterality: Right;  . CAROTID ENDARTERECTOMY Left   . chest tubes x 2     for pneumothorax  . COLONOSCOPY WITH PROPOFOL N/A 06/02/2016   Procedure: COLONOSCOPY WITH PROPOFOL;  Surgeon: Lollie Sails, MD;  Location[facility] ENDOSCOPY;  Service: Endoscopy;  Laterality: N/A;  . COLONOSCOPY WITH PROPOFOL N/A 07/28/2016   Procedure: COLONOSCOPY WITH PROPOFOL;  Surgeon: Lollie Sails, MD;  Location: [facility] ENDOSCOPY;  Service: Endoscopy;  Laterality: N/A;  . CORONARY ANGIOPLASTY    . CORONARY ARTERY BYPASS GRAFT    . decortication,parietal pleurectomy Left 05/2002  . ESOPHAGOGASTRODUODENOSCOPY N/A 08/05/2015   Procedure: ESOPHAGOGASTRODUODENOSCOPY (EGD);  Surgeon: Hulen Luster, MD;  Location: [facility] ENDOSCOPY;  Service: Endoscopy;  Laterality: N/A;  . ESOPHAGOGASTRODUODENOSCOPY (EGD) WITH PROPOFOL N/A 09/30/2015   Procedure: ESOPHAGOGASTRODUODENOSCOPY (EGD) WITH PROPOFOL;  Surgeon: Hulen Luster, MD;  Location: [facility] ENDOSCOPY;  Service: Gastroenterology;  Laterality: N/A;  . ostate cryoablation    . partial thoracoscopy  6/03    Social History Social History   Tobacco Use  . Smoking status: Former Research scientist (life sciences)  . Smokeless tobacco: Never Used  Substance Use Topics  . Alcohol use: Yes    Alcohol/week: 0.0 standard drinks  . Drug use: No    Family History Family  History  Problem Relation Age of Onset  . CAD Unknown   . Diabetes Unknown   . Cancer Unknown   . Stroke Unknown     No Known Allergies   REVIEW OF SYSTEMS (Negative unless checked)  Constitutional: [] Weight loss  [] Fever  [] Chills Cardiac: [] Chest pain   [] Chest pressure   [] Palpitations   [] Shortness of breath when laying flat   [x] Shortness of breath with exertion. Vascular:  [] Pain in legs with walking   [] Pain in legs at rest  [] History of DVT   [] Phlebitis   [] Swelling in legs   [] Varicose veins   [] Non-healing ulcers Pulmonary:   [] Uses home oxygen   [] Productive cough   [] Hemoptysis   []   Wheeze  [] COPD   [] Asthma Neurologic:  [] Dizziness   [] Seizures   [] History of stroke   [] History of TIA  [] Aphasia   [] Vissual changes   [] Weakness or numbness in arm   [] Weakness or numbness in leg Musculoskeletal:   [] Joint swelling   [] Joint pain   [] Low back pain Hematologic:  [] Easy bruising  [] Easy bleeding   [] Hypercoagulable state   [] Anemic Gastrointestinal:  [] Diarrhea   [] Vomiting  [] Gastroesophageal reflux/heartburn   [] Difficulty swallowing. Genitourinary:  [x] Chronic kidney disease   [] Difficult urination  [] Frequent urination   [] Blood in urine Skin:  [] Rashes   [] Ulcers  Psychological:  [] History of anxiety   []  History of major depression.  Physical Examination  Vitals:   08/08/18 1440  BP: (!) 119/54  Pulse: 72  Resp: 16  Weight: 270 lb (122.5 kg)  Height: 6\' 1"  (1.854 m)   Body mass index is 35.62 kg/m. Gen: WD/WN, NAD Head: [place]/AT, No temporalis wasting.  Ear/Nose/Throat: Hearing grossly intact, nares w/o erythema or drainage Eyes: PER, EOMI, sclera nonicteric.  Neck: Supple, no large masses.   Pulmonary:  Good air movement, no audible wheezing bilaterally, no use of accessory muscles.  Cardiac: RRR, no JVD Vascular:  Left brachial cephalic fistula moderately pulsatile, + bruit Vessel Right Left  Radial Palpable Palpable  Ulnar Palpable Palpable  Brachial  Palpable Palpable  Gastrointestinal: Non-distended. No guarding/no peritoneal signs.  Musculoskeletal: M/S 5/5 throughout.  No deformity or atrophy.  Neurologic: CN 2-12 intact. Symmetrical.  Speech is fluent. Motor exam as listed above. Psychiatric: Judgment intact, Mood & affect appropriate for pt's clinical situation. Dermatologic: No rashes or ulcers noted.  No changes consistent with cellulitis. Lymph : No lichenification or skin changes of chronic lymphedema.  CBC Lab Results  Component Value Date   WBC 7.1 06/07/2018   HGB 10.2 (L) 06/14/2018   HCT 30.0 (L) 06/14/2018   MCV 91.8 06/07/2018   PLT 249 06/07/2018    BMET    Component Value Date/Time   NA 140 06/14/2018 0923   K 4.2 06/14/2018 0923   CL 107 06/07/2018 1544   CO2 20 (L) 06/07/2018 1544   GLUCOSE 230 (H) 06/14/2018 0923   BUN 73 (H) 06/07/2018 1544   CREATININE 4.19 (H) 06/07/2018 1544   CALCIUM 8.3 (L) 06/07/2018 1544   GFRNONAA 13 (L) 06/07/2018 1544   GFRAA 15 (L) 06/07/2018 1544   CrCl cannot be calculated (Patient's most recent lab result is older than the maximum 21 days allowed.).  COAG Lab Results  Component Value Date   INR 0.96 06/07/2018   INR 0.92 07/25/2016   INR 1.02 08/02/2015    Radiology No results found.  Assessment/Plan 1. CKD (chronic kidney disease), stage IV ([place]) Recommend:  The patient is doing well and currently has adequate dialysis access.  The patient is not yet on dialysis but the stricture of his mid fistula has gotten worse.  Flow pattern is not stable when compared to the prior ultrasound.  Because he is not yet on dialysis I do not want to angiogram him yet but I do feel it is appropriate to follow his acces more closely and if worse in three months we will plan to do an angiogram  The patient should have a duplex ultrasound of the dialysis access in 3 months.  The patient will follow-up with me in the office after each ultrasound   - VAS [place] (AVF, AVG); Future  2. Secondary hypertension Continue  antihypertensive medications as already ordered, these medications have been reviewed and there are no changes at this time.   3. Coronary artery disease of native artery of native heart with stable angina pectoris (HCC) Continue cardiac and antihypertensive medications as already ordered and reviewed, no changes at this time.  Continue statin as ordered and reviewed, no changes at this time  Nitrates PRN for chest pain   4. Type 2 diabetes mellitus with complication, unspecified whether long term insulin use (HCC) Continue hypoglycemic medications as already ordered, these medications have been reviewed and there are no changes at this time.  Hgb A1C to be monitored as already arranged by primary service     Hortencia Pilar, MD  08/10/2018 12:19 PM

## 2018-08-22 ENCOUNTER — Emergency Department: Payer: Medicare PPO

## 2018-08-22 ENCOUNTER — Inpatient Hospital Stay
Admission: EM | Admit: 2018-08-22 | Discharge: 2018-08-27 | DRG: 291 | Disposition: A | Discharge status: Home Health | Payer: Medicare PPO | Attending: Internal Medicine | Admitting: Internal Medicine

## 2018-08-22 ENCOUNTER — Encounter: Payer: Self-pay | Admitting: Emergency Medicine

## 2018-08-22 ENCOUNTER — Other Ambulatory Visit: Payer: Self-pay

## 2018-08-22 DIAGNOSIS — Z951 Presence of aortocoronary bypass graft: Secondary | ICD-10-CM

## 2018-08-22 DIAGNOSIS — J9601 Acute respiratory failure with hypoxia: Secondary | ICD-10-CM | POA: Diagnosis not present

## 2018-08-22 DIAGNOSIS — D631 Anemia in chronic kidney disease: Secondary | ICD-10-CM | POA: Diagnosis present

## 2018-08-22 DIAGNOSIS — E1122 Type 2 diabetes mellitus with diabetic chronic kidney disease: Secondary | ICD-10-CM | POA: Diagnosis present

## 2018-08-22 DIAGNOSIS — J810 Acute pulmonary edema: Secondary | ICD-10-CM | POA: Diagnosis not present

## 2018-08-22 DIAGNOSIS — Z79899 Other long term (current) drug therapy: Secondary | ICD-10-CM

## 2018-08-22 DIAGNOSIS — N2581 Secondary hyperparathyroidism of renal origin: Secondary | ICD-10-CM | POA: Diagnosis present

## 2018-08-22 DIAGNOSIS — Z794 Long term (current) use of insulin: Secondary | ICD-10-CM

## 2018-08-22 DIAGNOSIS — L8990 Pressure ulcer of unspecified site, unspecified stage: Secondary | ICD-10-CM | POA: Diagnosis not present

## 2018-08-22 DIAGNOSIS — Z23 Encounter for immunization: Secondary | ICD-10-CM | POA: Diagnosis present

## 2018-08-22 DIAGNOSIS — I6523 Occlusion and stenosis of bilateral carotid arteries: Secondary | ICD-10-CM | POA: Diagnosis present

## 2018-08-22 DIAGNOSIS — Z8501 Personal history of malignant neoplasm of esophagus: Secondary | ICD-10-CM

## 2018-08-22 DIAGNOSIS — M109 Gout, unspecified: Secondary | ICD-10-CM | POA: Diagnosis present

## 2018-08-22 DIAGNOSIS — I5032 Chronic diastolic (congestive) heart failure: Secondary | ICD-10-CM | POA: Diagnosis present

## 2018-08-22 DIAGNOSIS — R0602 Shortness of breath: Secondary | ICD-10-CM

## 2018-08-22 DIAGNOSIS — H9190 Unspecified hearing loss, unspecified ear: Secondary | ICD-10-CM | POA: Diagnosis present

## 2018-08-22 DIAGNOSIS — I252 Old myocardial infarction: Secondary | ICD-10-CM | POA: Diagnosis not present

## 2018-08-22 DIAGNOSIS — E1140 Type 2 diabetes mellitus with diabetic neuropathy, unspecified: Secondary | ICD-10-CM | POA: Diagnosis present

## 2018-08-22 DIAGNOSIS — J9600 Acute respiratory failure, unspecified whether with hypoxia or hypercapnia: Secondary | ICD-10-CM

## 2018-08-22 DIAGNOSIS — Z8546 Personal history of malignant neoplasm of prostate: Secondary | ICD-10-CM | POA: Diagnosis not present

## 2018-08-22 DIAGNOSIS — J811 Chronic pulmonary edema: Secondary | ICD-10-CM

## 2018-08-22 DIAGNOSIS — N179 Acute kidney failure, unspecified: Secondary | ICD-10-CM | POA: Diagnosis not present

## 2018-08-22 DIAGNOSIS — I2510 Atherosclerotic heart disease of native coronary artery without angina pectoris: Secondary | ICD-10-CM | POA: Diagnosis present

## 2018-08-22 DIAGNOSIS — N185 Chronic kidney disease, stage 5: Secondary | ICD-10-CM | POA: Diagnosis present

## 2018-08-22 DIAGNOSIS — Z7902 Long term (current) use of antithrombotics/antiplatelets: Secondary | ICD-10-CM

## 2018-08-22 DIAGNOSIS — G4733 Obstructive sleep apnea (adult) (pediatric): Secondary | ICD-10-CM | POA: Diagnosis present

## 2018-08-22 DIAGNOSIS — Z87891 Personal history of nicotine dependence: Secondary | ICD-10-CM

## 2018-08-22 DIAGNOSIS — Z8673 Personal history of transient ischemic attack (TIA), and cerebral infarction without residual deficits: Secondary | ICD-10-CM

## 2018-08-22 DIAGNOSIS — K219 Gastro-esophageal reflux disease without esophagitis: Secondary | ICD-10-CM | POA: Diagnosis present

## 2018-08-22 DIAGNOSIS — K2210 Ulcer of esophagus without bleeding: Secondary | ICD-10-CM | POA: Diagnosis present

## 2018-08-22 DIAGNOSIS — I132 Hypertensive heart and chronic kidney disease with heart failure and with stage 5 chronic kidney disease, or end stage renal disease: Principal | ICD-10-CM | POA: Diagnosis present

## 2018-08-22 DIAGNOSIS — E785 Hyperlipidemia, unspecified: Secondary | ICD-10-CM | POA: Diagnosis present

## 2018-08-22 DIAGNOSIS — Z7982 Long term (current) use of aspirin: Secondary | ICD-10-CM

## 2018-08-22 DIAGNOSIS — J9690 Respiratory failure, unspecified, unspecified whether with hypoxia or hypercapnia: Secondary | ICD-10-CM | POA: Diagnosis present

## 2018-08-22 DIAGNOSIS — N186 End stage renal disease: Secondary | ICD-10-CM

## 2018-08-22 LAB — BLOOD GAS, VENOUS
ACID-BASE DEFICIT: 6.9 mmol/L — AB (ref 0.0–2.0)
BICARBONATE: 18.5 mmol/L — AB (ref 20.0–28.0)
O2 SAT: 80.6 %
PO2 VEN: 49 mmHg — AB (ref 32.0–45.0)
Patient temperature: 37
pCO2, Ven: 36 mmHg — ABNORMAL LOW (ref 44.0–60.0)
pH, Ven: 7.32 (ref 7.250–7.430)

## 2018-08-22 LAB — CBC WITH DIFFERENTIAL/PLATELET
BASOS PCT: 1 %
Basophils Absolute: 0.1 10*3/uL (ref 0–0.1)
EOS ABS: 0.1 10*3/uL (ref 0–0.7)
EOS PCT: 1 %
HCT: 27.4 % — ABNORMAL LOW (ref 40.0–52.0)
HEMOGLOBIN: 9.1 g/dL — AB (ref 13.0–18.0)
Lymphocytes Relative: 7 %
Lymphs Abs: 0.5 10*3/uL — ABNORMAL LOW (ref 1.0–3.6)
MCH: 31.2 pg (ref 26.0–34.0)
MCHC: 33.3 g/dL (ref 32.0–36.0)
MCV: 93.7 fL (ref 80.0–100.0)
MONOS PCT: 9 %
Monocytes Absolute: 0.6 10*3/uL (ref 0.2–1.0)
NEUTROS PCT: 82 %
Neutro Abs: 5.2 10*3/uL (ref 1.4–6.5)
PLATELETS: 242 10*3/uL (ref 150–440)
RBC: 2.93 MIL/uL — ABNORMAL LOW (ref 4.40–5.90)
RDW: 16.4 % — AB (ref 11.5–14.5)
WBC: 6.3 10*3/uL (ref 3.8–10.6)

## 2018-08-22 LAB — BASIC METABOLIC PANEL
Anion gap: 11 (ref 5–15)
BUN: 106 mg/dL — AB (ref 8–23)
CALCIUM: 8.7 mg/dL — AB (ref 8.9–10.3)
CHLORIDE: 109 mmol/L (ref 98–111)
CO2: 19 mmol/L — ABNORMAL LOW (ref 22–32)
CREATININE: 5.45 mg/dL — AB (ref 0.61–1.24)
GFR, EST AFRICAN AMERICAN: 11 mL/min — AB (ref >60)
GFR, EST NON AFRICAN AMERICAN: 9 mL/min — AB (ref >60)
Glucose, Bld: 150 mg/dL — ABNORMAL HIGH (ref 70–99)
Potassium: 3.9 mmol/L (ref 3.5–5.1)
SODIUM: 139 mmol/L (ref 135–145)

## 2018-08-22 LAB — GLUCOSE, CAPILLARY
GLUCOSE-CAPILLARY: 133 mg/dL — AB (ref 70–99)
Glucose-Capillary: 144 mg/dL — ABNORMAL HIGH (ref 70–99)
Glucose-Capillary: 116 mg/dL — ABNORMAL HIGH (ref 70–99)

## 2018-08-22 LAB — BRAIN NATRIURETIC PEPTIDE: B NATRIURETIC PEPTIDE 5: 903 pg/mL — AB (ref 0.0–100.0)

## 2018-08-22 LAB — MAGNESIUM: MAGNESIUM: 1.7 mg/dL (ref 1.7–2.4)

## 2018-08-22 LAB — MRSA PCR SCREENING: MRSA BY PCR: NEGATIVE

## 2018-08-22 LAB — TROPONIN I

## 2018-08-22 MED ORDER — ACETAMINOPHEN 650 MG RE SUPP: 650.0000 mg | Freq: Four times a day (QID) | RECTAL | Status: DC | PRN

## 2018-08-22 MED ORDER — HYDRALAZINE HCL 50 MG PO TABS
100.0000 mg | ORAL_TABLET | Freq: Three times a day (TID) | ORAL | Status: DC
  Administered 2018-08-22 – 2018-08-27 (×14): 100 mg via ORAL
  Filled 2018-08-22 (×23): qty 2

## 2018-08-22 MED ORDER — SODIUM CHLORIDE 0.9% FLUSH
3.0000 mL | Freq: Two times a day (BID) | INTRAVENOUS | Status: DC
  Administered 2018-08-22 – 2018-08-27 (×10): 3 mL via INTRAVENOUS

## 2018-08-22 MED ORDER — COD LIVER OIL PO CAPS: 1.0000 | ORAL_CAPSULE | Freq: Every day | ORAL | Status: DC

## 2018-08-22 MED ORDER — MORPHINE SULFATE (PF) 2 MG/ML IV SOLN
INTRAVENOUS | Status: AC
  Administered 2018-08-22: 2 mg via INTRAVENOUS
  Filled 2018-08-22: qty 1

## 2018-08-22 MED ORDER — ASPIRIN 81 MG PO TABS: 81.0000 mg | ORAL_TABLET | Freq: Every day | ORAL | Status: DC

## 2018-08-22 MED ORDER — INSULIN ASPART 100 UNIT/ML ~~LOC~~ SOLN
0.0000 [IU] | Freq: Every day | SUBCUTANEOUS | Status: DC
  Administered 2018-08-23: 2 [IU] via SUBCUTANEOUS
  Administered 2018-08-24: 3 [IU] via SUBCUTANEOUS
  Filled 2018-08-22 (×2): qty 1

## 2018-08-22 MED ORDER — ALLOPURINOL 100 MG PO TABS
100.0000 mg | ORAL_TABLET | Freq: Every day | ORAL | Status: DC
  Administered 2018-08-23 – 2018-08-27 (×5): 100 mg via ORAL
  Filled 2018-08-22 (×6): qty 1

## 2018-08-22 MED ORDER — CLOPIDOGREL BISULFATE 75 MG PO TABS
75.0000 mg | ORAL_TABLET | Freq: Every day | ORAL | Status: DC
  Administered 2018-08-23 – 2018-08-27 (×5): 75 mg via ORAL
  Filled 2018-08-22 (×5): qty 1

## 2018-08-22 MED ORDER — ATORVASTATIN CALCIUM 20 MG PO TABS
40.0000 mg | ORAL_TABLET | Freq: Every day | ORAL | Status: DC
  Administered 2018-08-23 – 2018-08-26 (×4): 40 mg via ORAL
  Filled 2018-08-22 (×5): qty 2

## 2018-08-22 MED ORDER — ACETAMINOPHEN 325 MG PO TABS
650.0000 mg | ORAL_TABLET | Freq: Four times a day (QID) | ORAL | Status: DC | PRN
  Administered 2018-08-27: 650 mg via ORAL
  Filled 2018-08-22: qty 2

## 2018-08-22 MED ORDER — HYDROXYZINE HCL 25 MG PO TABS
25.0000 mg | ORAL_TABLET | Freq: Four times a day (QID) | ORAL | Status: DC | PRN
  Filled 2018-08-22: qty 1

## 2018-08-22 MED ORDER — FUROSEMIDE 10 MG/ML IJ SOLN
6.0000 mg/h | INTRAVENOUS | Status: DC
  Administered 2018-08-22: 6 mg/h via INTRAVENOUS
  Filled 2018-08-22: qty 25

## 2018-08-22 MED ORDER — ONDANSETRON HCL 4 MG/2ML IJ SOLN: 4.0000 mg | Freq: Four times a day (QID) | INTRAMUSCULAR | Status: DC | PRN

## 2018-08-22 MED ORDER — LORATADINE 10 MG PO TABS
10.0000 mg | ORAL_TABLET | Freq: Every day | ORAL | Status: DC
  Administered 2018-08-23: 10 mg via ORAL
  Filled 2018-08-22: qty 1

## 2018-08-22 MED ORDER — INSULIN ASPART 100 UNIT/ML ~~LOC~~ SOLN
0.0000 [IU] | Freq: Three times a day (TID) | SUBCUTANEOUS | Status: DC
  Administered 2018-08-22: 2 [IU] via SUBCUTANEOUS
  Administered 2018-08-23: 3 [IU] via SUBCUTANEOUS
  Administered 2018-08-23: 2 [IU] via SUBCUTANEOUS
  Administered 2018-08-23: 5 [IU] via SUBCUTANEOUS
  Administered 2018-08-24 (×2): 3 [IU] via SUBCUTANEOUS
  Administered 2018-08-24 – 2018-08-25 (×2): 5 [IU] via SUBCUTANEOUS
  Administered 2018-08-25: 3 [IU] via SUBCUTANEOUS
  Administered 2018-08-25: 8 [IU] via SUBCUTANEOUS
  Administered 2018-08-26: 3 [IU] via SUBCUTANEOUS
  Administered 2018-08-26: 5 [IU] via SUBCUTANEOUS
  Administered 2018-08-26: 8 [IU] via SUBCUTANEOUS
  Administered 2018-08-27: 3 [IU] via SUBCUTANEOUS
  Administered 2018-08-27: 8 [IU] via SUBCUTANEOUS
  Filled 2018-08-22 (×15): qty 1

## 2018-08-22 MED ORDER — FUROSEMIDE 10 MG/ML IJ SOLN
80.0000 mg | Freq: Once | INTRAMUSCULAR | Status: AC
  Administered 2018-08-22: 80 mg via INTRAVENOUS

## 2018-08-22 MED ORDER — ISOSORBIDE MONONITRATE ER 30 MG PO TB24
120.0000 mg | ORAL_TABLET | Freq: Every day | ORAL | Status: DC
  Administered 2018-08-23 – 2018-08-27 (×5): 120 mg via ORAL
  Filled 2018-08-22: qty 2
  Filled 2018-08-22: qty 4
  Filled 2018-08-22 (×2): qty 2
  Filled 2018-08-22: qty 4
  Filled 2018-08-22: qty 2

## 2018-08-22 MED ORDER — SODIUM CHLORIDE 0.9 % IV SOLN: 250.0000 mL | INTRAVENOUS | Status: DC | PRN

## 2018-08-22 MED ORDER — ONDANSETRON HCL 4 MG PO TABS
4.0000 mg | ORAL_TABLET | Freq: Four times a day (QID) | ORAL | Status: DC | PRN
  Administered 2018-08-25: 4 mg via ORAL
  Filled 2018-08-22: qty 1

## 2018-08-22 MED ORDER — DEXMEDETOMIDINE HCL IN NACL 400 MCG/100ML IV SOLN
0.4000 ug/kg/h | INTRAVENOUS | Status: DC
  Administered 2018-08-22: 0.6 ug/kg/h via INTRAVENOUS
  Filled 2018-08-22: qty 100

## 2018-08-22 MED ORDER — FUROSEMIDE 10 MG/ML IJ SOLN
INTRAMUSCULAR | Status: AC
  Administered 2018-08-22: 80 mg via INTRAVENOUS
  Filled 2018-08-22: qty 8

## 2018-08-22 MED ORDER — MORPHINE SULFATE (PF) 2 MG/ML IV SOLN
2.0000 mg | Freq: Once | INTRAVENOUS | Status: AC
  Administered 2018-08-22: 2 mg via INTRAVENOUS

## 2018-08-22 MED ORDER — CARVEDILOL 12.5 MG PO TABS
12.5000 mg | ORAL_TABLET | Freq: Two times a day (BID) | ORAL | Status: DC
  Administered 2018-08-23 – 2018-08-27 (×8): 12.5 mg via ORAL
  Filled 2018-08-22: qty 1
  Filled 2018-08-22 (×4): qty 2
  Filled 2018-08-22 (×2): qty 1
  Filled 2018-08-22 (×2): qty 2

## 2018-08-22 MED ORDER — VITAMIN D 1000 UNITS PO TABS
1000.0000 [IU] | ORAL_TABLET | Freq: Every day | ORAL | Status: DC
  Administered 2018-08-23 – 2018-08-27 (×5): 1000 [IU] via ORAL
  Filled 2018-08-22 (×10): qty 1

## 2018-08-22 MED ORDER — HYDROCODONE-ACETAMINOPHEN 5-325 MG PO TABS
1.0000 | ORAL_TABLET | Freq: Four times a day (QID) | ORAL | Status: DC | PRN
  Administered 2018-08-23: 2 via ORAL
  Administered 2018-08-25: 1 via ORAL
  Administered 2018-08-26 (×2): 2 via ORAL
  Filled 2018-08-22: qty 2
  Filled 2018-08-22: qty 1
  Filled 2018-08-22 (×2): qty 2

## 2018-08-22 MED ORDER — AMLODIPINE BESYLATE 10 MG PO TABS
10.0000 mg | ORAL_TABLET | Freq: Every day | ORAL | Status: DC
  Administered 2018-08-23 – 2018-08-27 (×5): 10 mg via ORAL
  Filled 2018-08-22: qty 1
  Filled 2018-08-22 (×3): qty 2
  Filled 2018-08-22: qty 1

## 2018-08-22 MED ORDER — COLCHICINE 0.6 MG PO TABS: 0.6000 mg | ORAL_TABLET | ORAL | Status: DC

## 2018-08-22 MED ORDER — DORZOLAMIDE HCL-TIMOLOL MAL 2-0.5 % OP SOLN
1.0000 [drp] | Freq: Two times a day (BID) | OPHTHALMIC | Status: DC
  Administered 2018-08-22 – 2018-08-27 (×10): 1 [drp] via OPHTHALMIC
  Filled 2018-08-22: qty 10

## 2018-08-22 MED ORDER — SODIUM CHLORIDE 0.9% FLUSH: 3.0000 mL | INTRAVENOUS | Status: DC | PRN

## 2018-08-22 MED ORDER — KETOCONAZOLE 2 % EX SHAM: 1.0000 "application " | MEDICATED_SHAMPOO | CUTANEOUS | Status: DC

## 2018-08-22 MED ORDER — HEPARIN SODIUM (PORCINE) 5000 UNIT/ML IJ SOLN
5000.0000 [IU] | Freq: Three times a day (TID) | INTRAMUSCULAR | Status: DC
  Administered 2018-08-22 – 2018-08-27 (×16): 5000 [IU] via SUBCUTANEOUS
  Filled 2018-08-22 (×15): qty 1

## 2018-08-22 MED ORDER — PANTOPRAZOLE SODIUM 40 MG PO TBEC
40.0000 mg | DELAYED_RELEASE_TABLET | Freq: Every day | ORAL | Status: DC
  Administered 2018-08-23 – 2018-08-26 (×4): 40 mg via ORAL
  Filled 2018-08-22 (×5): qty 1

## 2018-08-22 MED ORDER — DOCUSATE SODIUM 100 MG PO CAPS
100.0000 mg | ORAL_CAPSULE | Freq: Two times a day (BID) | ORAL | Status: DC
  Administered 2018-08-23 – 2018-08-27 (×8): 100 mg via ORAL
  Filled 2018-08-22 (×11): qty 1

## 2018-08-22 MED ORDER — CLONIDINE HCL 0.1 MG PO TABS
0.3000 mg | ORAL_TABLET | Freq: Three times a day (TID) | ORAL | Status: DC
  Administered 2018-08-22 – 2018-08-24 (×6): 0.3 mg via ORAL
  Filled 2018-08-22 (×6): qty 3

## 2018-08-22 MED ORDER — FLUTICASONE PROPIONATE 50 MCG/ACT NA SUSP
1.0000 | Freq: Every day | NASAL | Status: DC | PRN
  Administered 2018-08-25: 1 via NASAL
  Filled 2018-08-22: qty 16

## 2018-08-22 MED ORDER — FLAX SEED OIL 1000 MG PO CAPS: 1300.0000 mg | ORAL_CAPSULE | Freq: Every day | ORAL | Status: DC

## 2018-08-22 MED ORDER — ASPIRIN EC 81 MG PO TBEC
81.0000 mg | DELAYED_RELEASE_TABLET | Freq: Every day | ORAL | Status: DC
  Administered 2018-08-23 – 2018-08-27 (×5): 81 mg via ORAL
  Filled 2018-08-22 (×5): qty 1

## 2018-08-22 NOTE — Progress Notes (Signed)
STAFF NOTE: I, Dr Lahoma Rocker have personally reviewed patient's available data, including medical history, events of note, physical examination and test results as part of my evaluation. I have discussed with resident/NP and other care providers such as pharmacist, RN and RRT.  In addition,  I personally evaluated patient and elicited key findings of   This is a 77 yo male with a PMH of OSA, Prostate Cancer, MI, HTN, HLD, GERD, Esophageal Cancer, Diabetes Mellitus, CVA, CKD-Stage V, Carotid Stenosis, and CAD s/p CABG.  He presented to [facility] ER on 09/26 with c/o shortness of breath onset of symptoms the morning of 09/26.  He saw his outpatient nephrologist on 07/25/2018 with plans to start HD once AV fistula site matured (AVF placed 05/2018), pt instructed to follow-up in 4-6 weeks during that office visit if site not matured would undergo [place] with balloon.  In the ER pt placed on Bipap due to severe respiratory distress.  CXR revealed pulmonary edema and BNP 903.  Lab results revealed BUN 106, creatinine 5.45, glucose 150, troponin <0.03, hgb 9.1, and venous gas pH 7.32/pCO2 49.0.   O/E comfortable on bipap Bilateral 2-3 plus leg edema Crackles at base  Assessment:   Acute rest failure: Due to acute pulmonary edema -Agree with Lasix drip -BiPAP for work of breathing - Serial cardiac enzymes -- RPT Xray and ABG in am - Echo - Nephrology evaluation - Control blood pressure if required start nitro drip - GI DVT prophylaxis - Nothing to suggest any infectious etiology at this point monitor clinically   Skin/Wound: chronic changes  Electrolytes: Replace electrolytes per ICU electrolyte replacement protocol.   IVF: none  Nutrition: Cardiac renal diet as tolerated   Prophylaxis: DVT Prophylaxis with heparin,. GI Prophylaxis.   Restraints: None  PT/OT eval and treat. OOB when appropriate.   Lines/Tubes:  08/22/18 foley  No central line.  ADVANCE DIRECTIVE:Full code  FAMILY  DISCUSSION:Spoke with patient   Quality Care: PPI, DVT prophylaxis, HOB elevated, Infection control all reviewed and addressed.  Events and notes from last 24 hours reviewed. Care plan discussed on multidisciplinary rounds  CC TIME:32 min    Old records reviewed discussed results and management plan with patient  Images personally reviewed and results and labs reviewed and discussed with patient.  All medication reviewed and adjusted  Further management depending on test results and work up as outlined above.    Lahoma Rocker, M.D

## 2018-08-22 NOTE — ED Triage Notes (Addendum)
Pt arrived via EMS from home with reports of shortness of breath that started this morning. Pt is supposed to be starting HD and had recent fistula placed.  On arrival to house per EMS pt's sats were 90% on RA, was placed on 10L NRB by the FD  Pt reports shortness of breath on exertion.  On arrival to ED, pt immediately stood up and walked to toilet in room to have BM - Dr. Jimmye Norman in room.

## 2018-08-22 NOTE — Consult Note (Signed)
Name: Cory Miller MRN: [id number] DOB: Nov 14, 1941    ADMISSION DATE:  08/22/2018 CONSULTATION DATE: 08/22/2018  REFERRING MD : Dr. Estanislado Pandy   CHIEF COMPLAINT: Shortness of Breath   BRIEF PATIENT DESCRIPTION:  77 yo male with Stage V CKD (AVF placed 05/2018 once mature will start HD) admitted with acute respiratory failure fluid overload requiring Bipap and lasix gtt.   SIGNIFICANT EVENTS/STUDIES:  09/26-Pt admitted to stepdown unit on Bipap   HISTORY OF PRESENT ILLNESS:   This is a 77 yo male with a PMH of OSA, Prostate Cancer, MI, HTN, HLD, GERD, Esophageal Cancer, Diabetes Mellitus, CVA, CKD-Stage V, Carotid Stenosis, and CAD s/p CABG.  He presented to [facility] ER on 09/26 with c/o shortness of breath onset of symptoms the morning of 09/26.  He saw his outpatient nephrologist on 07/25/2018 with plans to start HD once AV fistula site matured (AVF placed 05/2018), pt instructed to follow-up in 4-6 weeks during that office visit if site not matured would undergo [place] with balloon.  In the ER pt placed on Bipap due to severe respiratory distress.  CXR revealed pulmonary edema and BNP 903.  Lab results revealed BUN 106, creatinine 5.45, glucose 150, troponin <0.03, hgb 9.1, and venous gas pH 7.32/pCO2 49.0.  He received 80 mg iv lasix x1 dose and lasix gtt initiated.  He was subsequently admitted to the stepdown unit by hospitalist team for further workup and treatment.  PAST MEDICAL HISTORY :   has a past medical history of Blood dyscrasia, CAD (coronary artery disease) of artery bypass graft, Carotid atherosclerosis, Carotid stenosis, Chronic kidney disease, CKD (chronic kidney disease), stage V ([place]), CVA (cerebrovascular accident) ([place]), Diabetes mellitus without complication ([place]), Dyspnea, ED (erectile dysfunction), Elevated troponin I level, Esophageal ulcer, GERD (gastroesophageal reflux disease), Gout, Hard of hearing, Hyperlipidemia, Hypertension, MI (myocardial infarction) ([place]),  Neuropathy, Occlusion of both internal carotid arteries, Prostate CA ([place]), and Sleep apnea.  has a past surgical history that includes Arterial bypass surgry; Esophagogastroduodenoscopy (N/A, 08/05/2015); Esophagogastroduodenoscopy (egd) with propofol (N/A, 09/30/2015); Coronary artery bypass graft; Coronary angioplasty; ostate cryoablation; Carotid endarterectomy (Left); partial thoracoscopy (6/03); decortication,parietal pleurectomy (Left, 05/2002); chest tubes x 2; Colonoscopy with propofol (N/A, 06/02/2016); Colonoscopy with propofol (N/A, 07/28/2016); and AV fistula placement (Right, 06/14/2018). Prior to Admission medications   Medication Sig Start Date End Date Taking? Authorizing Provider  allopurinol (ZYLOPRIM) 100 MG tablet Take 100 mg by mouth daily.   Yes [provider]  amLODipine (NORVASC) 10 MG tablet Take 10 mg by mouth daily.   Yes [provider]  aspirin 81 MG tablet Take 81 mg by mouth daily.   Yes [provider]  atorvastatin (LIPITOR) 40 MG tablet Take 40 mg by mouth at bedtime.   Yes [provider]  carvedilol (COREG) 12.5 MG tablet Take 12.5 mg by mouth 2 (two) times daily with a meal.  09/25/17  Yes [provider]  cholecalciferol (VITAMIN D) 1000 units tablet Take 1,000 Units by mouth daily.    Yes [provider]  cloNIDine (CATAPRES) 0.3 MG tablet Take 0.3 mg by mouth 3 (three) times daily.    Yes [provider]  clopidogrel (PLAVIX) 75 MG tablet Take 75 mg by mouth daily.   Yes [provider]  Cod Liver Oil CAPS Take 1 capsule by mouth daily.    Yes [provider]  colchicine 0.6 MG tablet Take 0.6 mg by mouth every other day.    Yes [provider]  docusate  sodium (COLACE) 100 MG capsule Take 100 mg by mouth 2 (two) times daily.   Yes [provider]  dorzolamide-timolol (COSOPT) 22.3-6.8 MG/ML ophthalmic solution Place 1 drop into both eyes 2 (two) times daily.  02/28/18  Yes  [provider]  Flaxseed, Linseed, (FLAX SEED OIL) 1000 MG CAPS Take 1,300 mg by mouth daily.    Yes [provider]  fluticasone (FLONASE) 50 MCG/ACT nasal spray Place 1 spray into both nostrils daily as needed for allergies.    Yes [provider]  furosemide (LASIX) 40 MG tablet Take 40 mg by mouth 2 (two) times daily.    Yes [provider]  hydrALAZINE (APRESOLINE) 100 MG tablet Take 100 mg by mouth 3 (three) times daily.    Yes [provider]  hydrOXYzine (ATARAX/VISTARIL) 25 MG tablet Take 25 mg by mouth 4 (four) times daily as needed for itching.    Yes [provider]  insulin aspart (NOVOLOG) 100 UNIT/ML FlexPen Inject 10 Units into the skin 3 (three) times daily with meals.    Yes [provider]  insulin detemir (LEVEMIR) 100 UNIT/ML injection Inject 35 Units into the skin 2 (two) times daily.    Yes [provider]  isosorbide mononitrate (IMDUR) 120 MG 24 hr tablet Take 120 mg by mouth daily.   Yes [provider]  ketoconazole (NIZORAL) 2 % shampoo Apply 1 application topically 2 (two) times a week.  11/30/17  Yes [provider]  loratadine (CLARITIN) 10 MG tablet Take 10 mg by mouth daily.   Yes [provider]  oxyCODONE-acetaminophen (PERCOCET/ROXICET) 5-325 MG per tablet Take 1 tablet by mouth 3 (three) times daily.   Yes [provider]  pantoprazole (PROTONIX) 40 MG tablet Take 1 tablet (40 mg total) by mouth daily. 40 mg BID for 1 month followed by once daily Patient taking differently: Take 40 mg by mouth 2 (two) times daily.  08/06/15  Yes Max Sane, MD  SPS 15 GM/60ML suspension Take 60 mLs by mouth as directed. When eating potassium rich foods 06/05/18  Yes [provider]  triamcinolone cream (KENALOG) 0.1 % Apply 1 application topically 2 (two) times daily as needed (ITCHING).    Yes [provider]  HYDROcodone-acetaminophen (NORCO) 5-325 MG tablet  Take 1-2 tablets by mouth every 6 (six) hours as needed for moderate pain or severe pain. Patient not taking: Reported on 08/22/2018 06/14/18   Schnier, Dolores Lory, MD   No Known Allergies  FAMILY HISTORY:  family history includes CAD in his unknown relative; Cancer in his unknown relative; Diabetes in his unknown relative; Stroke in his unknown relative. SOCIAL HISTORY:  reports that he has quit smoking. He has never used smokeless tobacco. He reports that he drinks alcohol. He reports that he does not use drugs.  REVIEW OF SYSTEMS:   Unable to assess pt on Bipap   SUBJECTIVE:  Unable to assess pt on Bipap   VITAL SIGNS: Temp:  [97.6 F (36.4 C)] 97.6 F (36.4 C) (09/26 1237) Pulse Rate:  [79-100] 86 (09/26 1539) Resp:  [23-28] 25 (09/26 1539) BP: (151-183)/(52-68) 182/66 (09/26 1500) SpO2:  [85 %-100 %] 98 % (09/26 1539) Weight:  [841 kg] 126 kg (09/26 1238)  PHYSICAL EXAMINATION: General: acutely ill appearing male, NAD on Bipap  Neuro: alert and oriented, follows commands, very HOH  HEENT: supple, JVD present  Cardiovascular: nsr, rrr, no R/G Lungs: faint crackles throughout, even, non labored  Abdomen: +BS x4, obese,  soft, non tender, non distended  Musculoskeletal: 2+ bilateral lower extremity edema, normal tone  Skin: intact no rashes or lesions, RUE AVF +bruit and thrill   Recent Labs  Lab 08/22/18 1256  NA 139  K 3.9  CL 109  CO2 19*  BUN 106*  CREATININE 5.45*  GLUCOSE 150*   Recent Labs  Lab 08/22/18 1256  HGB 9.1*  HCT 27.4*  WBC 6.3  PLT 242   Dg Chest Port 1 View  Result Date: 08/22/2018 CLINICAL DATA:  Shortness of breath. EXAM: PORTABLE CHEST 1 VIEW COMPARISON:  Radiographs of January 02, 2017. FINDINGS: Stable cardiomegaly. Status post coronary artery bypass graft. Atherosclerosis of thoracic aorta is noted. No pneumothorax is noted. Increased bilateral lung opacities are noted concerning for pneumonia or edema. Hypoinflation of the lungs are  noted. Mild left pleural effusion is noted. Bony thorax is unremarkable. IMPRESSION: Increased bilateral lung opacities are noted concerning for pneumonia or edema. Mild left pleural effusion is noted. Aortic Atherosclerosis (ICD10-I70.0). Electronically Signed   By: Marijo Conception, M.D.   On: 08/22/2018 13:35    ASSESSMENT / PLAN: Acute respiratory failure secondary to pulmonary edema  Acute on chronic renal failure (followed by outpatient nephrology plans for HD once AVF matured) Hx: OSA  Prn Bipap for dyspnea and/or hypoxia  Continue lasix gtt Trend BMP  Replace electrolytes as indicated  Monitor UOP Avoid nephrotoxic medications  Nephrology consulted appreciate input Will consult vascular to assess AVF site for maturity   HTN Hyperlipidemia  Hx: CVA, CAD s/p CABG Continuous telemetry monitoring  Continue outpatient atorvastatin, aspirin, amlodipine, clonidine, plavix, and imdur Echo pending   Anemia without obvious acute blood loss VTE px: subq heparin  Trend CBC  Monitor for s/sx of bleeding and transfuse for hgb <7  GERD Hx: Esophageal ulcer  SUP px: po protonix   Type II Diabetes Mellitus  CBG's ac/hs SSI   Marda Stalker, [place] Pager [phone] (please enter 7 digits) PCCM Consult Pager [phone] (please enter 7 digits)

## 2018-08-22 NOTE — Progress Notes (Signed)
Advanced care plan.  Purpose of the Encounter: CODE STATUS  Parties in Attendance:Patient  Patient's Decision Capacity:Good  Subjective/Patient's story: Presented to emergency room with edema, swelling and shortness of breath.   Objective/Medical story Has generalized anasarca needs IV Lasix drip for diuresis BiPAP for respiratory failure   Goals of care determination:  Advance care directives and goals of care discussed Patient wants everything done which includes CPR, intubation and ventilator if the need arises   CODE STATUS: Full code   Time spent discussing advanced care planning: 16 minutes

## 2018-08-22 NOTE — Consult Note (Signed)
Date: 08/22/2018                  Patient Name:  Cory Miller  MRN: [id number]  DOB: 05-17-1941  Age / Sex: 77 y.o., male         PCP: [name], MD                 Service Requesting Consult: IM/ Saundra Shelling, MD                 Reason for Consult: CKD5, Acute Pulm edema            History of Present Illness: Patient is a 77 y.o. male with medical problems of CKD st 5 followed by [facility], hypertension, diabetes, coronary disease who was admitted to [facility] on 08/22/2018 for evaluation of shortness of breath.  Patient came to the emergency room via EMS from home.  Evaluation by EMS showed oxygen saturation 90% on room air.  He was placed on 10 L nonrebreather mask.  Chest x-ray showed increased bilateral lung opacities concerning for pneumonia or edema Lab results show critically elevated creatinine of 5.5 and BUN of 106.  Potassium low at 3.9 Patient transferred to ICU for respiratory support.  He is currently on positive pressure ventilation.  He was placed on IV Lasix infusion.  He is responding well and appears to have good response to IV Lasix   Medications: Outpatient medications: Medications Prior to Admission  Medication Sig Dispense Refill Last Dose  . allopurinol (ZYLOPRIM) 100 MG tablet Take 100 mg by mouth daily.   08/21/2018 at 0800  . amLODipine (NORVASC) 10 MG tablet Take 10 mg by mouth daily.   08/21/2018 at 0800  . aspirin 81 MG tablet Take 81 mg by mouth daily.   08/21/2018 at 0800  . atorvastatin (LIPITOR) 40 MG tablet Take 40 mg by mouth at bedtime.   08/21/2018 at 2000  . carvedilol (COREG) 12.5 MG tablet Take 12.5 mg by mouth 2 (two) times daily with a meal.    08/21/2018 at 2000  . cholecalciferol (VITAMIN D) 1000 units tablet Take 1,000 Units by mouth daily.    08/21/2018 at 0800  . cloNIDine (CATAPRES) 0.3 MG tablet Take 0.3 mg by mouth 3 (three) times daily.    08/21/2018 at 1900  . clopidogrel (PLAVIX) 75 MG tablet Take 75 mg by mouth daily.    08/21/2018 at 0800  . Cod Liver Oil CAPS Take 1 capsule by mouth daily.    08/21/2018 at Unknown time  . colchicine 0.6 MG tablet Take 0.6 mg by mouth every other day.    Unknown at PRN  . docusate sodium (COLACE) 100 MG capsule Take 100 mg by mouth 2 (two) times daily.   08/21/2018 at 1800  . dorzolamide-timolol (COSOPT) 22.3-6.8 MG/ML ophthalmic solution Place 1 drop into both eyes 2 (two) times daily.    08/21/2018 at 2000  . Flaxseed, Linseed, (FLAX SEED OIL) 1000 MG CAPS Take 1,300 mg by mouth daily.    08/21/2018 at Unknown time  . fluticasone (FLONASE) 50 MCG/ACT nasal spray Place 1 spray into both nostrils daily as needed for allergies.    Unknown at PRN  . furosemide (LASIX) 40 MG tablet Take 40 mg by mouth 2 (two) times daily.    08/21/2018 at 2000  . hydrALAZINE (APRESOLINE) 100 MG tablet Take 100 mg by mouth 3 (three) times daily.    08/21/2018 at 1900  . hydrOXYzine (ATARAX/VISTARIL) 25 MG  tablet Take 25 mg by mouth 4 (four) times daily as needed for itching.    Past Week at PRN  . insulin aspart (NOVOLOG) 100 UNIT/ML FlexPen Inject 10 Units into the skin 3 (three) times daily with meals.    08/21/2018 at Unknown time  . insulin detemir (LEVEMIR) 100 UNIT/ML injection Inject 35 Units into the skin 2 (two) times daily.    08/21/2018 at 1800  . isosorbide mononitrate (IMDUR) 120 MG 24 hr tablet Take 120 mg by mouth daily.   08/21/2018 at 0800  . ketoconazole (NIZORAL) 2 % shampoo Apply 1 application topically 2 (two) times a week.    As directed at As directed  . loratadine (CLARITIN) 10 MG tablet Take 10 mg by mouth daily.   08/21/2018 at 0800  . oxyCODONE-acetaminophen (PERCOCET/ROXICET) 5-325 MG per tablet Take 1 tablet by mouth 3 (three) times daily.   08/21/2018 at 1800  . pantoprazole (PROTONIX) 40 MG tablet Take 1 tablet (40 mg total) by mouth daily. 40 mg BID for 1 month followed by once daily (Patient taking differently: Take 40 mg by mouth 2 (two) times daily. ) 60 tablet 0 08/21/2018 at 2000   . SPS 15 GM/60ML suspension Take 60 mLs by mouth as directed. When eating potassium rich foods  3 Unknown at PRN  . triamcinolone cream (KENALOG) 0.1 % Apply 1 application topically 2 (two) times daily as needed (ITCHING).    Unknown at PRN  . HYDROcodone-acetaminophen (NORCO) 5-325 MG tablet Take 1-2 tablets by mouth every 6 (six) hours as needed for moderate pain or severe pain. (Patient not taking: Reported on 08/22/2018) 40 tablet 0 Not Taking at Unknown time    Current medications: Current Facility-Administered Medications  Medication Dose Route Frequency Provider Last Rate Last Dose  . 0.9 %  sodium chloride infusion  250 mL Intravenous PRN Pyreddy, Reatha Harps, MD      . acetaminophen (TYLENOL) tablet 650 mg  650 mg Oral Q6H PRN Pyreddy, Reatha Harps, MD       Or  . acetaminophen (TYLENOL) suppository 650 mg  650 mg Rectal Q6H PRN Pyreddy, Reatha Harps, MD      . allopurinol (ZYLOPRIM) tablet 100 mg  100 mg Oral Daily Pyreddy, Pavan, MD      . amLODipine (NORVASC) tablet 10 mg  10 mg Oral Daily Pyreddy, Pavan, MD      . aspirin EC tablet 81 mg  81 mg Oral Daily Pyreddy, Pavan, MD      . atorvastatin (LIPITOR) tablet 40 mg  40 mg Oral QHS Pyreddy, Pavan, MD      . carvedilol (COREG) tablet 12.5 mg  12.5 mg Oral BID WC Pyreddy, Reatha Harps, MD      . Derrill Memo ON 08/23/2018] cholecalciferol (VITAMIN D) tablet 1,000 Units  1,000 Units Oral Daily Pyreddy, Pavan, MD      . cloNIDine (CATAPRES) tablet 0.3 mg  0.3 mg Oral TID Saundra Shelling, MD      . clopidogrel (PLAVIX) tablet 75 mg  75 mg Oral Daily Pyreddy, Pavan, MD      . docusate sodium (COLACE) capsule 100 mg  100 mg Oral BID Pyreddy, Pavan, MD      . dorzolamide-timolol (COSOPT) 22.3-6.8 MG/ML ophthalmic solution 1 drop  1 drop Both Eyes BID Pyreddy, Pavan, MD      . fluticasone (FLONASE) 50 MCG/ACT nasal spray 1 spray  1 spray Each Nare Daily PRN Pyreddy, Pavan, MD      . furosemide (LASIX) 250 mg in  dextrose 5 % 250 mL (1 mg/mL) infusion  6 mg/hr Intravenous  Continuous Earleen Newport, MD 6 mL/hr at 08/22/18 1422 6 mg/hr at 08/22/18 1422  . heparin injection 5,000 Units  5,000 Units Subcutaneous Q8H Pyreddy, Reatha Harps, MD   5,000 Units at 08/22/18 1615  . hydrALAZINE (APRESOLINE) tablet 100 mg  100 mg Oral TID Saundra Shelling, MD      . HYDROcodone-acetaminophen (NORCO/VICODIN) 5-325 MG per tablet 1-2 tablet  1-2 tablet Oral Q6H PRN Saundra Shelling, MD      . hydrOXYzine (ATARAX/VISTARIL) tablet 25 mg  25 mg Oral Q6H PRN Pyreddy, Pavan, MD      . insulin aspart (novoLOG) injection 0-15 Units  0-15 Units Subcutaneous TID WC Saundra Shelling, MD   2 Units at 08/22/18 1612  . insulin aspart (novoLOG) injection 0-5 Units  0-5 Units Subcutaneous QHS Pyreddy, Pavan, MD      . isosorbide mononitrate (IMDUR) 24 hr tablet 120 mg  120 mg Oral Daily Pyreddy, Pavan, MD      . loratadine (CLARITIN) tablet 10 mg  10 mg Oral Daily Pyreddy, Pavan, MD      . ondansetron (ZOFRAN) tablet 4 mg  4 mg Oral Q6H PRN Pyreddy, Reatha Harps, MD       Or  . ondansetron (ZOFRAN) injection 4 mg  4 mg Intravenous Q6H PRN Pyreddy, Pavan, MD      . pantoprazole (PROTONIX) EC tablet 40 mg  40 mg Oral QHS Pyreddy, Pavan, MD      . sodium chloride flush (NS) 0.9 % injection 3 mL  3 mL Intravenous Q12H Pyreddy, Pavan, MD      . sodium chloride flush (NS) 0.9 % injection 3 mL  3 mL Intravenous PRN Saundra Shelling, MD          Allergies: No Known Allergies    Past Medical History: Past Medical History:  Diagnosis Date  . Blood dyscrasia    prostate  . CAD (coronary artery disease) of artery bypass graft   . Carotid atherosclerosis   . Carotid stenosis   . Chronic kidney disease   . CKD (chronic kidney disease), stage V ([place])   . CVA (cerebrovascular accident) ([place])   . Diabetes mellitus without complication ([place])   . Dyspnea   . ED (erectile dysfunction)   . Elevated troponin I level   . Esophageal ulcer   . GERD (gastroesophageal reflux disease)   . Gout   . Hard of hearing     Pt very hard  of hearing  . Hyperlipidemia   . Hypertension   . MI (myocardial infarction) ([place])   . Neuropathy   . Occlusion of both internal carotid arteries   . Prostate CA ([place])   . Sleep apnea      Past Surgical History: Past Surgical History:  Procedure Laterality Date  . ARTERIAL BYPASS SURGRY    . AV FISTULA PLACEMENT Right 06/14/2018   Procedure: ARTERIOVENOUS (AV) FISTULA CREATION(brachiocephalic);  Surgeon: Katha Cabal, MD;  Location: [facility] ORS;  Service: Vascular;  Laterality: Right;  . CAROTID ENDARTERECTOMY Left   . chest tubes x 2     for pneumothorax  . COLONOSCOPY WITH PROPOFOL N/A 06/02/2016   Procedure: COLONOSCOPY WITH PROPOFOL;  Surgeon: Lollie Sails, MD;  Location: [facility] ENDOSCOPY;  Service: Endoscopy;  Laterality: N/A;  . COLONOSCOPY WITH PROPOFOL N/A 07/28/2016   Procedure: COLONOSCOPY WITH PROPOFOL;  Surgeon: Lollie Sails, MD;  Location: [facility] ENDOSCOPY;  Service: Endoscopy;  Laterality: N/A;  .  CORONARY ANGIOPLASTY    . CORONARY ARTERY BYPASS GRAFT    . decortication,parietal pleurectomy Left 05/2002  . ESOPHAGOGASTRODUODENOSCOPY N/A 08/05/2015   Procedure: ESOPHAGOGASTRODUODENOSCOPY (EGD);  Surgeon: Hulen Luster, MD;  Location: [facility] ENDOSCOPY;  Service: Endoscopy;  Laterality: N/A;  . ESOPHAGOGASTRODUODENOSCOPY (EGD) WITH PROPOFOL N/A 09/30/2015   Procedure: ESOPHAGOGASTRODUODENOSCOPY (EGD) WITH PROPOFOL;  Surgeon: Hulen Luster, MD;  Location: [facility] ENDOSCOPY;  Service: Gastroenterology;  Laterality: N/A;  . ostate cryoablation    . partial thoracoscopy  6/03     Family History: Family History  Problem Relation Age of Onset  . CAD Unknown   . Diabetes Unknown   . Cancer Unknown   . Stroke Unknown      Social History: Social History   Socioeconomic History  . Marital status: Divorced    Spouse name: Not on file  . Number of children: Not on file  . Years of education: Not on file  . Highest education level: Not on file  Occupational  History  . Not on file  Social Needs  . Financial resource strain: Not on file  . Food insecurity:    Worry: Not on file    Inability: Not on file  . Transportation needs:    Medical: Not on file    Non-medical: Not on file  Tobacco Use  . Smoking status: Former Research scientist (life sciences)  . Smokeless tobacco: Never Used  Substance and Sexual Activity  . Alcohol use: Yes    Alcohol/week: 0.0 standard drinks  . Drug use: No  . Sexual activity: Not on file  Lifestyle  . Physical activity:    Days per week: Not on file    Minutes per session: Not on file  . Stress: Not on file  Relationships  . Social connections:    Talks on phone: Not on file    Gets together: Not on file    Attends religious service: Not on file    Active member of club or organization: Not on file    Attends meetings of clubs or organizations: Not on file    Relationship status: Not on file  . Intimate partner violence:    Fear of current or ex partner: Not on file    Emotionally abused: Not on file    Physically abused: Not on file    Forced sexual activity: Not on file  Other Topics Concern  . Not on file  Social History Narrative  . Not on file     Review of Systems: Not available due to patient being on NIPPV Gen:  HEENT:  CV:  Resp:  GI: GU :  MS:  Derm:   Psych: Heme:  Neuro:  Endocrine  Vital Signs: Blood pressure (!) 160/59, pulse 82, temperature 97.8 F (36.6 C), temperature source Axillary, resp. rate 18, height 6\' 1"  (1.854 m), weight 126 kg, SpO2 100 %.   Intake/Output Summary (Last 24 hours) at 08/22/2018 1646 Last data filed at 08/22/2018 1450 Gross per 24 hour  Intake -  Output 420 ml  Net -420 ml    Weight trends: [employer]   08/22/18 1238  Weight: 126 kg    Physical Exam: General:  Chronically ill-appearing, laying in the bed  HEENT  anicteric, BiPAP mask in place  Neck:  Supple  Lungs:  Bilatera diffuse crackles   Heart::  Regular, no rub  Abdomen:  Soft, mildly  distended  Extremities:  Significant dependent edema  Neurologic:  Alert, able to follow few simple commands  Skin:  No acute rashes  Access:  Developing right arm AV fistula  Foley:  In place       Lab results: Basic Metabolic Panel: Recent Labs  Lab 08/22/18 1256  NA 139  K 3.9  CL 109  CO2 19*  GLUCOSE 150*  BUN 106*  CREATININE 5.45*  CALCIUM 8.7*    Liver Function Tests: No results for input(s): AST, ALT, ALKPHOS, BILITOT, PROT, ALBUMIN in the last 168 hours. No results for input(s): LIPASE, AMYLASE in the last 168 hours. No results for input(s): AMMONIA in the last 168 hours.  CBC: Recent Labs  Lab 08/22/18 1256  WBC 6.3  NEUTROABS 5.2  HGB 9.1*  HCT 27.4*  MCV 93.7  PLT 242    Cardiac Enzymes: Recent Labs  Lab 08/22/18 1256  TROPONINI <0.03    BNP: Invalid input(s): POCBNP  CBG: Recent Labs  Lab 08/22/18 1533  GLUCAP 144*    Microbiology: No results found for this or any previous visit (from the past 720 hour(s)).   Coagulation Studies: No results for input(s): LABPROT, INR in the last 72 hours.  Urinalysis: No results for input(s): COLORURINE, LABSPEC, PHURINE, GLUCOSEU, HGBUR, BILIRUBINUR, KETONESUR, PROTEINUR, UROBILINOGEN, NITRITE, LEUKOCYTESUR in the last 72 hours.  Invalid input(s): APPERANCEUR      Imaging: Dg Chest Port 1 View  Result Date: 08/22/2018 CLINICAL DATA:  Shortness of breath. EXAM: PORTABLE CHEST 1 VIEW COMPARISON:  Radiographs of January 02, 2017. FINDINGS: Stable cardiomegaly. Status post coronary artery bypass graft. Atherosclerosis of thoracic aorta is noted. No pneumothorax is noted. Increased bilateral lung opacities are noted concerning for pneumonia or edema. Hypoinflation of the lungs are noted. Mild left pleural effusion is noted. Bony thorax is unremarkable. IMPRESSION: Increased bilateral lung opacities are noted concerning for pneumonia or edema. Mild left pleural effusion is noted. Aortic  Atherosclerosis (ICD10-I70.0). Electronically Signed   By: Marijo Conception, M.D.   On: 08/22/2018 13:35      Assessment & Plan: Pt is a 77 y.o. African-American  male with with a PMH of OSA, Prostate Cancer, MI, HTN, HLD, GERD, Esophageal Cancer, Diabetes Mellitus, CVA, CKD-Stage V, Carotid Stenosis, and CAD s/p CABG.  1.  Chronic kidney disease stage V 2.  Acute pulmonary edema 3.  Hypertension 4.  Peripheral edema  Patient has advanced chronic kidney disease and is nearing end-stage renal disease.  He has an AV fistula which has never been used.  Currently requiring noninvasive positive pressure ventilation for acute pulmonary edema. He was placed on IV Lasix infusion and appears to be responding well.  We will continue to monitor.  If he does not improve clinically, we may attempt to start hemodialysis tomorrow Continue close clinical monitoring.      LOS: 0 Jerlisa Diliberto Candiss Norse 9/26/20194:46 PM  [place], [place]  Note: This note was prepared with Dragon dictation. Any transcription errors are unintentional

## 2018-08-22 NOTE — ED Provider Notes (Signed)
[facility] Emergency Department Provider Note       Time seen: ----------------------------------------- 12:25 PM on 08/22/2018 -----------------------------------------   I have reviewed the triage vital signs and the nursing notes.  HISTORY   Chief Complaint Shortness of Breath    HPI Cory Miller is a 77 y.o. male with a history of coronary artery disease, chronic kidney disease, CVA, diabetes, GERD, hyperlipidemia and hypertension with plans for dialysis but no dialysis started yet who presents to the ED for of breath.  Patient reports shortness of breath started around 10 AM this morning.  He denies fevers, chills or other complaints.  Again he has not started dialysis yet but he does have an AV fistula.  Past Medical History:  Diagnosis Date  . Blood dyscrasia    prostate  . CAD (coronary artery disease) of artery bypass graft   . Carotid atherosclerosis   . Carotid stenosis   . Chronic kidney disease   . CKD (chronic kidney disease), stage V ([place])   . CVA (cerebrovascular accident) ([place])   . Diabetes mellitus without complication ([place])   . Dyspnea   . ED (erectile dysfunction)   . Elevated troponin I level   . Esophageal ulcer   . GERD (gastroesophageal reflux disease)   . Gout   . Hard of hearing    Pt very hard  of hearing  . Hyperlipidemia   . Hypertension   . MI (myocardial infarction) ([place])   . Neuropathy   . Occlusion of both internal carotid arteries   . Prostate CA ([place])   . Sleep apnea     Patient Active Problem List   Diagnosis Date Noted  . Sepsis ([place]) 04/04/2016  . Diabetes ([place]) 04/04/2016  . HTN (hypertension) 04/04/2016  . GERD (gastroesophageal reflux disease) 04/04/2016  . CAD (coronary artery disease) 04/04/2016  . CKD (chronic kidney disease), stage IV ([place]) 04/04/2016  . Abdominal pain 08/02/2015  . Abdominal pain, acute, epigastric   . Pain in the abdomen     Past Surgical History:  Procedure  Laterality Date  . ARTERIAL BYPASS SURGRY    . AV FISTULA PLACEMENT Right 06/14/2018   Procedure: ARTERIOVENOUS (AV) FISTULA CREATION(brachiocephalic);  Surgeon: Katha Cabal, MD;  Location: [facility] ORS;  Service: Vascular;  Laterality: Right;  . CAROTID ENDARTERECTOMY Left   . chest tubes x 2     for pneumothorax  . COLONOSCOPY WITH PROPOFOL N/A 06/02/2016   Procedure: COLONOSCOPY WITH PROPOFOL;  Surgeon: Lollie Sails, MD;  Location: [facility] ENDOSCOPY;  Service: Endoscopy;  Laterality: N/A;  . COLONOSCOPY WITH PROPOFOL N/A 07/28/2016   Procedure: COLONOSCOPY WITH PROPOFOL;  Surgeon: Lollie Sails, MD;  Location: [facility] ENDOSCOPY;  Service: Endoscopy;  Laterality: N/A;  . CORONARY ANGIOPLASTY    . CORONARY ARTERY BYPASS GRAFT    . decortication,parietal pleurectomy Left 05/2002  . ESOPHAGOGASTRODUODENOSCOPY N/A 08/05/2015   Procedure: ESOPHAGOGASTRODUODENOSCOPY (EGD);  Surgeon: Hulen Luster, MD;  Location: [facility] ENDOSCOPY;  Service: Endoscopy;  Laterality: N/A;  . ESOPHAGOGASTRODUODENOSCOPY (EGD) WITH PROPOFOL N/A 09/30/2015   Procedure: ESOPHAGOGASTRODUODENOSCOPY (EGD) WITH PROPOFOL;  Surgeon: Hulen Luster, MD;  Location: [facility] ENDOSCOPY;  Service: Gastroenterology;  Laterality: N/A;  . ostate cryoablation    . partial thoracoscopy  6/03    Allergies Patient has no known allergies.  Social History Social History   Tobacco Use  . Smoking status: Former Research scientist (life sciences)  . Smokeless tobacco: Never Used  Substance Use Topics  . Alcohol use: Yes  Alcohol/week: 0.0 standard drinks  . Drug use: No   Review of Systems Constitutional: Negative for fever. Cardiovascular: Negative for chest pain. Respiratory: Positive for shortness of breath Gastrointestinal: Negative for abdominal pain, vomiting and diarrhea. Musculoskeletal: Negative for back pain.  Positive for edema Skin: Negative for rash. Neurological: Negative for headaches, focal weakness or numbness.  All systems  negative/normal/unremarkable except as stated in the HPI  ____________________________________________   PHYSICAL EXAM:  VITAL SIGNS: ED Triage Vitals [08/22/18 1223]  Enc Vitals Group     BP      Pulse      Resp      Temp      Temp src      SpO2 98 %     Weight      Height      Head Circumference      Peak Flow      Pain Score      Pain Loc      Pain Edu?      Excl. in [place]?    Constitutional: Alert and oriented.  Mild distress Eyes: Conjunctivae are normal. Normal extraocular movements. ENT   Head: Normocephalic and atraumatic.   Nose: No congestion/rhinnorhea.   Mouth/Throat: Mucous membranes are moist.   Neck: No stridor. Cardiovascular: Normal rate, regular rhythm. No murmurs, rubs, or gallops. Respiratory: Tachypnea is noted with rales Gastrointestinal: Soft and nontender. Normal bowel sounds Musculoskeletal: Nontender with normal range of motion in extremities.  Generalized edema is noted Neurologic:  Normal speech and language. No gross focal neurologic deficits are appreciated.  Skin:  Skin is warm, dry and intact. No rash noted. Psychiatric: Mood and affect are normal. Speech and behavior are normal.  ____________________________________________  EKG: Interpreted by me.  Sinus rhythm the rate of 84 bpm, prolonged PR interval, likely old inferior infarct, normal QT  ____________________________________________  ED COURSE:  As part of my medical decision making, I reviewed the following data within the [place] History obtained from family if available, nursing notes, old chart and ekg, as well as notes from prior ED visits. Patient presented for shortness of breath, we will assess with labs and imaging as indicated at this time.   Procedures ____________________________________________   LABS (pertinent positives/negatives)  Labs Reviewed  CBC WITH DIFFERENTIAL/PLATELET - Abnormal; Notable for the following components:       Result Value   RBC 2.93 (*)    Hemoglobin 9.1 (*)    HCT 27.4 (*)    RDW 16.4 (*)    Lymphs Abs 0.5 (*)    All other components within normal limits  BASIC METABOLIC PANEL  BRAIN NATRIURETIC PEPTIDE  TROPONIN I   CRITICAL CARE Performed by: Laurence Aly   Total critical care time: 30 minutes  Critical care time was exclusive of separately billable procedures and treating other patients.  Critical care was necessary to treat or prevent imminent or life-threatening deterioration.  Critical care was time spent personally by me on the following activities: development of treatment plan with patient and/or surrogate as well as nursing, discussions with consultants, evaluation of patient's response to treatment, examination of patient, obtaining history from patient or surrogate, ordering and performing treatments and interventions, ordering and review of laboratory studies, ordering and review of radiographic studies, pulse oximetry and re-evaluation of patient's condition.  RADIOLOGY Images were viewed by me  Chest x-ray IMPRESSION: Increased bilateral lung opacities are noted concerning for pneumonia or edema. Mild left pleural effusion is noted.  Aortic Atherosclerosis (ICD10-I70.0). ____________________________________________  DIFFERENTIAL DIAGNOSIS   CHF, electrolyte abnormality, worsening renal failure, volume overload, anasarca  FINAL ASSESSMENT AND PLAN  Edema, dyspnea, end-stage renal disease   Plan: The patient had presented for shortness of breath. Patient's labs did indicate worsening of his renal failure and were indicative likely of volume overload. Patient's imaging revealed increased bilateral lung opacities likely indicating edema.  I have discussed with nephrology, we will give him a large dose of Lasix here and started on Lasix drip to see if this improves, if not he will start dialysis.   Laurence Aly, MD   Note: This note was  generated in part or whole with voice recognition software. Voice recognition is usually quite accurate but there are transcription errors that can and very often do occur. I apologize for any typographical errors that were not detected and corrected.     Earleen Newport, MD 08/22/18 [phone]

## 2018-08-22 NOTE — ED Notes (Signed)
Pt has BLE pitting edema present and audible wheezing

## 2018-08-22 NOTE — H&P (Signed)
[facility] [place] NAME: Cory Miller    MR#:  [id number]  DATE OF BIRTH:  02/24/1941  DATE OF ADMISSION:  08/22/2018  PRIMARY CARE PHYSICIAN: [name], MD   REQUESTING/REFERRING PHYSICIAN:   CHIEF COMPLAINT:   Chief Complaint  Patient presents with  . Shortness of Breath    HISTORY OF PRESENT ILLNESS: Cory Miller  is a 77 y.o. 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  Patient was short of breath and hypoxic was put on BiPAP in the emergency room for respiratory distress.  He has generalized edema and swelling all over the body with anasarca.  80 mg IV Lasix was given for diuresis in the emergency room and patient started on IV Lasix drip.  Hospitalist service was consulted for further care. BiPAP settings IPAP 12 EPAP 6 Rate 8 FiO2 35%  PAST MEDICAL HISTORY:   Past Medical History:  Diagnosis Date  . Blood dyscrasia    prostate  . CAD (coronary artery disease) of artery bypass graft   . Carotid atherosclerosis   . Carotid stenosis   . Chronic kidney disease   . CKD (chronic kidney disease), stage V ([place])   . CVA (cerebrovascular accident) ([place])   . Diabetes mellitus without complication ([place])   . Dyspnea   . ED (erectile dysfunction)   . Elevated troponin I level   . Esophageal ulcer   . GERD (gastroesophageal reflux disease)   . Gout   . Hard of hearing    Pt very hard  of hearing  . Hyperlipidemia   . Hypertension   . MI (myocardial infarction) ([place])   . Neuropathy   . Occlusion of both internal carotid arteries   . Prostate CA ([place])   . Sleep apnea     PAST SURGICAL HISTORY:  Past Surgical History:  Procedure Laterality Date  . ARTERIAL BYPASS SURGRY    . AV FISTULA PLACEMENT Right 06/14/2018    Procedure: ARTERIOVENOUS (AV) FISTULA CREATION(brachiocephalic);  Surgeon: Katha Cabal, MD;  Location: [facility] ORS;  Service: Vascular;  Laterality: Right;  . CAROTID ENDARTERECTOMY Left   . chest tubes x 2     for pneumothorax  . COLONOSCOPY WITH PROPOFOL N/A 06/02/2016   Procedure: COLONOSCOPY WITH PROPOFOL;  Surgeon: Lollie Sails, MD;  Location: [facility] ENDOSCOPY;  Service: Endoscopy;  Laterality: N/A;  . COLONOSCOPY WITH PROPOFOL N/A 07/28/2016   Procedure: COLONOSCOPY WITH PROPOFOL;  Surgeon: Lollie Sails, MD;  Location: [facility] ENDOSCOPY;  Service: Endoscopy;  Laterality: N/A;  . CORONARY ANGIOPLASTY    . CORONARY ARTERY BYPASS GRAFT    . decortication,parietal pleurectomy Left 05/2002  . ESOPHAGOGASTRODUODENOSCOPY N/A 08/05/2015   Procedure: ESOPHAGOGASTRODUODENOSCOPY (EGD);  Surgeon: Hulen Luster, MD;  Location: [facility] ENDOSCOPY;  Service: Endoscopy;  Laterality: N/A;  . ESOPHAGOGASTRODUODENOSCOPY (EGD) WITH PROPOFOL N/A 09/30/2015   Procedure: ESOPHAGOGASTRODUODENOSCOPY (EGD) WITH PROPOFOL;  Surgeon: Hulen Luster, MD;  Location: [facility] ENDOSCOPY;  Service: Gastroenterology;  Laterality: N/A;  . ostate cryoablation    . partial thoracoscopy  6/03    SOCIAL HISTORY:  Social History   Tobacco Use  . Smoking status: Former Research scientist (life sciences)  . Smokeless tobacco: Never Used  Substance Use Topics  . Alcohol use: Yes    Alcohol/week: 0.0 standard drinks    FAMILY  HISTORY:  Family History  Problem Relation Age of Onset  . CAD Unknown   . Diabetes Unknown   . Cancer Unknown   . Stroke Unknown     DRUG ALLERGIES: No Known Allergies  REVIEW OF SYSTEMS:   CONSTITUTIONAL: No fever, has fatigue and weakness.  EYES: No blurred or double vision.  EARS, NOSE, AND THROAT: No tinnitus or ear pain.  RESPIRATORY: No cough,has shortness of breath,  No wheezing or hemoptysis.  CARDIOVASCULAR: No chest pain, orthopnea, edema.  GASTROINTESTINAL: No nausea, vomiting, diarrhea or abdominal pain.   GENITOURINARY: No dysuria, hematuria.  ENDOCRINE: No polyuria, nocturia,  HEMATOLOGY: No anemia, easy bruising or bleeding SKIN: No rash or lesion. MUSCULOSKELETAL: No joint pain or arthritis.   Has edema NEUROLOGIC: No tingling, numbness, weakness.  PSYCHIATRY: No anxiety or depression.   MEDICATIONS AT HOME:  Prior to Admission medications   Medication Sig Start Date End Date Taking? Authorizing Provider  allopurinol (ZYLOPRIM) 100 MG tablet Take 100 mg by mouth daily.    [provider]  amLODipine (NORVASC) 10 MG tablet Take 10 mg by mouth daily.    [provider]  aspirin 81 MG tablet Take 81 mg by mouth daily.    [provider]  atorvastatin (LIPITOR) 40 MG tablet Take 40 mg by mouth at bedtime.    [provider]  carvedilol (COREG) 12.5 MG tablet Take 12.5 mg by mouth 2 (two) times daily with a meal.  09/25/17   [provider]  cholecalciferol (VITAMIN D) 1000 units tablet Take 1,000 Units by mouth daily.     [provider]  cloNIDine (CATAPRES) 0.3 MG tablet Take 0.3 mg by mouth 3 (three) times daily.     [provider]  clopidogrel (PLAVIX) 75 MG tablet Take 75 mg by mouth daily.    [provider]  [facility] Liver Oil CAPS Take 1 capsule by mouth daily.     [provider]  colchicine 0.6 MG tablet Take 0.6 mg by mouth every other day.     [provider]  docusate sodium (COLACE) 100 MG capsule Take 100 mg by mouth 2 (two) times daily.    [provider]  dorzolamide-timolol (COSOPT) 22.3-6.8 MG/ML ophthalmic solution Place 1 drop into both eyes 2 (two) times daily.  02/28/18   [provider]  Flaxseed, Linseed, (FLAX SEED OIL) 1000 MG CAPS Take 1,300 mg by mouth daily.     [provider]  fluticasone (FLONASE) 50 MCG/ACT nasal spray Place 1 spray into both nostrils daily as needed for allergies.     [provider]  furosemide (LASIX) 40 MG tablet Take  80 mg by mouth 2 (two) times daily.     [provider]  hydrALAZINE (APRESOLINE) 100 MG tablet Take 100 mg by mouth 3 (three) times daily.     [provider]  HYDROcodone-acetaminophen (NORCO) 5-325 MG tablet Take 1-2 tablets by mouth every 6 (six) hours as needed for moderate pain or severe pain. 06/14/18   Schnier, Dolores Lory, MD  hydrOXYzine (ATARAX/VISTARIL) 25 MG tablet Take 25 mg by mouth every 6 (six) hours as needed for itching.     [provider]  insulin aspart (NOVOLOG) 100 UNIT/ML FlexPen Inject 10 Units into the skin 3 (three) times daily with meals.     [provider]  insulin detemir (LEVEMIR) 100 UNIT/ML injection Inject 35 Units into the skin 2 (two) times daily.     [provider]  isosorbide mononitrate (IMDUR) 120 MG 24 hr tablet Take 120 mg by mouth daily.    [provider]  ketoconazole (NIZORAL) 2 % shampoo Apply 1 application topically 2 (two) times a week.  11/30/17   [provider]  loratadine (CLARITIN) 10 MG tablet Take 10 mg by mouth daily.    [provider]  oxyCODONE-acetaminophen (PERCOCET/ROXICET) 5-325 MG per tablet Take 1 tablet by mouth 3 (three) times daily.    [provider]  pantoprazole (PROTONIX) 40 MG tablet Take 1 tablet (40 mg total) by mouth daily. 40 mg BID for 1 month followed by once daily Patient taking differently: Take 40 mg by mouth 2 (two) times daily.  08/06/15   Max Sane, MD  SPS 15 GM/60ML suspension Take 60 mLs by mouth once a week. 06/05/18   [provider]  triamcinolone cream (KENALOG) 0.1 % Apply 1 application topically 2 (two) times daily as needed ([place]).     [provider]      PHYSICAL EXAMINATION:   VITAL SIGNS: Blood pressure (!) 177/63, pulse 100, temperature 97.6 F (36.4 C), temperature source Oral, resp. rate (!) 23, height 6\' 1"  (1.854 m), weight 126 kg, SpO2 100 %.  GENERAL:  77 y.o.-year-old patient lying in the  bed with no acute distress.  EYES: Pupils equal, round, reactive to light and accommodation. No scleral icterus. Extraocular muscles intact.  HEENT: Head atraumatic, normocephalic. Oropharynx and nasopharynx clear.  NECK:  Supple, no jugular venous distention. No thyroid enlargement, no tenderness.  LUNGS: Decreased breath sounds bilaterally,bibasilar crepitations heard. No use of accessory muscles of respiration.  CARDIOVASCULAR: S1, S2 normal. No murmurs, rubs, or gallops.  ABDOMEN: Soft, nontender, nondistended. Bowel sounds present. No organomegaly or mass.  EXTREMITIES: Bilateral pedal edema, no cyanosis, or clubbing.  NEUROLOGIC: Cranial nerves II through XII are intact. Muscle strength 5/5 in all extremities. Sensation intact. Gait not checked.  PSYCHIATRIC: The patient is alert and oriented x 3.  SKIN: No obvious rash, lesion, or ulcer.   LABORATORY PANEL:   CBC Recent Labs  Lab 08/22/18 1256  WBC 6.3  HGB 9.1*  HCT 27.4*  PLT 242  MCV 93.7  MCH 31.2  MCHC 33.3  RDW 16.4*  LYMPHSABS 0.5*  MONOABS 0.6  EOSABS 0.1  BASOSABS 0.1   ------------------------------------------------------------------------------------------------------------------  Chemistries  Recent Labs  Lab 08/22/18 1256  NA 139  K 3.9  CL 109  CO2 19*  GLUCOSE 150*  BUN 106*  CREATININE 5.45*  CALCIUM 8.7*   ------------------------------------------------------------------------------------------------------------------ estimated creatinine clearance is 16 mL/min (A) (by C-G formula based on SCr of 5.45 mg/dL (H)). ------------------------------------------------------------------------------------------------------------------ No results for input(s): TSH, T4TOTAL, T3FREE, THYROIDAB in the last 72 hours.  Invalid input(s): FREET3   Coagulation profile No results for input(s): INR, PROTIME in the last 168  hours. ------------------------------------------------------------------------------------------------------------------- No results for input(s): DDIMER in the last 72 hours. -------------------------------------------------------------------------------------------------------------------  Cardiac Enzymes Recent Labs  Lab 08/22/18 1256  TROPONINI <0.03   ------------------------------------------------------------------------------------------------------------------ Invalid input(s): POCBNP  ---------------------------------------------------------------------------------------------------------------  Urinalysis    Component Value Date/Time   COLORURINE STRAW (A) 04/03/2016 2040   APPEARANCEUR CLEAR (A) 04/03/2016 2040   LABSPEC 1.014 04/03/2016 2040   PHURINE 6.0 04/03/2016 2040   GLUCOSEU >500 (A) 04/03/2016 2040   HGBUR 1+ (A) 04/03/2016 2040   BILIRUBINUR NEGATIVE 04/03/2016 2040   KETONESUR NEGATIVE 04/03/2016 2040   PROTEINUR >500 (A) 04/03/2016 2040   NITRITE NEGATIVE 04/03/2016 2040   LEUKOCYTESUR NEGATIVE 04/03/2016 2040  RADIOLOGY: Dg Chest Port 1 View  Result Date: 08/22/2018 CLINICAL DATA:  Shortness of breath. EXAM: PORTABLE CHEST 1 VIEW COMPARISON:  Radiographs of January 02, 2017. FINDINGS: Stable cardiomegaly. Status post coronary artery bypass graft. Atherosclerosis of thoracic aorta is noted. No pneumothorax is noted. Increased bilateral lung opacities are noted concerning for pneumonia or edema. Hypoinflation of the lungs are noted. Mild left pleural effusion is noted. Bony thorax is unremarkable. IMPRESSION: Increased bilateral lung opacities are noted concerning for pneumonia or edema. Mild left pleural effusion is noted. Aortic Atherosclerosis (ICD10-I70.0). Electronically Signed   By: Marijo Conception, M.D.   On: 08/22/2018 13:35    EKG: Orders placed or performed during the hospital encounter of 08/22/18  . ED EKG  . ED EKG  . EKG 12-Lead  .  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   -Acute hypoxic respiratory failure Continue BiPAP for respiratory distress Intensivist consultation  -Fluid overload and anasarca IV Lasix drip for diuresis Check echocardiogram  Chronic-kidney disease stage V Nephrology consultation  -Type 2 diabetes mellitus Diabetic diet with sliding scale coverage with insulin  -DVT prophylaxis subcu heparin  All the records are reviewed and case discussed with ED provider. Management plans discussed with the patient, family and they are in agreement.  CODE STATUS: Full code Code Status History    Date Active Date Inactive Code Status Order ID Comments User Context   04/04/2016 0119 04/04/2016 1817 Full Code [id number]  Lance Coon, MD Inpatient   08/02/2015 1404 08/06/2015 1727 Full Code [id number]  Bettey Costa, MD Inpatient       TOTAL TIME TAKING CARE OF THIS PATIENT: 53 minutes.    Saundra Shelling M.D on 08/22/2018 at 2:34 PM  Between 7am to 6pm - Pager - [phone]  After 6pm go to [email or web address] - password [email or web address] [facility]  [place] [facility]  Office  [phone]  CC: Primary care physician; [name], MD

## 2018-08-23 ENCOUNTER — Inpatient Hospital Stay
Admit: 2018-08-23 | Discharge: 2018-08-23 | Disposition: A | Discharge status: Home / Self Care | Payer: Medicare PPO | Attending: Internal Medicine | Admitting: Internal Medicine

## 2018-08-23 ENCOUNTER — Inpatient Hospital Stay: Payer: Medicare PPO

## 2018-08-23 DIAGNOSIS — J9601 Acute respiratory failure with hypoxia: Secondary | ICD-10-CM

## 2018-08-23 DIAGNOSIS — L8990 Pressure ulcer of unspecified site, unspecified stage: Secondary | ICD-10-CM

## 2018-08-23 DIAGNOSIS — N179 Acute kidney failure, unspecified: Secondary | ICD-10-CM

## 2018-08-23 DIAGNOSIS — J810 Acute pulmonary edema: Secondary | ICD-10-CM

## 2018-08-23 LAB — CBC
HEMATOCRIT: 27.8 % — AB (ref 40.0–52.0)
Hemoglobin: 9.3 g/dL — ABNORMAL LOW (ref 13.0–18.0)
MCH: 31.1 pg (ref 26.0–34.0)
MCHC: 33.3 g/dL (ref 32.0–36.0)
MCV: 93.3 fL (ref 80.0–100.0)
PLATELETS: 236 10*3/uL (ref 150–440)
RBC: 2.98 MIL/uL — ABNORMAL LOW (ref 4.40–5.90)
RDW: 16.5 % — AB (ref 11.5–14.5)
WBC: 5.5 10*3/uL (ref 3.8–10.6)

## 2018-08-23 LAB — BASIC METABOLIC PANEL
Anion gap: 12 (ref 5–15)
BUN: 113 mg/dL — AB (ref 8–23)
CO2: 21 mmol/L — ABNORMAL LOW (ref 22–32)
CREATININE: 4.95 mg/dL — AB (ref 0.61–1.24)
Calcium: 8.8 mg/dL — ABNORMAL LOW (ref 8.9–10.3)
Chloride: 111 mmol/L (ref 98–111)
GFR calc Af Amer: 12 mL/min — ABNORMAL LOW (ref >60)
GFR calc non Af Amer: 10 mL/min — ABNORMAL LOW (ref >60)
GLUCOSE: 160 mg/dL — AB (ref 70–99)
Potassium: 4 mmol/L (ref 3.5–5.1)
Sodium: 144 mmol/L (ref 135–145)

## 2018-08-23 LAB — GLUCOSE, CAPILLARY
GLUCOSE-CAPILLARY: 207 mg/dL — AB (ref 70–99)
GLUCOSE-CAPILLARY: 171 mg/dL — AB (ref 70–99)
Glucose-Capillary: 159 mg/dL — ABNORMAL HIGH (ref 70–99)
Glucose-Capillary: 206 mg/dL — ABNORMAL HIGH (ref 70–99)

## 2018-08-23 LAB — MAGNESIUM: Magnesium: 1.6 mg/dL — ABNORMAL LOW (ref 1.7–2.4)

## 2018-08-23 LAB — POTASSIUM: Potassium: 4.1 mmol/L (ref 3.5–5.1)

## 2018-08-23 MED ORDER — SIMETHICONE 80 MG PO CHEW
80.0000 mg | CHEWABLE_TABLET | Freq: Four times a day (QID) | ORAL | Status: DC | PRN
  Administered 2018-08-23 – 2018-08-25 (×6): 80 mg via ORAL
  Filled 2018-08-23 (×7): qty 1

## 2018-08-23 MED ORDER — MAGNESIUM SULFATE 2 GM/50ML IV SOLN
2.0000 g | Freq: Once | INTRAVENOUS | Status: AC
  Administered 2018-08-23: 2 g via INTRAVENOUS
  Filled 2018-08-23: qty 50

## 2018-08-23 MED ORDER — TORSEMIDE 20 MG PO TABS
60.0000 mg | ORAL_TABLET | Freq: Every day | ORAL | Status: DC
  Administered 2018-08-24 – 2018-08-27 (×4): 60 mg via ORAL
  Filled 2018-08-23 (×5): qty 3

## 2018-08-23 NOTE — Progress Notes (Signed)
While pt was moving to new bed to be transferred to [facility] pt started gasping for air saying he could not breathe with a large amount of anxiety. Bipap placed on pt. O2 sats 95%. NP at bedside and ordered to keep pt in ICU for tonight and ordered a chest xray. Will continue to monitor.

## 2018-08-23 NOTE — Progress Notes (Signed)
[facility], [place] 08/23/18  Subjective:   Patient is off BiPAP and is currently on nasal cannula.  Urine output greater than 4500 cc.  Serum creatinine has improved slightly but BUN is higher.  He states he is able to breathe much better  Objective:  Vital signs in last 24 hours:  Temp:  [97.6 F (36.4 C)-98.6 F (37 C)] 98.6 F (37 C) (09/27 0840) Pulse Rate:  [75-100] 78 (09/27 0700) Resp:  [18-28] 23 (09/27 0700) BP: (120-183)/(42-68) 139/58 (09/27 0700) SpO2:  [85 %-100 %] 98 % (09/27 0840) FiO2 (%):  [35 %] 35 % (09/27 0200) Weight:  [126 kg] 126 kg (09/26 1238)  Weight change:  Filed Weights   08/22/18 1238  Weight: 126 kg    Intake/Output:    Intake/Output Summary (Last 24 hours) at 08/23/2018 1019 Last data filed at 08/23/2018 1012 Gross per 24 hour  Intake 329.41 ml  Output 4545 ml  Net -4215.59 ml     Physical Exam: General:  No acute distress, laying in the bed  HEENT  oxygen by nasal cannula, moist oral mucous membranes  Neck  supple  Pulm/lungs  mild diffuse basilar crackles  CVS/Heart  regular, no rub  Abdomen:   Soft, obese, distended  Extremities:  SCDs in place 1+ edema bilaterally  Neurologic:  Alert, decreased hearing, able to answer questions appropriately  Skin:  Normal turgor  Access:  Right upper arm AV fistula, developing       Basic Metabolic Panel:  Recent Labs  Lab 08/22/18 1256 08/22/18 2356 08/23/18 0555  NA 139  --  144  K 3.9 4.1 4.0  CL 109  --  111  CO2 19*  --  21*  GLUCOSE 150*  --  160*  BUN 106*  --  113*  CREATININE 5.45*  --  4.95*  CALCIUM 8.7*  --  8.8*  MG 1.7 1.6*  --      CBC: Recent Labs  Lab 08/22/18 1256 08/23/18 0555  WBC 6.3 5.5  NEUTROABS 5.2  --   HGB 9.1* 9.3*  HCT 27.4* 27.8*  MCV 93.7 93.3  PLT 242 236     No results found for: HEPBSAG, HEPBSAB, HEPBIGM    Microbiology:  Recent Results (from the past 240 hour(s))  MRSA PCR Screening     Status:  None   Collection Time: 08/22/18  3:55 PM  Result Value Ref Range Status   MRSA by PCR NEGATIVE NEGATIVE Final    Comment:        The GeneXpert MRSA Assay (FDA approved for NASAL specimens only), is one component of a comprehensive MRSA colonization surveillance program. It is not intended to diagnose MRSA infection nor to guide or monitor treatment for MRSA infections. Performed at [facility], [address] [place], [place] [address]     Coagulation Studies: No results for input(s): LABPROT, INR in the last 72 hours.  Urinalysis: No results for input(s): COLORURINE, LABSPEC, PHURINE, GLUCOSEU, HGBUR, BILIRUBINUR, KETONESUR, PROTEINUR, UROBILINOGEN, NITRITE, LEUKOCYTESUR in the last 72 hours.  Invalid input(s): APPERANCEUR    Imaging: Dg Chest Port 1 View  Result Date: 08/22/2018 CLINICAL DATA:  Shortness of breath. EXAM: PORTABLE CHEST 1 VIEW COMPARISON:  Radiographs of January 02, 2017. FINDINGS: Stable cardiomegaly. Status post coronary artery bypass graft. Atherosclerosis of thoracic aorta is noted. No pneumothorax is noted. Increased bilateral lung opacities are noted concerning for pneumonia or edema. Hypoinflation of the lungs are noted. Mild left  pleural effusion is noted. Bony thorax is unremarkable. IMPRESSION: Increased bilateral lung opacities are noted concerning for pneumonia or edema. Mild left pleural effusion is noted. Aortic Atherosclerosis (ICD10-I70.0). Electronically Signed   By: Marijo Conception, M.D.   On: 08/22/2018 13:35     Medications:   . sodium chloride    . dexmedetomidine (PRECEDEX) IV infusion Stopped (08/23/18 0636)  . furosemide (LASIX) infusion 6 mg/hr (08/23/18 0700)   . allopurinol  100 mg Oral Daily  . amLODipine  10 mg Oral Daily  . aspirin EC  81 mg Oral Daily  . atorvastatin  40 mg Oral QHS  . carvedilol  12.5 mg Oral BID WC  . cholecalciferol  1,000 Units Oral Daily  . cloNIDine  0.3 mg Oral TID  . clopidogrel  75  mg Oral Daily  . docusate sodium  100 mg Oral BID  . dorzolamide-timolol  1 drop Both Eyes BID  . heparin  5,000 Units Subcutaneous Q8H  . hydrALAZINE  100 mg Oral TID  . insulin aspart  0-15 Units Subcutaneous TID WC  . insulin aspart  0-5 Units Subcutaneous QHS  . isosorbide mononitrate  120 mg Oral Daily  . loratadine  10 mg Oral Daily  . pantoprazole  40 mg Oral QHS  . sodium chloride flush  3 mL Intravenous Q12H   sodium chloride, acetaminophen **OR** acetaminophen, fluticasone, HYDROcodone-acetaminophen, hydrOXYzine, ondansetron **OR** ondansetron (ZOFRAN) IV, sodium chloride flush  Assessment/ Plan:  77 y.o.African [place] male with a PMH of OSA, Prostate Cancer, MI, HTN, HLD, GERD, Esophageal Cancer, Diabetes Mellitus, CVA, CKD-Stage V, Carotid Stenosis, and CAD s/p CABG.  1.  Chronic kidney disease stage V 2.  Acute pulmonary edema 3.  Hypertension 4.  Peripheral edema  Patient has advanced chronic kidney disease and is nearing end-stage renal disease.    He has mild uremic symptoms.  He states that he has no appetite at home.  He came in volume overload but has responded well to IV Lasix infusion.  Urine output was greater than 4500 cc last 24 hrs.  May continue IV Lasix for another few hours and then consider changing to torsemide 60 mg starting tomorrow morning   He has developing AV fistula which will require angioplasty prior to use.  At present he seems to have turned the corner.  Electrolytes and volume status are acceptable.  No acute indication for dialysis today but will require it in near future.    LOS: [place] 9/27/201910:19 AM  [place], [place]  Note: This note was prepared with Dragon dictation. Any transcription errors are unintentional

## 2018-08-23 NOTE — Progress Notes (Signed)
*  PRELIMINARY RESULTS* Echocardiogram 2D Echocardiogram has been performed.  Cory Miller 08/23/2018, 2:50 PM

## 2018-08-23 NOTE — Progress Notes (Signed)
[facility] [place] NAME: Fitzhugh Vizcarrondo    MR#:  [id number]  DATE OF BIRTH:  [birth year]  SUBJECTIVE:  CHIEF COMPLAINT:   Chief Complaint  Patient presents with  . Shortness of Breath  no SOB. Tired, lethargic REVIEW OF SYSTEMS:  Review of Systems  Constitutional: Positive for malaise/fatigue. Negative for chills and fever.  HENT: Negative for nosebleeds and sore throat.   Eyes: Negative for blurred vision.  Respiratory: Negative for cough, shortness of breath and wheezing.   Cardiovascular: Negative for chest pain, orthopnea, leg swelling and PND.  Gastrointestinal: Negative for abdominal pain, constipation, diarrhea, heartburn, nausea and vomiting.  Genitourinary: Negative for dysuria and urgency.  Musculoskeletal: Negative for back pain.  Skin: Negative for rash.  Neurological: Negative for dizziness, speech change, focal weakness and headaches.  Endo/Heme/Allergies: Does not bruise/bleed easily.  Psychiatric/Behavioral: Negative for depression.   DRUG ALLERGIES:  No Known Allergies VITALS:  Blood pressure (!) 139/58, pulse 78, temperature 98.6 F (37 C), temperature source Oral, resp. rate (!) 23, height 6\' 1"  (1.854 m), weight 126 kg, SpO2 98 %. PHYSICAL EXAMINATION:  Physical Exam  Constitutional: He is oriented to person, place, and time.  HENT:  Head: Normocephalic and atraumatic.  Eyes: Pupils are equal, round, and reactive to light. Conjunctivae and EOM are normal.  Neck: Normal range of motion. Neck supple. No tracheal deviation present. No thyromegaly present.  Cardiovascular: Normal rate, regular rhythm and normal heart sounds.  Pulmonary/Chest: Effort normal and breath sounds normal. No respiratory distress. He has no wheezes. He exhibits no tenderness.  Abdominal: Soft. Bowel sounds are normal. He exhibits no distension. There is no tenderness.  Musculoskeletal: Normal range of motion.  Neurological: He is  alert and oriented to person, place, and time. No cranial nerve deficit.  Skin: Skin is warm and dry. No rash noted.   LABORATORY PANEL:  Male CBC Recent Labs  Lab 08/23/18 0555  WBC 5.5  HGB 9.3*  HCT 27.8*  PLT 236   ------------------------------------------------------------------------------------------------------------------ Chemistries  Recent Labs  Lab 08/22/18 2356 08/23/18 0555  NA  --  144  K 4.1 4.0  CL  --  111  CO2  --  21*  GLUCOSE  --  160*  BUN  --  113*  CREATININE  --  4.95*  CALCIUM  --  8.8*  MG 1.6*  --    RADIOLOGY:  Dg Chest Port 1 View  Result Date: 08/22/2018 CLINICAL DATA:  Shortness of breath. EXAM: PORTABLE CHEST 1 VIEW COMPARISON:  Radiographs of January 02, 2017. FINDINGS: Stable cardiomegaly. Status post coronary artery bypass graft. Atherosclerosis of thoracic aorta is noted. No pneumothorax is noted. Increased bilateral lung opacities are noted concerning for pneumonia or edema. Hypoinflation of the lungs are noted. Mild left pleural effusion is noted. Bony thorax is unremarkable. IMPRESSION: Increased bilateral lung opacities are noted concerning for pneumonia or edema. Mild left pleural effusion is noted. Aortic Atherosclerosis (ICD10-I70.0). Electronically Signed   By: Marijo Conception, M.D.   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   -Acute hypoxic respiratory failure: due to fluid overload Continue BiPAP for respiratory distress as need per PCCM -Fluid overload and anasarca IV Lasix drip for diuresis pending echocardiogram  - Chronic-kidney  disease stage V nearing end-stage renal disease Nephrology to decide on long term needs for HD  -Type 2 diabetes mellitus Diabetic diet with sliding  scale coverage with insulin  * Anemia of Chronic dz - stable  -DVT prophylaxis subcu heparin     All the records are reviewed and case discussed with Care Management/Social Worker. Management plans discussed with the patient, nursing and they are in agreement.  CODE STATUS: Full Code  TOTAL TIME TAKING CARE OF THIS PATIENT: 35 minutes.   More than 50% of the time was spent in counseling/coordination of care: YES  POSSIBLE D/C IN 3-4 DAYS, DEPENDING ON CLINICAL CONDITION.   Max Sane M.D on 08/23/2018 at 12:56 PM  Between 7am to 6pm - Pager - [phone]  After 6pm go to [email or web address] - Proofreader  [employer] [place] [facility]  Office  [phone]  CC: Primary care physician; [name], MD  Note: This dictation was prepared with Dragon dictation along with smaller phrase technology. Any transcriptional errors that result from this process are unintentional.

## 2018-08-23 NOTE — Progress Notes (Signed)
Name: Cory Miller MRN: [id number] DOB: February 27, 1941    ADMISSION DATE:  08/22/2018 CONSULTATION DATE: 08/22/2018  REFERRING MD : Dr. Estanislado Pandy   CHIEF COMPLAINT: Shortness of Breath   BRIEF PATIENT DESCRIPTION:  77 yo male with Stage V CKD (AVF placed 05/2018 once mature will start HD) admitted with acute respiratory failure fluid overload requiring Bipap and lasix gtt.   SIGNIFICANT EVENTS/STUDIES:  09/26-Pt admitted to stepdown unit on Bipap  09/27-Lasix gtt discontinued  SUBJECTIVE:  No complaints at this time pt states shortness of breath has resolved   VITAL SIGNS: Temp:  [97.6 F (36.4 C)-98.6 F (37 C)] 98.6 F (37 C) (09/27 0840) Pulse Rate:  [75-100] 78 (09/27 0700) Resp:  [18-28] 23 (09/27 0700) BP: (120-183)/(42-68) 139/58 (09/27 0700) SpO2:  [85 %-100 %] 98 % (09/27 0840) FiO2 (%):  [35 %] 35 % (09/27 0200) Weight:  [160 kg] 126 kg (09/26 1238)  PHYSICAL EXAMINATION: General: acutely ill appearing male, NAD on Bipap  Neuro: alert and oriented, follows commands, very HOH  HEENT: supple, JVD present  Cardiovascular: nsr, rrr, no R/G Lungs: faint crackles throughout, even, non labored  Abdomen: +BS x4, obese, soft, non tender, non distended  Musculoskeletal: 2+ bilateral lower extremity edema, normal tone  Skin: intact no rashes or lesions, RUE AVF +bruit and thrill   Recent Labs  Lab 08/22/18 1256 08/22/18 2356 08/23/18 0555  NA 139  --  144  K 3.9 4.1 4.0  CL 109  --  111  CO2 19*  --  21*  BUN 106*  --  113*  CREATININE 5.45*  --  4.95*  GLUCOSE 150*  --  160*   Recent Labs  Lab 08/22/18 1256 08/23/18 0555  HGB 9.1* 9.3*  HCT 27.4* 27.8*  WBC 6.3 5.5  PLT 242 236   Dg Chest Port 1 View  Result Date: 08/22/2018 CLINICAL DATA:  Shortness of breath. EXAM: PORTABLE CHEST 1 VIEW COMPARISON:  Radiographs of January 02, 2017. FINDINGS: Stable cardiomegaly. Status post coronary artery bypass graft. Atherosclerosis of thoracic aorta is noted. No  pneumothorax is noted. Increased bilateral lung opacities are noted concerning for pneumonia or edema. Hypoinflation of the lungs are noted. Mild left pleural effusion is noted. Bony thorax is unremarkable. IMPRESSION: Increased bilateral lung opacities are noted concerning for pneumonia or edema. Mild left pleural effusion is noted. Aortic Atherosclerosis (ICD10-I70.0). Electronically Signed   By: Marijo Conception, M.D.   On: 08/22/2018 13:35    ASSESSMENT / PLAN: Acute respiratory failure secondary to pulmonary edema  Acute on chronic renal failure (followed by outpatient nephrology plans for HD once AVF matured) Hx: OSA  Prn Bipap for dyspnea and/or hypoxia  Continue lasix gtt Trend BMP  Replace electrolytes as indicated  Monitor UOP Avoid nephrotoxic medications  Nephrology consulted appreciate input Repeat CXR in am   HTN Hyperlipidemia  Hx: CVA, CAD s/p CABG Continuous telemetry monitoring  Continue outpatient atorvastatin, aspirin, amlodipine, clonidine, plavix, and imdur Echo pending   Anemia without obvious acute blood loss VTE px: subq heparin  Trend CBC  Monitor for s/sx of bleeding and transfuse for hgb <7  GERD Hx: Esophageal ulcer  SUP px: po protonix   Type II Diabetes Mellitus  CBG's ac/hs SSI   -Continue heart healthy/carb modified diet  -Continue foley catheter for now will need to monitor UOP closely.  Will reassess for discontinuation of foley catheter in 24hrs   -If pt remains off Bipap will transfer to medsurg unit  later today 08/23/18  Marda Stalker, [place] Pager [phone] (please enter 7 digits) [place] Pager [phone] (please enter 7 digits)

## 2018-08-24 ENCOUNTER — Inpatient Hospital Stay: Payer: Medicare PPO

## 2018-08-24 LAB — GLUCOSE, CAPILLARY
GLUCOSE-CAPILLARY: 169 mg/dL — AB (ref 70–99)
GLUCOSE-CAPILLARY: 291 mg/dL — AB (ref 70–99)
GLUCOSE-CAPILLARY: 267 mg/dL — AB (ref 70–99)
Glucose-Capillary: 205 mg/dL — ABNORMAL HIGH (ref 70–99)
Glucose-Capillary: 197 mg/dL — ABNORMAL HIGH (ref 70–99)

## 2018-08-24 LAB — BASIC METABOLIC PANEL
Anion gap: 11 (ref 5–15)
BUN: 106 mg/dL — AB (ref 8–23)
CALCIUM: 8.4 mg/dL — AB (ref 8.9–10.3)
CO2: 22 mmol/L (ref 22–32)
Chloride: 107 mmol/L (ref 98–111)
Creatinine, Ser: 4.81 mg/dL — ABNORMAL HIGH (ref 0.61–1.24)
GFR calc Af Amer: 12 mL/min — ABNORMAL LOW (ref >60)
GFR, EST NON AFRICAN AMERICAN: 11 mL/min — AB (ref >60)
GLUCOSE: 228 mg/dL — AB (ref 70–99)
POTASSIUM: 3.8 mmol/L (ref 3.5–5.1)
SODIUM: 140 mmol/L (ref 135–145)

## 2018-08-24 LAB — CBC WITH DIFFERENTIAL/PLATELET
BASOS ABS: 0 10*3/uL (ref 0–0.1)
BASOS PCT: 1 %
EOS PCT: 1 %
Eosinophils Absolute: 0.1 10*3/uL (ref 0–0.7)
HCT: 27.1 % — ABNORMAL LOW (ref 40.0–52.0)
Hemoglobin: 9.1 g/dL — ABNORMAL LOW (ref 13.0–18.0)
Lymphocytes Relative: 13 %
Lymphs Abs: 0.8 10*3/uL — ABNORMAL LOW (ref 1.0–3.6)
MCH: 31.1 pg (ref 26.0–34.0)
MCHC: 33.6 g/dL (ref 32.0–36.0)
MCV: 92.8 fL (ref 80.0–100.0)
MONO ABS: 0.7 10*3/uL (ref 0.2–1.0)
Monocytes Relative: 11 %
Neutro Abs: 4.6 10*3/uL (ref 1.4–6.5)
Neutrophils Relative %: 74 %
Platelets: 245 10*3/uL (ref 150–440)
RBC: 2.92 MIL/uL — AB (ref 4.40–5.90)
RDW: 16 % — AB (ref 11.5–14.5)
WBC: 6.2 10*3/uL (ref 3.8–10.6)

## 2018-08-24 LAB — ECHOCARDIOGRAM COMPLETE
HEIGHTINCHES: 73 in
Weight: 4444.47 oz

## 2018-08-24 LAB — PHOSPHORUS: PHOSPHORUS: 5 mg/dL — AB (ref 2.5–4.6)

## 2018-08-24 LAB — MAGNESIUM: MAGNESIUM: 2 mg/dL (ref 1.7–2.4)

## 2018-08-24 MED ORDER — SENNA 8.6 MG PO TABS
2.0000 | ORAL_TABLET | Freq: Once | ORAL | Status: AC
  Administered 2018-08-24: 17.2 mg via ORAL
  Filled 2018-08-24: qty 2

## 2018-08-24 MED ORDER — MORPHINE SULFATE (PF) 2 MG/ML IV SOLN
2.0000 mg | Freq: Once | INTRAVENOUS | Status: AC
  Administered 2018-08-24: 2 mg via INTRAVENOUS
  Filled 2018-08-24: qty 1

## 2018-08-24 MED ORDER — ENALAPRIL MALEATE 2.5 MG PO TABS
2.5000 mg | ORAL_TABLET | Freq: Every day | ORAL | Status: DC
  Administered 2018-08-24 – 2018-08-27 (×4): 2.5 mg via ORAL
  Filled 2018-08-24 (×4): qty 1

## 2018-08-24 MED ORDER — ORAL CARE MOUTH RINSE
15.0000 mL | Freq: Two times a day (BID) | OROMUCOSAL | Status: DC
  Administered 2018-08-24 – 2018-08-27 (×6): 15 mL via OROMUCOSAL

## 2018-08-24 NOTE — Progress Notes (Signed)
[facility], [place] 08/24/18  Subjective:   Patient is off BiPAP and is currently on nasal cannula.  Urine output 3000 cc.  Net negative by 7 liters so far. Serum creatinine and BUN have improved slightly.  He states he is able to breathe much better and ate breakfast this morining  Objective:  Vital signs in last 24 hours:  Temp:  [97.9 F (36.6 C)-98.6 F (37 C)] 98.3 F (36.8 C) (09/28 0800) Pulse Rate:  [78-92] 87 (09/28 0800) Resp:  [19-28] 21 (09/28 0800) BP: (119-157)/(41-105) 145/56 (09/28 0939) SpO2:  [95 %-100 %] 98 % (09/28 0800) FiO2 (%):  [35 %] 35 % (09/28 0400)  Weight change:  Filed Weights   08/22/18 1238  Weight: 126 kg    Intake/Output:    Intake/Output Summary (Last 24 hours) at 08/24/2018 0953 Last data filed at 08/24/2018 0942 Gross per 24 hour  Intake 384.01 ml  Output 3285 ml  Net -2900.99 ml     Physical Exam: General:  No acute distress, laying in the bed  HEENT   moist oral mucous membranes  Neck  supple  Pulm/lungs  mild basilar crackles, oxygen by nasal cannula,  CVS/Heart  regular, no rub  Abdomen:   Soft, obese, distended  Extremities:  SCDs in place 1+ edema bilaterally  Neurologic:  Alert, decreased hearing, able to answer questions appropriately  Skin:  Normal turgor  Access:  Right upper arm AV fistula, developing       Basic Metabolic Panel:  Recent Labs  Lab 08/22/18 1256 08/22/18 2356 08/23/18 0555 08/24/18 0327  NA 139  --  144 140  K 3.9 4.1 4.0 3.8  CL 109  --  111 107  CO2 19*  --  21* 22  GLUCOSE 150*  --  160* 228*  BUN 106*  --  113* 106*  CREATININE 5.45*  --  4.95* 4.81*  CALCIUM 8.7*  --  8.8* 8.4*  MG 1.7 1.6*  --  2.0  PHOS  --   --   --  5.0*     CBC: Recent Labs  Lab 08/22/18 1256 08/23/18 0555 08/24/18 0327  WBC 6.3 5.5 6.2  NEUTROABS 5.2  --  4.6  HGB 9.1* 9.3* 9.1*  HCT 27.4* 27.8* 27.1*  MCV 93.7 93.3 92.8  PLT 242 236 245     No results found for:  HEPBSAG, HEPBSAB, HEPBIGM    Microbiology:  Recent Results (from the past 240 hour(s))  MRSA PCR Screening     Status: None   Collection Time: 08/22/18  3:55 PM  Result Value Ref Range Status   MRSA by PCR NEGATIVE NEGATIVE Final    Comment:        The GeneXpert MRSA Assay (FDA approved for NASAL specimens only), is one component of a comprehensive MRSA colonization surveillance program. It is not intended to diagnose MRSA infection nor to guide or monitor treatment for MRSA infections. Performed at [facility], [address] [place], [place] [address]     Coagulation Studies: No results for input(s): LABPROT, INR in the last 72 hours.  Urinalysis: No results for input(s): COLORURINE, LABSPEC, PHURINE, GLUCOSEU, HGBUR, BILIRUBINUR, KETONESUR, PROTEINUR, UROBILINOGEN, NITRITE, LEUKOCYTESUR in the last 72 hours.  Invalid input(s): APPERANCEUR    Imaging: Dg Chest Port 1 View  Result Date: 08/24/2018 CLINICAL DATA:  Pulmonary edema. EXAM: PORTABLE CHEST 1 VIEW COMPARISON:  08/23/2018. FINDINGS: Trachea is midline. Heart is enlarged, stable. Thoracic aorta is calcified.  Patchy bilateral airspace opacification, similar to yesterday's exam. No definite pleural fluid. There may be pleural calcifications at the base of the right hemithorax. IMPRESSION: 1. Patchy bilateral airspace opacification is unchanged and may be due to edema. Pneumonia is difficult to exclude. 2.  Aortic atherosclerosis (ICD10-170.0). Electronically Signed   By: Lorin Picket M.D.   On: 08/24/2018 08:31   Dg Chest Port 1 View  Result Date: 08/23/2018 CLINICAL DATA:  Acute respiratory failure EXAM: PORTABLE CHEST 1 VIEW COMPARISON:  08/22/2018 FINDINGS: Bilateral interstitial and alveolar airspace opacities. No pleural effusion or pneumothorax. Stable cardiomegaly. Prior CABG. No acute osseous abnormality. IMPRESSION: 1. Cardiomegaly and bilateral interstitial and alveolar airspace opacities.  Differential considerations include pulmonary edema versus multilobar pneumonia. Electronically Signed   By: Kathreen Devoid   On: 08/23/2018 17:54   Dg Chest Port 1 View  Result Date: 08/22/2018 CLINICAL DATA:  Shortness of breath. EXAM: PORTABLE CHEST 1 VIEW COMPARISON:  Radiographs of January 02, 2017. FINDINGS: Stable cardiomegaly. Status post coronary artery bypass graft. Atherosclerosis of thoracic aorta is noted. No pneumothorax is noted. Increased bilateral lung opacities are noted concerning for pneumonia or edema. Hypoinflation of the lungs are noted. Mild left pleural effusion is noted. Bony thorax is unremarkable. IMPRESSION: Increased bilateral lung opacities are noted concerning for pneumonia or edema. Mild left pleural effusion is noted. Aortic Atherosclerosis (ICD10-I70.0). Electronically Signed   By: Marijo Conception, M.D.   On: 08/22/2018 13:35     Medications:   . sodium chloride     . allopurinol  100 mg Oral Daily  . amLODipine  10 mg Oral Daily  . aspirin EC  81 mg Oral Daily  . atorvastatin  40 mg Oral QHS  . carvedilol  12.5 mg Oral BID WC  . cholecalciferol  1,000 Units Oral Daily  . cloNIDine  0.3 mg Oral TID  . clopidogrel  75 mg Oral Daily  . docusate sodium  100 mg Oral BID  . dorzolamide-timolol  1 drop Both Eyes BID  . heparin  5,000 Units Subcutaneous Q8H  . hydrALAZINE  100 mg Oral TID  . insulin aspart  0-15 Units Subcutaneous TID WC  . insulin aspart  0-5 Units Subcutaneous QHS  . isosorbide mononitrate  120 mg Oral Daily  . pantoprazole  40 mg Oral QHS  . sodium chloride flush  3 mL Intravenous Q12H  . torsemide  60 mg Oral Daily   sodium chloride, acetaminophen **OR** acetaminophen, fluticasone, HYDROcodone-acetaminophen, hydrOXYzine, ondansetron **OR** ondansetron (ZOFRAN) IV, simethicone, sodium chloride flush  Assessment/ Plan:  77 y.o.African [place] male with a PMH of OSA, Prostate Cancer, MI, HTN, HLD, GERD, Esophageal Cancer, Diabetes  Mellitus, CVA, CKD-Stage V, Carotid Stenosis, and CAD s/p CABG.  1.  Chronic kidney disease stage V 2.  Acute pulmonary edema 3.  Hypertension 4.  Peripheral edema  Patient has advanced chronic kidney disease and is nearing end-stage renal disease.    He has mild uremic symptoms.  Overall clinically he has improved. Started on oral torsemide. Will monitor how he does.  He has developing AV fistula which will require angioplasty prior to use.  At present he seems to have turned the corner.  Electrolytes and volume status are acceptable.  No acute indication for dialysis today.  Patient states he drinks a lot of water due to dry mouth. High dose of clonidine is probably playing a role. Can consider decreasing the dose. Will add low dose ACE-i    LOS: 2 Gwendolen Hewlett  Candiss Norse 9/28/20199:53 AM  [place], [place]  Note: This note was prepared with Dragon dictation. Any transcription errors are unintentional

## 2018-08-24 NOTE — Progress Notes (Signed)
Patient complained of gas pain unrelieved by gas medication. Patient has not had BM last two days. ICU MD notified. Received new orders (See MAR). Gave patient two cups of prune juice. Patient so far has only passed gas, no BM yet.

## 2018-08-24 NOTE — Progress Notes (Signed)
Name: Cory Miller MRN: [id number] DOB: [birth year]    ADMISSION DATE:  08/22/2018 CONSULTATION DATE: 08/22/2018  REFERRING MD : Dr. Estanislado Pandy   CHIEF COMPLAINT: Shortness of Breath   BRIEF PATIENT DESCRIPTION:  77 yo male with Stage V CKD (AVF placed 05/2018 once mature will start HD) admitted with acute respiratory failure fluid overload requiring Bipap and lasix gtt.   SIGNIFICANT EVENTS/STUDIES:  09/26-Pt admitted to stepdown unit on Bipap  09/27-Lasix gtt discontinued  SUBJECTIVE:  Events of yesterday afternoon noted.  Presently very comfortable on [place] O2 with no new complaints.  Denies pain and shortness of breath  VITAL SIGNS: Temp:  [97.9 F (36.6 C)-98.6 F (37 C)] 98.3 F (36.8 C) (09/28 0800) Pulse Rate:  [78-92] 83 (09/28 1200) Resp:  [19-26] 22 (09/28 1200) BP: (119-157)/(40-62) 152/48 (09/28 1211) SpO2:  [95 %-100 %] 97 % (09/28 1200) FiO2 (%):  [35 %] 35 % (09/28 0400)  PHYSICAL EXAMINATION: General: NAD on [place] O2 Neuro: Cognition intact, no focal deficits HEENT: NCAT, sclerae white Cardiovascular: Regular, no M Lungs: Faint bibasilar crackles, no wheezes Abdomen: Obese, soft, NABS Extremities: 1+ symmetric ankle and pedal edema  Recent Labs  Lab 08/22/18 1256 08/22/18 2356 08/23/18 0555 08/24/18 0327  NA 139  --  144 140  K 3.9 4.1 4.0 3.8  CL 109  --  111 107  CO2 19*  --  21* 22  BUN 106*  --  113* 106*  CREATININE 5.45*  --  4.95* 4.81*  GLUCOSE 150*  --  160* 228*   Recent Labs  Lab 08/22/18 1256 08/23/18 0555 08/24/18 0327  HGB 9.1* 9.3* 9.1*  HCT 27.4* 27.8* 27.1*  WBC 6.3 5.5 6.2  PLT 242 236 245   Dg Chest Port 1 View  Result Date: 08/24/2018 CLINICAL DATA:  Pulmonary edema. EXAM: PORTABLE CHEST 1 VIEW COMPARISON:  08/23/2018. FINDINGS: Trachea is midline. Heart is enlarged, stable. Thoracic aorta is calcified. Patchy bilateral airspace opacification, similar to yesterday's exam. No definite pleural fluid. There may be pleural  calcifications at the base of the right hemithorax. IMPRESSION: 1. Patchy bilateral airspace opacification is unchanged and may be due to edema. Pneumonia is difficult to exclude. 2.  Aortic atherosclerosis (ICD10-170.0). Electronically Signed   By: Lorin Picket M.D.   On: 08/24/2018 08:31   Dg Chest Port 1 View  Result Date: 08/23/2018 CLINICAL DATA:  Acute respiratory failure EXAM: PORTABLE CHEST 1 VIEW COMPARISON:  08/22/2018 FINDINGS: Bilateral interstitial and alveolar airspace opacities. No pleural effusion or pneumothorax. Stable cardiomegaly. Prior CABG. No acute osseous abnormality. IMPRESSION: 1. Cardiomegaly and bilateral interstitial and alveolar airspace opacities. Differential considerations include pulmonary edema versus multilobar pneumonia. Electronically Signed   By: Kathreen Devoid   On: 08/23/2018 17:54   Dg Chest Port 1 View  Result Date: 08/22/2018 CLINICAL DATA:  Shortness of breath. EXAM: PORTABLE CHEST 1 VIEW COMPARISON:  Radiographs of January 02, 2017. FINDINGS: Stable cardiomegaly. Status post coronary artery bypass graft. Atherosclerosis of thoracic aorta is noted. No pneumothorax is noted. Increased bilateral lung opacities are noted concerning for pneumonia or edema. Hypoinflation of the lungs are noted. Mild left pleural effusion is noted. Bony thorax is unremarkable. IMPRESSION: Increased bilateral lung opacities are noted concerning for pneumonia or edema. Mild left pleural effusion is noted. Aortic Atherosclerosis (ICD10-I70.0). Electronically Signed   By: Marijo Conception, M.D.   On: 08/22/2018 13:35    NOTE: CXR continues to demonstrate cardiomegaly, vascular congestion and mild bony edema.  Overall it is improved since film 9/26 and unchanged compared to film 9/27  ASSESSMENT / PLAN: Acute hypoxemic respiratory failure to to pulmonary edema  OSA Acute on chronic renal failure  Anemia of chronic illness Type II DM   Transfer to MedSurg floor with cardiac  monitoring Continue supplemental oxygen Continue daily torsemide Monitor BMET intermittently Monitor I/Os Correct electrolytes as indicated  Nephrology service following Continue ACHS SSI After transfer, PCCM will sign off. Please call if we can be of further assistance    Merton Border, MD PCCM service Mobile [phone] Pager [phone] 08/24/2018 12:56 PM

## 2018-08-24 NOTE — Progress Notes (Signed)
Cory Miller at [place] NAME: Cory Miller    MR#:  [id number]  DATE OF BIRTH:  09/15/1941  SUBJECTIVE:  CHIEF COMPLAINT:   Chief Complaint  Patient presents with  . Shortness of Breath  no SOB. Tired, awake,.  REVIEW OF SYSTEMS:  Review of Systems  Constitutional: Positive for malaise/fatigue. Negative for chills and fever.  HENT: Negative for nosebleeds and sore throat.   Eyes: Negative for blurred vision.  Respiratory: Negative for cough, shortness of breath and wheezing.   Cardiovascular: Negative for chest pain, orthopnea, leg swelling and PND.  Gastrointestinal: Negative for abdominal pain, constipation, diarrhea, heartburn, nausea and vomiting.  Genitourinary: Negative for dysuria and urgency.  Musculoskeletal: Negative for back pain.  Skin: Negative for rash.  Neurological: Negative for dizziness, speech change, focal weakness and headaches.  Endo/Heme/Allergies: Does not bruise/bleed easily.  Psychiatric/Behavioral: Negative for depression.   DRUG ALLERGIES:  No Known Allergies VITALS:  Blood pressure (!) 140/49, pulse 75, temperature 98.3 F (36.8 C), temperature source Axillary, resp. rate 18, height 6\' 1"  (1.854 m), weight 126 kg, SpO2 97 %. PHYSICAL EXAMINATION:  Physical Exam  Constitutional: He is oriented to person, place, and time.  HENT:  Head: Normocephalic and atraumatic.  Eyes: Pupils are equal, round, and reactive to light. Conjunctivae and EOM are normal.  Neck: Normal range of motion. Neck supple. No tracheal deviation present. No thyromegaly present.  Cardiovascular: Normal rate, regular rhythm and normal heart sounds.  Pulmonary/Chest: Effort normal and breath sounds normal. No respiratory distress. He has no wheezes. He exhibits no tenderness.  Abdominal: Soft. Bowel sounds are normal. He exhibits no distension. There is no tenderness.  Musculoskeletal: Normal range of motion.  Neurological: He is  alert and oriented to person, place, and time. No cranial nerve deficit.  Skin: Skin is warm and dry. No rash noted.   LABORATORY PANEL:  Male CBC Recent Labs  Lab 08/24/18 0327  WBC 6.2  HGB 9.1*  HCT 27.1*  PLT 245   ------------------------------------------------------------------------------------------------------------------ Chemistries  Recent Labs  Lab 08/24/18 0327  NA 140  K 3.8  CL 107  CO2 22  GLUCOSE 228*  BUN 106*  CREATININE 4.81*  CALCIUM 8.4*  MG 2.0   RADIOLOGY:  Dg Chest Port 1 View  Result Date: 08/24/2018 CLINICAL DATA:  Pulmonary edema. EXAM: PORTABLE CHEST 1 VIEW COMPARISON:  08/23/2018. FINDINGS: Trachea is midline. Heart is enlarged, stable. Thoracic aorta is calcified. Patchy bilateral airspace opacification, similar to yesterday's exam. No definite pleural fluid. There may be pleural calcifications at the base of the right hemithorax. IMPRESSION: 1. Patchy bilateral airspace opacification is unchanged and may be due to edema. Pneumonia is difficult to exclude. 2.  Aortic atherosclerosis (ICD10-170.0). Electronically Signed   By: Cory Miller M.D.   On: 08/24/2018 08:31   Dg Chest Port 1 View  Result Date: 08/23/2018 CLINICAL DATA:  Acute respiratory failure EXAM: PORTABLE CHEST 1 VIEW COMPARISON:  08/22/2018 FINDINGS: Bilateral interstitial and alveolar airspace opacities. No pleural effusion or pneumothorax. Stable cardiomegaly. Prior CABG. No acute osseous abnormality. IMPRESSION: 1. Cardiomegaly and bilateral interstitial and alveolar airspace opacities. Differential considerations include pulmonary edema versus multilobar pneumonia. Electronically Signed   By: Cory Miller   On: 08/23/2018 17:54   ASSESSMENT AND PLAN:  77 year old male patient with a known history of coronary artery disease, chronic kidney disease stage V not on dialysis yet, CVA, diabetes mellitus type 2, esophageal ulcer, GERD, gout, hyperlipidemia admitted  for difficulty  breathing.   -Acute hypoxic respiratory failure: due to fluid overload Continue BiPAP for respiratory distress as need per PCCM- weaned to nasal canula now,. -Fluid overload and anasarca IV Lasix drip for diuresis Reviewed echocardiogram- EF of 55%  - Chronic-kidney disease stage V nearing end-stage renal disease Nephrology to decide on long term needs for HD  -Type 2 diabetes mellitus Diabetic diet with sliding scale coverage with insulin  * Anemia of Chronic dz - stable  -DVT prophylaxis subcu heparin  - HTn- Cont amlodipine, Coreg, clonidine, hydralazine, isosorbide and torsemide.   All the records are reviewed and case discussed with Care Management/Social Worker. Management plans discussed with the patient, nursing and they are in agreement.  CODE STATUS: Full Code  TOTAL TIME TAKING CARE OF THIS PATIENT: 35 minutes.   More than 50% of the time was spent in counseling/coordination of care: YES  POSSIBLE D/C IN 3-4 DAYS, DEPENDING ON CLINICAL CONDITION.   Cory Miller M.D on 08/24/2018 at 4:55 PM  Between 7am to 6pm - Pager - [phone]  After 6pm go to [email or web address] - Proofreader  [facility]  Office  [phone]  CC: Primary care physician; Cory Cables, MD  Note: This dictation was prepared with Dragon dictation along with smaller phrase technology. Any transcriptional errors that result from this process are unintentional.

## 2018-08-25 LAB — BASIC METABOLIC PANEL WITH GFR
Anion gap: 9 (ref 5–15)
BUN: 107 mg/dL — ABNORMAL HIGH (ref 8–23)
CO2: 23 mmol/L (ref 22–32)
Calcium: 8.4 mg/dL — ABNORMAL LOW (ref 8.9–10.3)
Chloride: 110 mmol/L (ref 98–111)
Creatinine, Ser: 4.41 mg/dL — ABNORMAL HIGH (ref 0.61–1.24)
GFR calc Af Amer: 14 mL/min — ABNORMAL LOW (ref >60)
GFR calc non Af Amer: 12 mL/min — ABNORMAL LOW (ref >60)
Glucose, Bld: 207 mg/dL — ABNORMAL HIGH (ref 70–99)
Potassium: 4.3 mmol/L (ref 3.5–5.1)
Sodium: 142 mmol/L (ref 135–145)

## 2018-08-25 LAB — GLUCOSE, CAPILLARY
GLUCOSE-CAPILLARY: 242 mg/dL — AB (ref 70–99)
GLUCOSE-CAPILLARY: 185 mg/dL — AB (ref 70–99)
Glucose-Capillary: 169 mg/dL — ABNORMAL HIGH (ref 70–99)
Glucose-Capillary: 199 mg/dL — ABNORMAL HIGH (ref 70–99)
Glucose-Capillary: 280 mg/dL — ABNORMAL HIGH (ref 70–99)
Glucose-Capillary: 234 mg/dL — ABNORMAL HIGH (ref 70–99)

## 2018-08-25 MED ORDER — CLONIDINE HCL 0.1 MG PO TABS
0.1000 mg | ORAL_TABLET | Freq: Three times a day (TID) | ORAL | Status: DC
  Administered 2018-08-25 – 2018-08-27 (×7): 0.1 mg via ORAL
  Filled 2018-08-25 (×7): qty 1

## 2018-08-25 MED ORDER — SIMETHICONE 80 MG PO CHEW
160.0000 mg | CHEWABLE_TABLET | Freq: Four times a day (QID) | ORAL | Status: DC | PRN
  Administered 2018-08-26 – 2018-08-27 (×2): 160 mg via ORAL
  Filled 2018-08-25 (×4): qty 2

## 2018-08-25 MED ORDER — INFLUENZA VAC SPLIT HIGH-DOSE 0.5 ML IM SUSY
0.5000 mL | PREFILLED_SYRINGE | INTRAMUSCULAR | Status: AC
  Administered 2018-08-26: 0.5 mL via INTRAMUSCULAR
  Filled 2018-08-25: qty 0.5

## 2018-08-25 MED ORDER — PNEUMOCOCCAL VAC POLYVALENT 25 MCG/0.5ML IJ INJ
0.5000 mL | INJECTION | INTRAMUSCULAR | Status: AC
  Administered 2018-08-26: 0.5 mL via INTRAMUSCULAR
  Filled 2018-08-25 (×2): qty 0.5

## 2018-08-25 NOTE — Progress Notes (Signed)
[facility] [place] NAME: Cory Miller    MR#:  [id number]  DATE OF BIRTH:  July 28, 1941  SUBJECTIVE:  CHIEF COMPLAINT:   Chief Complaint  Patient presents with  . Shortness of Breath  no SOB. Tired, awake,.  REVIEW OF SYSTEMS:  Review of Systems  Constitutional: Positive for malaise/fatigue. Negative for chills and fever.  HENT: Negative for nosebleeds and sore throat.   Eyes: Negative for blurred vision.  Respiratory: Negative for cough, shortness of breath and wheezing.   Cardiovascular: Negative for chest pain, orthopnea, leg swelling and PND.  Gastrointestinal: Negative for abdominal pain, constipation, diarrhea, heartburn, nausea and vomiting.  Genitourinary: Negative for dysuria and urgency.  Musculoskeletal: Negative for back pain.  Skin: Negative for rash.  Neurological: Negative for dizziness, speech change, focal weakness and headaches.  Endo/Heme/Allergies: Does not bruise/bleed easily.  Psychiatric/Behavioral: Negative for depression.   DRUG ALLERGIES:  No Known Allergies VITALS:  Blood pressure (!) 179/54, pulse 88, temperature 98.5 F (36.9 C), temperature source Oral, resp. rate (!) 23, height 6\' 1"  (1.854 m), weight 126 kg, SpO2 96 %. PHYSICAL EXAMINATION:  Physical Exam  Constitutional: He is oriented to person, place, and time.  HENT:  Head: Normocephalic and atraumatic.  Eyes: Pupils are equal, round, and reactive to light. Conjunctivae and EOM are normal.  Neck: Normal range of motion. Neck supple. No tracheal deviation present. No thyromegaly present.  Cardiovascular: Normal rate, regular rhythm and normal heart sounds.  Pulmonary/Chest: Effort normal and breath sounds normal. No respiratory distress. He has no wheezes. He exhibits no tenderness.  Abdominal: Soft. Bowel sounds are normal. He exhibits no distension. There is no tenderness.  Musculoskeletal: Normal range of motion.  Neurological: He is  alert and oriented to person, place, and time. No cranial nerve deficit.  Skin: Skin is warm and dry. No rash noted.   LABORATORY PANEL:  Male CBC Recent Labs  Lab 08/24/18 0327  WBC 6.2  HGB 9.1*  HCT 27.1*  PLT 245   ------------------------------------------------------------------------------------------------------------------ Chemistries  Recent Labs  Lab 08/24/18 0327 08/25/18 0327  NA 140 142  K 3.8 4.3  CL 107 110  CO2 22 23  GLUCOSE 228* 207*  BUN 106* 107*  CREATININE 4.81* 4.41*  CALCIUM 8.4* 8.4*  MG 2.0  --    RADIOLOGY:  No results found. 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   -Acute hypoxic respiratory failure: due to fluid overload Continue BiPAP for respiratory distress as need per PCCM- weaned to nasal canula now,. -Fluid overload and anasarca IV Lasix drip for diuresis-had good diuresis and improved now. Reviewed echocardiogram- EF of 55% -Try to taper oxygen off and ambulate the patient.  - Chronic-kidney disease stage V nearing end-stage renal disease Nephrology to decide on long term needs for HD As per nephrologist patient already have AV fistula placed but right now there is no need for hemodialysis and he can be discharged home.  -Type 2 diabetes mellitus Diabetic diet with sliding scale coverage with insulin  * Anemia of Chronic dz - stable  -DVT prophylaxis subcu heparin  - HTn- Cont amlodipine, Coreg, clonidine, hydralazine, isosorbide and torsemide. Patient has polydipsia and as per nephrologist this could be due to clonidine high-dose so will cut down the dose.  All  the records are reviewed and case discussed with Care Management/Social Worker. Management plans discussed with the patient, nursing and they  are in agreement.  CODE STATUS: Full Code  TOTAL TIME TAKING CARE OF THIS PATIENT: 35 minutes.   More than 50% of the time was spent in counseling/coordination of care: YES  POSSIBLE D/C IN 1 DAYS, DEPENDING ON CLINICAL CONDITION. Need to have physical therapy evaluation and tapering off the oxygen requirement before discharge.  Vaughan Basta M.D on 08/25/2018 at 3:19 PM  Between 7am to 6pm - Pager - [phone]  After 6pm go to [email or web address] - Proofreader  [facility]  Office  [phone]  CC: Primary care physician; [name], MD  Note: This dictation was prepared with Dragon dictation along with smaller phrase technology. Any transcriptional errors that result from this process are unintentional.

## 2018-08-25 NOTE — Progress Notes (Signed)
Patient is floor care status.  Not officially seen by PCCM.  Please call if we can be of further assistance.  Merton Border, MD PCCM service Mobile [phone] Pager [phone] 08/25/2018 2:42 PM

## 2018-08-25 NOTE — Progress Notes (Signed)
Pt being transferred to room 208. Report called to Maudie Mercury, Therapist, sports. Pt transferred to room 208 without incident.

## 2018-08-25 NOTE — Progress Notes (Signed)
[facility], [place] 08/25/18  Subjective:   Patient is off BiPAP and is currently on nasal cannula.  Urine output 1800 cc.  Net negative by 8.5 liters so far. Serum creatinine and BUN have improved slightly.  He states he is able to breathe much better and ate breakfast this morning.  Objective:  Vital signs in last 24 hours:  Temp:  [98.3 F (36.8 C)-98.4 F (36.9 C)] 98.4 F (36.9 C) (09/29 0800) Pulse Rate:  [66-83] 71 (09/29 0800) Resp:  [18-27] 19 (09/29 0800) BP: (113-152)/(40-50) 133/44 (09/29 0800) SpO2:  [88 %-99 %] 93 % (09/29 0800) FiO2 (%):  [28 %] 28 % (09/29 0400)  Weight change:  Filed Weights   08/22/18 1238  Weight: 126 kg    Intake/Output:    Intake/Output Summary (Last 24 hours) at 08/25/2018 0949 Last data filed at 08/25/2018 0500 Gross per 24 hour  Intake 120 ml  Output 1605 ml  Net -1485 ml     Physical Exam: General:  No acute distress, laying in the bed  HEENT   moist oral mucous membranes  Neck  supple  Pulm/lungs  clear b/l,  oxygen by nasal cannula,  CVS/Heart  regular, no rub  Abdomen:   Soft, obese, distended  Extremities:  SCDs in place , trace edema bilaterally  Neurologic:  Alert, decreased hearing, able to answer questions appropriately  Skin:  Normal turgor  Access:  Right upper arm AV fistula, developing       Basic Metabolic Panel:  Recent Labs  Lab 08/22/18 1256 08/22/18 2356 08/23/18 0555 08/24/18 0327 08/25/18 0327  NA 139  --  144 140 142  K 3.9 4.1 4.0 3.8 4.3  CL 109  --  111 107 110  CO2 19*  --  21* 22 23  GLUCOSE 150*  --  160* 228* 207*  BUN 106*  --  113* 106* 107*  CREATININE 5.45*  --  4.95* 4.81* 4.41*  CALCIUM 8.7*  --  8.8* 8.4* 8.4*  MG 1.7 1.6*  --  2.0  --   PHOS  --   --   --  5.0*  --      CBC: Recent Labs  Lab 08/22/18 1256 08/23/18 0555 08/24/18 0327  WBC 6.3 5.5 6.2  NEUTROABS 5.2  --  4.6  HGB 9.1* 9.3* 9.1*  HCT 27.4* 27.8* 27.1*  MCV 93.7 93.3  92.8  PLT 242 236 245     No results found for: HEPBSAG, HEPBSAB, HEPBIGM    Microbiology:  Recent Results (from the past 240 hour(s))  MRSA PCR Screening     Status: None   Collection Time: 08/22/18  3:55 PM  Result Value Ref Range Status   MRSA by PCR NEGATIVE NEGATIVE Final    Comment:        The GeneXpert MRSA Assay (FDA approved for NASAL specimens only), is one component of a comprehensive MRSA colonization surveillance program. It is not intended to diagnose MRSA infection nor to guide or monitor treatment for MRSA infections. Performed at [facility], [address] [place], [place] [address]     Coagulation Studies: No results for input(s): LABPROT, INR in the last 72 hours.  Urinalysis: No results for input(s): COLORURINE, LABSPEC, PHURINE, GLUCOSEU, HGBUR, BILIRUBINUR, KETONESUR, PROTEINUR, UROBILINOGEN, NITRITE, LEUKOCYTESUR in the last 72 hours.  Invalid input(s): APPERANCEUR    Imaging: Dg Chest Port 1 View  Result Date: 08/24/2018 CLINICAL DATA:  Pulmonary edema. EXAM: PORTABLE CHEST  1 VIEW COMPARISON:  08/23/2018. FINDINGS: Trachea is midline. Heart is enlarged, stable. Thoracic aorta is calcified. Patchy bilateral airspace opacification, similar to yesterday's exam. No definite pleural fluid. There may be pleural calcifications at the base of the right hemithorax. IMPRESSION: 1. Patchy bilateral airspace opacification is unchanged and may be due to edema. Pneumonia is difficult to exclude. 2.  Aortic atherosclerosis (ICD10-170.0). Electronically Signed   By: Lorin Picket M.D.   On: 08/24/2018 08:31   Dg Chest Port 1 View  Result Date: 08/23/2018 CLINICAL DATA:  Acute respiratory failure EXAM: PORTABLE CHEST 1 VIEW COMPARISON:  08/22/2018 FINDINGS: Bilateral interstitial and alveolar airspace opacities. No pleural effusion or pneumothorax. Stable cardiomegaly. Prior CABG. No acute osseous abnormality. IMPRESSION: 1. Cardiomegaly and  bilateral interstitial and alveolar airspace opacities. Differential considerations include pulmonary edema versus multilobar pneumonia. Electronically Signed   By: Kathreen Devoid   On: 08/23/2018 17:54     Medications:   . sodium chloride     . allopurinol  100 mg Oral Daily  . amLODipine  10 mg Oral Daily  . aspirin EC  81 mg Oral Daily  . atorvastatin  40 mg Oral QHS  . carvedilol  12.5 mg Oral BID WC  . cholecalciferol  1,000 Units Oral Daily  . cloNIDine  0.1 mg Oral TID  . clopidogrel  75 mg Oral Daily  . docusate sodium  100 mg Oral BID  . dorzolamide-timolol  1 drop Both Eyes BID  . enalapril  2.5 mg Oral Daily  . heparin  5,000 Units Subcutaneous Q8H  . hydrALAZINE  100 mg Oral TID  . [START ON 08/26/2018] Influenza vac split quadrivalent PF  0.5 mL Intramuscular Tomorrow-1000  . insulin aspart  0-15 Units Subcutaneous TID WC  . insulin aspart  0-5 Units Subcutaneous QHS  . isosorbide mononitrate  120 mg Oral Daily  . mouth rinse  15 mL Mouth Rinse BID  . pantoprazole  40 mg Oral QHS  . [START ON 08/26/2018] pneumococcal 23 valent vaccine  0.5 mL Intramuscular Tomorrow-1000  . sodium chloride flush  3 mL Intravenous Q12H  . torsemide  60 mg Oral Daily   sodium chloride, acetaminophen **OR** acetaminophen, fluticasone, HYDROcodone-acetaminophen, hydrOXYzine, ondansetron **OR** ondansetron (ZOFRAN) IV, simethicone, sodium chloride flush  Assessment/ Plan:  77 y.o.African [place] male with a PMH of OSA, Prostate Cancer, MI, HTN, HLD, GERD, Esophageal Cancer, Diabetes Mellitus, CVA, CKD-Stage V, Carotid Stenosis, and CAD s/p CABG.  1.  Chronic kidney disease stage V 2.  Acute pulmonary edema 3.  Hypertension 4.  Peripheral edema  Patient has advanced chronic kidney disease and is nearing end-stage renal disease.    He has mild uremic symptoms.  Overall clinically he has improved. Started on oral torsemide. Will monitor how he does.  He has developing AV fistula which  will require angioplasty prior to use.  At present he seems to have turned the corner.  Electrolytes and volume status are acceptable.  No acute indication for dialysis today.  Patient states he drinks a lot of water due to dry mouth. High dose of clonidine is probably playing a role. Can consider decreasing the dose. Will add low dose ACE-I. Bp Control is acceptable - Place 1500 cc fluid restriction - OOB to chair, wean O2    LOS: Gratis 9/29/20199:49 AM  [place], [place]  Note: This note was prepared with Dragon dictation. Any transcription errors are unintentional

## 2018-08-26 ENCOUNTER — Inpatient Hospital Stay: Payer: Medicare PPO

## 2018-08-26 LAB — GLUCOSE, CAPILLARY
GLUCOSE-CAPILLARY: 255 mg/dL — AB (ref 70–99)
GLUCOSE-CAPILLARY: 158 mg/dL — AB (ref 70–99)
Glucose-Capillary: 201 mg/dL — ABNORMAL HIGH (ref 70–99)
Glucose-Capillary: 160 mg/dL — ABNORMAL HIGH (ref 70–99)

## 2018-08-26 LAB — BASIC METABOLIC PANEL
Anion gap: 9 (ref 5–15)
BUN: 97 mg/dL — AB (ref 8–23)
CO2: 22 mmol/L (ref 22–32)
Calcium: 8.7 mg/dL — ABNORMAL LOW (ref 8.9–10.3)
Chloride: 109 mmol/L (ref 98–111)
Creatinine, Ser: 4.18 mg/dL — ABNORMAL HIGH (ref 0.61–1.24)
GFR, EST AFRICAN AMERICAN: 15 mL/min — AB (ref >60)
GFR, EST NON AFRICAN AMERICAN: 13 mL/min — AB (ref >60)
Glucose, Bld: 258 mg/dL — ABNORMAL HIGH (ref 70–99)
POTASSIUM: 4.3 mmol/L (ref 3.5–5.1)
Sodium: 140 mmol/L (ref 135–145)

## 2018-08-26 MED ORDER — INSULIN DETEMIR 100 UNIT/ML ~~LOC~~ SOLN
10.0000 [IU] | Freq: Every day | SUBCUTANEOUS | Status: DC
  Administered 2018-08-26 – 2018-08-27 (×2): 10 [IU] via SUBCUTANEOUS
  Filled 2018-08-26 (×3): qty 0.1

## 2018-08-26 MED ORDER — FUROSEMIDE 10 MG/ML IJ SOLN
40.0000 mg | Freq: Once | INTRAMUSCULAR | Status: AC
  Administered 2018-08-26: 40 mg via INTRAVENOUS
  Filled 2018-08-26: qty 4

## 2018-08-26 MED ORDER — FUROSEMIDE 10 MG/ML IJ SOLN: 40.0000 mg | Freq: Two times a day (BID) | INTRAMUSCULAR | Status: DC

## 2018-08-26 NOTE — Progress Notes (Signed)
[facility], [place] 08/26/18  Subjective:  Patient continues to have quite diminished renal function.  Creatinine currently 4.18. He has a right upper extremity AV fistula in place.   Objective:  Vital signs in last 24 hours:  Temp:  [97.4 F (36.3 C)-98.9 F (37.2 C)] 97.4 F (36.3 C) (09/29 1914) Pulse Rate:  [78-93] 78 (09/30 0824) Resp:  [21-26] 22 (09/30 0548) BP: (131-179)/(44-76) 131/44 (09/30 0935) SpO2:  [89 %-98 %] 91 % (09/30 1037)  Weight change:  Filed Weights   08/22/18 1238  Weight: 126 kg    Intake/Output:    Intake/Output Summary (Last 24 hours) at 08/26/2018 1138 Last data filed at 08/26/2018 0800 Gross per 24 hour  Intake 360 ml  Output 475 ml  Net -115 ml     Physical Exam: General:  No acute distress, laying in the bed  HEENT  moist oral mucous membranes  Neck  supple  Pulm/lungs  clear b/l,  oxygen by nasal cannula,  CVS/Heart  regular, no rub  Abdomen:   Soft, obese, distended  Extremities:  SCDs in place , trace edema bilaterally  Neurologic:  Alert, decreased hearing, able to answer questions appropriately  Skin:  Normal turgor  Access:  Right upper arm AV fistula, developing       Basic Metabolic Panel:  Recent Labs  Lab 08/22/18 1256 08/22/18 2356 08/23/18 0555 08/24/18 0327 08/25/18 0327 08/26/18 1038  NA 139  --  144 140 142 140  K 3.9 4.1 4.0 3.8 4.3 4.3  CL 109  --  111 107 110 109  CO2 19*  --  21* 22 23 22   GLUCOSE 150*  --  160* 228* 207* 258*  BUN 106*  --  113* 106* 107* 97*  CREATININE 5.45*  --  4.95* 4.81* 4.41* 4.18*  CALCIUM 8.7*  --  8.8* 8.4* 8.4* 8.7*  MG 1.7 1.6*  --  2.0  --   --   PHOS  --   --   --  5.0*  --   --      CBC: Recent Labs  Lab 08/22/18 1256 08/23/18 0555 08/24/18 0327  WBC 6.3 5.5 6.2  NEUTROABS 5.2  --  4.6  HGB 9.1* 9.3* 9.1*  HCT 27.4* 27.8* 27.1*  MCV 93.7 93.3 92.8  PLT 242 236 245     No results found for: HEPBSAG, HEPBSAB,  HEPBIGM    Microbiology:  Recent Results (from the past 240 hour(s))  MRSA PCR Screening     Status: None   Collection Time: 08/22/18  3:55 PM  Result Value Ref Range Status   MRSA by PCR NEGATIVE NEGATIVE Final    Comment:        The GeneXpert MRSA Assay (FDA approved for NASAL specimens only), is one component of a comprehensive MRSA colonization surveillance program. It is not intended to diagnose MRSA infection nor to guide or monitor treatment for MRSA infections. Performed at [facility], [address] [place], [place] [address]     Coagulation Studies: No results for input(s): LABPROT, INR in the last 72 hours.  Urinalysis: No results for input(s): COLORURINE, LABSPEC, PHURINE, GLUCOSEU, HGBUR, BILIRUBINUR, KETONESUR, PROTEINUR, UROBILINOGEN, NITRITE, LEUKOCYTESUR in the last 72 hours.  Invalid input(s): APPERANCEUR    Imaging: No results found.   Medications:   . sodium chloride     . allopurinol  100 mg Oral Daily  . amLODipine  10 mg Oral Daily  . aspirin EC  81 mg Oral Daily  . atorvastatin  40 mg Oral QHS  . carvedilol  12.5 mg Oral BID WC  . cholecalciferol  1,000 Units Oral Daily  . cloNIDine  0.1 mg Oral TID  . clopidogrel  75 mg Oral Daily  . docusate sodium  100 mg Oral BID  . dorzolamide-timolol  1 drop Both Eyes BID  . enalapril  2.5 mg Oral Daily  . heparin  5,000 Units Subcutaneous Q8H  . hydrALAZINE  100 mg Oral TID  . insulin aspart  0-15 Units Subcutaneous TID WC  . insulin aspart  0-5 Units Subcutaneous QHS  . isosorbide mononitrate  120 mg Oral Daily  . mouth rinse  15 mL Mouth Rinse BID  . pantoprazole  40 mg Oral QHS  . sodium chloride flush  3 mL Intravenous Q12H  . torsemide  60 mg Oral Daily   sodium chloride, acetaminophen **OR** acetaminophen, fluticasone, HYDROcodone-acetaminophen, hydrOXYzine, ondansetron **OR** ondansetron (ZOFRAN) IV, simethicone, sodium chloride flush  Assessment/ Plan:  77  y.o.African [place] male with a PMH of OSA, Prostate Cancer, MI, HTN, HLD, GERD, Esophageal Cancer, Diabetes Mellitus, CVA, CKD-Stage V, Carotid Stenosis, and CAD s/p CABG.  1.  Chronic kidney disease stage V 2.  Acute pulmonary edema 3.  Hypertension 4.  Peripheral edema 5.  Anemia of CKD.  6.  Secondary hyperparathyroidism.   Plan:  Patient continues to have quite significantly diminished renal function.  Creatinine currently 4.18.  No urgent indication to start dialysis however we may need to consider this in the relative near future.  He is followed by [facility] nephrology as an outpatient.  Continue diuretic therapy for now.  Treat his acute pulmonary edema as well as peripheral edema.  Hemoglobin also low at 9.1 and he will likely need Epogen as an outpatient. We will continue to monitor his progress during his hospitalization.   LOS: 4 Cory Miller 9/30/201911:38 AM  [place], [place]  Note: This note was prepared with Dragon dictation. Any transcription errors are unintentional

## 2018-08-26 NOTE — Care Management Important Message (Signed)
Copy of signed IM left with patient in room.  

## 2018-08-26 NOTE — Evaluation (Signed)
Physical Therapy Evaluation Patient Details Name: Cory Miller MRN: [id number] DOB: 07-02-1941 Today's Date: 08/26/2018   History of Present Illness  Patient is a 77 year old male admitted from home with SOB. PMH includes HTN, DM, CAD, CKD, GERD, GOUT, HLD.   Clinical Impression  Patient received in bed. HOH. Agrees to walk. Patient able to walk 150 feet with RW, however ambulated on RA. Sats dropped to 86%. Sats returned to > 90% once returned to Nasal canula. Patient demonstrates good balance and ability with ambulation, limited by fatigue and respiratory limitations.       Follow Up Recommendations Home health PT    Equipment Recommendations  Rolling walker with 5" wheels    Recommendations for Other Services       Precautions / Restrictions Precautions Precautions: Fall Restrictions Weight Bearing Restrictions: No      Mobility  Bed Mobility Overal bed mobility: Needs Assistance Bed Mobility: Sit to Supine       Sit to supine: Mod assist   General bed mobility comments: assist needed to get legs back up onto bed.   Transfers Overall transfer level: Independent Equipment used: Rolling walker (2 wheeled)                Ambulation/Gait Ambulation/Gait assistance: Modified independent (Device/Increase time);Min guard Gait Distance (Feet): 150 Feet Assistive device: Rolling walker (2 wheeled) Gait Pattern/deviations: Step-to pattern Gait velocity: decreased   General Gait Details: patient ambulates with decreased step length and decreased foot clearance bilaterally.   Stairs            Wheelchair Mobility    Modified Rankin (Stroke Patients Only)       Balance Overall balance assessment: Needs assistance Sitting-balance support: Bilateral upper extremity supported;Feet supported Sitting balance-Leahy Scale: Good     Standing balance support: Bilateral upper extremity supported Standing balance-Leahy Scale: Fair Standing balance  comment: requires use of RW currently for safety due to weakness                              Pertinent Vitals/Pain Pain Assessment: No/denies pain    Home Living Family/patient expects to be discharged to:: Private residence Living Arrangements: Alone   Type of Home: House Home Access: Stairs to enter   [employer] of Steps: 3-4 steps with rail Home Layout: One level Home Equipment: Cane - single point Additional Comments: patient reports in the house he does not use any AD, when outside of the home uses cane.     Prior Function Level of Independence: Independent               Hand Dominance        Extremity/Trunk Assessment                Communication   Communication: HOH  Cognition Arousal/Alertness: Awake/alert Behavior During Therapy: WFL for tasks assessed/performed Overall Cognitive Status: Within Functional Limits for tasks assessed                                        General Comments      Exercises General Exercises - Lower Extremity Ankle Circles/Pumps: AROM;5 reps   Assessment/Plan    PT Assessment Patient needs continued PT services  PT Problem List Decreased strength;Decreased mobility;Cardiopulmonary status limiting activity;Decreased activity tolerance       PT Treatment  Interventions Gait training;Therapeutic exercise;Patient/family education;Therapeutic activities;Functional mobility training    PT Goals (Current goals can be found in the Care Plan section)  Acute Rehab PT Goals Patient Stated Goal: patient would like to go home PT Goal Formulation: With patient Time For Goal Achievement: 09/09/18 Potential to Achieve Goals: Good    Frequency Min 2X/week   Barriers to discharge        Co-evaluation               AM-PAC PT "6 Clicks" Daily Activity  Outcome Measure Difficulty turning over in bed (including adjusting bedclothes, sheets and blankets)?: Unable Difficulty  moving from lying on back to sitting on the side of the bed? : Unable Difficulty sitting down on and standing up from a chair with arms (e.g., wheelchair, bedside commode, etc,.)?: Unable Help needed moving to and from a bed to chair (including a wheelchair)?: A Little Help needed walking in hospital room?: A Little Help needed climbing 3-5 steps with a railing? : A Lot 6 Click Score: 11    End of Session Equipment Utilized During Treatment: Gait belt Activity Tolerance: Other (comment)(patient limited by decreased O2 sats and SOB) Patient left: in bed;with bed alarm set Nurse Communication: Mobility status PT Visit Diagnosis: Muscle weakness (generalized) (M62.81)    Time: 1010-1030 PT Time Calculation (min) (ACUTE ONLY): 20 min   Charges:   PT Evaluation $PT Eval Moderate Complexity: 1 Mod          Jaidy Cottam, PT, GCS 08/26/18,10:50 AM

## 2018-08-26 NOTE — Care Management (Signed)
Patient admitted from home with Acute hypoxic respiratory failure: due to fluid overload.  Patient lives at home alone.  States that his sister lives locally and provides transportation.  PCP Mancel Bale. Patient has a RW and cane in the home.  Patient states that he has not had home health services previously. Patient currently requiring acute O2.  PT has assessed patient and recommends home health PT. Patient agreeable to home health services.  States he does not have a preference of agency.  [facility] unable to accept patient due to his address.  Heads up referral made to [place] with [facility].  Patient is hard of hearing.  Patient has a special phone line that interprets his calls.  Caller must dial [phone]. Then dial the patient's home phone number which is correct on the face sheet.  Per patient's request sister [name] was updated with plan.  RN to attempt to wean patient from acute O2.

## 2018-08-26 NOTE — Progress Notes (Signed)
SATURATION QUALIFICATIONS: (This note is used to comply with regulatory documentation for home oxygen)  Patient Saturations on Room Air at Rest = 94  Patient Saturations on Room Air while Ambulating = 86  Patient Saturations on 1 Liters of oxygen while Ambulating = 90  Please briefly explain why patient needs home oxygen:

## 2018-08-26 NOTE — Progress Notes (Signed)
[facility] NAME: Cory Miller    MR#:  [id number]  DATE OF BIRTH:  June 20, 1941  SUBJECTIVE:  CHIEF COMPLAINT:   Chief Complaint  Patient presents with  . Shortness of Breath  no SOB. Tired, awake,. He had hypoxia on exertion today and still some edema.  REVIEW OF SYSTEMS:  Review of Systems  Constitutional: Positive for malaise/fatigue. Negative for chills and fever.  HENT: Negative for nosebleeds and sore throat.   Eyes: Negative for blurred vision.  Respiratory: Negative for cough, shortness of breath and wheezing.   Cardiovascular: Negative for chest pain, orthopnea, leg swelling and PND.  Gastrointestinal: Negative for abdominal pain, constipation, diarrhea, heartburn, nausea and vomiting.  Genitourinary: Negative for dysuria and urgency.  Musculoskeletal: Negative for back pain.  Skin: Negative for rash.  Neurological: Negative for dizziness, speech change, focal weakness and headaches.  Endo/Heme/Allergies: Does not bruise/bleed easily.  Psychiatric/Behavioral: Negative for depression.   DRUG ALLERGIES:  No Known Allergies VITALS:  Blood pressure (!) 131/44, pulse 78, temperature (!) 97.4 F (36.3 C), temperature source Oral, resp. rate (!) 22, height 6\' 1"  (1.854 m), weight 126 kg, SpO2 91 %. PHYSICAL EXAMINATION:  Physical Exam  Constitutional: He is oriented to person, place, and time.  HENT:  Head: Normocephalic and atraumatic.  Eyes: Pupils are equal, round, and reactive to light. Conjunctivae and EOM are normal.  Neck: Normal range of motion. Neck supple. No tracheal deviation present. No thyromegaly present.  Cardiovascular: Normal rate, regular rhythm and normal heart sounds.  Pulmonary/Chest: Effort normal and breath sounds normal. No respiratory distress. He has no wheezes. He exhibits no tenderness.  Abdominal: Soft. Bowel sounds are normal. He exhibits no distension. There is no tenderness.    Musculoskeletal: Normal range of motion.  Neurological: He is alert and oriented to person, place, and time. No cranial nerve deficit.  Skin: Skin is warm and dry. No rash noted.   LABORATORY PANEL:  Male CBC Recent Labs  Lab 08/24/18 0327  WBC 6.2  HGB 9.1*  HCT 27.1*  PLT 245   ------------------------------------------------------------------------------------------------------------------ Chemistries  Recent Labs  Lab 08/24/18 0327  08/26/18 1038  NA 140   < > 140  K 3.8   < > 4.3  CL 107   < > 109  CO2 22   < > 22  GLUCOSE 228*   < > 258*  BUN 106*   < > 97*  CREATININE 4.81*   < > 4.18*  CALCIUM 8.4*   < > 8.7*  MG 2.0  --   --    < > = values in this interval not displayed.   RADIOLOGY:  Dg Chest 2 View  Result Date: 08/26/2018 CLINICAL DATA:  Pulmonary edema. History of diabetes, hypertension, myocardial infarction and CVA. EXAM: CHEST - 2 VIEW COMPARISON:  08/24/2018 and 08/23/2018 radiographs. FINDINGS: Stable cardiomegaly and aortic atherosclerosis post CABG. The diffuse bilateral perihilar pulmonary opacities appear slightly improved, likely resolving edema. There is no confluent airspace opacity, pneumothorax or significant pleural effusion. Old rib fractures are noted on the left. IMPRESSION: Slight improvement in bilateral pulmonary opacities over the last 2 days, likely resolving pulmonary edema. Stable cardiomegaly. Electronically Signed   By: Richardean Sale M.D.   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   -Acute hypoxic respiratory failure: due to fluid overload Continue BiPAP for respiratory distress as need per PCCM- weaned to nasal canula now,. -Fluid overload and anasarca IV Lasix drip for diuresis-had good  diuresis and improved now.  Total 8 L negative balance.  Change to oral torsemide now per nephrologist. Reviewed echocardiogram- EF of 55% -Try to taper oxygen off and ambulate the patient. Still had some hypoxia on ambulation and required supplemental oxygen.  - Chronic-kidney disease stage V nearing end-stage renal disease Nephrology to decide on long term needs for HD As per nephrologist patient already have AV fistula placed but right now there is no need for hemodialysis at this time.  -Type 2 diabetes mellitus Diabetic diet with sliding scale coverage with insulin Decrease the dose of Levemir and monitor.  * Anemia of Chronic dz - stable  Epogen per nephrology.  -DVT prophylaxis subcu heparin  - HTn- Cont amlodipine, Coreg, clonidine, hydralazine, isosorbide and torsemide. Patient has polydipsia and as per nephrologist this could be due to clonidine high-dose so will cut down the dose.  All the records are reviewed and case discussed with Care Management/Social Worker. Management plans discussed with the patient, nursing and they are in agreement.  CODE STATUS: Full Code  TOTAL TIME TAKING CARE OF THIS PATIENT: 35 minutes.   More than 50% of the time was spent in counseling/coordination of care: YES  POSSIBLE D/C IN 1 DAYS, DEPENDING ON CLINICAL CONDITION. Need to have physical therapy evaluation and tapering off the oxygen requirement before discharge.  Vaughan Basta M.D on 08/26/2018 at 12:48 PM  Between 7am to 6pm - Pager - [phone]  After 6pm go to [email or web address] - Proofreader  [facility]  Office  [phone]  CC: Primary care physician; [name], MD  Note: This dictation was prepared with Dragon dictation along with smaller phrase technology. Any transcriptional errors that result from this process are unintentional.

## 2018-08-27 LAB — BASIC METABOLIC PANEL
Anion gap: 9 (ref 5–15)
BUN: 84 mg/dL — AB (ref 8–23)
CALCIUM: 8.9 mg/dL (ref 8.9–10.3)
CHLORIDE: 109 mmol/L (ref 98–111)
CO2: 24 mmol/L (ref 22–32)
Creatinine, Ser: 4.03 mg/dL — ABNORMAL HIGH (ref 0.61–1.24)
GFR, EST AFRICAN AMERICAN: 15 mL/min — AB (ref >60)
GFR, EST NON AFRICAN AMERICAN: 13 mL/min — AB (ref >60)
Glucose, Bld: 176 mg/dL — ABNORMAL HIGH (ref 70–99)
Potassium: 4.3 mmol/L (ref 3.5–5.1)
SODIUM: 142 mmol/L (ref 135–145)

## 2018-08-27 LAB — GLUCOSE, CAPILLARY
GLUCOSE-CAPILLARY: 160 mg/dL — AB (ref 70–99)
GLUCOSE-CAPILLARY: 262 mg/dL — AB (ref 70–99)

## 2018-08-27 MED ORDER — INSULIN DETEMIR 100 UNIT/ML ~~LOC~~ SOLN: 10.0000 [IU] | Freq: Every day | SUBCUTANEOUS | 11 refills | Status: DC

## 2018-08-27 MED ORDER — SIMETHICONE 80 MG PO CHEW: 160.0000 mg | CHEWABLE_TABLET | Freq: Four times a day (QID) | ORAL | 0 refills | Status: DC | PRN

## 2018-08-27 MED ORDER — ENALAPRIL MALEATE 2.5 MG PO TABS: 2.5000 mg | ORAL_TABLET | Freq: Every day | ORAL | 0 refills | Status: DC

## 2018-08-27 MED ORDER — TORSEMIDE 20 MG PO TABS: 60.0000 mg | ORAL_TABLET | Freq: Every day | ORAL | 0 refills | Status: DC

## 2018-08-27 MED ORDER — CLONIDINE HCL 0.1 MG PO TABS: 0.1000 mg | ORAL_TABLET | Freq: Three times a day (TID) | ORAL | 11 refills | Status: DC

## 2018-08-27 MED ORDER — PANTOPRAZOLE SODIUM 40 MG PO TBEC: 40.0000 mg | DELAYED_RELEASE_TABLET | Freq: Every day | ORAL | Status: DC

## 2018-08-27 MED ORDER — INSULIN DETEMIR 100 UNIT/ML ~~LOC~~ SOLN: 10.0000 [IU] | Freq: Every day | SUBCUTANEOUS | 11 refills | Status: AC

## 2018-08-27 NOTE — Progress Notes (Signed)
SATURATION QUALIFICATIONS: (This note is used to comply with regulatory documentation for home oxygen)  Patient Saturations on Room Air at Rest = 90%  Patient Saturations on Room Air while Ambulating = 83%  Patient Saturations on 2 Liters of oxygen while Ambulating = 92%  Please briefly explain why patient needs home oxygen: 

## 2018-08-27 NOTE — Discharge Summary (Signed)
[facility] NAME: Cory Miller    MR#:  [id number]  DATE OF BIRTH:  07-30-1941  DATE OF ADMISSION:  08/22/2018 ADMITTING PHYSICIAN: Saundra Shelling, MD  DATE OF DISCHARGE: 08/27/2018   PRIMARY CARE PHYSICIAN: [name], MD    ADMISSION DIAGNOSIS:  Shortness of breath [R06.02] End stage renal disease ([place]) [N18.6] Acute pulmonary edema (HCC) [J81.0]  DISCHARGE DIAGNOSIS:  Active Problems:   Respiratory failure (HCC)   Pressure injury of skin   SECONDARY DIAGNOSIS:   Past Medical History:  Diagnosis Date  . Blood dyscrasia    prostate  . CAD (coronary artery disease) of artery bypass graft   . Carotid atherosclerosis   . Carotid stenosis   . Chronic kidney disease   . CKD (chronic kidney disease), stage V ([place])   . CVA (cerebrovascular accident) ([place])   . Diabetes mellitus without complication ([place])   . Dyspnea   . ED (erectile dysfunction)   . Elevated troponin I level   . Esophageal ulcer   . GERD (gastroesophageal reflux disease)   . Gout   . Hard of hearing    Pt very hard  of hearing  . Hyperlipidemia   . Hypertension   . MI (myocardial infarction) ([place])   . Neuropathy   . Occlusion of both internal carotid arteries   . Prostate CA ([place])   . 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  -Acute hypoxic respiratory failure: due to fluid overload- Chronic renal failure and pulm edema, CH diastolic CHF Continue BiPAP for respiratory distress as need per PCCM- weaned to nasal canula now,. -Fluid overload and anasarca IV Lasix drip for diuresis-had good diuresis and improved now.  Total 8 L negative balance.  Change to oral torsemide now per nephrologist. Reviewed  echocardiogram- EF of 55% -Try to taper oxygen off and ambulate the patient. Still had some hypoxia on ambulation and required supplemental oxygen inspit of trying all these treatment. So he will need home O2 on discharge.  - Chronic-kidney disease stage V nearing end-stage renal disease Nephrology to decide on long term needs for HD As per nephrologist patient already have AV fistula placed but right now there is no need for hemodialysis at this time.  -Type 2 diabetes mellitus Diabetic diet with sliding scale coverage with insulin Decrease the dose of Levemir and monitor.  * Anemia of Chronic dz - stable  Epogen per nephrology.  -DVT prophylaxis subcu heparin  - HTn- Cont amlodipine, Coreg, clonidine, hydralazine, isosorbide and torsemide. Patient has polydipsia and as per nephrologist this could be due to clonidine high-dose so will cut down the dose.  DISCHARGE CONDITIONS:   Stable.  CONSULTS OBTAINED:  Treatment Team:  Murlean Iba, MD Heber Lander, MD  DRUG ALLERGIES:  No Known Allergies  DISCHARGE MEDICATIONS:   Allergies as of 08/27/2018   No Known Allergies     Medication List    STOP taking these medications   furosemide 40 MG tablet Commonly known as:  LASIX   HYDROcodone-acetaminophen 5-325 MG tablet Commonly known as:  NORCO/VICODIN   loratadine 10 MG tablet Commonly known as:  CLARITIN     TAKE these medications   allopurinol 100 MG tablet Commonly known as:  ZYLOPRIM Take 100  mg by mouth daily.   amLODipine 10 MG tablet Commonly known as:  NORVASC Take 10 mg by mouth daily.   aspirin 81 MG tablet Take 81 mg by mouth daily.   atorvastatin 40 MG tablet Commonly known as:  LIPITOR Take 40 mg by mouth at bedtime.   carvedilol 12.5 MG tablet Commonly known as:  COREG Take 12.5 mg by mouth 2 (two) times daily with a meal.   cholecalciferol 1000 units tablet Commonly known as:  VITAMIN D Take 1,000 Units by mouth daily.    cloNIDine 0.1 MG tablet Commonly known as:  CATAPRES Take 1 tablet (0.1 mg total) by mouth 3 (three) times daily. What changed:    medication strength  how much to take   clopidogrel 75 MG tablet Commonly known as:  PLAVIX Take 75 mg by mouth daily.   Cod Liver Oil Caps Take 1 capsule by mouth daily.   colchicine 0.6 MG tablet Take 0.6 mg by mouth every other day.   docusate sodium 100 MG capsule Commonly known as:  COLACE Take 100 mg by mouth 2 (two) times daily.   dorzolamide-timolol 22.3-6.8 MG/ML ophthalmic solution Commonly known as:  COSOPT Place 1 drop into both eyes 2 (two) times daily.   enalapril 2.5 MG tablet Commonly known as:  VASOTEC Take 1 tablet (2.5 mg total) by mouth daily. Start taking on:  08/28/2018   Flax Seed Oil 1000 MG Caps Take 1,300 mg by mouth daily.   fluticasone 50 MCG/ACT nasal spray Commonly known as:  FLONASE Place 1 spray into both nostrils daily as needed for allergies.   hydrALAZINE 100 MG tablet Commonly known as:  APRESOLINE Take 100 mg by mouth 3 (three) times daily.   hydrOXYzine 25 MG tablet Commonly known as:  ATARAX/VISTARIL Take 25 mg by mouth 4 (four) times daily as needed for itching.   insulin aspart 100 UNIT/ML FlexPen Commonly known as:  NOVOLOG Inject 10 Units into the skin 3 (three) times daily with meals.   insulin detemir 100 UNIT/ML injection Commonly known as:  LEVEMIR Inject 0.1 mLs (10 Units total) into the skin daily. Start taking on:  08/28/2018 What changed:    how much to take  when to take this   isosorbide mononitrate 120 MG 24 hr tablet Commonly known as:  IMDUR Take 120 mg by mouth daily.   ketoconazole 2 % shampoo Commonly known as:  NIZORAL Apply 1 application topically 2 (two) times a week.   oxyCODONE-acetaminophen 5-325 MG tablet Commonly known as:  PERCOCET/ROXICET Take 1 tablet by mouth 3 (three) times daily.   pantoprazole 40 MG tablet Commonly known as:  PROTONIX Take 1  tablet (40 mg total) by mouth daily. 40 mg BID for 1 month followed by once daily What changed:    when to take this  additional instructions   simethicone 80 MG chewable tablet Commonly known as:  MYLICON Chew 2 tablets (160 mg total) by mouth 4 (four) times daily as needed for flatulence.   SPS 15 GM/60ML suspension Generic drug:  sodium polystyrene Take 60 mLs by mouth as directed. When eating potassium rich foods   torsemide 20 MG tablet Commonly known as:  DEMADEX Take 3 tablets (60 mg total) by mouth daily. Start taking on:  08/28/2018   triamcinolone cream 0.1 % Commonly known as:  KENALOG Apply 1 application topically 2 (two) times daily as needed (ITCHING).            Durable Medical Equipment  (  From admission, onward)         Start     Ordered   08/27/18 1307  For home use only DME oxygen  Once    Comments:  Diastolic CHF, Renal failure, Pulmonary edema  Question Answer Comment  Mode or (Route) Nasal cannula   Liters per Minute 2   Frequency Continuous (stationary and portable oxygen unit needed)   Oxygen conserving device Yes   Oxygen delivery system Gas      08/27/18 1307           DISCHARGE INSTRUCTIONS:    Follow with PMD in 1 week, get renal func checked  If you experience worsening of your admission symptoms, develop shortness of breath, life threatening emergency, suicidal or homicidal thoughts you must seek medical attention immediately by calling 911 or calling your MD immediately  if symptoms less severe.  You Must read complete instructions/literature along with all the possible adverse reactions/side effects for all the Medicines you take and that have been prescribed to you. Take any new Medicines after you have completely understood and accept all the possible adverse reactions/side effects.   Please note  You were cared for by a hospitalist during your hospital stay. If you have any questions about your discharge medications or the  care you received while you were in the hospital after you are discharged, you can call the unit and asked to speak with the hospitalist on call if the hospitalist that took care of you is not available. Once you are discharged, your primary care physician will handle any further medical issues. Please note that NO REFILLS for any discharge medications will be authorized once you are discharged, as it is imperative that you return to your primary care physician (or establish a relationship with a primary care physician if you do not have one) for your aftercare needs so that they can reassess your need for medications and monitor your lab values.    Today   CHIEF COMPLAINT:   Chief Complaint  Patient presents with  . Shortness of Breath    HISTORY OF PRESENT ILLNESS:  Cory Miller  is a 77 y.o. 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  Patient was short of breath and hypoxic was put on BiPAP in the emergency room for respiratory distress.  He has generalized edema and swelling all over the body with anasarca.  80 mg IV Lasix was given for diuresis in the emergency room and patient started on IV Lasix drip.  Hospitalist service was consulted for further care.   VITAL SIGNS:  Blood pressure (!) 145/53, pulse 80, temperature 98.7 F (37.1 C), temperature source Oral, resp. rate 18, height 6\' 1"  (1.854 m), weight 126 kg, SpO2 100 %.  I/O:    Intake/Output Summary (Last 24 hours) at 08/27/2018 1404 Last data filed at 08/27/2018 1234 Gross per 24 hour  Intake 120 ml  Output 2150 ml  Net -2030 ml    PHYSICAL EXAMINATION:   Constitutional: He is oriented to person, place, and time.  HENT:  Head: Normocephalic and atraumatic.  Eyes: Pupils are equal, round, and reactive to light. Conjunctivae  and EOM are normal.  Neck: Normal range of motion. Neck supple. No tracheal deviation present. No thyromegaly present.  Cardiovascular: Normal rate, regular rhythm and normal heart sounds.  Pulmonary/Chest: Effort normal and breath sounds normal. No respiratory distress. He has no wheezes. He exhibits no tenderness.  Abdominal: Soft. Bowel sounds are normal. He exhibits no distension. There is no tenderness.  Musculoskeletal: Normal range of motion.  Neurological: He is alert and oriented to person, place, and time. No cranial nerve deficit.  Skin: Skin is warm and dry. No rash noted.   DATA REVIEW:   CBC Recent Labs  Lab 08/24/18 0327  WBC 6.2  HGB 9.1*  HCT 27.1*  PLT 245    Chemistries  Recent Labs  Lab 08/24/18 0327  08/27/18 0924  NA 140   < > 142  K 3.8   < > 4.3  CL 107   < > 109  CO2 22   < > 24  GLUCOSE 228*   < > 176*  BUN 106*   < > 84*  CREATININE 4.81*   < > 4.03*  CALCIUM 8.4*   < > 8.9  MG 2.0  --   --    < > = values in this interval not displayed.    Cardiac Enzymes Recent Labs  Lab 08/22/18 1256  TROPONINI <0.03    Microbiology Results  Results for orders placed or performed during the hospital encounter of 08/22/18  MRSA PCR Screening     Status: None   Collection Time: 08/22/18  3:55 PM  Result Value Ref Range Status   MRSA by PCR NEGATIVE NEGATIVE Final    Comment:        The GeneXpert MRSA Assay (FDA approved for NASAL specimens only), is one component of a comprehensive MRSA colonization surveillance program. It is not intended to diagnose MRSA infection nor to guide or monitor treatment for MRSA infections. Performed at [facility], [address] [place], [place] [address]     RADIOLOGY:  Dg Chest 2 View  Result Date: 08/26/2018 CLINICAL DATA:  Pulmonary edema. History of diabetes, hypertension, myocardial infarction and CVA. EXAM: CHEST - 2 VIEW COMPARISON:  08/24/2018 and 08/23/2018 radiographs. FINDINGS:  Stable cardiomegaly and aortic atherosclerosis post CABG. The diffuse bilateral perihilar pulmonary opacities appear slightly improved, likely resolving edema. There is no confluent airspace opacity, pneumothorax or significant pleural effusion. Old rib fractures are noted on the left. IMPRESSION: Slight improvement in bilateral pulmonary opacities over the last 2 days, likely resolving pulmonary edema. Stable cardiomegaly. Electronically Signed   By: Richardean Sale M.D.   On: 08/26/2018 12:06    EKG:   Orders placed or performed during the hospital encounter of 08/22/18  . ED EKG  . ED EKG  . EKG 12-Lead  . EKG 12-Lead      Management plans discussed with the patient, family and they are in agreement.  CODE STATUS:     Code Status Orders  (From admission, onward)         Start     Ordered   08/22/18 1546  Full code  Continuous     08/22/18 1546        Code Status History    Date Active Date Inactive Code Status Order ID Comments User Context   04/04/2016 0119 04/04/2016 1817 Full Code [id number]  Lance Coon, MD Inpatient   08/02/2015 1404 08/06/2015 1727 Full Code [id number]  Bettey Costa, MD Inpatient      TOTAL TIME TAKING CARE OF THIS PATIENT: 35 minutes.    Vaughan Basta M.D on 08/27/2018 at 2:04 PM  Between 7am to 6pm - Pager - [phone]  After 6pm go to [email or web address] - password [email or web address] [facility]  Office  [phone]  CC: Primary care physician; [name], MD   Note: This dictation was prepared with Dragon dictation along with smaller phrase technology. Any transcriptional errors that result from this process are unintentional.

## 2018-08-27 NOTE — Progress Notes (Signed)
Cory Miller  A and O x 4. VSS. Pt tolerating diet well. No complaints of pain or nausea. IV removed intact, prescriptions given. Pt voiced understanding of discharge instructions with no further questions. Pt discharged via wheelchair with nurse tech.  Allergies as of 08/27/2018   No Known Allergies     Medication List    STOP taking these medications   furosemide 40 MG tablet Commonly known as:  LASIX   HYDROcodone-acetaminophen 5-325 MG tablet Commonly known as:  NORCO/VICODIN   loratadine 10 MG tablet Commonly known as:  CLARITIN     TAKE these medications   allopurinol 100 MG tablet Commonly known as:  ZYLOPRIM Take 100 mg by mouth daily.   amLODipine 10 MG tablet Commonly known as:  NORVASC Take 10 mg by mouth daily.   aspirin 81 MG tablet Take 81 mg by mouth daily.   atorvastatin 40 MG tablet Commonly known as:  LIPITOR Take 40 mg by mouth at bedtime.   carvedilol 12.5 MG tablet Commonly known as:  COREG Take 12.5 mg by mouth 2 (two) times daily with a meal.   cholecalciferol 1000 units tablet Commonly known as:  VITAMIN D Take 1,000 Units by mouth daily.   cloNIDine 0.1 MG tablet Commonly known as:  CATAPRES Take 1 tablet (0.1 mg total) by mouth 3 (three) times daily. What changed:    medication strength  how much to take   clopidogrel 75 MG tablet Commonly known as:  PLAVIX Take 75 mg by mouth daily.   Cod Liver Oil Caps Take 1 capsule by mouth daily.   colchicine 0.6 MG tablet Take 0.6 mg by mouth every other day.   docusate sodium 100 MG capsule Commonly known as:  COLACE Take 100 mg by mouth 2 (two) times daily.   dorzolamide-timolol 22.3-6.8 MG/ML ophthalmic solution Commonly known as:  COSOPT Place 1 drop into both eyes 2 (two) times daily.   enalapril 2.5 MG tablet Commonly known as:  VASOTEC Take 1 tablet (2.5 mg total) by mouth daily. Start taking on:  08/28/2018   Flax Seed Oil 1000 MG Caps Take 1,300 mg by mouth daily.    fluticasone 50 MCG/ACT nasal spray Commonly known as:  FLONASE Place 1 spray into both nostrils daily as needed for allergies.   hydrALAZINE 100 MG tablet Commonly known as:  APRESOLINE Take 100 mg by mouth 3 (three) times daily.   hydrOXYzine 25 MG tablet Commonly known as:  ATARAX/VISTARIL Take 25 mg by mouth 4 (four) times daily as needed for itching.   insulin aspart 100 UNIT/ML FlexPen Commonly known as:  NOVOLOG Inject 10 Units into the skin 3 (three) times daily with meals.   insulin detemir 100 UNIT/ML injection Commonly known as:  LEVEMIR Inject 0.1 mLs (10 Units total) into the skin daily. Start taking on:  08/28/2018 What changed:    how much to take  when to take this   isosorbide mononitrate 120 MG 24 hr tablet Commonly known as:  IMDUR Take 120 mg by mouth daily.   ketoconazole 2 % shampoo Commonly known as:  NIZORAL Apply 1 application topically 2 (two) times a week.   oxyCODONE-acetaminophen 5-325 MG tablet Commonly known as:  PERCOCET/ROXICET Take 1 tablet by mouth 3 (three) times daily.   pantoprazole 40 MG tablet Commonly known as:  PROTONIX Take 1 tablet (40 mg total) by mouth daily. 40 mg BID for 1 month followed by once daily What changed:    when to take  this  additional instructions   simethicone 80 MG chewable tablet Commonly known as:  MYLICON Chew 2 tablets (160 mg total) by mouth 4 (four) times daily as needed for flatulence.   SPS 15 GM/60ML suspension Generic drug:  sodium polystyrene Take 60 mLs by mouth as directed. When eating potassium rich foods   torsemide 20 MG tablet Commonly known as:  DEMADEX Take 3 tablets (60 mg total) by mouth daily. Start taking on:  08/28/2018   triamcinolone cream 0.1 % Commonly known as:  KENALOG Apply 1 application topically 2 (two) times daily as needed (ITCHING).            Durable Medical Equipment  (From admission, onward)         Start     Ordered   08/27/18 1307  For home  use only DME oxygen  Once    Comments:  Diastolic CHF, Renal failure, Pulmonary edema  Question Answer Comment  Mode or (Route) Nasal cannula   Liters per Minute 2   Frequency Continuous (stationary and portable oxygen unit needed)   Oxygen conserving device Yes   Oxygen delivery system Gas      08/27/18 1307          Vitals:   08/27/18 1254 08/27/18 1521  BP: (!) 145/53 (!) 140/50  Pulse: 80 88  Resp: 18 14  Temp: 98.7 F (37.1 C) 98.3 F (36.8 C)  SpO2: 100% 100%    Francesco Sor

## 2018-08-27 NOTE — Progress Notes (Signed)
ABC intact, 2L oxygen via  with portable tank placed on patient. Discharge instructions given, all questions answered. Sisters verbalized information back. No further needs. D/C via w/c by this nurse to car with sister driving.

## 2018-08-27 NOTE — Progress Notes (Signed)
[facility], [place] 08/27/18  Subjective:  Patient resting comfortably in bed.  He Urine output was 1.7 L over the preceding 24 hours. EGFR currently 15.  Objective:  Vital signs in last 24 hours:  Temp:  [97.7 F (36.5 C)-98.8 F (37.1 C)] 98.7 F (37.1 C) (10/01 1254) Pulse Rate:  [80-90] 80 (10/01 1254) Resp:  [18-20] 18 (10/01 1254) BP: (133-163)/(49-57) 145/53 (10/01 1254) SpO2:  [93 %-100 %] 100 % (10/01 1254)  Weight change:  Filed Weights   08/22/18 1238  Weight: 126 kg    Intake/Output:    Intake/Output Summary (Last 24 hours) at 08/27/2018 1455 Last data filed at 08/27/2018 1234 Gross per 24 hour  Intake 120 ml  Output 1950 ml  Net -1830 ml     Physical Exam: General:  No acute distress, laying in the bed  HEENT  moist oral mucous membranes  Neck  supple  Pulm/lungs  clear b/l,  Normal effort.  CVS/Heart  regular, no rub  Abdomen:   Soft, obese, distended  Extremities:   trace edema bilaterally  Neurologic:  Alert, decreased hearing, able to answer questions appropriately  Skin:  Normal turgor  Access:  Right upper arm AV fistula, developing       Basic Metabolic Panel:  Recent Labs  Lab 08/22/18 1256 08/22/18 2356 08/23/18 0555 08/24/18 0327 08/25/18 0327 08/26/18 1038 08/27/18 0924  NA 139  --  144 140 142 140 142  K 3.9 4.1 4.0 3.8 4.3 4.3 4.3  CL 109  --  111 107 110 109 109  CO2 19*  --  21* '22 23 22 24  ' GLUCOSE 150*  --  160* 228* 207* 258* 176*  BUN 106*  --  113* 106* 107* 97* 84*  CREATININE 5.45*  --  4.95* 4.81* 4.41* 4.18* 4.03*  CALCIUM 8.7*  --  8.8* 8.4* 8.4* 8.7* 8.9  MG 1.7 1.6*  --  2.0  --   --   --   PHOS  --   --   --  5.0*  --   --   --      CBC: Recent Labs  Lab 08/22/18 1256 08/23/18 0555 08/24/18 0327  WBC 6.3 5.5 6.2  NEUTROABS 5.2  --  4.6  HGB 9.1* 9.3* 9.1*  HCT 27.4* 27.8* 27.1*  MCV 93.7 93.3 92.8  PLT 242 236 245     No results found for: HEPBSAG, HEPBSAB,  HEPBIGM    Microbiology:  Recent Results (from the past 240 hour(s))  MRSA PCR Screening     Status: None   Collection Time: 08/22/18  3:55 PM  Result Value Ref Range Status   MRSA by PCR NEGATIVE NEGATIVE Final    Comment:        The GeneXpert MRSA Assay (FDA approved for NASAL specimens only), is one component of a comprehensive MRSA colonization surveillance program. It is not intended to diagnose MRSA infection nor to guide or monitor treatment for MRSA infections. Performed at [facility], [address] [place], [place] [address]     Coagulation Studies: No results for input(s): LABPROT, INR in the last 72 hours.  Urinalysis: No results for input(s): COLORURINE, LABSPEC, PHURINE, GLUCOSEU, HGBUR, BILIRUBINUR, KETONESUR, PROTEINUR, UROBILINOGEN, NITRITE, LEUKOCYTESUR in the last 72 hours.  Invalid input(s): APPERANCEUR    Imaging: Dg Chest 2 View  Result Date: 08/26/2018 CLINICAL DATA:  Pulmonary edema. History of diabetes, hypertension, myocardial infarction and CVA. EXAM: CHEST - 2 VIEW  COMPARISON:  08/24/2018 and 08/23/2018 radiographs. FINDINGS: Stable cardiomegaly and aortic atherosclerosis post CABG. The diffuse bilateral perihilar pulmonary opacities appear slightly improved, likely resolving edema. There is no confluent airspace opacity, pneumothorax or significant pleural effusion. Old rib fractures are noted on the left. IMPRESSION: Slight improvement in bilateral pulmonary opacities over the last 2 days, likely resolving pulmonary edema. Stable cardiomegaly. Electronically Signed   By: Richardean Sale M.D.   On: 08/26/2018 12:06     Medications:   . sodium chloride     . allopurinol  100 mg Oral Daily  . amLODipine  10 mg Oral Daily  . aspirin EC  81 mg Oral Daily  . atorvastatin  40 mg Oral QHS  . carvedilol  12.5 mg Oral BID WC  . cholecalciferol  1,000 Units Oral Daily  . cloNIDine  0.1 mg Oral TID  . clopidogrel  75 mg Oral Daily  .  docusate sodium  100 mg Oral BID  . dorzolamide-timolol  1 drop Both Eyes BID  . enalapril  2.5 mg Oral Daily  . heparin  5,000 Units Subcutaneous Q8H  . hydrALAZINE  100 mg Oral TID  . insulin aspart  0-15 Units Subcutaneous TID WC  . insulin aspart  0-5 Units Subcutaneous QHS  . insulin detemir  10 Units Subcutaneous Daily  . isosorbide mononitrate  120 mg Oral Daily  . mouth rinse  15 mL Mouth Rinse BID  . pantoprazole  40 mg Oral QHS  . sodium chloride flush  3 mL Intravenous Q12H  . torsemide  60 mg Oral Daily   sodium chloride, acetaminophen **OR** acetaminophen, fluticasone, HYDROcodone-acetaminophen, hydrOXYzine, ondansetron **OR** ondansetron (ZOFRAN) IV, simethicone, sodium chloride flush  Assessment/ Plan:  77 y.o.African [place] male with a PMH of OSA, Prostate Cancer, MI, HTN, HLD, GERD, Esophageal Cancer, Diabetes Mellitus, CVA, CKD-Stage V, Carotid Stenosis, and CAD s/p CABG.  1.  Chronic kidney disease stage V 2.  Acute pulmonary edema 3.  Hypertension 4.  Peripheral edema 5.  Anemia of CKD.  6.  Secondary hyperparathyroidism.   Plan:  Overall the patient continues to have diminished renal function.  Most recent EGFR was 15.  He will likely need to start renal placement therapy in the relative near future as an outpatient but it is not necessary immediately. Recommend continued monitoring for any worsening pulmonary edema.  We will continue to monitor the patient's progress.   LOS: 5 Cristino Degroff 10/1/20192:55 PM  [place], [place]  Note: This note was prepared with Dragon dictation. Any transcription errors are unintentional

## 2018-08-27 NOTE — Care Management Note (Signed)
Case Management Note  Patient Details  Name: [name] MRN: [id number] Date of Birth: 1941/09/10   Patient to discharge today.  Patient does qualify for home O2 at discharge. Portable O2 delivered to room by jason with [employer].  [place] with [facility] notified of discharge, as well as specific contact information for patient.  Both of the patient's sisters at bedside for discharge.  Provided Personal Care Services list as requested.  Sisters that the patient's son will be staying with the patient at night.  Sister inquired about patient going to SNF at discharge, however at this time there is no skilled need indicated.    Subjective/Objective:                    Action/Plan:   Expected Discharge Date:  08/27/18               Expected Discharge Plan:  [place]  In-House Referral:     Discharge planning Services  CM Consult  Post Acute Care Choice:  Durable Medical Equipment, Home Health Choice offered to:  Patient, Sibling  DME Arranged:  Oxygen DME Agency:  [place]:  RN, PT, Nurse's Aide, Social Work [facility] Agency:  [facility]  Status of Service:  Completed, signed off  If discussed at [employer], dates discussed:    Additional Comments:  B[place]ly Sessions, RN 08/27/2018, 3:13 PM

## 2018-09-20 ENCOUNTER — Encounter: Payer: Medicare PPO | Attending: Physician Assistant | Admitting: Physician Assistant

## 2018-09-20 DIAGNOSIS — Z951 Presence of aortocoronary bypass graft: Secondary | ICD-10-CM | POA: Insufficient documentation

## 2018-09-20 DIAGNOSIS — G4730 Sleep apnea, unspecified: Secondary | ICD-10-CM | POA: Diagnosis not present

## 2018-09-20 DIAGNOSIS — M109 Gout, unspecified: Secondary | ICD-10-CM | POA: Insufficient documentation

## 2018-09-20 DIAGNOSIS — I2510 Atherosclerotic heart disease of native coronary artery without angina pectoris: Secondary | ICD-10-CM | POA: Diagnosis not present

## 2018-09-20 DIAGNOSIS — Z992 Dependence on renal dialysis: Secondary | ICD-10-CM | POA: Diagnosis not present

## 2018-09-20 DIAGNOSIS — Z8673 Personal history of transient ischemic attack (TIA), and cerebral infarction without residual deficits: Secondary | ICD-10-CM | POA: Diagnosis not present

## 2018-09-20 DIAGNOSIS — J449 Chronic obstructive pulmonary disease, unspecified: Secondary | ICD-10-CM | POA: Diagnosis not present

## 2018-09-20 DIAGNOSIS — N186 End stage renal disease: Secondary | ICD-10-CM | POA: Diagnosis not present

## 2018-09-20 DIAGNOSIS — E11622 Type 2 diabetes mellitus with other skin ulcer: Secondary | ICD-10-CM | POA: Diagnosis present

## 2018-09-20 DIAGNOSIS — E1122 Type 2 diabetes mellitus with diabetic chronic kidney disease: Secondary | ICD-10-CM | POA: Insufficient documentation

## 2018-09-20 DIAGNOSIS — I252 Old myocardial infarction: Secondary | ICD-10-CM | POA: Diagnosis not present

## 2018-09-20 DIAGNOSIS — Z87891 Personal history of nicotine dependence: Secondary | ICD-10-CM | POA: Insufficient documentation

## 2018-09-20 DIAGNOSIS — I120 Hypertensive chronic kidney disease with stage 5 chronic kidney disease or end stage renal disease: Secondary | ICD-10-CM | POA: Diagnosis not present

## 2018-09-24 NOTE — Progress Notes (Signed)
Cory Miller, Cory Miller ([id number]) Visit Report for 09/20/2018 Chief Complaint Document Details Patient Name: Cory, Miller Date of Service: 09/20/2018 1:00 PM Medical Record Number: [id number] Patient Account Number: [id number] Date of Birth/Sex: May 04, 1941 (77 y.o. M) Treating RN: Montey Hora Primary Care Provider: [name] Other Clinician: Referring Provider: Referral, Self Treating Provider/Extender: Melburn Hake, HOYT Weeks in Treatment: 0 Information Obtained from: Patient Chief Complaint Bilateral gluteal wounds Electronic Signature(s) Signed: 09/20/2018 6:09:48 PM By: Worthy Keeler PA-C Entered By: Worthy Keeler on 09/20/2018 18:04:03 Cory Miller ([id number]) -------------------------------------------------------------------------------- HPI Details Patient Name: Cory Miller Date of Service: 09/20/2018 1:00 PM Medical Record Number: [id number] Patient Account Number: [id number] Date of Birth/Sex: 1941-01-22 (77 y.o. M) Treating RN: Montey Hora Primary Care Provider: [name] Other Clinician: Referring Provider: Referral, Self Treating Provider/Extender: Melburn Hake, HOYT Weeks in Treatment: 0 History of Present Illness HPI Description: 09/20/18 on evaluation today patient presents with a history of having several significant medical problems. This includes stroke, myocardial infarction, heart bypass surgery, significant hearing loss, sleep apnea, and he is a former smoker along with being diabetic. He is very close to stage IV possibly in stage renal disease and is prepping at this point for dialysis. Fortunately there does not appear to be active issues despite the patient's significant medical history he is able to ambulate today although slowly. With that being said he does have two areas on his gluteal area one right and one left which actually appear to be completely healed today. Nonetheless at times this will become very painful according  to what the patient is telling me. His sister who was present during evaluation though not in the room for the majority of it states that he intermittently complains about pain and they were under the assumption that this was going to need to be lanced and opened up as there was infection underline. With that being said I do not see any evidence of infection at this time and in fact it does not even appear to be open wound currently. I do think she's prone to and possibly if he sits for too long of a period of time in his chair as he tells me he sometimes does he may indeed have areas that will open in this regard. Nonetheless at this point there is no opening. Electronic Signature(s) Signed: 09/20/2018 6:09:48 PM By: Worthy Keeler PA-C Entered By: Worthy Keeler on 09/20/2018 18:06:21 Cory Miller ([id number]) -------------------------------------------------------------------------------- Physical Exam Details Patient Name: Cory Miller Date of Service: 09/20/2018 1:00 PM Medical Record Number: [id number] Patient Account Number: [id number] Date of Birth/Sex: Apr 25, 1941 (77 y.o. M) Treating RN: Montey Hora Primary Care Provider: [name] Other Clinician: Referring Provider: Referral, Self Treating Provider/Extender: STONE III, HOYT Weeks in Treatment: 0 Constitutional patient is hypertensive.. pulse regular and within target range for patient.[name] respirations regular, non-labored and within target range for patient.[name] temperature within target range for patient.. Well-nourished and well-hydrated in no acute distress. Eyes conjunctiva clear no eyelid edema noted. pupils equal round and reactive to light and accommodation. Ears, Nose, Mouth, and Throat no gross abnormality of ear auricles or external auditory canals. normal hearing noted during conversation. mucus membranes moist. Respiratory normal breathing without difficulty. clear to auscultation  bilaterally. Cardiovascular regular rate and rhythm with normal S1, S2. no clubbing, cyanosis, significant edema, <3 sec cap refill. Gastrointestinal (GI) soft, non-tender, non-distended, +BS. no ventral hernia noted. Musculoskeletal unsteady while walking but ambulated with his oxygen to stabilize him.[name] Psychiatric this patient is able to  make decisions and demonstrates good insight into disease process. Alert and Oriented x 3. pleasant and cooperative. Notes Patient's skin examination revealed no ulcerated openings on the gluteal region and I did look over the area extensively to ensure that I was not missing anything at this point. With that being said there does not appear to be any evidence of infection either and definitely no sign of an abscess. I feel like that he does have some scarring secondary to the fact that he has had ulcerations recently but again they are not active at this point. Electronic Signature(s) Signed: 09/20/2018 6:09:48 PM By: Worthy Keeler PA-C Entered By: Worthy Keeler on 09/20/2018 18:07:42 Cory Miller ([id number]) -------------------------------------------------------------------------------- Physician Orders Details Patient Name: Cory Miller Date of Service: 09/20/2018 1:00 PM Medical Record Number: [id number] Patient Account Number: [id number] Date of Birth/Sex: 1941/09/22 (77 y.o. M) Treating RN: Cornell Barman Primary Care Provider: [name] Other Clinician: Referring Provider: Referral, Self Treating Provider/Extender: Melburn Hake, HOYT Weeks in Treatment: 0 Verbal / Phone Orders: No Diagnosis Coding Discharge From [facility] o Discharge from [place] a Memory Foam cushion to sit on while you are at dialysis o you can get this from [place] or [place] or [place] o just ask a sales associate to help you find something Get up and walk around as much as possible. Do not sit in any chair or your  recliner for more than 2 hours at a time, then get up and walk. When sitting, move your bottom around and shift your weight so you are not getting pressure on any area for too long. Electronic Signature(s) Signed: 09/20/2018 6:09:48 PM By: Worthy Keeler PA-C Signed: 09/23/2018 3:24:13 PM By: Gretta Cool BSN, RN, CWS, Kim RN, BSN Entered By: Gretta Cool, BSN, RN, CWS, Kim on 09/20/2018 13:48:52 Cory Miller ([id number]) -------------------------------------------------------------------------------- Problem List Details Patient Name: Cory Miller Date of Service: 09/20/2018 1:00 PM Medical Record Number: [id number] Patient Account Number: [id number] Date of Birth/Sex: 05/30/1941 (76 y.o. M) Treating RN: Montey Hora Primary Care Provider: [name] Other Clinician: Referring Provider: Referral, Self Treating Provider/Extender: Melburn Hake, HOYT Weeks in Treatment: 0 Active Problems ICD-10 Evaluated Encounter Code Description Active Date Today Diagnosis L98.8 Other specified disorders of the skin and subcutaneous 09/20/2018 No Yes tissue E11.622 Type 2 diabetes mellitus with other skin ulcer 09/20/2018 No Yes H91.93 Unspecified hearing loss, bilateral 09/20/2018 No Yes G47.30 Sleep apnea, unspecified 09/20/2018 No Yes N18.4 Chronic kidney disease, stage 4 (severe) 09/20/2018 No Yes Inactive Problems Resolved Problems Electronic Signature(s) Signed: 09/20/2018 6:09:48 PM By: Worthy Keeler PA-C Entered By: Worthy Keeler on 09/20/2018 18:03:48 Cory Miller ([id number]) -------------------------------------------------------------------------------- Progress Note Details Patient Name: Cory Miller Date of Service: 09/20/2018 1:00 PM Medical Record Number: [id number] Patient Account Number: [id number] Date of Birth/Sex: February 07, 1941 (76 y.o. M) Treating RN: Montey Hora Primary Care Provider: [name] Other Clinician: Referring Provider: Referral,  Self Treating Provider/Extender: Melburn Hake, HOYT Weeks in Treatment: 0 Subjective Chief Complaint Information obtained from Patient Bilateral gluteal wounds History of Present Illness (HPI) 09/20/18 on evaluation today patient presents with a history of having several significant medical problems. This includes stroke, myocardial infarction, heart bypass surgery, significant hearing loss, sleep apnea, and he is a former smoker along with being diabetic. He is very close to stage IV possibly in stage renal disease and is prepping at this point for dialysis. Fortunately there does not appear to be active issues despite the patient's significant medical history  he is able to ambulate today although slowly. With that being said he does have two areas on his gluteal area one right and one left which actually appear to be completely healed today. Nonetheless at times this will become very painful according to what the patient is telling me. His sister who was present during evaluation though not in the room for the majority of it states that he intermittently complains about pain and they were under the assumption that this was going to need to be lanced and opened up as there was infection underline. With that being said I do not see any evidence of infection at this time and in fact it does not even appear to be open wound currently. I do think she's prone to and possibly if he sits for too long of a period of time in his chair as he tells me he sometimes does he may indeed have areas that will open in this regard. Nonetheless at this point there is no opening. Wound History Patient reportedly has not tested positive for osteomyelitis. Patient History Information obtained from Patient. Allergies No Known Drug Allergies Family History No family history of Cancer, Diabetes, Heart Disease, Hypertension, Kidney Disease, Lung Disease, Stroke, Thyroid Problems. Social History Former smoker,  Marital Status - Single, Alcohol Use - Rarely, Drug Use - No History, Caffeine Use - Daily. Medical History Respiratory Patient has history of Chronic Obstructive Pulmonary Disease (COPD), Sleep Apnea Cardiovascular Patient has history of Coronary Artery Disease, Hypertension, Myocardial Infarction Gastrointestinal Denies history of Cirrhosis , Colitis, Crohn s, Hepatitis A, Hepatitis B, Hepatitis C Endocrine Patient has history of Type II Diabetes Denies history of Type I Diabetes Cory Miller, Cory Miller ([id number]) Genitourinary Patient has history of End Stage Renal Disease Immunological Denies history of Lupus Erythematosus, Raynaud s, Scleroderma Integumentary (Skin) Denies history of History of Burn, History of pressure wounds Musculoskeletal Patient has history of Gout Denies history of Rheumatoid Arthritis, Osteoarthritis, Osteomyelitis Neurologic Denies history of Dementia, Neuropathy, Quadriplegia, Paraplegia, Seizure Disorder Oncologic Denies history of Received Chemotherapy, Received Radiation Patient is treated with Insulin. Blood sugar is tested. Blood sugar results noted at the following times: Breakfast - 129. Medical And Surgical History Notes Cardiovascular Carotid Stenosis, Oncologic Prostate Review of Systems (ROS) Constitutional Symptoms (General Health) The patient has no complaints or symptoms. Eyes The patient has no complaints or symptoms. Ear/Nose/Mouth/Throat The patient has no complaints or symptoms. Cardiovascular The patient has no complaints or symptoms. Gastrointestinal Complains or has symptoms of Frequent diarrhea. Denies complaints or symptoms of Nausea, Vomiting. Endocrine The patient has no complaints or symptoms. Genitourinary Denies complaints or symptoms of Kidney failure/ Dialysis, CKD Stage V Immunological The patient has no complaints or symptoms. Integumentary (Skin) Complains or has symptoms of Wounds, Bleeding or bruising  tendency. Denies complaints or symptoms of Breakdown, Swelling. Musculoskeletal The patient has no complaints or symptoms. Neurologic The patient has no complaints or symptoms. Oncologic The patient has no complaints or symptoms. Psychiatric The patient has no complaints or symptoms. Cory Miller, Cory Miller ([id number]) Objective Constitutional patient is hypertensive.. pulse regular and within target range for patient.[name] respirations regular, non-labored and within target range for patient.[name] temperature within target range for patient.. Well-nourished and well-hydrated in no acute distress. Vitals Time Taken: 1:29 PM, Height: 73 in, Weight: 255 lbs, BMI: 33.6, Temperature: 98.4 F, Pulse: 81 bpm, Respiratory Rate: 20 breaths/min, Blood Pressure: 148/51 mmHg. General Notes: Patient arrived with portable O2 tank on 2 O2. Taken off upon arrival to the room before  getting into the bed. Patient states he only needs O2 when active. Eyes conjunctiva clear no eyelid edema noted. pupils equal round and reactive to light and accommodation. Ears, Nose, Mouth, and Throat no gross abnormality of ear auricles or external auditory canals. normal hearing noted during conversation. mucus membranes moist. Respiratory normal breathing without difficulty. clear to auscultation bilaterally. Cardiovascular regular rate and rhythm with normal S1, S2. no clubbing, cyanosis, significant edema, Gastrointestinal (GI) soft, non-tender, non-distended, +BS. no ventral hernia noted. Musculoskeletal unsteady while walking but ambulated with his oxygen to stabilize him.[name] Psychiatric this patient is able to make decisions and demonstrates good insight into disease process. Alert and Oriented x 3. pleasant and cooperative. General Notes: Patient's skin examination revealed no ulcerated openings on the gluteal region and I did look over the area extensively to ensure that I was not missing anything at this point. With that  being said there does not appear to be any evidence of infection either and definitely no sign of an abscess. I feel like that he does have some scarring secondary to the fact that he has had ulcerations recently but again they are not active at this point. 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 The skin appearance did not exhibit: Atrophie Blanche, Callus, Crepitus, Cyanosis, Dry/Scaly, Ecchymosis, Erythema, Excoriation, Friable, Hemosiderin Staining, Induration, Maceration, Mottled, Pallor, Rash, Rubor, Scarring. Skin temperature was noted as No Abnormality. 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 Do not sit in any chair or your recliner for more than 2 hours at a time, then get up and walk. When sitting, move your bottom around and shift your weight so you are not getting pressure on any area for too long. As best I could I did have an extensive conversation with the patient concerning offloading. His hearing loss made this very difficult and he had a very difficult time understanding in general what I was telling him. Nonetheless I did relay some of this to his sister as well and we also wrote down for him the instruction so that he could read this to follow going forward. We also recommended for him a cushion to help  with offloading as well especially if he begins dialysis. Patient seem to be thankful for the information although he was surprised to hear that he did not have any wound open in the gluteal region. We will see him back for reevaluation depending on how things proceed and if he has any concerns or questions meantime he will contact the office and let me know. Electronic Signature(s) Signed: 09/20/2018 6:09:48 PM By: Worthy Keeler PA-C Entered By: Worthy Keeler on 09/20/2018 18:08:55 Cory Miller ([id number]) -------------------------------------------------------------------------------- ROS/PFSH Details Patient Name: Cory Miller Date of Service: 09/20/2018 1:00 PM Medical Record Number: [id number] Patient Account Number: [id number] Date of Birth/Sex: Sep 26, 1941 (76 y.o. M) Treating RN: Cornell Barman Primary Care Provider: [name]  Other Clinician: Referring Provider: Referral, Self Treating Provider/Extender: STONE III, HOYT Weeks in Treatment: 0 Information Obtained From Patient Wound History Do you currently have one or more open woundso Yes Approximately how long have you had your woundso 2 years How have you been treating your wound(s) until nowo patches Has your wound(s) ever healed and then re-openedo No Have you had any lab work done in the past montho No Have you tested positive for an antibiotic resistant organism (MRSA, VRE)o No Have you tested positive for osteomyelitis (bone infection)o No Gastrointestinal Complaints and Symptoms: Positive for: Frequent diarrhea Negative for: Nausea; Vomiting Medical History: Negative for: Cirrhosis ; Colitis; Crohnos; Hepatitis A; Hepatitis B; Hepatitis C Genitourinary Complaints and Symptoms: Negative for: Kidney failure/ Dialysis Review of System Notes: CKD Stage V Medical History: Positive for: End Stage Renal Disease Integumentary (Skin) Complaints and Symptoms: Positive for: Wounds; Bleeding or bruising  tendency Negative for: Breakdown; Swelling Medical History: Negative for: History of Burn; History of pressure wounds Constitutional Symptoms (General Health) Complaints and Symptoms: No Complaints or Symptoms Eyes Complaints and Symptoms: No Complaints or Symptoms Cory Miller, Cory Miller ([id number]) Ear/Nose/Mouth/Throat Complaints and Symptoms: No Complaints or Symptoms Respiratory Medical History: Positive for: Chronic Obstructive Pulmonary Disease (COPD); Sleep Apnea Cardiovascular Complaints and Symptoms: No Complaints or Symptoms Medical History: Positive for: Coronary Artery Disease; Hypertension; Myocardial Infarction Past Medical History Notes: Carotid Stenosis, Endocrine Complaints and Symptoms: No Complaints or Symptoms Medical History: Positive for: Type II Diabetes Negative for: Type I Diabetes Time with diabetes: 15 years Treated with: Insulin Blood sugar tested every day: Yes Tested : Blood sugar testing results: Breakfast: 129 Immunological Complaints and Symptoms: No Complaints or Symptoms Medical History: Negative for: Lupus Erythematosus; Raynaudos; Scleroderma Musculoskeletal Complaints and Symptoms: No Complaints or Symptoms Medical History: Positive for: Gout Negative for: Rheumatoid Arthritis; Osteoarthritis; Osteomyelitis Neurologic Complaints and Symptoms: No Complaints or Symptoms Medical History: Negative for: Dementia; Neuropathy; Quadriplegia; Paraplegia; Seizure Disorder Cory Miller, Cory Miller ([id number]) Oncologic Complaints and Symptoms: No Complaints or Symptoms Medical History: Negative for: Received Chemotherapy; Received Radiation Past Medical History Notes: Prostate Psychiatric Complaints and Symptoms: No Complaints or Symptoms Immunizations Pneumococcal Vaccine: Received Pneumococcal Vaccination: Yes Implantable Devices Family and Social History Cancer: No; Diabetes: No; Heart Disease: No; Hypertension: No; Kidney Disease:  No; Lung Disease: No; Stroke: No; Thyroid Problems: No; Former smoker; Marital Status - Single; Alcohol Use: Rarely; Drug Use: No History; Caffeine Use: Daily; Financial Concerns: No; Food, Clothing or Shelter Needs: No; Support System Lacking: No; Transportation Concerns: No; Advanced Directives: No; Living Will: No Electronic Signature(s) Signed: 09/20/2018 6:09:48 PM By: Worthy Keeler PA-C Signed: 09/23/2018 3:24:13 PM By: Gretta Cool, BSN, RN, CWS, Kim RN, BSN Entered By: Gretta Cool, BSN, RN, CWS, Kim on 09/20/2018 13:27:52 Cory Miller ([id number]) -------------------------------------------------------------------------------- SuperBill Details Patient Name: Cory Miller Date of Service: 09/20/2018 Medical Record Number: [id number] Patient Account Number: [id number] Date of Birth/Sex: 06-28-1941 (76 y.o. M) Treating RN: Montey Hora Primary Care Provider: [name] Other Clinician: Referring Provider: Referral, Self Treating Provider/Extender: Melburn Hake, HOYT Weeks in Treatment: 0 Diagnosis Coding ICD-10 Codes Code Description L98.8 Other specified disorders of the skin and subcutaneous tissue E11.622 Type 2 diabetes mellitus with other skin ulcer H91.93 Unspecified hearing loss, bilateral G47.30 Sleep apnea, unspecified N18.4 Chronic kidney disease, stage 4 (severe) Facility Procedures CPT4 Code: [id number] Description: 99213 - WOUND CARE VISIT-LEV 3 EST PT Modifier: Quantity: 1 Physician Procedures CPT4 Code: 3244010 Description: WC PHYS LEVEL 3 o NEW PT ICD-10 Diagnosis Description L98.8 Other specified disorders  of the skin and subcutaneous E11.622 Type 2 diabetes mellitus with other skin ulcer H91.93 Unspecified hearing loss, bilateral G47.30 Sleep apnea,  unspecified Modifier: tissue Quantity: 1 Electronic Signature(s) Signed: 09/20/2018 6:09:48 PM By: Worthy Keeler PA-C Entered By: Worthy Keeler on 09/20/2018 18:09:08

## 2018-09-24 NOTE — Progress Notes (Signed)
Cory Miller, Cory Miller ([id number]) Visit Report for 09/20/2018 Allergy List Details Patient Name: Cory Miller Date of Service: 09/20/2018 1:00 PM Medical Record Number: [id number] Patient Account Number: [id number] Date of Birth/Sex: June 23, 1941 (76 y.o. M) Treating RN: Cornell Barman Primary Care Saidi Santacroce: [name] Other Clinician: Referring Carrolyn Hilmes: Referral, Self Treating Kayleigh Broadwell/Extender: Melburn Hake, HOYT Weeks in Treatment: 0 Allergies Active Allergies No Known Drug Allergies Allergy Notes Electronic Signature(s) Signed: 09/23/2018 3:24:13 PM By: Gretta Cool, BSN, RN, CWS, Kim RN, BSN Entered By: Gretta Cool, BSN, RN, CWS, Kim on 09/20/2018 13:20:11 Cory Miller ([id number]) -------------------------------------------------------------------------------- Arrival Information Details Patient Name: Cory Miller Date of Service: 09/20/2018 1:00 PM Medical Record Number: [id number] Patient Account Number: [id number] Date of Birth/Sex: September 12, 1941 (76 y.o. M) Treating RN: Cornell Barman Primary Care Kayleana Waites: [name] Other Clinician: Referring Jola Critzer: Referral, Self Treating Janaysia Mcleroy/Extender: Melburn Hake, HOYT Weeks in Treatment: 0 Visit Information Patient Arrived: Ambulatory Arrival Time: 13:14 Accompanied By: self Transfer Assistance: None Patient Identification Verified: Yes Secondary Verification Process Yes Completed: Patient Has Alerts: Yes Patient Alerts: Patient on Blood Thinner Type II Diabetic Plavix Electronic Signature(s) Signed: 09/23/2018 3:24:13 PM By: Gretta Cool, BSN, RN, CWS, Kim RN, BSN Entered By: Gretta Cool, BSN, RN, CWS, Kim on 09/20/2018 13:18:22 Cory Miller ([id number]) -------------------------------------------------------------------------------- Clinic Level of Care Assessment Details Patient Name: Cory Miller Date of Service: 09/20/2018 1:00 PM Medical Record Number: [id number] Patient Account Number: [id number] Date of Birth/Sex:  1941-11-09 (76 y.o. M) Treating RN: Montey Hora Primary Care Chloey Ricard: [name] Other Clinician: Referring Kawthar Ennen: Referral, Self Treating Dermot Gremillion/Extender: STONE III, HOYT Weeks in Treatment: 0 Clinic Level of Care Assessment Items TOOL 2 Quantity Score []  - Use when only an EandM is performed on the INITIAL visit 0 ASSESSMENTS - Nursing Assessment / Reassessment X - General Physical Exam (combine w/ comprehensive assessment (listed just below) when 1 20 performed on new pt. evals) X- 1 25 Comprehensive Assessment (HX, ROS, Risk Assessments, Wounds Hx, etc.) ASSESSMENTS - Wound and Skin Assessment / Reassessment []  - Simple Wound Assessment / Reassessment - one wound 0 []  - 0 Complex Wound Assessment / Reassessment - multiple wounds X- 1 10 Dermatologic / Skin Assessment (not related to wound area) ASSESSMENTS - Ostomy and/or Continence Assessment and Care []  - Incontinence Assessment and Management 0 []  - 0 Ostomy Care Assessment and Management (repouching, etc.) PROCESS - Coordination of Care X - Simple Patient / Family Education for ongoing care 1 15 []  - 0 Complex (extensive) Patient / Family Education for ongoing care []  - 0 Staff obtains Programmer, systems, Records, Test Results / Process Orders []  - 0 Staff telephones HHA, Nursing Homes / Clarify orders / etc []  - 0 Routine Transfer to another Facility (non-emergent condition) []  - 0 Routine Hospital Admission (non-emergent condition) X- 1 15 New Admissions / Biomedical engineer / Ordering NPWT, Apligraf, etc. []  - 0 Emergency Hospital Admission (emergent condition) X- 1 10 Simple Discharge Coordination []  - 0 Complex (extensive) Discharge Coordination PROCESS - Special Needs []  - Pediatric / Minor Patient Management 0 []  - 0 Isolation Patient Management Cory Miller, Cory Miller ([id number]) []  - 0 Hearing / Language / Visual special needs []  - 0 Assessment of Community assistance (transportation, D/C  planning, etc.) []  - 0 Additional assistance / Altered mentation X- 1 15 Support Surface(s) Assessment (bed, cushion, seat, etc.) INTERVENTIONS - Wound Cleansing / Measurement X - Wound Imaging (photographs - any number of wounds) 1 5 []  - 0 Wound Tracing (instead of photographs) []  - 0 Simple Wound Measurement - one wound []  -  0 Complex Wound Measurement - multiple wounds []  - 0 Simple Wound Cleansing - one wound []  - 0 Complex Wound Cleansing - multiple wounds INTERVENTIONS - Wound Dressings []  - Small Wound Dressing one or multiple wounds 0 []  - 0 Medium Wound Dressing one or multiple wounds []  - 0 Large Wound Dressing one or multiple wounds []  - 0 Application of Medications - injection INTERVENTIONS - Miscellaneous []  - External ear exam 0 []  - 0 Specimen Collection (cultures, biopsies, blood, body fluids, etc.) []  - 0 Specimen(s) / Culture(s) sent or taken to Lab for analysis []  - 0 Patient Transfer (multiple staff / Civil Service fast streamer / Similar devices) []  - 0 Simple Staple / Suture removal (25 or less) []  - 0 Complex Staple / Suture removal (26 or more) []  - 0 Hypo / Hyperglycemic Management (close monitor of Blood Glucose) []  - 0 Ankle / Brachial Index (ABI) - do not check if billed separately Has the patient been seen at the hospital within the last three years: Yes Total Score: 115 Level Of Care: New/Established - Level 3 Electronic Signature(s) Signed: 09/20/2018 5:35:44 PM By: Montey Hora Entered By: Montey Hora on 09/20/2018 14:07:00 Cory Miller ([id number]) -------------------------------------------------------------------------------- Encounter Discharge Information Details Patient Name: Cory Miller Date of Service: 09/20/2018 1:00 PM Medical Record Number: [id number] Patient Account Number: [id number] Date of Birth/Sex: 01/08/1941 (77 y.o. M) Treating RN: Montey Hora Primary Care Audel Coakley: [name] Other Clinician: Referring  Tattianna Schnarr: Referral, Self Treating Janifer Gieselman/Extender: Melburn Hake, HOYT Weeks in Treatment: 0 Encounter Discharge Information Items Discharge Condition: Stable Ambulatory Status: Cane Discharge Destination: Home Transportation: Private Auto Accompanied By: self Schedule Follow-up Appointment: No Clinical Summary of Care: Electronic Signature(s) Signed: 09/20/2018 5:35:44 PM By: Montey Hora Entered By: Montey Hora on 09/20/2018 14:09:13 Cory Miller ([id number]) -------------------------------------------------------------------------------- Lower Extremity Assessment Details Patient Name: Cory Miller Date of Service: 09/20/2018 1:00 PM Medical Record Number: [id number] Patient Account Number: [id number] Date of Birth/Sex: Jun 20, 1941 (76 y.o. M) Treating RN: Cornell Barman Primary Care Dominique Ressel: [name] Other Clinician: Referring Jemia Fata: Referral, Self Treating Lenay Lovejoy/Extender: Melburn Hake, HOYT Weeks in Treatment: 0 Electronic Signature(s) Signed: 09/23/2018 3:24:13 PM By: Gretta Cool, BSN, RN, CWS, Kim RN, BSN Entered By: Gretta Cool, BSN, RN, CWS, Kim on 09/20/2018 13:39:30 Cory Miller ([id number]) -------------------------------------------------------------------------------- Non-Wound Condition Assessment Details Patient Name: Cory Miller Date of Service: 09/20/2018 1:00 PM Medical Record Number: [id number] Patient Account Number: [id number] Date of Birth/Sex: 1941-04-29 (76 y.o. M) Treating RN: Montey Hora Primary Care Brixton Franko: [name] Other Clinician: Referring Verbena Boeding: Referral, Self Treating Birdie Fetty/Extender: STONE III, HOYT Weeks in Treatment: 0 Non-Wound Condition: Condition: Scar / Keloid Location: Gluteus Side: Bilateral Periwound Skin Texture Texture Color No Abnormalities Noted: No No Abnormalities Noted: No Callus: No Atrophie Blanche: No Crepitus: No Cyanosis: No Excoriation: No Ecchymosis: No Friable: No Erythema:  No Induration: No Hemosiderin Staining: No Rash: No Mottled: No Scarring: No Pallor: No Rubor: No Moisture No Abnormalities Noted: No Temperature / Pain Dry / Scaly: No Temperature: No Abnormality Maceration: No Notes patient with 2 areas that are scarred healed pressure ulcers from appearance Electronic Signature(s) Signed: 09/20/2018 5:35:44 PM By: Montey Hora Entered By: Montey Hora on 09/20/2018 13:27:19 Cory Miller ([id number]) -------------------------------------------------------------------------------- Pain Assessment Details Patient Name: Cory Miller Date of Service: 09/20/2018 1:00 PM Medical Record Number: [id number] Patient Account Number: [id number] Date of Birth/Sex: Apr 02, 1941 (76 y.o. M) Treating RN: Cornell Barman Primary Care Eagle Pitta: [name] Other Clinician: Referring Ranie Chinchilla: Referral, Self Treating Herschel Fleagle/Extender: STONE III, HOYT Weeks in Treatment: 0 Active  Problems Location of Pain Severity and Description of Pain Patient Has Paino No Site Locations Pain Management and Medication Current Pain Management: Electronic Signature(s) Signed: 09/23/2018 3:24:13 PM By: Gretta Cool, BSN, RN, CWS, Kim RN, BSN Entered By: Gretta Cool, BSN, RN, CWS, Kim on 09/20/2018 13:18:53 Cory Miller ([id number]) -------------------------------------------------------------------------------- Patient/Caregiver Education Details Patient Name: Cory Miller Date of Service: 09/20/2018 1:00 PM Medical Record Number: [id number] Patient Account Number: [id number] Date of Birth/Gender: 1941/09/27 (76 y.o. M) Treating RN: Montey Hora Primary Care Physician: [name] Other Clinician: Referring Physician: Referral, Self Treating Physician/Extender: Sharalyn Ink in Treatment: 0 Education Assessment Education Provided To: Patient and Caregiver Education Topics Provided Offloading: Handouts: Other: offloading written  instructions Methods: Printed Responses: State content correctly Electronic Signature(s) Signed: 09/20/2018 5:35:44 PM By: Montey Hora Entered By: Montey Hora on 09/20/2018 14:07:22 Cory Miller ([id number]) -------------------------------------------------------------------------------- Vitals Details Patient Name: Cory Miller Date of Service: 09/20/2018 1:00 PM Medical Record Number: [id number] Patient Account Number: [id number] Date of Birth/Sex: [birth year] (76 y.o. M) Treating RN: Cornell Barman Primary Care Kesa Birky: [name] Other Clinician: Referring Farhaan Mabee: Referral, Self Treating Evalynn Hankins/Extender: STONE III, HOYT Weeks in Treatment: 0 Vital Signs Time Taken: 13:29 Temperature (F): 98.4 Height (in): 73 Pulse (bpm): 81 Weight (lbs): 255 Respiratory Rate (breaths/min): 20 Body Mass Index (BMI): 33.6 Blood Pressure (mmHg): 148/51 Reference Range: 80 - 120 mg / dl Notes Patient arrived with portable O2 tank on 2 O2. Taken off upon arrival to the room before getting into the bed. Patient states he only needs O2 when active. Electronic Signature(s) Signed: 09/23/2018 3:24:13 PM By: Gretta Cool, BSN, RN, CWS, Kim RN, BSN Entered By: Gretta Cool, BSN, RN, CWS, Kim on 09/20/2018 13:31:32

## 2018-09-24 NOTE — Progress Notes (Signed)
KAYRON, HICKLIN ([id number]) Visit Report for 09/20/2018 Abuse/Suicide Risk Screen Details Patient Name: Cory Miller, Cory Miller Date of Service: 09/20/2018 1:00 PM Medical Record Number: [id number] Patient Account Number: [id number] Date of Birth/Sex: [birth year] (76 y.o. M) Treating RN: Cornell Barman Primary Care Domnic Vantol: [name] Other Clinician: Referring Yaden Seith: Referral, Self Treating Kaveh Kissinger/Extender: STONE III, HOYT Weeks in Treatment: 0 Abuse/Suicide Risk Screen Items Answer ABUSE/SUICIDE RISK SCREEN: Has anyone close to you tried to hurt or harm you recentlyo No Do you feel uncomfortable with anyone in your familyo No Has anyone forced you do things that you didnot want to doo No Do you have any thoughts of harming yourselfo No Patient displays signs or symptoms of abuse and/or neglect. No Electronic Signature(s) Signed: 09/23/2018 3:24:13 PM By: Gretta Cool, BSN, RN, CWS, Kim RN, BSN Entered By: Gretta Cool, BSN, RN, CWS, Kim on 09/20/2018 13:27:58 Cory Miller ([id number]) -------------------------------------------------------------------------------- Activities of Daily Living Details Patient Name: Cory Miller Date of Service: 09/20/2018 1:00 PM Medical Record Number: [id number] Patient Account Number: [id number] Date of Birth/Sex: 02/03/1941 (76 y.o. M) Treating RN: Cornell Barman Primary Care Katiejo Gilroy: [name] Other Clinician: Referring Melburn Treiber: Referral, Self Treating Olina Melfi/Extender: STONE III, HOYT Weeks in Treatment: 0 Activities of Daily Living Items Answer Activities of Daily Living (Please select one for each item) Drive Automobile Completely Able Take Medications Completely Able Use Telephone Completely Able Care for Appearance Completely Able Use Toilet Completely Able Bath / Shower Completely Able Dress Self Completely Able Feed Self Completely Able Walk Completely Able Get In / Out Bed Completely Able Housework Completely Able Prepare  Meals Completely [place] for Self Completely Able Electronic Signature(s) Signed: 09/23/2018 3:24:13 PM By: Gretta Cool, BSN, RN, CWS, Kim RN, BSN Entered By: Gretta Cool, BSN, RN, CWS, Kim on 09/20/2018 13:28:18 Cory Miller ([id number]) -------------------------------------------------------------------------------- Education Assessment Details Patient Name: Cory Miller Date of Service: 09/20/2018 1:00 PM Medical Record Number: [id number] Patient Account Number: [id number] Date of Birth/Sex: September 09, 1941 (76 y.o. M) Treating RN: Cornell Barman Primary Care Roxanne Panek: [name] Other Clinician: Referring Lessie Funderburke: Referral, Self Treating Larry Knipp/Extender: Melburn Hake, HOYT Weeks in Treatment: 0 Primary Learner Assessed: Patient Learning Preferences/Education Level/Primary Language Learning Preference: Explanation Highest Education Level: High School Cognitive Barrier Assessment/Beliefs Language Barrier: No Translator Needed: No Memory Deficit: No Emotional Barrier: No Cultural/Religious Beliefs Affecting Medical Care: No Physical Barrier Assessment Impaired Vision: No Impaired Hearing: Yes VERY hard of hearing Decreased Hand dexterity: No Knowledge/Comprehension Assessment Knowledge Level: Medium Comprehension Level: Medium Ability to understand written Medium instructions: Ability to understand verbal Medium instructions: Motivation Assessment Anxiety Level: Calm Cooperation: Cooperative Education Importance: Acknowledges Need Interest in Health Problems: Asks Questions Perception: Confused Willingness to Engage in Self- Medium Management Activities: Readiness to Engage in Self- Medium Management Activities: Electronic Signature(s) Signed: 09/23/2018 3:24:13 PM By: Gretta Cool, BSN, RN, CWS, Kim RN, BSN Entered By: Gretta Cool, BSN, RN, CWS, Kim on 09/20/2018 13:29:05 Cory Miller  ([id number]) -------------------------------------------------------------------------------- Fall Risk Assessment Details Patient Name: Cory Miller Date of Service: 09/20/2018 1:00 PM Medical Record Number: [id number] Patient Account Number: [id number] Date of Birth/Sex: 1941/08/24 (76 y.o. M) Treating RN: Cornell Barman Primary Care Akif Weldy: [name] Other Clinician: Referring Ryson Bacha: Referral, Self Treating Epic Tribbett/Extender: Melburn Hake, HOYT Weeks in Treatment: 0 Fall Risk Assessment Items Have you had 2 or more falls in the last 12 monthso 0 No Have you had any fall that resulted in injury in the last 12 monthso 0 No FALL RISK ASSESSMENT: History of falling - immediate or within 3 months 0  No Secondary diagnosis 0 No Ambulatory aid None/bed rest/wheelchair/nurse 0 Yes Crutches/cane/walker 0 No Furniture 0 No IV Access/Saline Lock 0 No Gait/Training Normal/bed rest/immobile 0 Yes Weak 0 No Impaired 0 No Mental Status Oriented to own ability 0 Yes Electronic Signature(s) Signed: 09/23/2018 3:24:13 PM By: Gretta Cool, BSN, RN, CWS, Kim RN, BSN Entered By: Gretta Cool, BSN, RN, CWS, Kim on 09/20/2018 13:29:20 Cory Miller ([id number]) -------------------------------------------------------------------------------- Foot Assessment Details Patient Name: Cory Miller Date of Service: 09/20/2018 1:00 PM Medical Record Number: [id number] Patient Account Number: [id number] Date of Birth/Sex: 1940-12-29 (76 y.o. M) Treating RN: Cornell Barman Primary Care Achsah Mcquade: [name] Other Clinician: Referring Bird Tailor: Referral, Self Treating Dymond Spreen/Extender: Melburn Hake, HOYT Weeks in Treatment: 0 Foot Assessment Items Site Locations + = Sensation present, - = Sensation absent, C = Callus, U = Ulcer R = Redness, W = Warmth, M = Maceration, PU = Pre-ulcerative lesion F = Fissure, S = Swelling, D = Dryness Assessment Right: Left: Other Deformity: No No Prior Foot Ulcer:  No No Prior Amputation: No No Charcot Joint: No No Ambulatory Status: Ambulatory Without Help Gait: Steady Electronic Signature(s) Signed: 09/23/2018 3:24:13 PM By: Gretta Cool, BSN, RN, CWS, Kim RN, BSN Entered By: Gretta Cool, BSN, RN, CWS, Kim on 09/20/2018 13:29:35 Cory Miller ([id number]) -------------------------------------------------------------------------------- Nutrition Risk Assessment Details Patient Name: Cory Miller Date of Service: 09/20/2018 1:00 PM Medical Record Number: [id number] Patient Account Number: [id number] Date of Birth/Sex: 07-21-1941 (76 y.o. M) Treating RN: Cornell Barman Primary Care Treyven Lafauci: [name] Other Clinician: Referring Meztli Llanas: Referral, Self Treating Preslyn Warr/Extender: STONE III, HOYT Weeks in Treatment: 0 Height (in): Weight (lbs): Body Mass Index (BMI): Nutrition Risk Assessment Items NUTRITION RISK SCREEN: I have an illness or condition that made me change the kind and/or amount of 0 No food I eat I eat fewer than two meals per day 0 No I eat few fruits and vegetables, or milk products 0 No I have three or more drinks of beer, liquor or wine almost every day 0 No I have tooth or mouth problems that make it hard for me to eat 0 No I don't always have enough money to buy the food I need 0 No I eat alone most of the time 0 No I take three or more different prescribed or over-the-counter drugs a day 0 No Without wanting to, I have lost or gained 10 pounds in the last six months 0 No I am not always physically able to shop, cook and/or feed myself 0 No Nutrition Protocols Good Risk Protocol Provide education on elevated blood sugars and Moderate Risk Protocol 0 impact on wound healing, as applicable Electronic Signature(s) Signed: 09/23/2018 3:24:13 PM By: Gretta Cool, BSN, RN, CWS, Kim RN, BSN Entered By: Gretta Cool, BSN, RN, CWS, Kim on 09/20/2018 70:78:67

## 2018-10-17 ENCOUNTER — Emergency Department: Payer: Medicare PPO

## 2018-10-17 ENCOUNTER — Other Ambulatory Visit: Payer: Self-pay

## 2018-10-17 ENCOUNTER — Inpatient Hospital Stay
Admission: EM | Admit: 2018-10-17 | Discharge: 2018-10-23 | DRG: 189 | Disposition: A | Discharge status: Home Health | Payer: Medicare PPO | Attending: Internal Medicine | Admitting: Internal Medicine

## 2018-10-17 ENCOUNTER — Encounter: Payer: Self-pay | Admitting: Emergency Medicine

## 2018-10-17 DIAGNOSIS — J9811 Atelectasis: Secondary | ICD-10-CM | POA: Diagnosis present

## 2018-10-17 DIAGNOSIS — D631 Anemia in chronic kidney disease: Secondary | ICD-10-CM | POA: Diagnosis present

## 2018-10-17 DIAGNOSIS — I2510 Atherosclerotic heart disease of native coronary artery without angina pectoris: Secondary | ICD-10-CM | POA: Diagnosis present

## 2018-10-17 DIAGNOSIS — K219 Gastro-esophageal reflux disease without esophagitis: Secondary | ICD-10-CM | POA: Diagnosis present

## 2018-10-17 DIAGNOSIS — E782 Mixed hyperlipidemia: Secondary | ICD-10-CM | POA: Diagnosis present

## 2018-10-17 DIAGNOSIS — I248 Other forms of acute ischemic heart disease: Secondary | ICD-10-CM | POA: Diagnosis present

## 2018-10-17 DIAGNOSIS — G4733 Obstructive sleep apnea (adult) (pediatric): Secondary | ICD-10-CM | POA: Diagnosis present

## 2018-10-17 DIAGNOSIS — Z9861 Coronary angioplasty status: Secondary | ICD-10-CM

## 2018-10-17 DIAGNOSIS — Z8673 Personal history of transient ischemic attack (TIA), and cerebral infarction without residual deficits: Secondary | ICD-10-CM

## 2018-10-17 DIAGNOSIS — N2581 Secondary hyperparathyroidism of renal origin: Secondary | ICD-10-CM | POA: Diagnosis present

## 2018-10-17 DIAGNOSIS — Z79899 Other long term (current) drug therapy: Secondary | ICD-10-CM

## 2018-10-17 DIAGNOSIS — J189 Pneumonia, unspecified organism: Secondary | ICD-10-CM | POA: Diagnosis present

## 2018-10-17 DIAGNOSIS — G4730 Sleep apnea, unspecified: Secondary | ICD-10-CM | POA: Diagnosis present

## 2018-10-17 DIAGNOSIS — I132 Hypertensive heart and chronic kidney disease with heart failure and with stage 5 chronic kidney disease, or end stage renal disease: Secondary | ICD-10-CM | POA: Diagnosis present

## 2018-10-17 DIAGNOSIS — D759 Disease of blood and blood-forming organs, unspecified: Secondary | ICD-10-CM | POA: Diagnosis present

## 2018-10-17 DIAGNOSIS — H9190 Unspecified hearing loss, unspecified ear: Secondary | ICD-10-CM | POA: Diagnosis present

## 2018-10-17 DIAGNOSIS — E1122 Type 2 diabetes mellitus with diabetic chronic kidney disease: Secondary | ICD-10-CM | POA: Diagnosis present

## 2018-10-17 DIAGNOSIS — I6523 Occlusion and stenosis of bilateral carotid arteries: Secondary | ICD-10-CM | POA: Diagnosis present

## 2018-10-17 DIAGNOSIS — I25810 Atherosclerosis of coronary artery bypass graft(s) without angina pectoris: Secondary | ICD-10-CM | POA: Diagnosis present

## 2018-10-17 DIAGNOSIS — J9601 Acute respiratory failure with hypoxia: Secondary | ICD-10-CM | POA: Diagnosis not present

## 2018-10-17 DIAGNOSIS — Z794 Long term (current) use of insulin: Secondary | ICD-10-CM

## 2018-10-17 DIAGNOSIS — Z8719 Personal history of other diseases of the digestive system: Secondary | ICD-10-CM

## 2018-10-17 DIAGNOSIS — J9621 Acute and chronic respiratory failure with hypoxia: Principal | ICD-10-CM | POA: Diagnosis present

## 2018-10-17 DIAGNOSIS — I5033 Acute on chronic diastolic (congestive) heart failure: Secondary | ICD-10-CM | POA: Diagnosis present

## 2018-10-17 DIAGNOSIS — N186 End stage renal disease: Secondary | ICD-10-CM | POA: Diagnosis present

## 2018-10-17 DIAGNOSIS — E1151 Type 2 diabetes mellitus with diabetic peripheral angiopathy without gangrene: Secondary | ICD-10-CM | POA: Diagnosis present

## 2018-10-17 DIAGNOSIS — Z9981 Dependence on supplemental oxygen: Secondary | ICD-10-CM

## 2018-10-17 DIAGNOSIS — I252 Old myocardial infarction: Secondary | ICD-10-CM | POA: Diagnosis not present

## 2018-10-17 DIAGNOSIS — R079 Chest pain, unspecified: Secondary | ICD-10-CM | POA: Diagnosis not present

## 2018-10-17 DIAGNOSIS — E1140 Type 2 diabetes mellitus with diabetic neuropathy, unspecified: Secondary | ICD-10-CM | POA: Diagnosis present

## 2018-10-17 DIAGNOSIS — Z79891 Long term (current) use of opiate analgesic: Secondary | ICD-10-CM

## 2018-10-17 DIAGNOSIS — Z7982 Long term (current) use of aspirin: Secondary | ICD-10-CM

## 2018-10-17 DIAGNOSIS — N529 Male erectile dysfunction, unspecified: Secondary | ICD-10-CM | POA: Diagnosis present

## 2018-10-17 DIAGNOSIS — N179 Acute kidney failure, unspecified: Secondary | ICD-10-CM | POA: Diagnosis present

## 2018-10-17 DIAGNOSIS — R0602 Shortness of breath: Secondary | ICD-10-CM | POA: Diagnosis present

## 2018-10-17 DIAGNOSIS — Z7902 Long term (current) use of antithrombotics/antiplatelets: Secondary | ICD-10-CM

## 2018-10-17 DIAGNOSIS — J810 Acute pulmonary edema: Secondary | ICD-10-CM

## 2018-10-17 DIAGNOSIS — J9600 Acute respiratory failure, unspecified whether with hypoxia or hypercapnia: Secondary | ICD-10-CM

## 2018-10-17 DIAGNOSIS — M109 Gout, unspecified: Secondary | ICD-10-CM | POA: Diagnosis present

## 2018-10-17 DIAGNOSIS — Z87891 Personal history of nicotine dependence: Secondary | ICD-10-CM

## 2018-10-17 DIAGNOSIS — J9690 Respiratory failure, unspecified, unspecified whether with hypoxia or hypercapnia: Secondary | ICD-10-CM | POA: Diagnosis present

## 2018-10-17 DIAGNOSIS — Z992 Dependence on renal dialysis: Secondary | ICD-10-CM

## 2018-10-17 DIAGNOSIS — Z8546 Personal history of malignant neoplasm of prostate: Secondary | ICD-10-CM

## 2018-10-17 LAB — BLOOD GAS, ARTERIAL
Acid-Base Excess: 5.3 mmol/L — ABNORMAL HIGH (ref 0.0–2.0)
Bicarbonate: 21.2 mmol/L (ref 20.0–28.0)
Delivery systems: POSITIVE
EXPIRATORY PAP: 6
FIO2: 0.4
Inspiratory PAP: 16
O2 SAT: 96 %
Patient temperature: 37
pCO2 arterial: 44 mmHg (ref 32.0–48.0)
pH, Arterial: 7.29 — ABNORMAL LOW (ref 7.350–7.450)
pO2, Arterial: 91 mmHg (ref 83.0–108.0)

## 2018-10-17 LAB — CBC
HCT: 24.8 % — ABNORMAL LOW (ref 39.0–52.0)
HEMOGLOBIN: 7.7 g/dL — AB (ref 13.0–17.0)
MCH: 29.2 pg (ref 26.0–34.0)
MCHC: 31 g/dL (ref 30.0–36.0)
MCV: 93.9 fL (ref 80.0–100.0)
Platelets: 206 10*3/uL (ref 150–400)
RBC: 2.64 MIL/uL — ABNORMAL LOW (ref 4.22–5.81)
RDW: 16.2 % — AB (ref 11.5–15.5)
WBC: 7.5 10*3/uL (ref 4.0–10.5)
nRBC: 0 % (ref 0.0–0.2)

## 2018-10-17 LAB — BASIC METABOLIC PANEL
Anion gap: 9 (ref 5–15)
BUN: 59 mg/dL — ABNORMAL HIGH (ref 8–23)
CO2: 23 mmol/L (ref 22–32)
CREATININE: 3.47 mg/dL — AB (ref 0.61–1.24)
Calcium: 8 mg/dL — ABNORMAL LOW (ref 8.9–10.3)
Chloride: 111 mmol/L (ref 98–111)
GFR calc Af Amer: 18 mL/min — ABNORMAL LOW (ref >60)
GFR calc non Af Amer: 16 mL/min — ABNORMAL LOW (ref >60)
Glucose, Bld: 112 mg/dL — ABNORMAL HIGH (ref 70–99)
POTASSIUM: 4.7 mmol/L (ref 3.5–5.1)
Sodium: 143 mmol/L (ref 135–145)

## 2018-10-17 LAB — TROPONIN I: TROPONIN I: 0.03 ng/mL — AB (ref <0.03)

## 2018-10-17 LAB — GLUCOSE, CAPILLARY: Glucose-Capillary: 90 mg/dL (ref 70–99)

## 2018-10-17 LAB — MRSA PCR SCREENING: MRSA by PCR: NEGATIVE

## 2018-10-17 MED ORDER — DORZOLAMIDE HCL-TIMOLOL MAL 2-0.5 % OP SOLN
1.0000 [drp] | Freq: Two times a day (BID) | OPHTHALMIC | Status: DC
  Administered 2018-10-17 – 2018-10-23 (×10): 1 [drp] via OPHTHALMIC
  Filled 2018-10-17 (×3): qty 10

## 2018-10-17 MED ORDER — SODIUM CHLORIDE 0.9 % IV SOLN: 250.0000 mL | INTRAVENOUS | Status: DC | PRN

## 2018-10-17 MED ORDER — COD LIVER OIL PO CAPS: 1.0000 | ORAL_CAPSULE | Freq: Every day | ORAL | Status: DC

## 2018-10-17 MED ORDER — FLUTICASONE PROPIONATE 50 MCG/ACT NA SUSP
1.0000 | Freq: Every day | NASAL | Status: DC | PRN
  Filled 2018-10-17: qty 16

## 2018-10-17 MED ORDER — CLONIDINE HCL 0.1 MG PO TABS
0.1000 mg | ORAL_TABLET | Freq: Three times a day (TID) | ORAL | Status: DC
  Administered 2018-10-17 – 2018-10-23 (×14): 0.1 mg via ORAL
  Filled 2018-10-17 (×15): qty 1

## 2018-10-17 MED ORDER — HYDRALAZINE HCL 50 MG PO TABS
100.0000 mg | ORAL_TABLET | Freq: Three times a day (TID) | ORAL | Status: DC
  Administered 2018-10-17 – 2018-10-23 (×14): 100 mg via ORAL
  Filled 2018-10-17 (×15): qty 2

## 2018-10-17 MED ORDER — AMLODIPINE BESYLATE 10 MG PO TABS
10.0000 mg | ORAL_TABLET | Freq: Every day | ORAL | Status: DC
  Administered 2018-10-18 – 2018-10-23 (×6): 10 mg via ORAL
  Filled 2018-10-17 (×2): qty 1
  Filled 2018-10-17: qty 2
  Filled 2018-10-17: qty 1
  Filled 2018-10-17: qty 2
  Filled 2018-10-17: qty 1

## 2018-10-17 MED ORDER — INSULIN ASPART 100 UNIT/ML ~~LOC~~ SOLN
0.0000 [IU] | Freq: Three times a day (TID) | SUBCUTANEOUS | Status: DC
  Administered 2018-10-18: 5 [IU] via SUBCUTANEOUS
  Administered 2018-10-18: 1 [IU] via SUBCUTANEOUS
  Administered 2018-10-18: 2 [IU] via SUBCUTANEOUS
  Administered 2018-10-19: 1 [IU] via SUBCUTANEOUS
  Administered 2018-10-20: 3 [IU] via SUBCUTANEOUS
  Administered 2018-10-20: 1 [IU] via SUBCUTANEOUS
  Administered 2018-10-21: 3 [IU] via SUBCUTANEOUS
  Administered 2018-10-21: 2 [IU] via SUBCUTANEOUS
  Administered 2018-10-22: 3 [IU] via SUBCUTANEOUS
  Administered 2018-10-22: 2 [IU] via SUBCUTANEOUS
  Administered 2018-10-22: 5 [IU] via SUBCUTANEOUS
  Administered 2018-10-23: 2 [IU] via SUBCUTANEOUS
  Filled 2018-10-17 (×12): qty 1

## 2018-10-17 MED ORDER — FUROSEMIDE 10 MG/ML IJ SOLN
40.0000 mg | Freq: Two times a day (BID) | INTRAMUSCULAR | Status: DC
  Administered 2018-10-18: 40 mg via INTRAVENOUS
  Filled 2018-10-17: qty 4

## 2018-10-17 MED ORDER — HYDROCODONE-ACETAMINOPHEN 5-325 MG PO TABS: 1.0000 | ORAL_TABLET | ORAL | Status: DC | PRN

## 2018-10-17 MED ORDER — HYDROXYZINE HCL 25 MG PO TABS
25.0000 mg | ORAL_TABLET | Freq: Four times a day (QID) | ORAL | Status: DC | PRN
  Administered 2018-10-17 – 2018-10-19 (×2): 25 mg via ORAL
  Filled 2018-10-17 (×3): qty 1

## 2018-10-17 MED ORDER — FUROSEMIDE 10 MG/ML IJ SOLN
60.0000 mg | Freq: Once | INTRAMUSCULAR | Status: AC
  Administered 2018-10-17: 60 mg via INTRAVENOUS
  Filled 2018-10-17: qty 8

## 2018-10-17 MED ORDER — ENALAPRIL MALEATE 2.5 MG PO TABS
2.5000 mg | ORAL_TABLET | Freq: Every day | ORAL | Status: DC
  Administered 2018-10-18 – 2018-10-23 (×6): 2.5 mg via ORAL
  Filled 2018-10-17 (×6): qty 1

## 2018-10-17 MED ORDER — SODIUM CHLORIDE 0.9% FLUSH: 3.0000 mL | INTRAVENOUS | Status: DC | PRN

## 2018-10-17 MED ORDER — ALPRAZOLAM 0.5 MG PO TABS
0.5000 mg | ORAL_TABLET | Freq: Two times a day (BID) | ORAL | Status: DC | PRN
  Administered 2018-10-17 – 2018-10-19 (×2): 0.5 mg via ORAL
  Filled 2018-10-17 (×2): qty 1

## 2018-10-17 MED ORDER — VITAMIN D3 25 MCG (1000 UNIT) PO TABS
1000.0000 [IU] | ORAL_TABLET | Freq: Every day | ORAL | Status: DC
  Administered 2018-10-18 – 2018-10-23 (×6): 1000 [IU] via ORAL
  Filled 2018-10-17 (×7): qty 1

## 2018-10-17 MED ORDER — ASPIRIN EC 81 MG PO TBEC
81.0000 mg | DELAYED_RELEASE_TABLET | Freq: Every day | ORAL | Status: DC
  Administered 2018-10-18 – 2018-10-23 (×6): 81 mg via ORAL
  Filled 2018-10-17 (×6): qty 1

## 2018-10-17 MED ORDER — ONDANSETRON HCL 4 MG/2ML IJ SOLN: 4.0000 mg | Freq: Four times a day (QID) | INTRAMUSCULAR | Status: DC | PRN

## 2018-10-17 MED ORDER — ACETAMINOPHEN 650 MG RE SUPP: 650.0000 mg | Freq: Four times a day (QID) | RECTAL | Status: DC | PRN

## 2018-10-17 MED ORDER — POLYETHYLENE GLYCOL 3350 17 G PO PACK
17.0000 g | PACK | Freq: Every day | ORAL | Status: DC | PRN
  Administered 2018-10-20: 17 g via ORAL

## 2018-10-17 MED ORDER — HEPARIN SODIUM (PORCINE) 5000 UNIT/ML IJ SOLN
5000.0000 [IU] | Freq: Three times a day (TID) | INTRAMUSCULAR | Status: DC
  Administered 2018-10-17 – 2018-10-23 (×16): 5000 [IU] via SUBCUTANEOUS
  Filled 2018-10-17 (×16): qty 1

## 2018-10-17 MED ORDER — COLCHICINE 0.6 MG PO TABS
0.6000 mg | ORAL_TABLET | ORAL | Status: DC
  Administered 2018-10-19 – 2018-10-23 (×3): 0.6 mg via ORAL
  Filled 2018-10-17 (×3): qty 1

## 2018-10-17 MED ORDER — SIMETHICONE 80 MG PO CHEW
160.0000 mg | CHEWABLE_TABLET | Freq: Four times a day (QID) | ORAL | Status: DC | PRN
  Administered 2018-10-18 – 2018-10-22 (×5): 160 mg via ORAL
  Filled 2018-10-17 (×7): qty 2

## 2018-10-17 MED ORDER — INSULIN DETEMIR 100 UNIT/ML ~~LOC~~ SOLN
10.0000 [IU] | Freq: Every day | SUBCUTANEOUS | Status: DC
  Administered 2018-10-17 – 2018-10-22 (×6): 10 [IU] via SUBCUTANEOUS
  Filled 2018-10-17 (×8): qty 0.1

## 2018-10-17 MED ORDER — PANTOPRAZOLE SODIUM 40 MG PO TBEC
40.0000 mg | DELAYED_RELEASE_TABLET | Freq: Two times a day (BID) | ORAL | Status: DC
  Administered 2018-10-17 – 2018-10-23 (×12): 40 mg via ORAL
  Filled 2018-10-17 (×12): qty 1

## 2018-10-17 MED ORDER — FLAX SEED OIL 1000 MG PO CAPS: 1300.0000 mg | ORAL_CAPSULE | Freq: Every day | ORAL | Status: DC

## 2018-10-17 MED ORDER — ISOSORBIDE MONONITRATE ER 60 MG PO TB24
120.0000 mg | ORAL_TABLET | Freq: Every day | ORAL | Status: DC
  Administered 2018-10-18 – 2018-10-23 (×6): 120 mg via ORAL
  Filled 2018-10-17 (×6): qty 2

## 2018-10-17 MED ORDER — DOCUSATE SODIUM 100 MG PO CAPS
100.0000 mg | ORAL_CAPSULE | Freq: Two times a day (BID) | ORAL | Status: DC
  Administered 2018-10-17 – 2018-10-23 (×12): 100 mg via ORAL
  Filled 2018-10-17 (×12): qty 1

## 2018-10-17 MED ORDER — ATORVASTATIN CALCIUM 20 MG PO TABS
40.0000 mg | ORAL_TABLET | Freq: Every day | ORAL | Status: DC
  Administered 2018-10-17 – 2018-10-22 (×6): 40 mg via ORAL
  Filled 2018-10-17 (×6): qty 2

## 2018-10-17 MED ORDER — OXYCODONE-ACETAMINOPHEN 5-325 MG PO TABS
1.0000 | ORAL_TABLET | Freq: Three times a day (TID) | ORAL | Status: DC
  Administered 2018-10-17 – 2018-10-20 (×8): 1 via ORAL
  Filled 2018-10-17 (×8): qty 1

## 2018-10-17 MED ORDER — SODIUM CHLORIDE 0.9% FLUSH
3.0000 mL | Freq: Two times a day (BID) | INTRAVENOUS | Status: DC
  Administered 2018-10-17 – 2018-10-23 (×10): 3 mL via INTRAVENOUS

## 2018-10-17 MED ORDER — ALLOPURINOL 100 MG PO TABS
100.0000 mg | ORAL_TABLET | Freq: Every day | ORAL | Status: DC
  Administered 2018-10-18 – 2018-10-23 (×6): 100 mg via ORAL
  Filled 2018-10-17 (×6): qty 1

## 2018-10-17 MED ORDER — SODIUM POLYSTYRENE SULFONATE 15 GM/60ML PO SUSP: 15.0000 g | ORAL | Status: DC

## 2018-10-17 MED ORDER — ONDANSETRON HCL 4 MG PO TABS: 4.0000 mg | ORAL_TABLET | Freq: Four times a day (QID) | ORAL | Status: DC | PRN

## 2018-10-17 MED ORDER — CLOPIDOGREL BISULFATE 75 MG PO TABS
75.0000 mg | ORAL_TABLET | Freq: Every day | ORAL | Status: DC
  Administered 2018-10-18 – 2018-10-23 (×6): 75 mg via ORAL
  Filled 2018-10-17 (×6): qty 1

## 2018-10-17 MED ORDER — CARVEDILOL 12.5 MG PO TABS
12.5000 mg | ORAL_TABLET | Freq: Two times a day (BID) | ORAL | Status: DC
  Administered 2018-10-18 – 2018-10-22 (×8): 12.5 mg via ORAL
  Filled 2018-10-17 (×4): qty 1
  Filled 2018-10-17: qty 2
  Filled 2018-10-17 (×3): qty 1

## 2018-10-17 MED ORDER — ACETAMINOPHEN 325 MG PO TABS: 650.0000 mg | ORAL_TABLET | Freq: Four times a day (QID) | ORAL | Status: DC | PRN

## 2018-10-17 NOTE — ED Notes (Signed)
First Nurse Note: Patient here with sister, complaining of CP and SHOB.  Alert, HOH, color good, skin warm and dry.  Taken to Rm 2 for EKG,  on oxygen - changed to hospital tank at 2L.

## 2018-10-17 NOTE — Progress Notes (Addendum)
[facility]  ROUNDING NOTE   Subjective:   [name] admitted to [facility] on 10/17/2018 for chest pain  Patient found to have pulmonary edema, shortness of breath and chest pain. Placed on BIPAP.   Brother at bedside. Patient is unable to give much history.   Objective:  Vital signs in last 24 hours:  Temp:  [97.7 F (36.5 C)] 97.7 F (36.5 C) (11/21 1107) Pulse Rate:  [74-118] 118 (11/21 1354) Resp:  [25-35] 35 (11/21 1354) BP: (123-143)/(30-62) 143/55 (11/21 1300) SpO2:  [98 %-100 %] 99 % (11/21 1354)  Weight change:  There were no vitals filed for this visit.  Intake/Output: No intake/output data recorded.   Intake/Output this shift:  Total I/O In: -  Out: 120 [Urine:120]  Physical Exam: General: Critically ill  Head: +BIPAP  Eyes: Anicteric, PERRL  Neck: Supple, trachea midline  Lungs:  diminished bilaterally, +BIPAP  Heart: Regular rate and rhythm  Abdomen:  Soft, nontender,   Extremities:  + peripheral edema.  Neurologic: Nonfocal, moving all four extremities  Skin: No lesions  Access: Left AVF +thrill +bruit    Basic Metabolic Panel: Recent Labs  Lab 10/17/18 1154  NA 143  K 4.7  CL 111  CO2 23  GLUCOSE 112*  BUN 59*  CREATININE 3.47*  CALCIUM 8.0*    Liver Function Tests: No results for input(s): AST, ALT, ALKPHOS, BILITOT, PROT, ALBUMIN in the last 168 hours. No results for input(s): LIPASE, AMYLASE in the last 168 hours. No results for input(s): AMMONIA in the last 168 hours.  CBC: Recent Labs  Lab 10/17/18 1154  WBC 7.5  HGB 7.7*  HCT 24.8*  MCV 93.9  PLT 206    Cardiac Enzymes: Recent Labs  Lab 10/17/18 1154  TROPONINI 0.03*    BNP: Invalid input(s): POCBNP  CBG: No results for input(s): GLUCAP in the last 168 hours.  Microbiology: Results for orders placed or performed during the hospital encounter of 08/22/18  MRSA PCR Screening     Status: None   Collection Time: 08/22/18  3:55 PM  Result  Value Ref Range Status   MRSA by PCR NEGATIVE NEGATIVE Final    Comment:        The GeneXpert MRSA Assay (FDA approved for NASAL specimens only), is one component of a comprehensive MRSA colonization surveillance program. It is not intended to diagnose MRSA infection nor to guide or monitor treatment for MRSA infections. Performed at [facility], [address] [place], [place] [address]     Coagulation Studies: No results for input(s): LABPROT, INR in the last 72 hours.  Urinalysis: No results for input(s): COLORURINE, LABSPEC, PHURINE, GLUCOSEU, HGBUR, BILIRUBINUR, KETONESUR, PROTEINUR, UROBILINOGEN, NITRITE, LEUKOCYTESUR in the last 72 hours.  Invalid input(s): APPERANCEUR    Imaging: Dg Chest 2 View  Result Date: 10/17/2018 CLINICAL DATA:  Shortness of breath, generalized chest pain EXAM: CHEST - 2 VIEW COMPARISON:  08/26/2018 FINDINGS: Prior CABG. Cardiomegaly with vascular congestion and mild bilateral airspace opacities, likely edema/CHF. Suspect small layering effusions. No acute bony abnormality. Linear atelectasis at the right lung base. IMPRESSION: Cardiomegaly with vascular congestion and probable mild pulmonary edema. Bilateral layering small effusions. Electronically Signed   By: Rolm Baptise M.D.   On: 10/17/2018 12:11     Medications:   . sodium chloride     . allopurinol  100 mg Oral Daily  . amLODipine  10 mg Oral Daily  . aspirin  81 mg Oral Daily  .  atorvastatin  40 mg Oral QHS  . carvedilol  12.5 mg Oral BID WC  . cholecalciferol  1,000 Units Oral Daily  . cloNIDine  0.1 mg Oral TID  . clopidogrel  75 mg Oral Daily  . Cod Liver Oil  1 capsule Oral Daily  . colchicine  0.6 mg Oral QODAY  . docusate sodium  100 mg Oral BID  . dorzolamide-timolol  1 drop Both Eyes BID  . enalapril  2.5 mg Oral Daily  . Flax Seed Oil  1,300 mg Oral Daily  . furosemide  40 mg Intravenous BID  . heparin  5,000 Units Subcutaneous Q8H  . hydrALAZINE  100  mg Oral TID  . insulin aspart  0-9 Units Subcutaneous TID WC  . insulin detemir  10 Units Subcutaneous Daily  . isosorbide mononitrate  120 mg Oral Daily  . oxyCODONE-acetaminophen  1 tablet Oral TID  . pantoprazole  40 mg Oral BID  . sodium chloride flush  3 mL Intravenous Q12H  . sodium polystyrene  15 g Oral UD   sodium chloride, acetaminophen **OR** acetaminophen, fluticasone, HYDROcodone-acetaminophen, hydrOXYzine, ondansetron **OR** ondansetron (ZOFRAN) IV, polyethylene glycol, simethicone, sodium chloride flush  Assessment/ Plan:  [name] is a 77 y.o. black male with chronic kidney disease stage V, hypertension, peripheral vascular disease, obstructive sleep apnea, carotid stenosis, coronary artery disease status post CABG, hyperlipidemia, gout, who presents with chest pain.   1. 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 Baseline creatinine of 4.03, GFR of 15 on 08/27/18.  2. Acute pulmonary edema: with echocardiogram on 08/23/18 showing preserved systolic function 3. Hypertension 4. Anemia of renal failure: hemoglobin 7.7, normocytic.   Plan Agree with Noninvasive ventilation.  Started on IV furosemide 40mg  q12 - if no improvement, consider furosemide gtt Low threshold to start renal replacement therapy. Discussed with family. AVF seems mature.    LOS: 0 Davonna Ertl 11/21/20192:29 PM

## 2018-10-17 NOTE — Progress Notes (Signed)
Pt was transported to CCU from ED while on the bipap. 

## 2018-10-17 NOTE — ED Notes (Signed)
Bell. Patients sister/caregiver

## 2018-10-17 NOTE — ED Notes (Signed)
ICU secretary reports they are holding room 7 as a code room and cannot take patient at this time. Charge Nurse Annie Main made aware

## 2018-10-17 NOTE — ED Notes (Signed)
Patient transported to X-ray 

## 2018-10-17 NOTE — ED Notes (Signed)
First Nurse Note: Patient to Rm 15, Tom RN aware of placement and systolic BP.

## 2018-10-17 NOTE — Consult Note (Signed)
Name: Cory Miller MRN: [id number] DOB: [birth year]    ADMISSION DATE:  10/17/2018 CONSULTATION DATE: 10/17/2018  REFERRING MD : Dr. 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  He presented to [facility] ER on 11/21 with c/o shortness of breath and central chest tightness onset of symptoms 3 days prior to presentation.  Initial EKG revealed normal sinus rhythm, hr 85, left axis deviation, no ST elevation. Lab results showed glucose 112, BUN 59, creatinine 3.47, calcium 8.0, troponin 0.03, and hgb 7.7. CXR revealed cardiomegaly with pulmonary edema, previous Echo on 08/23/18 showed preserved systolic function.  He has a hx of CKD-stage V and had a right upper extremity AV fistula placed on 06/14/18, he has not started dialysis yet. On 08/08/18 pt followed up with vascular surgery for repeat US of AV fistula to assess readiness for use.  However, ultrasound results revealed worsening stricture of his mid fistula, per vascular pt would require angiogram prior to use. Repeat outpatient duplex ultrasound of dialysis access pending on 09/2018.  During current ER visit he received 60 mg iv lasix x1 dose and was initially on 2L O2 via nasal canula. However, he developed increased work of breathing requiring Bipap.  He was subsequently admitted to the stepdown unit for additional workup and treatment.   PAST MEDICAL HISTORY :   has a past  medical history of Blood dyscrasia, CAD (coronary artery disease) of artery bypass graft, Carotid atherosclerosis, Carotid stenosis, Chronic kidney disease, CKD (chronic kidney disease), stage V ([place]), CVA (cerebrovascular accident) ([place]), Diabetes mellitus without complication ([place]), Dyspnea, ED (erectile dysfunction), Elevated troponin I level, Esophageal ulcer, GERD (gastroesophageal reflux disease), Gout, Hard of hearing, Hyperlipidemia, Hypertension, MI (myocardial infarction) ([place]), Neuropathy, Occlusion of both internal carotid arteries, Prostate CA ([place]), and Sleep apnea.  has a past surgical history that includes Arterial bypass surgry; Esophagogastroduodenoscopy (N/A, 08/05/2015); Esophagogastroduodenoscopy (egd) with propofol (N/A, 09/30/2015); Coronary artery bypass graft; Coronary angioplasty; ostate cryoablation; Carotid endarterectomy (Left); partial thoracoscopy (6/03); decortication,parietal pleurectomy (Left, 05/2002); chest tubes x 2; Colonoscopy with propofol (N/A, 06/02/2016); Colonoscopy with propofol (N/A, 07/28/2016); and AV fistula placement (Right, 06/14/2018). Prior to Admission medications   Medication Sig Start Date End Date Taking? Authorizing Provider  allopurinol (ZYLOPRIM) 100 MG tablet Take 100 mg by mouth daily.    [provider]  amLODipine (NORVASC) 10 MG tablet Take 10 mg by mouth daily.    [provider]  aspirin 81 MG tablet Take 81 mg by mouth daily.    [provider]  atorvastatin (LIPITOR) 40 MG tablet Take 40 mg by mouth at bedtime.    [provider]  carvedilol (COREG) 12.5 MG tablet Take 12.5 mg by mouth 2 (two) times daily with a meal.  09/25/17   [provider]  cholecalciferol (VITAMIN D) 1000 units tablet Take 1,000 Units by mouth daily.     [provider]  cloNIDine (CATAPRES) 0.1 MG tablet Take 1 tablet (0.1 mg total)  by mouth 3 (three) times daily. 08/27/18   Vaughan Basta, MD  clopidogrel  (PLAVIX) 75 MG tablet Take 75 mg by mouth daily.    [provider]  [facility] Liver Oil CAPS Take 1 capsule by mouth daily.     [provider]  colchicine 0.6 MG tablet Take 0.6 mg by mouth every other day.     [provider]  docusate sodium (COLACE) 100 MG capsule Take 100 mg by mouth 2 (two) times daily.    [provider]  dorzolamide-timolol (COSOPT) 22.3-6.8 MG/ML ophthalmic solution Place 1 drop into both eyes 2 (two) times daily.  02/28/18   [provider]  enalapril (VASOTEC) 2.5 MG tablet Take 1 tablet (2.5 mg total) by mouth daily. 08/28/18   Vaughan Basta, MD  Flaxseed, Linseed, (FLAX SEED OIL) 1000 MG CAPS Take 1,300 mg by mouth daily.     [provider]  fluticasone (FLONASE) 50 MCG/ACT nasal spray Place 1 spray into both nostrils daily as needed for allergies.     [provider]  hydrALAZINE (APRESOLINE) 100 MG tablet Take 100 mg by mouth 3 (three) times daily.     [provider]  hydrOXYzine (ATARAX/VISTARIL) 25 MG tablet Take 25 mg by mouth 4 (four) times daily as needed for itching.     [provider]  insulin aspart (NOVOLOG) 100 UNIT/ML FlexPen Inject 10 Units into the skin 3 (three) times daily with meals.     [provider]  insulin detemir (LEVEMIR) 100 UNIT/ML injection Inject 0.1 mLs (10 Units total) into the skin daily. 08/28/18   Vaughan Basta, MD  isosorbide mononitrate (IMDUR) 120 MG 24 hr tablet Take 120 mg by mouth daily.    [provider]  ketoconazole (NIZORAL) 2 % shampoo Apply 1 application topically 2 (two) times a week.  11/30/17   [provider]  oxyCODONE-acetaminophen (PERCOCET/ROXICET) 5-325 MG per tablet Take 1 tablet by mouth 3 (three) times daily.    [provider]  pantoprazole (PROTONIX) 40 MG tablet Take 1 tablet (40 mg total) by mouth daily. 40 mg BID for 1 month followed by once daily Patient taking differently: Take 40  mg by mouth 2 (two) times daily.  08/06/15   Max Sane, MD  simethicone (MYLICON) 80 MG chewable tablet Chew 2 tablets (160 mg total) by mouth 4 (four) times daily as needed for flatulence. 08/27/18   Vaughan Basta, MD  SPS 15 GM/60ML suspension Take 60 mLs by mouth as directed. When eating potassium rich foods 06/05/18   [provider]  torsemide (DEMADEX) 20 MG tablet Take 3 tablets (60 mg total) by mouth daily. 08/28/18   Vaughan Basta, MD  triamcinolone cream (KENALOG) 0.1 % Apply 1 application topically 2 (two) times daily as needed (ITCHING).     [provider]   No Known Allergies  FAMILY HISTORY:  family history includes CAD in his unknown relative; Cancer in his unknown relative; Diabetes in his unknown relative; Stroke in his unknown relative. SOCIAL HISTORY:  reports that he has quit smoking. He has never used smokeless tobacco. He reports that he drinks alcohol. He reports that he does not use drugs.  REVIEW OF SYSTEMS:   Unable to assess pt in respiratory distress on Bipap   SUBJECTIVE:  Unable to assess pt in respiratory distress on Bipap   VITAL SIGNS: Temp:  [97.7 F (36.5 C)] 97.7 F (36.5 C) (11/21 1107) Pulse Rate:  [74-85] 74 (11/21 1300) Resp:  [  25-28] 28 (11/21 1300) BP: (123-143)/(30-62) 143/55 (11/21 1300) SpO2:  [98 %-100 %] 100 % (11/21 1300)  PHYSICAL EXAMINATION: General: acutely ill appearing male, in respiratory distress on Bipap  Neuro: alert and oriented, follows commands HEENT: supple, JVD present  Cardiovascular: sinus tach, no R/G Lungs: crackles bilateral bases, labored and tachypneic post exertion  Abdomen: +BS x4, obese, soft, non tender, non distended  Musculoskeletal: 2+bilateral lower extremities, moves all extremities Skin: intact no rashes or lesions present   Recent Labs  Lab 10/17/18 1154  NA 143  K 4.7  CL 111  CO2 23  BUN 59*  CREATININE 3.47*  GLUCOSE 112*   Recent Labs  Lab  10/17/18 1154  HGB 7.7*  HCT 24.8*  WBC 7.5  PLT 206   Dg Chest 2 View  Result Date: 10/17/2018 CLINICAL DATA:  Shortness of breath, generalized chest pain EXAM: CHEST - 2 VIEW COMPARISON:  08/26/2018 FINDINGS: Prior CABG. Cardiomegaly with vascular congestion and mild bilateral airspace opacities, likely edema/CHF. Suspect small layering effusions. No acute bony abnormality. Linear atelectasis at the right lung base. IMPRESSION: Cardiomegaly with vascular congestion and probable mild pulmonary edema. Bilateral layering small effusions. Electronically Signed   By: Rolm Baptise M.D.   On: 10/17/2018 12:11    ASSESSMENT / PLAN:  Acute on chronic respiratory failure secondary to pulmonary edema  Hx: OSA Supplemental O2 for dyspnea and/or hypoxia  Prn bronchodilator therapy Continue iv lasix 40 mg bid if respiratory status does not improve will start lasix gtt  Repeat CXR in am   Hypertension  Mildly elevated troponin likely demand ischemia in setting of respiratory failure  Hx: CAD, Hyperlipidemia, CVA, and Carotid stenosis  Continuous telemetry monitoring Trend troponin's Once able to tolerate po's continue outpatient amlodipine, aspirin, atorvastatin, carvedilol, clonidine, plavix, hydralazine, and imdur   Echo pending   Acute on chronic renal failure (CKD Stage V) Trend BMP  Replace electrolytes as indicated  Monitor UOP Avoid nephrotoxic medications Nephrology consulted appreciate input   Anemia of chronic kidney disease  VTE px: subq heparin  Trend CBC  Monitor for s/sx of bleeding and transfuse for hgb <7  GERD  SUP px: protonix   Type II Diabetes Mellitus SSI  Continue scheduled levemir   Marda Stalker, [place] Pager [phone] (please enter 7 digits) PCCM Consult Pager [phone] (please enter 7 digits)

## 2018-10-17 NOTE — H&P (Signed)
[place] [facility] [place] NAME: Cory Miller    MR#:  [id number]  DATE OF BIRTH:  05/30/1941  DATE OF ADMISSION:  10/17/2018  PRIMARY CARE PHYSICIAN: [name], MD   REQUESTING/REFERRING PHYSICIAN: *dr Archie Balboa  CHIEF COMPLAINT:   SOB HISTORY OF PRESENT ILLNESS:  Cory Miller  is a 77 y.o. 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  In the emergency room chest x-ray shows pulmonary edema.  He had an echocardiogram in September which shows normal ejection fraction and no mention of diastolic heart failure.  He has stage V renal disease and may be approaching dialysis.  While in the ER patient went to stand up to urinate and became very dyspneic. He will be placed on BiPAP.  PAST MEDICAL HISTORY:   Past Medical History:  Diagnosis Date  . Blood dyscrasia    prostate  . CAD (coronary artery disease) of artery bypass graft   . Carotid atherosclerosis   . Carotid stenosis   . Chronic kidney disease   . CKD (chronic kidney disease), stage V ([place])   . CVA (cerebrovascular accident) ([place])   . Diabetes mellitus without complication ([place])   . Dyspnea   . ED (erectile dysfunction)   . Elevated troponin I level   . Esophageal ulcer   . GERD (gastroesophageal reflux disease)   . Gout   . Hard of hearing    Pt very hard  of hearing  . Hyperlipidemia   . Hypertension   . MI (myocardial infarction) ([place])   . Neuropathy   . Occlusion of both internal carotid arteries   . Prostate CA ([place])   . Sleep apnea     PAST SURGICAL HISTORY:   Past Surgical History:  Procedure Laterality Date  . ARTERIAL BYPASS SURGRY    . AV FISTULA PLACEMENT Right 06/14/2018   Procedure: ARTERIOVENOUS (AV) FISTULA CREATION(brachiocephalic);  Surgeon: Katha Cabal, MD;  Location: [facility] ORS;  Service: Vascular;  Laterality:  Right;  . CAROTID ENDARTERECTOMY Left   . chest tubes x 2     for pneumothorax  . COLONOSCOPY WITH PROPOFOL N/A 06/02/2016   Procedure: COLONOSCOPY WITH PROPOFOL;  Surgeon: Lollie Sails, MD;  Location: [facility] ENDOSCOPY;  Service: Endoscopy;  Laterality: N/A;  . COLONOSCOPY WITH PROPOFOL N/A 07/28/2016   Procedure: COLONOSCOPY WITH PROPOFOL;  Surgeon: Lollie Sails, MD;  Location: [facility] ENDOSCOPY;  Service: Endoscopy;  Laterality: N/A;  . CORONARY ANGIOPLASTY    . CORONARY ARTERY BYPASS GRAFT    . decortication,parietal pleurectomy Left 05/2002  . ESOPHAGOGASTRODUODENOSCOPY N/A 08/05/2015   Procedure: ESOPHAGOGASTRODUODENOSCOPY (EGD);  Surgeon: Hulen Luster, MD;  Location[facility] ENDOSCOPY;  Service: Endoscopy;  Laterality: N/A;  . ESOPHAGOGASTRODUODENOSCOPY (EGD) WITH PROPOFOL N/A 09/30/2015   Procedure: ESOPHAGOGASTRODUODENOSCOPY (EGD) WITH PROPOFOL;  Surgeon: Hulen Luster, MD;  Location: [facility] ENDOSCOPY;  Service: Gastroenterology;  Laterality: N/A;  . ostate cryoablation    . partial thoracoscopy  6/03    SOCIAL HISTORY:   Social History   Tobacco Use  . Smoking status: Former Research scientist (life sciences)  . Smokeless tobacco: Never Used  Substance Use Topics  . Alcohol use: Yes    Alcohol/week: 0.0 standard drinks    FAMILY HISTORY:   Family History  Problem Relation Age of Onset  . CAD Unknown   . Diabetes Unknown   .  Cancer Unknown   . Stroke Unknown     DRUG ALLERGIES:  No Known Allergies  REVIEW OF SYSTEMS:   Review of Systems  Constitutional: Negative.  Negative for chills, fever and malaise/fatigue.  HENT: Negative.  Negative for ear discharge, ear pain, hearing loss, nosebleeds and sore throat.   Eyes: Negative.  Negative for blurred vision and pain.  Respiratory: Positive for shortness of breath. Negative for cough, hemoptysis and wheezing.   Cardiovascular: Positive for orthopnea and PND. Negative for chest pain, palpitations and leg swelling.  Gastrointestinal: Negative.  Negative  for abdominal pain, blood in stool, diarrhea, nausea and vomiting.  Genitourinary: Negative.  Negative for dysuria.  Musculoskeletal: Negative.  Negative for back pain.  Skin: Negative.   Neurological: Negative for dizziness, tremors, speech change, focal weakness, seizures and headaches.  Endo/Heme/Allergies: Negative.  Does not bruise/bleed easily.  Psychiatric/Behavioral: Negative.  Negative for depression, hallucinations and suicidal ideas.    MEDICATIONS AT HOME:   Prior to Admission medications   Medication Sig Start Date End Date Taking? Authorizing Provider  allopurinol (ZYLOPRIM) 100 MG tablet Take 100 mg by mouth daily.    [provider]  amLODipine (NORVASC) 10 MG tablet Take 10 mg by mouth daily.    [provider]  aspirin 81 MG tablet Take 81 mg by mouth daily.    [provider]  atorvastatin (LIPITOR) 40 MG tablet Take 40 mg by mouth at bedtime.    [provider]  carvedilol (COREG) 12.5 MG tablet Take 12.5 mg by mouth 2 (two) times daily with a meal.  09/25/17   [provider]  cholecalciferol (VITAMIN D) 1000 units tablet Take 1,000 Units by mouth daily.     [provider]  cloNIDine (CATAPRES) 0.1 MG tablet Take 1 tablet (0.1 mg total) by mouth 3 (three) times daily. 08/27/18   Vaughan Basta, MD  clopidogrel (PLAVIX) 75 MG tablet Take 75 mg by mouth daily.    [provider]  [facility] Liver Oil CAPS Take 1 capsule by mouth daily.     [provider]  colchicine 0.6 MG tablet Take 0.6 mg by mouth every other day.     [provider]  docusate sodium (COLACE) 100 MG capsule Take 100 mg by mouth 2 (two) times daily.    [provider]  dorzolamide-timolol (COSOPT) 22.3-6.8 MG/ML ophthalmic solution Place 1 drop into both eyes 2 (two) times daily.  02/28/18   [provider]  enalapril (VASOTEC) 2.5 MG tablet Take 1 tablet (2.5 mg total) by mouth daily. 08/28/18   Vaughan Basta, MD  Flaxseed, Linseed, (FLAX SEED OIL) 1000 MG CAPS Take 1,300 mg by mouth daily.     [provider]  fluticasone (FLONASE) 50 MCG/ACT nasal spray Place 1 spray into both nostrils daily as needed for allergies.     [provider]  hydrALAZINE (APRESOLINE) 100 MG tablet Take 100 mg by mouth 3 (three) times daily.     [provider]  hydrOXYzine (ATARAX/VISTARIL) 25 MG tablet Take 25 mg by mouth 4 (four) times daily as needed for itching.     [provider]  insulin aspart (NOVOLOG) 100 UNIT/ML FlexPen Inject 10 Units into the skin 3 (three) times daily with meals.     [provider]  insulin detemir (LEVEMIR) 100 UNIT/ML injection Inject 0.1 mLs (10 Units total) into the skin daily. 08/28/18   Vaughan Basta, MD  isosorbide mononitrate (IMDUR) 120 MG 24 hr tablet  Take 120 mg by mouth daily.    [provider]  ketoconazole (NIZORAL) 2 % shampoo Apply 1 application topically 2 (two) times a week.  11/30/17   [provider]  oxyCODONE-acetaminophen (PERCOCET/ROXICET) 5-325 MG per tablet Take 1 tablet by mouth 3 (three) times daily.    [provider]  pantoprazole (PROTONIX) 40 MG tablet Take 1 tablet (40 mg total) by mouth daily. 40 mg BID for 1 month followed by once daily Patient taking differently: Take 40 mg by mouth 2 (two) times daily.  08/06/15   Max Sane, MD  simethicone (MYLICON) 80 MG chewable tablet Chew 2 tablets (160 mg total) by mouth 4 (four) times daily as needed for flatulence. 08/27/18   Vaughan Basta, MD  SPS 15 GM/60ML suspension Take 60 mLs by mouth as directed. When eating potassium rich foods 06/05/18   [provider]  torsemide (DEMADEX) 20 MG tablet Take 3 tablets (60 mg total) by mouth daily. 08/28/18   Vaughan Basta, MD  triamcinolone cream (KENALOG) 0.1 % Apply 1 application topically 2 (two) times daily as needed (ITCHING).     [provider]       VITAL SIGNS:  Blood pressure (!) 143/55, pulse 74, temperature 97.7 F (36.5 C), temperature source Oral, resp. rate (!) 28, SpO2 100 %.  PHYSICAL EXAMINATION:   Physical Exam  Constitutional: He is oriented to person, place, and time. No distress.  HENT:  Head: Normocephalic.  Eyes: No scleral icterus.  Neck: Normal range of motion. Neck supple. No JVD present. No tracheal deviation present.  Cardiovascular: Regular rhythm and normal heart sounds. Tachycardia present. Exam reveals no gallop and no friction rub.  No murmur heard. Pulmonary/Chest: He is in respiratory distress. He has wheezes. He has no rales. He exhibits no tenderness.  Abdominal: Soft. Bowel sounds are normal. He exhibits no distension and no mass. There is no tenderness. There is no rebound and no guarding.  Musculoskeletal: Normal range of motion.       Right lower leg: He exhibits no edema.  Neurological: He is alert and oriented to person, place, and time.  Skin: Skin is warm. No rash noted. No erythema.  Psychiatric: Judgment normal.      LABORATORY PANEL:   CBC Recent Labs  Lab 10/17/18 1154  WBC 7.5  HGB 7.7*  HCT 24.8*  PLT 206   ------------------------------------------------------------------------------------------------------------------  Chemistries  Recent Labs  Lab 10/17/18 1154  NA 143  K 4.7  CL 111  CO2 23  GLUCOSE 112*  BUN 59*  CREATININE 3.47*  CALCIUM 8.0*   ------------------------------------------------------------------------------------------------------------------  Cardiac Enzymes Recent Labs  Lab 10/17/18 1154  TROPONINI 0.03*   ------------------------------------------------------------------------------------------------------------------  RADIOLOGY:  Dg Chest 2 View  Result Date: 10/17/2018 CLINICAL DATA:  Shortness of breath, generalized chest pain EXAM: CHEST - 2 VIEW COMPARISON:  08/26/2018 FINDINGS: Prior CABG. Cardiomegaly with vascular  congestion and mild bilateral airspace opacities, likely edema/CHF. Suspect small layering effusions. No acute bony abnormality. Linear atelectasis at the right lung base. IMPRESSION: Cardiomegaly with vascular congestion and probable mild pulmonary edema. Bilateral layering small effusions. Electronically Signed   By: Rolm Baptise M.D.   On: 10/17/2018 12:11    EKG:  pending  IMPRESSION AND PLAN:   77 year old male who presents emergency room due to chest pressure and increasing shortness of breath.  1.  Acute hypoxic respiratory failure in the setting of pulmonary edema due to underlying kidney disease Patient will need stepdown Case discussed with  intensivist Continue BiPAP Continue Lasix ABG ordered stat 2.  Acute pulmonary edema in the setting of chronic kidney disease stage V: Continue Lasix Monitor intake and output with daily weight  3.  End-stage renal disease stage V: Case discussed with Dr. Juleen China he will see the patient in consultation and make further recommendations Avoid nephrotoxic medications  4.  Chest pressure: This is likely due to acute pulmonary edema However will continue to monitor on telemetry and follow troponins [facility] clinic cardiology consultation requested-consult ordered via epic  5.  Diabetes: Sliding scale Diabetes nurse consult requested Continue Levemir  6.  Essential hypertension: Continue Norvasc, clonidine, enalapril, hydralazine, isosorbide    All the records are reviewed and case discussed with ED provider. Management plans discussed with the patient and wife and they are in agreement  CODE STATUS: Full  Critical care TOTAL TIME TAKING CARE OF THIS PATIENT: 55 minutes.    Wille Aubuchon M.D on 10/17/2018 at 1:22 PM  Between 7am to 6pm - Pager - [phone]  After 6pm go to [email or web address] - password [email or web address] [place] [facility]  Office  [phone]  CC: Primary care physician; [name], MD

## 2018-10-17 NOTE — Progress Notes (Signed)
PHARMACIST - PHYSICIAN ORDER COMMUNICATION  CONCERNING: P&T Medication Policy on Herbal Medications  DESCRIPTION:  This patient's order for:  Cod Liver Caps and  Flax Seed Caps has been noted.  This product(s) is classified as an "herbal" or natural product. Due to a lack of definitive safety studies or FDA approval, nonstandard manufacturing practices, plus the potential risk of unknown drug-drug interactions while on inpatient medications, the Pharmacy and Therapeutics Committee does not permit the use of "herbal" or natural products of this type within [facility].   ACTION TAKEN: The pharmacy department is unable to verify this order at this time. Please reevaluate patient's clinical condition at discharge and address if the herbal or natural product(s) should be resumed at that time.  Prudy Feeler, RPh 10/17/2018 2:36 PM

## 2018-10-17 NOTE — ED Notes (Signed)
Date and time results received: 10/17/18 1:13 PM   Test: troponin Critical Value: 0.03  Name of Provider Notified: Dr Archie Balboa

## 2018-10-17 NOTE — ED Notes (Signed)
RT at bedside.

## 2018-10-17 NOTE — ED Provider Notes (Signed)
[facility] Emergency Department Provider Note   ____________________________________________   I have reviewed the triage vital signs and the nursing notes.   HISTORY  Chief Complaint Chest Pain and Shortness of Breath   History limited by: Not Limited   HPI Cory Miller is a 77 y.o. male who presents to the emergency department today because of concern for chest tightness and shortness of breath. Symptoms started three days ago. They have been slightly progressive. The patient states that the chest tightness is located centrally and does not radiate to the upper extremities nor the neck. States that he has had a cough with some production. Does have lower extremity edema but states it is roughly his baseline. Denies any fevers.    Per medical record review patient has a history of recent admission to the hospital for breathing difficulty and was found to be fluid overloaded.   Past Medical History:  Diagnosis Date  . Blood dyscrasia    prostate  . CAD (coronary artery disease) of artery bypass graft   . Carotid atherosclerosis   . Carotid stenosis   . Chronic kidney disease   . CKD (chronic kidney disease), stage V ([place])   . CVA (cerebrovascular accident) ([place])   . Diabetes mellitus without complication ([place])   . Dyspnea   . ED (erectile dysfunction)   . Elevated troponin I level   . Esophageal ulcer   . GERD (gastroesophageal reflux disease)   . Gout   . Hard of hearing    Pt very hard  of hearing  . Hyperlipidemia   . Hypertension   . MI (myocardial infarction) ([place])   . Neuropathy   . Occlusion of both internal carotid arteries   . Prostate CA ([place])   . Sleep apnea     Patient Active Problem List   Diagnosis Date Noted  . Pressure injury of skin 08/23/2018  . Respiratory failure ([place]) 08/22/2018  . Sepsis ([place]) 04/04/2016  . Diabetes ([place]) 04/04/2016  . HTN (hypertension) 04/04/2016  . GERD (gastroesophageal reflux disease)  04/04/2016  . CAD (coronary artery disease) 04/04/2016  . CKD (chronic kidney disease), stage IV ([place]) 04/04/2016  . Abdominal pain 08/02/2015  . Abdominal pain, acute, epigastric   . Pain in the abdomen     Past Surgical History:  Procedure Laterality Date  . ARTERIAL BYPASS SURGRY    . AV FISTULA PLACEMENT Right 06/14/2018   Procedure: ARTERIOVENOUS (AV) FISTULA CREATION(brachiocephalic);  Surgeon: Katha Cabal, MD;  Location: [facility] ORS;  Service: Vascular;  Laterality: Right;  . CAROTID ENDARTERECTOMY Left   . chest tubes x 2     for pneumothorax  . COLONOSCOPY WITH PROPOFOL N/A 06/02/2016   Procedure: COLONOSCOPY WITH PROPOFOL;  Surgeon: Lollie Sails, MD;  Location[facility] ENDOSCOPY;  Service: Endoscopy;  Laterality: N/A;  . COLONOSCOPY WITH PROPOFOL N/A 07/28/2016   Procedure: COLONOSCOPY WITH PROPOFOL;  Surgeon: Lollie Sails, MD;  Location: [facility] ENDOSCOPY;  Service: Endoscopy;  Laterality: N/A;  . CORONARY ANGIOPLASTY    . CORONARY ARTERY BYPASS GRAFT    . decortication,parietal pleurectomy Left 05/2002  . ESOPHAGOGASTRODUODENOSCOPY N/A 08/05/2015   Procedure: ESOPHAGOGASTRODUODENOSCOPY (EGD);  Surgeon: Hulen Luster, MD;  Location: [facility] ENDOSCOPY;  Service: Endoscopy;  Laterality: N/A;  . ESOPHAGOGASTRODUODENOSCOPY (EGD) WITH PROPOFOL N/A 09/30/2015   Procedure: ESOPHAGOGASTRODUODENOSCOPY (EGD) WITH PROPOFOL;  Surgeon: Hulen Luster, MD;  Location: [facility] ENDOSCOPY;  Service: Gastroenterology;  Laterality: N/A;  . ostate cryoablation    . partial  thoracoscopy  6/03    Prior to Admission medications   Medication Sig Start Date End Date Taking? Authorizing Provider  allopurinol (ZYLOPRIM) 100 MG tablet Take 100 mg by mouth daily.    [provider]  amLODipine (NORVASC) 10 MG tablet Take 10 mg by mouth daily.    [provider]  aspirin 81 MG tablet Take 81 mg by mouth daily.    [provider]  atorvastatin (LIPITOR) 40 MG tablet Take 40 mg by mouth at  bedtime.    [provider]  carvedilol (COREG) 12.5 MG tablet Take 12.5 mg by mouth 2 (two) times daily with a meal.  09/25/17   [provider]  cholecalciferol (VITAMIN D) 1000 units tablet Take 1,000 Units by mouth daily.     [provider]  cloNIDine (CATAPRES) 0.1 MG tablet Take 1 tablet (0.1 mg total) by mouth 3 (three) times daily. 08/27/18   Vaughan Basta, MD  clopidogrel (PLAVIX) 75 MG tablet Take 75 mg by mouth daily.    [provider]  [facility] Liver Oil CAPS Take 1 capsule by mouth daily.     [provider]  colchicine 0.6 MG tablet Take 0.6 mg by mouth every other day.     [provider]  docusate sodium (COLACE) 100 MG capsule Take 100 mg by mouth 2 (two) times daily.    [provider]  dorzolamide-timolol (COSOPT) 22.3-6.8 MG/ML ophthalmic solution Place 1 drop into both eyes 2 (two) times daily.  02/28/18   [provider]  enalapril (VASOTEC) 2.5 MG tablet Take 1 tablet (2.5 mg total) by mouth daily. 08/28/18   Vaughan Basta, MD  Flaxseed, Linseed, (FLAX SEED OIL) 1000 MG CAPS Take 1,300 mg by mouth daily.     [provider]  fluticasone (FLONASE) 50 MCG/ACT nasal spray Place 1 spray into both nostrils daily as needed for allergies.     [provider]  hydrALAZINE (APRESOLINE) 100 MG tablet Take 100 mg by mouth 3 (three) times daily.     [provider]  hydrOXYzine (ATARAX/VISTARIL) 25 MG tablet Take 25 mg by mouth 4 (four) times daily as needed for itching.     [provider]  insulin aspart (NOVOLOG) 100 UNIT/ML FlexPen Inject 10 Units into the skin 3 (three) times daily with meals.     [provider]  insulin detemir (LEVEMIR) 100 UNIT/ML injection Inject 0.1 mLs (10 Units total) into the skin daily. 08/28/18   Vaughan Basta, MD  isosorbide mononitrate (IMDUR) 120 MG 24 hr tablet Take 120 mg by mouth daily.    [provider]   ketoconazole (NIZORAL) 2 % shampoo Apply 1 application topically 2 (two) times a week.  11/30/17   [provider]  oxyCODONE-acetaminophen (PERCOCET/ROXICET) 5-325 MG per tablet Take 1 tablet by mouth 3 (three) times daily.    [provider]  pantoprazole (PROTONIX) 40 MG tablet Take 1 tablet (40 mg total) by mouth daily. 40 mg BID for 1 month followed by once daily Patient taking differently: Take 40 mg by mouth 2 (two) times daily.  08/06/15   Max Sane, MD  simethicone (MYLICON) 80 MG chewable tablet Chew 2 tablets (160 mg total) by mouth 4 (four) times daily as needed for flatulence. 08/27/18   Vaughan Basta, MD  SPS 15 GM/60ML suspension Take 60 mLs by mouth as directed. When eating potassium rich foods 06/05/18   [provider]  torsemide (DEMADEX) 20 MG tablet Take 3  tablets (60 mg total) by mouth daily. 08/28/18   Vaughan Basta, MD  triamcinolone cream (KENALOG) 0.1 % Apply 1 application topically 2 (two) times daily as needed ([place]).     [provider]    Allergies Patient has no known allergies.  Family History  Problem Relation Age of Onset  . CAD Unknown   . Diabetes Unknown   . Cancer Unknown   . Stroke Unknown     Social History Social History   Tobacco Use  . Smoking status: Former Research scientist (life sciences)  . Smokeless tobacco: Never Used  Substance Use Topics  . Alcohol use: Yes    Alcohol/week: 0.0 standard drinks  . Drug use: No    Review of Systems Constitutional: No fever/chills Eyes: No visual changes. ENT: No sore throat. Cardiovascular: Positive for chest tightness.  Respiratory: Positive for shortness of breath. Gastrointestinal: No abdominal pain.  No nausea, no vomiting.  No diarrhea.   Genitourinary: Negative for dysuria. Musculoskeletal: Negative for back pain. Skin: Negative for rash. Neurological: Negative for headaches, focal weakness or  numbness.  ____________________________________________   PHYSICAL EXAM:  VITAL SIGNS: ED Triage Vitals  Enc Vitals Group     BP 10/17/18 1107 (!) 123/30     Pulse Rate 10/17/18 1107 81     Resp 10/17/18 1107 (!) 26     Temp 10/17/18 1107 97.7 F (36.5 C)     Temp Source 10/17/18 1107 Oral     SpO2 10/17/18 1107 98 %     Weight --      Height --      Head Circumference --      Peak Flow --      Pain Score 10/17/18 1111 10   Constitutional: Alert and oriented.  Eyes: Conjunctivae are normal.  ENT      Head: Normocephalic and atraumatic.      Nose: No congestion/rhinnorhea.      Mouth/Throat: Mucous membranes are moist.      Neck: No stridor. Hematological/Lymphatic/Immunilogical: No cervical lymphadenopathy. Cardiovascular: Normal rate, regular rhythm.  No murmurs, rubs, or gallops.  Respiratory: Normal respiratory effort without tachypnea nor retractions. Breath sounds are clear and equal bilaterally. No wheezes/rales/rhonchi. Gastrointestinal: Soft and non tender. No rebound. No guarding.  Genitourinary: Deferred Musculoskeletal: Normal range of motion in all extremities. 2+ bilateral edema.  Neurologic:  Normal speech and language. No gross focal neurologic deficits are appreciated.  Skin:  Skin is warm, dry and intact. No rash noted. Psychiatric: Mood and affect are normal. Speech and behavior are normal. Patient exhibits appropriate insight and judgment.  ____________________________________________    LABS (pertinent positives/negatives)  Trop 0.03 BMP na 143, k 4.7, glu 112, cr 3.47 CBC wbc 7.5, hgb 7.7, plt 206  ____________________________________________   EKG  I, Nance Pear, attending physician, personally viewed and interpreted this EKG  EKG Time: 1104 Rate: 85 Rhythm: normal sinus rhythm Axis: left axis deviation Intervals: qtc 454 QRS: narrow, q waves III, aVF, V1 ST changes: no st elevation Impression: abnormal  ekg  ____________________________________________    RADIOLOGY  CXR Pulmonary edema, small effusions   ____________________________________________   PROCEDURES  Procedures  ____________________________________________   INITIAL IMPRESSION / ASSESSMENT AND PLAN / ED COURSE  Pertinent labs & imaging results that were available during my care of the patient were reviewed by me and considered in my medical decision making (see chart for details).   Patient presented to the emergency department today because of concerns for shortness of breath and chest  tightness.  Differential would be broad.  Would consider pneumonia, pneumothorax, PE, CHF, COPD amongst other etiologies.  Patient did have a recent admission for fluid overload.  Chest x-ray today is concerning for some pulmonary edema and patient does have lower extremity edema.  Also patient's hemoglobin level is slightly decreased from previous.  ____________________________________________   FINAL CLINICAL IMPRESSION(S) / ED DIAGNOSES  Final diagnoses:  Shortness of breath  Acute pulmonary edema ([place])     Note: This dictation was prepared with Dragon dictation. Any transcriptional errors that result from this process are unintentional     Nance Pear, MD 10/17/18 1330

## 2018-10-17 NOTE — Progress Notes (Signed)
Inpatient Diabetes Program Recommendations  AACE/ADA: New Consensus Statement on Inpatient Glycemic Control (2015)  Target Ranges:  Prepandial:   less than 140 mg/dL      Peak postprandial:   less than 180 mg/dL (1-2 hours)      Critically ill patients:  140 - 180 mg/dL   Results for Cory Miller, Cory Miller (MRN [id number]) as of 10/17/2018 15:21  Ref. Range 10/17/2018 11:54  Glucose Latest Ref Range: 70 - 99 mg/dL 112 (H)    Admit with: Acute hypoxic respiratory failure in the setting of pulmonary edema due to underlying kidney disease  History: DM, CKD  Home DM Meds: Novolog 10 units TID with meals       Levemir 10 units Daily  Current Orders: Novolog Sensitive Correction Scale/ SSI (0-9 units) TID AC       Levemir 10 units Daily     Current Hemoglobin A1c level pending.  Levemir and Novolog SSi both to start this afternoon.     --Will follow patient during hospitalization--  Wyn Quaker RN, MSN, CDE Diabetes Coordinator Inpatient Glycemic Control Team Team Pager: [phone] (8a-5p)'

## 2018-10-17 NOTE — ED Notes (Signed)
MD Benjie Karvonen and Archie Balboa made aware patient is not tolerating nasal canula after standing to user the restroom.

## 2018-10-17 NOTE — ED Notes (Signed)
RT contacted to put on bi-pap per MD goodman

## 2018-10-17 NOTE — ED Triage Notes (Signed)
Pt reports for 3 days he has felt tight in his chest and been short of breath.Pt presents with nasal canula in place on 2L.

## 2018-10-17 NOTE — ED Notes (Signed)
Pt presents to ED with c/c of "tightness" in his chest and SoB. Sx began on Monday and Pt attempted to make appt with PCM but with no availability. Pt reports Hx of cardiac surgery, stents in his neck. Pt was on home oxygen but his PCM took him off approx 1 month ago. Pt is currently on medication for HTN, hyperlipidemia, beta blockers. Pt is a diabetic and has a fistula placed on R arm for upcoming dialysis.

## 2018-10-18 ENCOUNTER — Inpatient Hospital Stay: Payer: Medicare PPO

## 2018-10-18 ENCOUNTER — Inpatient Hospital Stay (HOSPITAL_COMMUNITY)
Admit: 2018-10-18 | Discharge: 2018-10-18 | Disposition: A | Discharge status: Home / Self Care | Payer: Medicare PPO | Attending: Internal Medicine | Admitting: Internal Medicine

## 2018-10-18 DIAGNOSIS — R778 Other specified abnormalities of plasma proteins: Secondary | ICD-10-CM

## 2018-10-18 DIAGNOSIS — R079 Chest pain, unspecified: Secondary | ICD-10-CM

## 2018-10-18 DIAGNOSIS — R7989 Other specified abnormal findings of blood chemistry: Principal | ICD-10-CM

## 2018-10-18 DIAGNOSIS — J9601 Acute respiratory failure with hypoxia: Secondary | ICD-10-CM

## 2018-10-18 DIAGNOSIS — R0602 Shortness of breath: Secondary | ICD-10-CM

## 2018-10-18 LAB — GLUCOSE, CAPILLARY
GLUCOSE-CAPILLARY: 164 mg/dL — AB (ref 70–99)
GLUCOSE-CAPILLARY: 159 mg/dL — AB (ref 70–99)
GLUCOSE-CAPILLARY: 139 mg/dL — AB (ref 70–99)
GLUCOSE-CAPILLARY: 160 mg/dL — AB (ref 70–99)
Glucose-Capillary: 262 mg/dL — ABNORMAL HIGH (ref 70–99)

## 2018-10-18 LAB — CBC WITH DIFFERENTIAL/PLATELET
ABS IMMATURE GRANULOCYTES: 0.05 10*3/uL (ref 0.00–0.07)
Basophils Absolute: 0 10*3/uL (ref 0.0–0.1)
Basophils Relative: 0 %
Eosinophils Absolute: 0.1 10*3/uL (ref 0.0–0.5)
Eosinophils Relative: 1 %
HCT: 24.7 % — ABNORMAL LOW (ref 39.0–52.0)
HEMOGLOBIN: 7.4 g/dL — AB (ref 13.0–17.0)
IMMATURE GRANULOCYTES: 1 %
LYMPHS PCT: 6 %
Lymphs Abs: 0.5 10*3/uL — ABNORMAL LOW (ref 0.7–4.0)
MCH: 28.7 pg (ref 26.0–34.0)
MCHC: 30 g/dL (ref 30.0–36.0)
MCV: 95.7 fL (ref 80.0–100.0)
MONO ABS: 0.8 10*3/uL (ref 0.1–1.0)
Monocytes Relative: 9 %
NEUTROS ABS: 7.5 10*3/uL (ref 1.7–7.7)
NEUTROS PCT: 83 %
PLATELETS: 218 10*3/uL (ref 150–400)
RBC: 2.58 MIL/uL — AB (ref 4.22–5.81)
RDW: 16.1 % — ABNORMAL HIGH (ref 11.5–15.5)
WBC: 8.9 10*3/uL (ref 4.0–10.5)
nRBC: 0 % (ref 0.0–0.2)

## 2018-10-18 LAB — FERRITIN: FERRITIN: 121 ng/mL (ref 24–336)

## 2018-10-18 LAB — VITAMIN B12: Vitamin B-12: 135 pg/mL — ABNORMAL LOW (ref 180–914)

## 2018-10-18 LAB — BRAIN NATRIURETIC PEPTIDE
B NATRIURETIC PEPTIDE 5: 841 pg/mL — AB (ref 0.0–100.0)
B Natriuretic Peptide: 841 pg/mL — ABNORMAL HIGH (ref 0.0–100.0)

## 2018-10-18 LAB — ECHOCARDIOGRAM COMPLETE
Height: 73 in
WEIGHTICAEL: 4331.6 [oz_av]

## 2018-10-18 LAB — IRON AND TIBC
Iron: 9 ug/dL — ABNORMAL LOW (ref 45–182)
SATURATION RATIOS: 4 % — AB (ref 17.9–39.5)
TIBC: 248 ug/dL — ABNORMAL LOW (ref 250–450)
UIBC: 239 ug/dL

## 2018-10-18 LAB — BASIC METABOLIC PANEL
Anion gap: 11 (ref 5–15)
Anion gap: 8 (ref 5–15)
BUN: 61 mg/dL — ABNORMAL HIGH (ref 8–23)
BUN: 60 mg/dL — ABNORMAL HIGH (ref 8–23)
CALCIUM: 7.7 mg/dL — AB (ref 8.9–10.3)
CALCIUM: 7.5 mg/dL — AB (ref 8.9–10.3)
CO2: 23 mmol/L (ref 22–32)
CO2: 23 mmol/L (ref 22–32)
CREATININE: 3.64 mg/dL — AB (ref 0.61–1.24)
CREATININE: 3.63 mg/dL — AB (ref 0.61–1.24)
Chloride: 109 mmol/L (ref 98–111)
Chloride: 107 mmol/L (ref 98–111)
GFR calc Af Amer: 17 mL/min — ABNORMAL LOW (ref >60)
GFR calc non Af Amer: 15 mL/min — ABNORMAL LOW (ref >60)
GFR, EST AFRICAN AMERICAN: 17 mL/min — AB (ref >60)
GFR, EST NON AFRICAN AMERICAN: 15 mL/min — AB (ref >60)
GLUCOSE: 275 mg/dL — AB (ref 70–99)
Glucose, Bld: 201 mg/dL — ABNORMAL HIGH (ref 70–99)
POTASSIUM: 4.4 mmol/L (ref 3.5–5.1)
Potassium: 4.7 mmol/L (ref 3.5–5.1)
SODIUM: 143 mmol/L (ref 135–145)
SODIUM: 138 mmol/L (ref 135–145)

## 2018-10-18 LAB — MAGNESIUM: Magnesium: 1.2 mg/dL — ABNORMAL LOW (ref 1.7–2.4)

## 2018-10-18 LAB — TROPONIN I: Troponin I: 0.14 ng/mL (ref <0.03)

## 2018-10-18 LAB — HEMOGLOBIN A1C
Hgb A1c MFr Bld: 6.9 % — ABNORMAL HIGH (ref 4.8–5.6)
Mean Plasma Glucose: 151.33 mg/dL

## 2018-10-18 LAB — RETICULOCYTES
Immature Retic Fract: 15.5 % (ref 2.3–15.9)
RBC.: 2.62 MIL/uL — ABNORMAL LOW (ref 4.22–5.81)
RETIC COUNT ABSOLUTE: 28.3 10*3/uL (ref 19.0–186.0)
Retic Ct Pct: 1.1 % (ref 0.4–3.1)

## 2018-10-18 LAB — HEPATIC FUNCTION PANEL
ALK PHOS: 204 U/L — AB (ref 38–126)
ALT: 37 U/L (ref 0–44)
AST: 33 U/L (ref 15–41)
Albumin: 3.2 g/dL — ABNORMAL LOW (ref 3.5–5.0)
BILIRUBIN INDIRECT: 0.8 mg/dL (ref 0.3–0.9)
Bilirubin, Direct: 0.4 mg/dL — ABNORMAL HIGH (ref 0.0–0.2)
TOTAL PROTEIN: 6.7 g/dL (ref 6.5–8.1)
Total Bilirubin: 1.2 mg/dL (ref 0.3–1.2)

## 2018-10-18 LAB — PHOSPHORUS
PHOSPHORUS: 4.4 mg/dL (ref 2.5–4.6)
Phosphorus: 3.5 mg/dL (ref 2.5–4.6)

## 2018-10-18 LAB — FOLATE: Folate: 18.5 ng/mL (ref >5.9)

## 2018-10-18 MED ORDER — PERFLUTREN LIPID MICROSPHERE
1.0000 mL | INTRAVENOUS | Status: AC | PRN
  Administered 2018-10-18: 2 mL via INTRAVENOUS
  Filled 2018-10-18: qty 10

## 2018-10-18 MED ORDER — ORAL CARE MOUTH RINSE
15.0000 mL | Freq: Two times a day (BID) | OROMUCOSAL | Status: DC
  Administered 2018-10-18 – 2018-10-23 (×4): 15 mL via OROMUCOSAL

## 2018-10-18 MED ORDER — FUROSEMIDE 10 MG/ML IJ SOLN
20.0000 mg | Freq: Once | INTRAMUSCULAR | Status: AC
  Administered 2018-10-18: 20 mg via INTRAVENOUS

## 2018-10-18 MED ORDER — NITROGLYCERIN 0.4 MG SL SUBL
0.4000 mg | SUBLINGUAL_TABLET | Freq: Once | SUBLINGUAL | Status: AC
  Administered 2018-10-18: 0.4 mg via SUBLINGUAL
  Filled 2018-10-18: qty 1

## 2018-10-18 MED ORDER — CHLORHEXIDINE GLUCONATE 0.12 % MT SOLN
15.0000 mL | Freq: Two times a day (BID) | OROMUCOSAL | Status: DC
  Administered 2018-10-18 – 2018-10-23 (×10): 15 mL via OROMUCOSAL
  Filled 2018-10-18 (×11): qty 15

## 2018-10-18 MED ORDER — FUROSEMIDE 10 MG/ML IJ SOLN
INTRAMUSCULAR | Status: AC
  Administered 2018-10-18: 20 mg via INTRAVENOUS
  Filled 2018-10-18: qty 2

## 2018-10-18 MED ORDER — FUROSEMIDE 10 MG/ML IJ SOLN
4.0000 mg/h | INTRAVENOUS | Status: DC
  Administered 2018-10-18: 4 mg/h via INTRAVENOUS
  Filled 2018-10-18: qty 20

## 2018-10-18 MED ORDER — IPRATROPIUM-ALBUTEROL 0.5-2.5 (3) MG/3ML IN SOLN
3.0000 mL | RESPIRATORY_TRACT | Status: DC | PRN
  Administered 2018-10-18 – 2018-10-19 (×2): 3 mL via RESPIRATORY_TRACT
  Filled 2018-10-18 (×2): qty 3

## 2018-10-18 NOTE — Progress Notes (Signed)
Post HD assessment    10/18/18 1827  Neurological  Level of Consciousness Alert  Orientation Level Oriented X4  Respiratory  Respiratory Pattern Regular;Dyspnea with exertion  Chest Assessment Chest expansion symmetrical  Cardiac  Pulse Irregular  ECG Monitor Yes  Cardiac Rhythm NSR  Ectopy Unifocal PVC's  Ectopy Frequency Occasional  Vascular  R Radial Pulse +2  L Radial Pulse +2  Edema Generalized;Right lower extremity;Left lower extremity  Integumentary  Integumentary (WDL) X  Skin Color Appropriate for ethnicity  Musculoskeletal  Musculoskeletal (WDL) X  Generalized Weakness Yes  Assistive Device None  GU Assessment  Genitourinary (WDL) X  Genitourinary Symptoms  (HD)  Psychosocial  Psychosocial (WDL) WDL  Emotional support given Given to patient;Given to patient's family

## 2018-10-18 NOTE — Progress Notes (Signed)
[facility]  ROUNDING NOTE   Subjective:   UOP 1638mL overnight  On BIPAP  Furosemide gtt 4mg /hr  Objective:  Vital signs in last 24 hours:  Temp:  [98.6 F (37 C)-99.3 F (37.4 C)] 98.6 F (37 C) (11/22 0800) Pulse Rate:  [74-118] 100 (11/22 1111) Resp:  [15-53] 53 (11/22 1100) BP: (116-181)/(45-81) 164/64 (11/22 1111) SpO2:  [92 %-100 %] 99 % (11/22 1100) FiO2 (%):  [40 %] 40 % (11/22 0455) Weight:  [121.7 kg-122.8 kg] 122.8 kg (11/22 0500)  Weight change:  Filed Weights   10/17/18 1849 10/18/18 0500  Weight: 121.7 kg 122.8 kg    Intake/Output: I/O last 3 completed shifts: In: -  Out: 1615 [Urine:1615]   Intake/Output this shift:  Total I/O In: 17.2 [I.V.:17.2] Out: 275 [Urine:275]  Physical Exam: General: Critically ill  Head: +BIPAP  Eyes: Anicteric, PERRL  Neck: Supple, trachea midline  Lungs:  diminished bilaterally, +BIPAP  Heart: Regular rate and rhythm  Abdomen:  Soft, nontender,   Extremities:  + peripheral edema.  Neurologic: Nonfocal, moving all four extremities  Skin: No lesions  Access: Left AVF +thrill +bruit    Basic Metabolic Panel: Recent Labs  Lab 10/17/18 1154 10/18/18 0611  NA 143 143  K 4.7 4.7  CL 111 109  CO2 23 23  GLUCOSE 112* 201*  BUN 59* 61*  CREATININE 3.47* 3.64*  CALCIUM 8.0* 7.7*  PHOS  --  4.4    Liver Function Tests: Recent Labs  Lab 10/18/18 0611  AST 33  ALT 37  ALKPHOS 204*  BILITOT 1.2  PROT 6.7  ALBUMIN 3.2*   No results for input(s): LIPASE, AMYLASE in the last 168 hours. No results for input(s): AMMONIA in the last 168 hours.  CBC: Recent Labs  Lab 10/17/18 1154 10/18/18 0611  WBC 7.5 8.9  NEUTROABS  --  7.5  HGB 7.7* 7.4*  HCT 24.8* 24.7*  MCV 93.9 95.7  PLT 206 218    Cardiac Enzymes: Recent Labs  Lab 10/17/18 1154 10/18/18 0149  TROPONINI 0.03* 0.14*    BNP: Invalid input(s): POCBNP  CBG: Recent Labs  Lab 10/17/18 1836 10/18/18 0515 10/18/18 0754  10/18/18 1119  GLUCAP 90 164* 159* 139*    Microbiology: Results for orders placed or performed during the hospital encounter of 10/17/18  MRSA PCR Screening     Status: None   Collection Time: 10/17/18  6:58 PM  Result Value Ref Range Status   MRSA by PCR NEGATIVE NEGATIVE Final    Comment:        The GeneXpert MRSA Assay (FDA approved for NASAL specimens only), is one component of a comprehensive MRSA colonization surveillance program. It is not intended to diagnose MRSA infection nor to guide or monitor treatment for MRSA infections. Performed at [facility], [address] [place], [place] [address]     Coagulation Studies: No results for input(s): LABPROT, INR in the last 72 hours.  Urinalysis: No results for input(s): COLORURINE, LABSPEC, PHURINE, GLUCOSEU, HGBUR, BILIRUBINUR, KETONESUR, PROTEINUR, UROBILINOGEN, NITRITE, LEUKOCYTESUR in the last 72 hours.  Invalid input(s): APPERANCEUR    Imaging: Dg Chest 2 View  Result Date: 10/17/2018 CLINICAL DATA:  Shortness of breath, generalized chest pain EXAM: CHEST - 2 VIEW COMPARISON:  08/26/2018 FINDINGS: Prior CABG. Cardiomegaly with vascular congestion and mild bilateral airspace opacities, likely edema/CHF. Suspect small layering effusions. No acute bony abnormality. Linear atelectasis at the right lung base. IMPRESSION: Cardiomegaly with vascular congestion and probable mild  pulmonary edema. Bilateral layering small effusions. Electronically Signed   By: Rolm Baptise M.D.   On: 10/17/2018 12:11   Dg Chest Port 1 View  Result Date: 10/18/2018 CLINICAL DATA:  Acute respiratory failure EXAM: PORTABLE CHEST 1 VIEW COMPARISON:  October 17, 2018 FINDINGS: Cardiomegaly. Bibasilar opacities and diffuse interstitial opacity. Probable small effusions. IMPRESSION: 1. Cardiomegaly and mild edema. 2. Bibasilar opacities may represent superimposed pneumonia. Recommend clinical correlation and follow-up to resolution.  Electronically Signed   By: Dorise Bullion III M.D   On: 10/18/2018 01:54     Medications:   . sodium chloride    . furosemide (LASIX) infusion 4 mg/hr (10/18/18 1100)   . allopurinol  100 mg Oral Daily  . amLODipine  10 mg Oral Daily  . aspirin EC  81 mg Oral Daily  . atorvastatin  40 mg Oral QHS  . carvedilol  12.5 mg Oral BID WC  . chlorhexidine  15 mL Mouth Rinse BID  . cholecalciferol  1,000 Units Oral Daily  . cloNIDine  0.1 mg Oral TID  . clopidogrel  75 mg Oral Daily  . [START ON 10/19/2018] colchicine  0.6 mg Oral QODAY  . docusate sodium  100 mg Oral BID  . dorzolamide-timolol  1 drop Both Eyes BID  . enalapril  2.5 mg Oral Daily  . furosemide  40 mg Intravenous BID  . heparin  5,000 Units Subcutaneous Q8H  . hydrALAZINE  100 mg Oral TID  . insulin aspart  0-9 Units Subcutaneous TID WC  . insulin detemir  10 Units Subcutaneous Daily  . isosorbide mononitrate  120 mg Oral Daily  . mouth rinse  15 mL Mouth Rinse q12n4p  . oxyCODONE-acetaminophen  1 tablet Oral TID  . pantoprazole  40 mg Oral BID  . sodium chloride flush  3 mL Intravenous Q12H   sodium chloride, acetaminophen **OR** acetaminophen, ALPRAZolam, fluticasone, HYDROcodone-acetaminophen, hydrOXYzine, ipratropium-albuterol, ondansetron **OR** ondansetron (ZOFRAN) IV, polyethylene glycol, simethicone, sodium chloride flush  Assessment/ Plan:  [name] is a 77 y.o. black male with chronic kidney disease stage V, hypertension, peripheral vascular disease, obstructive sleep apnea, carotid stenosis, coronary artery disease status post CABG, hyperlipidemia, gout, who presents with chest pain.   1. 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 Baseline creatinine of 4.03, GFR of 15 on 08/27/18.  At this time, will change to End Stage Renal Disease requiring dialysis.  AVF seem mature Will initiate dialysis today. Orders prepared.  Outpatient planning in process. Followed by [facility].  - Continue furosemide gtt.   2. Acute pulmonary edema: with echocardiogram on 08/23/18 showing preserved systolic function Noninvasive positive pressure ventilation  3. Hypertension: elevated. Volume driven  4. Anemia of renal failure: hemoglobin 7.4, normocytic.  - initiate ESA during this admission   LOS: 1 Willadene Mounsey 11/22/201912:28 PM

## 2018-10-18 NOTE — Progress Notes (Signed)
Post HD assessment. PT tolerated tx well without c/o or complication. Net UF 1010, goal met.    10/18/18 1830  Vital Signs  Temp 99.1 F (37.3 C)  Temp Source Oral  Pulse Rate 84  Pulse Rate Source Monitor  Resp (!) 31  BP (!) 133/43  BP Location Left Arm  BP Method Automatic  Patient Position (if appropriate) Lying  Oxygen Therapy  SpO2 98 %  O2 Device Nasal Cannula  O2 Flow Rate (L/min) 6 L/min  Dialysis Weight  Weight 125.1 kg  Type of Weight Post-Dialysis  Post-Hemodialysis Assessment  Rinseback Volume (mL) 250 mL  KECN 23.9 V  Dialyzer Clearance Lightly streaked  Duration of HD Treatment -hour(s) 2 hour(s)  Hemodialysis Intake (mL) 500 mL  UF Total -Machine (mL) 1510 mL  Net UF (mL) 1010 mL  Tolerated HD Treatment Yes  AVG/AVF Arterial Site Held (minutes) 10 minutes  AVG/AVF Venous Site Held (minutes) 10 minutes  Education / Care Plan  Dialysis Education Provided Yes  Documented Education in Care Plan Yes

## 2018-10-18 NOTE — Progress Notes (Signed)
Pt having c/o chest pain and chest tightness. No tachypnea or dyspnea noted. Lung sounds clear/diminished. E-link MD contacted and orders placed for one time nitro SL tab and EKG. Patient vital signs stable. Pt reports relief of pain after administration of nitro tab. Will continue to monitor.

## 2018-10-18 NOTE — Progress Notes (Signed)
[facility] NAME: Cory Miller    MR#:  [id number]  DATE OF BIRTH:  Mar 28, 1941  SUBJECTIVE:   Patient came in with increasing shortness of breath was found with pulmonary edema was placed on BiPAP. Now on nasal cannula oxygen. Sisters in the room. Denies any chest pain. She was on Lasix drip. It is good to be started on hemodialysis. REVIEW OF SYSTEMS:   Review of Systems  Constitutional: Negative for chills, fever and weight loss.  HENT: Negative for ear discharge, ear pain and nosebleeds.   Eyes: Negative for blurred vision, pain and discharge.  Respiratory: Positive for shortness of breath. Negative for sputum production, wheezing and stridor.   Cardiovascular: Positive for leg swelling. Negative for palpitations, orthopnea and PND.  Gastrointestinal: Negative for abdominal pain, diarrhea, nausea and vomiting.  Genitourinary: Negative for frequency and urgency.  Musculoskeletal: Negative for back pain and joint pain.  Neurological: Negative for sensory change, speech change, focal weakness and weakness.  Psychiatric/Behavioral: Negative for depression and hallucinations. The patient is not nervous/anxious.    Tolerating Diet:yesTolerating PT:   DRUG ALLERGIES:  No Known Allergies  VITALS:  Blood pressure (!) 123/50, pulse 88, temperature 98.3 F (36.8 C), temperature source Oral, resp. rate (!) 45, height 6\' 1"  (1.854 m), weight 122.8 kg, SpO2 97 %.  PHYSICAL EXAMINATION:   Physical Exam  GENERAL:  77 y.o.-year-old patient lying in the bed with no acute distress. obese EYES: Pupils equal, round, reactive to light and accommodation. No scleral icterus. Extraocular muscles intact.  HEENT: Head atraumatic, normocephalic. Oropharynx and nasopharynx clear.  NECK:  Supple, no jugular venous distention. No thyroid enlargement, no tenderness.  LUNGS: decreased breath sounds bilaterally, no wheezing, rales, rhonchi. No  use of accessory muscles of respiration.  CARDIOVASCULAR: S1, S2 normal. No murmurs, rubs, or gallops.  ABDOMEN: Soft, nontender, nondistended. Bowel sounds present. No organomegaly or mass.  EXTREMITIES: ++ edema b/l.    NEUROLOGIC: Cranial nerves II through XII are intact. No focal Motor or sensory deficits b/l.   PSYCHIATRIC:  patient is alert and oriented x 3.  SKIN: No obvious rash, lesion, or ulcer.   LABORATORY PANEL:  CBC Recent Labs  Lab 10/18/18 0611  WBC 8.9  HGB 7.4*  HCT 24.7*  PLT 218    Chemistries  Recent Labs  Lab 10/18/18 0611  NA 143  K 4.7  CL 109  CO2 23  GLUCOSE 201*  BUN 61*  CREATININE 3.64*  CALCIUM 7.7*  AST 33  ALT 37  ALKPHOS 204*  BILITOT 1.2   Cardiac Enzymes Recent Labs  Lab 10/18/18 0149  TROPONINI 0.14*   RADIOLOGY:  Dg Chest 2 View  Result Date: 10/17/2018 CLINICAL DATA:  Shortness of breath, generalized chest pain EXAM: CHEST - 2 VIEW COMPARISON:  08/26/2018 FINDINGS: Prior CABG. Cardiomegaly with vascular congestion and mild bilateral airspace opacities, likely edema/CHF. Suspect small layering effusions. No acute bony abnormality. Linear atelectasis at the right lung base. IMPRESSION: Cardiomegaly with vascular congestion and probable mild pulmonary edema. Bilateral layering small effusions. Electronically Signed   By: Rolm Baptise M.D.   On: 10/17/2018 12:11   Dg Chest Port 1 View  Result Date: 10/18/2018 CLINICAL DATA:  Acute respiratory failure EXAM: PORTABLE CHEST 1 VIEW COMPARISON:  October 17, 2018 FINDINGS: Cardiomegaly. Bibasilar opacities and diffuse interstitial opacity. Probable small effusions. IMPRESSION: 1. Cardiomegaly and mild edema. 2. Bibasilar opacities may represent superimposed pneumonia. Recommend clinical correlation and  follow-up to resolution. Electronically Signed   By: Dorise Bullion III M.D   On: 10/18/2018 01:54   ASSESSMENT AND PLAN:   Cory Miller  is a 77 y.o. male with a known history of  CKD stage 5 approaching HD and CAd who presents to the ED due to increasing SOB, chest pressure and wheezing over the past several days.  In the emergency room chest x-ray shows pulmonary edema  1. Acute hypoxic respiratory failure in the setting of pulmonary edema due to underlying kidney disease Case discussed with intensivist Continue BiPAP-- now on [place]  Started on hemodialysis  2. End-stage renal disease stage V:--pt Is now started with HD -Case discussed with Dr. Juleen China  -Avoid nephrotoxic medications  4.  Chest pressure: This is likely due to acute pulmonary edema -patient is symptomatic at present.  5.  Diabetes: Sliding scale Diabetes nurse consult requested Continue Levemir  6.  Essential hypertension: Continue Norvasc, clonidine, enalapril, hydralazine, isosorbide  Case discussed with Care Management/Social Worker. Management plans discussed with the patient, family and they are in agreement.  CODE STATUS: full  DVT Prophylaxis: heparin  TOTAL TIME TAKING CARE OF THIS PATIENT: *30* minutes.  >50% time spent on counselling and coordination of care  POSSIBLE D/C IN 2-3 DAYS, DEPENDING ON CLINICAL CONDITION.  Note: This dictation was prepared with Dragon dictation along with smaller phrase technology. Any transcriptional errors that result from this process are unintentional.  Fritzi Mandes M.D on 10/18/2018 at 3:58 PM  Between 7am to 6pm - Pager - [phone]  After 6pm go to [email or web address] - password [email or web address] [facility]  Office  [phone]  CC: Primary care physician; [name], MDPatient ID: Cory Miller, male   DOB: [birth year], 77 y.o.   MRN: [id number]

## 2018-10-18 NOTE — Progress Notes (Addendum)
Name: Cory Miller MRN: [id number] DOB: [birth year]    ADMISSION DATE:  10/17/2018  BRIEF PATIENT DESCRIPTION:  77 yo male with CKD-stage V currently not on dialysis admitted with acute on chronic renal failure and acute on chronic hypoxic respiratory failure secondary to pulmonary edema requiring Bipap   SIGNIFICANT EVENTS  11/21-Pt admitted to stepdown unit on Bipap   HISTORY OF PRESENT ILLNESS:   This is a 77 yo male with a PMH of OSA, Prostate Cancer, Neuropathy, MI, HTN, Hyperlipidemia, Gout, GERD, Esophageal Ulcer, Erectile Dysfunction, Type II Diabetes Mellitus, CVA, CKD-stage V, Carotid Stenosis, Carotid Atherosclerosis, and Blood Dyscrasia.  He [facility] ER on 11/21 with c/o shortness of breath and central chest tightness onset of symptoms 3 days prior to presentation.  Initial EKG revealed normal sinus rhythm, hr 85, left axis deviation, no ST elevation. Lab results showed glucose 112, BUN 59, creatinine 3.47, calcium 8.0, troponin 0.03, and hgb 7.7. CXR revealed cardiomegaly with pulmonary edema, previous Echo on 08/23/18 showed preserved systolic function.  He has a hx of CKD-stage V and had a right upper extremity AV fistula placed on 06/14/18, he has not started dialysis yet. On 08/08/18 pt followed up with vascular surgery for repeat US of AV fistula to assess readiness for use.  However, ultrasound results revealed worsening stricture of his mid fistula, per vascular pt would require angiogram prior to use. Repeat outpatient duplex ultrasound of dialysis access pending on 09/2018.  During current ER visit he received 60 mg iv lasix x1 dose and was initially on 2L O2 via nasal canula. However, he developed increased work of breathing requiring Bipap.  He was subsequently admitted to the stepdown unit for additional workup and treatment.   PAST MEDICAL HISTORY :   has a past medical history of Blood dyscrasia, CAD (coronary artery disease) of artery bypass graft, Carotid  atherosclerosis, Carotid stenosis, Chronic kidney disease, CKD (chronic kidney disease), stage V ([place]), CVA (cerebrovascular accident) ([place]), Diabetes mellitus without complication ([place]), Dyspnea, ED (erectile dysfunction), Elevated troponin I level, Esophageal ulcer, GERD (gastroesophageal reflux disease), Gout, Hard of hearing, Hyperlipidemia, Hypertension, MI (myocardial infarction) ([place]), Neuropathy, Occlusion of both internal carotid arteries, Prostate CA ([place]), and Sleep apnea.  has a past surgical history that includes Arterial bypass surgry; Esophagogastroduodenoscopy (N/A, 08/05/2015); Esophagogastroduodenoscopy (egd) with propofol (N/A, 09/30/2015); Coronary artery bypass graft; Coronary angioplasty; ostate cryoablation; Carotid endarterectomy (Left); partial thoracoscopy (6/03); decortication,parietal pleurectomy (Left, 05/2002); chest tubes x 2; Colonoscopy with propofol (N/A, 06/02/2016); Colonoscopy with propofol (N/A, 07/28/2016); and AV fistula placement (Right, 06/14/2018).  REVIEW OF SYSTEMS: Positives in BOLD Gen: Denies fever, chills, weight change, fatigue, night sweats HEENT: Denies blurred vision, double vision, hearing loss, tinnitus, sinus congestion, rhinorrhea, sore throat, neck stiffness, dysphagia PULM: shortness of breath, cough, sputum production, hemoptysis, wheezing CV: Denies chest pain, edema, orthopnea, paroxysmal nocturnal dyspnea, palpitations GI: Denies abdominal pain, nausea, vomiting, diarrhea, hematochezia, melena, constipation, change in bowel habits GU: Denies dysuria, hematuria, polyuria, oliguria, urethral discharge Endocrine: Denies hot or cold intolerance, polyuria, polyphagia or appetite change Derm: Denies rash, dry skin, scaling or peeling skin change Heme: Denies easy bruising, bleeding, bleeding gums Neuro: Denies headache, numbness, weakness, slurred speech, loss of memory or consciousness  SUBJECTIVE:  Pt currently on 4L via HFNC and states shortness of  breath has improved   VITAL SIGNS: Temp:  [98.6 F (37 C)-99.3 F (37.4 C)] 98.6 F (37 C) (11/22 0800) Pulse Rate:  [74-118] 100 (11/22 1111) Resp:  [15-53] 53 (  11/22 1100) BP: (116-181)/(45-81) 164/64 (11/22 1111) SpO2:  [92 %-100 %] 99 % (11/22 1100) FiO2 (%):  [40 %] 40 % (11/22 0455) Weight:  [121.7 kg-122.8 kg] 122.8 kg (11/22 0500)  PHYSICAL EXAMINATION: General: acutely ill appearing male, NAD on HFNC Neuro: alert and oriented, follows commands HEENT: supple, JVD present  Cardiovascular: sinus tach, no R/G Lungs: crackles bilateral bases, even, non labored  Abdomen: +BS x4, obese, soft, non tender, non distended  Musculoskeletal: 2+bilateral lower extremities, moves all extremities Skin: intact no rashes or lesions present   Recent Labs  Lab 10/17/18 1154 10/18/18 0611  NA 143 143  K 4.7 4.7  CL 111 109  CO2 23 23  BUN 59* 61*  CREATININE 3.47* 3.64*  GLUCOSE 112* 201*   Recent Labs  Lab 10/17/18 1154 10/18/18 0611  HGB 7.7* 7.4*  HCT 24.8* 24.7*  WBC 7.5 8.9  PLT 206 218   Dg Chest 2 View  Result Date: 10/17/2018 CLINICAL DATA:  Shortness of breath, generalized chest pain EXAM: CHEST - 2 VIEW COMPARISON:  08/26/2018 FINDINGS: Prior CABG. Cardiomegaly with vascular congestion and mild bilateral airspace opacities, likely edema/CHF. Suspect small layering effusions. No acute bony abnormality. Linear atelectasis at the right lung base. IMPRESSION: Cardiomegaly with vascular congestion and probable mild pulmonary edema. Bilateral layering small effusions. Electronically Signed   By: Rolm Baptise M.D.   On: 10/17/2018 12:11   Dg Chest Port 1 View  Result Date: 10/18/2018 CLINICAL DATA:  Acute respiratory failure EXAM: PORTABLE CHEST 1 VIEW COMPARISON:  October 17, 2018 FINDINGS: Cardiomegaly. Bibasilar opacities and diffuse interstitial opacity. Probable small effusions. IMPRESSION: 1. Cardiomegaly and mild edema. 2. Bibasilar opacities may represent  superimposed pneumonia. Recommend clinical correlation and follow-up to resolution. Electronically Signed   By: Dorise Bullion III M.D   On: 10/18/2018 01:54    ASSESSMENT / PLAN:  Acute on chronic respiratory failure secondary to pulmonary edema  Hx: OSA Supplemental O2 for dyspnea and/or hypoxia  Prn bronchodilator therapy Lasix gtt @4  ml/hr for now  Repeat CXR in am   Atelectasis and pneumonia. Bibasilar airspace disease. MRSA PCR -ve -Empiric Rocephin + Zithro. Monitor CXR + CBC + FIO2 + Procalcitonin -Consider deescalation on ABX with clinical improvement.  Hypertension  Mildly elevated troponin likely demand ischemia in setting of respiratory failure  Hx: CAD, Hyperlipidemia, CVA, and Carotid stenosis  Continuous telemetry monitoring Trend troponin's Once able to tolerate po's continue outpatient amlodipine, aspirin, atorvastatin, carvedilol, clonidine, plavix, hydralazine, and imdur   Echo results pending  Cardiology consulted appreciate input-no further cardiac intervention or diagnostics necessary at this time per Cardiology   Acute on chronic renal failure (CKD Stage V) Trend BMP  Replace electrolytes as indicated  Monitor UOP Avoid nephrotoxic medications Nephrology consulted appreciate input-plans to start HD today 11/22  Anemia of chronic kidney disease  VTE px: subq heparin  Trend CBC  Monitor for s/sx of bleeding and transfuse for hgb <7  GERD  SUP px: protonix   Type II Diabetes Mellitus SSI  Continue scheduled levemir   Marda Stalker, [place] Pager [phone] (please enter 7 digits) PCCM Consult Pager [phone] (please enter 7 digits)   I agree with the documented

## 2018-10-18 NOTE — Progress Notes (Signed)
HD tx end    10/18/18 1823  Vital Signs  Pulse Rate 85  Pulse Rate Source Monitor  Resp 18  BP (!) 132/47  BP Location Left Arm  BP Method Automatic  Patient Position (if appropriate) Lying  Oxygen Therapy  SpO2 98 %  O2 Device Nasal Cannula  O2 Flow Rate (L/min) 6 L/min  During Hemodialysis Assessment  Dialysis Fluid Bolus Normal Saline  Bolus Amount (mL) 250 mL  Intra-Hemodialysis Comments Tx completed

## 2018-10-18 NOTE — Progress Notes (Signed)
Pre HD assessment    10/18/18 1600  Vital Signs  Temp 99.4 F (37.4 C)  Temp Source Oral  Pulse Rate 88  Pulse Rate Source Monitor  Resp (!) 34  BP (!) 123/48  BP Location Left Arm  BP Method Automatic  Patient Position (if appropriate) Lying  Oxygen Therapy  SpO2 98 %  O2 Device Nasal Cannula  O2 Flow Rate (L/min) 6 L/min  Pain Assessment  Pain Scale 0-10  Pain Score 0  Dialysis Weight  Weight 126.3 kg  Type of Weight Pre-Dialysis  Time-Out for Hemodialysis  What Procedure? HD  Pt Identifiers(min of two) First/Last Name;MRN/Account#  Correct Site? Yes  Correct Side? Yes  Correct Procedure? Yes  Consents Verified? Yes  Rad Studies Available? N/A  Safety Precautions Reviewed? Yes  Research scientist (physical sciences)  (2A)  Station Number  (Bedside ICU 15)  UF/Alarm Test Passed  Conductivity: Meter 13.8  Conductivity: Machine  13.6  pH 7.6  Reverse Osmosis WRO #1  Normal Saline Lot Number [id number]  Dialyzer Lot Number [id number]  Disposable Set Lot Number [id number]  Machine Temperature 98.6 F (37 C)  Musician and Audible Yes  Blood Lines Intact and Secured Yes  Pre Treatment Patient Checks  Vascular access used during treatment Fistula  Hepatitis B Surface Antigen Results  (unk)  Isolation Initiated Yes  Hepatitis B Surface Antibody  (unk)  Date Hepatitis B Surface Antibody Drawn 10/18/18  Hemodialysis Consent Verified Yes  Hemodialysis Standing Orders Initiated Yes  ECG (Telemetry) Monitor On Yes  Prime Ordered Normal Saline  Length of  DialysisTreatment -hour(s) 2 Hour(s)  Dialyzer Elisio 17H NR  Dialysate 2K, 2.5 Ca  Dialysis Anticoagulant None  Dialysate Flow Ordered 300  Blood Flow Rate Ordered 200 mL/min  Pre Treatment Labs Renal panel;Phosphorus;Hepatitis B Surface Antigen (Magnesium, HBSAB,HB core,HepC, calcium ionized)  Dialysis Blood Pressure Support Ordered Normal Saline  Education / Care Plan  Dialysis Education Provided Yes  Documented  Education in Care Plan Yes

## 2018-10-18 NOTE — Progress Notes (Signed)
Pre HD assessment     10/18/18 1601  Neurological  Level of Consciousness Alert  Orientation Level Oriented X4  Respiratory  Respiratory Pattern Dyspnea at rest;Dyspnea with exertion;Tachypnea  Chest Assessment Chest expansion symmetrical  Cardiac  ECG Monitor Yes  Cardiac Rhythm NSR  Vascular  R Radial Pulse +2  L Radial Pulse +2  Edema Generalized;Right lower extremity;Left lower extremity  Integumentary  Integumentary (WDL) X  Skin Color Appropriate for ethnicity  Musculoskeletal  Musculoskeletal (WDL) X  Generalized Weakness Yes  Assistive Device None  GU Assessment  Genitourinary (WDL) X  Genitourinary Symptoms  (HD)  Psychosocial  Psychosocial (WDL) WDL  Patient Behaviors Appropriate for age;Appropriate for situation;Cooperative;Calm  Needs Expressed Physical  Psychosocial Additional Assessments Family behavior  Family Behavior Appropriate for situation;Supportive;Tearful  Emotional support given Given to patient;Given to patient's family

## 2018-10-18 NOTE — Progress Notes (Signed)
*  PRELIMINARY RESULTS* Echocardiogram 2D Echocardiogram has been performed. Definity image enhancer was administered.  Wallie Char Canden Cieslinski 10/18/2018, 10:55 AM

## 2018-10-18 NOTE — Consult Note (Signed)
[facility] Consultation Note  Patient ID: Cory Miller, MRN: [id number], DOB/AGE: December 29, 1940 77 y.o. Admit date: 10/17/2018   Date of Consult: 10/18/2018 Primary Physician: [name], MD Primary Cardiologist: Nehemiah Massed  Chief Complaint:  Chief Complaint  Patient presents with  . Chest Pain  . Shortness of Breath   Reason for Consult: Heart failure  HPI: 77 y.o. male with known coronary artery disease carotid atherosclerosis with previous cerebrovascular accident hyperlipidemia hypertension having acute on chronic diastolic dysfunction congestive heart failure with normal ejection fraction by echocardiogram with last admission.  The patient does have a EKG showing normal sinus rhythm a chest x-ray because showed causing pulmonary edema and effusion normal troponin and a glomerular filtration rate of 16.  It appears at this time with the patient having significant progression of shortness of breath lower extremity edema cough and congestion that the patient has had acute on chronic diastolic dysfunction congestive heart failure most consistent with worsening chronic kidney disease stage V.  The patient has had close issues with the possibility of dialysis which may be needed in the near future.  The patient has had some significant improvements with BiPAP oxygenation and furosemide.  Past Medical History:  Diagnosis Date  . Blood dyscrasia    prostate  . CAD (coronary artery disease) of artery bypass graft   . Carotid atherosclerosis   . Carotid stenosis   . Chronic kidney disease   . CKD (chronic kidney disease), stage V ([place])   . CVA (cerebrovascular accident) ([place])   . Diabetes mellitus without complication ([place])   . Dyspnea   . ED (erectile dysfunction)   . Elevated troponin I level   . Esophageal ulcer   . GERD (gastroesophageal reflux disease)   . Gout   . Hard of hearing    Pt very hard  of hearing  . Hyperlipidemia   . Hypertension   . MI  (myocardial infarction) ([place])   . Neuropathy   . Occlusion of both internal carotid arteries   . Prostate CA ([place])   . Sleep apnea       Surgical History:  Past Surgical History:  Procedure Laterality Date  . ARTERIAL BYPASS SURGRY    . AV FISTULA PLACEMENT Right 06/14/2018   Procedure: ARTERIOVENOUS (AV) FISTULA CREATION(brachiocephalic);  Surgeon: Katha Cabal, MD;  Location: [facility] ORS;  Service: Vascular;  Laterality: Right;  . CAROTID ENDARTERECTOMY Left   . chest tubes x 2     for pneumothorax  . COLONOSCOPY WITH PROPOFOL N/A 06/02/2016   Procedure: COLONOSCOPY WITH PROPOFOL;  Surgeon: Lollie Sails, MD;  Location: [facility] ENDOSCOPY;  Service: Endoscopy;  Laterality: N/A;  . COLONOSCOPY WITH PROPOFOL N/A 07/28/2016   Procedure: COLONOSCOPY WITH PROPOFOL;  Surgeon: Lollie Sails, MD;  Location: [facility] ENDOSCOPY;  Service: Endoscopy;  Laterality: N/A;  . CORONARY ANGIOPLASTY    . CORONARY ARTERY BYPASS GRAFT    . decortication,parietal pleurectomy Left 05/2002  . ESOPHAGOGASTRODUODENOSCOPY N/A 08/05/2015   Procedure: ESOPHAGOGASTRODUODENOSCOPY (EGD);  Surgeon: Hulen Luster, MD;  Location: [facility] ENDOSCOPY;  Service: Endoscopy;  Laterality: N/A;  . ESOPHAGOGASTRODUODENOSCOPY (EGD) WITH PROPOFOL N/A 09/30/2015   Procedure: ESOPHAGOGASTRODUODENOSCOPY (EGD) WITH PROPOFOL;  Surgeon: Hulen Luster, MD;  Location: [facility] ENDOSCOPY;  Service: Gastroenterology;  Laterality: N/A;  . ostate cryoablation    . partial thoracoscopy  6/03     Home Meds: Prior to Admission medications   Medication Sig Start Date End Date Taking? Authorizing Provider  allopurinol (ZYLOPRIM) 100 MG  tablet Take 100 mg by mouth daily.   Yes [provider]  amLODipine (NORVASC) 10 MG tablet Take 10 mg by mouth daily.   Yes [provider]  aspirin EC 81 MG tablet Take 81 mg by mouth daily.   Yes [provider]  atorvastatin (LIPITOR) 40 MG tablet Take 40 mg by mouth at bedtime.   Yes [provider]  carvedilol (COREG) 12.5 MG tablet Take 12.5 mg by mouth 2 (two) times daily with a meal.  09/25/17  Yes [provider]  cholecalciferol (VITAMIN D) 1000 units tablet Take 1,000 Units by mouth daily.    Yes [provider]  cloNIDine (CATAPRES) 0.1 MG tablet Take 1 tablet (0.1 mg total) by mouth 3 (three) times daily. 08/27/18  Yes Vaughan Basta, MD  clopidogrel (PLAVIX) 75 MG tablet Take 75 mg by mouth daily.   Yes [provider]  colchicine 0.6 MG tablet Take 0.6 mg by mouth every other day.    Yes [provider]  docusate sodium (COLACE) 100 MG capsule Take 100 mg by mouth 2 (two) times daily.   Yes [provider]  dorzolamide-timolol (COSOPT) 22.3-6.8 MG/ML ophthalmic solution Place 1 drop into both eyes 2 (two) times daily.  02/28/18  Yes [provider]  enalapril (VASOTEC) 2.5 MG tablet Take 1 tablet (2.5 mg total) by mouth daily. 08/28/18  Yes Vaughan Basta, MD  Flaxseed, Linseed, (EQL FLAXSEED OIL) 1200 MG CAPS Take 1,200 mg by mouth daily.   Yes [provider]  fluticasone (FLONASE) 50 MCG/ACT nasal spray Place 1 spray into both nostrils daily as needed for allergies.    Yes [provider]  hydrALAZINE (APRESOLINE) 100 MG tablet Take 100 mg by mouth 3 (three) times daily.    Yes [provider]  hydrOXYzine (ATARAX/VISTARIL) 25 MG tablet Take 25 mg by mouth 4 (four) times daily as needed for itching.    Yes [provider]  insulin aspart (NOVOLOG) 100 UNIT/ML FlexPen Inject 10 Units into the skin 3 (three) times daily with meals.    Yes [provider]  insulin detemir (LEVEMIR) 100 UNIT/ML injection Inject 0.1 mLs (10 Units total) into the skin daily. Patient taking differently: Inject 10 Units into the skin at bedtime.  08/28/18  Yes Vaughan Basta, MD  isosorbide mononitrate (IMDUR) 120 MG 24 hr tablet Take 120 mg by mouth daily.   Yes [provider]  ketoconazole (NIZORAL) 2 % shampoo Apply 1 application topically 2 (two) times a week.  11/30/17  Yes [provider]  oxyCODONE-acetaminophen (PERCOCET/ROXICET) 5-325 MG per tablet Take 1 tablet by mouth 3 (three) times daily.   Yes [provider]  pantoprazole (PROTONIX) 40 MG tablet Take 1 tablet (40 mg total) by mouth daily. 40 mg BID for 1 month followed by once daily Patient taking differently: Take 40 mg by mouth 2 (two) times daily.  08/06/15  Yes Max Sane, MD  simethicone (MYLICON) 80 MG chewable tablet Chew 2 tablets (160 mg total) by mouth 4 (four) times daily as needed for flatulence. 08/27/18  Yes Vaughan Basta, MD  sodium bicarbonate 650 MG tablet Take 650 mg by mouth 2 (two) times daily.   Yes [provider]  SPS 15 GM/60ML suspension Take 60 mLs by mouth as directed. When eating potassium rich foods 06/05/18  Yes [provider]  torsemide (DEMADEX) 20 MG tablet Take 3 tablets (60 mg total) by mouth daily. Patient taking differently: Take  40 mg by mouth daily.  08/28/18  Yes Vaughan Basta, MD  triamcinolone cream (KENALOG) 0.1 % Apply 1 application topically 2 (two) times daily as needed ([place]).    Yes [provider]    Inpatient Medications:  . allopurinol  100 mg Oral Daily  . amLODipine  10 mg Oral Daily  . aspirin EC  81 mg Oral Daily  . atorvastatin  40 mg Oral QHS  . carvedilol  12.5 mg Oral BID WC  . chlorhexidine  15 mL Mouth Rinse BID  . cholecalciferol  1,000 Units Oral Daily  . cloNIDine  0.1 mg Oral TID  . clopidogrel  75 mg Oral Daily  . [START ON 10/19/2018] colchicine  0.6 mg Oral QODAY  . docusate sodium  100 mg Oral BID  . dorzolamide-timolol  1 drop Both Eyes BID  . enalapril  2.5 mg Oral Daily  . furosemide  40 mg Intravenous BID  . heparin  5,000 Units Subcutaneous Q8H  . hydrALAZINE  100 mg Oral TID  . insulin aspart  0-9 Units Subcutaneous TID WC  . insulin detemir  10  Units Subcutaneous Daily  . isosorbide mononitrate  120 mg Oral Daily  . mouth rinse  15 mL Mouth Rinse q12n4p  . oxyCODONE-acetaminophen  1 tablet Oral TID  . pantoprazole  40 mg Oral BID  . sodium chloride flush  3 mL Intravenous Q12H   . sodium chloride    . furosemide (LASIX) infusion 4 mg/hr (10/18/18 4540)    Allergies: No Known Allergies  Social History   Socioeconomic History  . Marital status: Divorced    Spouse name: Not on file  . Number of children: Not on file  . Years of education: Not on file  . Highest education level: Not on file  Occupational History  . Not on file  Social Needs  . Financial resource strain: Not on file  . Food insecurity:    Worry: Not on file    Inability: Not on file  . Transportation needs:    Medical: Not on file    Non-medical: Not on file  Tobacco Use  . Smoking status: Former Research scientist (life sciences)  . Smokeless tobacco: Never Used  Substance and Sexual Activity  . Alcohol use: Yes    Alcohol/week: 0.0 standard drinks  . Drug use: No  . Sexual activity: Not on file  Lifestyle  . Physical activity:    Days per week: Not on file    Minutes per session: Not on file  . Stress: Not on file  Relationships  . Social connections:    Talks on phone: Not on file    Gets together: Not on file    Attends religious service: Not on file    Active member of club or organization: Not on file    Attends meetings of clubs or organizations: Not on file    Relationship status: Not on file  . Intimate partner violence:    Fear of current or ex partner: Not on file    Emotionally abused: Not on file    Physically abused: Not on file    Forced sexual activity: Not on file  Other Topics Concern  . Not on file  Social History Narrative  . Not on file     Family History  Problem Relation Age of Onset  . CAD Unknown   . Diabetes Unknown   . Cancer Unknown   . Stroke Unknown      Review of  Systems Positive for redness of breath cough  congestion Negative for: General:  chills, fever, night sweats or weight changes.  Cardiovascular: PND orthopnea syncope dizziness  Dermatological skin lesions rashes Respiratory: Positive for cough congestion Urologic: Frequent urination urination at night and hematuria Abdominal: negative for nausea, vomiting, diarrhea, bright red blood per rectum, melena, or hematemesis Neurologic: negative for visual changes, and/or hearing changes  All other systems reviewed and are otherwise negative except as noted above.  Labs: Recent Labs    10/17/18 1154 10/18/18 0149  TROPONINI 0.03* 0.14*   Lab Results  Component Value Date   WBC 8.9 10/18/2018   HGB 7.4 (L) 10/18/2018   HCT 24.7 (L) 10/18/2018   MCV 95.7 10/18/2018   PLT 218 10/18/2018    Recent Labs  Lab 10/18/18 0611  NA 143  K 4.7  CL 109  CO2 23  BUN 61*  CREATININE 3.64*  CALCIUM 7.7*  PROT 6.7  BILITOT 1.2  ALKPHOS 204*  ALT 37  AST 33  GLUCOSE 201*   No results found for: CHOL, HDL, LDLCALC, TRIG No results found for: DDIMER  Radiology/Studies:  Dg Chest 2 View  Result Date: 10/17/2018 CLINICAL DATA:  Shortness of breath, generalized chest pain EXAM: CHEST - 2 VIEW COMPARISON:  08/26/2018 FINDINGS: Prior CABG. Cardiomegaly with vascular congestion and mild bilateral airspace opacities, likely edema/CHF. Suspect small layering effusions. No acute bony abnormality. Linear atelectasis at the right lung base. IMPRESSION: Cardiomegaly with vascular congestion and probable mild pulmonary edema. Bilateral layering small effusions. Electronically Signed   By: Rolm Baptise M.D.   On: 10/17/2018 12:11   Dg Chest Port 1 View  Result Date: 10/18/2018 CLINICAL DATA:  Acute respiratory failure EXAM: PORTABLE CHEST 1 VIEW COMPARISON:  October 17, 2018 FINDINGS: Cardiomegaly. Bibasilar opacities and diffuse interstitial opacity. Probable small effusions. IMPRESSION: 1. Cardiomegaly and mild edema. 2. Bibasilar opacities may  represent superimposed pneumonia. Recommend clinical correlation and follow-up to resolution. Electronically Signed   By: Dorise Bullion III M.D   On: 10/18/2018 01:54    EKG: Normal sinus rhythm  Weights: Filed Weights   10/17/18 1849 10/18/18 0500  Weight: 121.7 kg 122.8 kg     Physical Exam: Blood pressure (!) 131/45, pulse 84, temperature 99.3 F (37.4 C), temperature source Oral, resp. rate 15, height 6\' 1"  (1.854 m), weight 122.8 kg, SpO2 100 %. Body mass index is 35.72 kg/m. General: Well developed, well nourished, in no acute distress. Head eyes ears nose throat: Normocephalic, atraumatic, sclera non-icteric, no xanthomas, nares are without discharge. No apparent thyromegaly and/or mass  Lungs: Normal respiratory effort.  Some wheezes, few rales, no rhonchi.  Heart: RRR with normal S1 S2. no murmur gallop, no rub, PMI is normal size and placement, carotid upstroke normal without bruit, jugular venous pressure is normal Abdomen: Soft, non-tender, non-distended with normoactive bowel sounds. No hepatomegaly. No rebound/guarding. No obvious abdominal masses. Abdominal aorta is normal size without bruit Extremities: 1+ edema. no cyanosis, no clubbing, no ulcers  Peripheral : 2+ bilateral upper extremity pulses, 2+ bilateral femoral pulses, 2+ bilateral dorsal pedal pulse Neuro: Alert and oriented. No facial asymmetry. No focal deficit. Moves all extremities spontaneously. Musculoskeletal: Normal muscle tone without kyphosis Psych:  Responds to questions appropriately with a normal affect.    Assessment: 77 year old male with peripheral vascular disease coronary artery disease essential hypertension mixed hyperlipidemia with acute on chronic diastolic dysfunction congestive heart failure most consistent with exacerbation of anemia and worsening chronic kidney disease stage  V without evidence of myocardial infarction  Plan: 1.  Continue supportive care of hypoxia pulmonary edema  and cough and congestion 2.  Continue treatment as per nephrology for pulmonary edema and worsening chronic kidney disease stage V with intravenous diuresis and further consideration of dialysis 3.  No further cardiac intervention or diagnostics necessary at this time due to no evidence of myocardial infarction 4.  Patient is at lowest risk possible at this time for proceeding onto dialysis if necessary 5.  High intensity cholesterol therapy for coronary artery disease cerebrovascular accident in the past 6.  Hypertension control with current medical regimen without change  Signed, Corey Skains M.D. [facility] 10/18/2018, 7:44 AM

## 2018-10-18 NOTE — Progress Notes (Signed)
HD tx start    10/18/18 1618  Vital Signs  Pulse Rate 88  Pulse Rate Source Monitor  Resp (!) 27  BP (!) 119/48  BP Location Left Arm  BP Method Automatic  Patient Position (if appropriate) Lying  Oxygen Therapy  SpO2 97 %  O2 Device Nasal Cannula  O2 Flow Rate (L/min) 6 L/min  During Hemodialysis Assessment  Blood Flow Rate (mL/min) 200 mL/min  Arterial Pressure (mmHg) -70 mmHg  Venous Pressure (mmHg) 100 mmHg  Transmembrane Pressure (mmHg) 40 mmHg  Ultrafiltration Rate (mL/min) 750 mL/min  Dialysate Flow Rate (mL/min) 300 ml/min  Conductivity: Machine  14.1  HD Safety Checks Performed Yes  Dialysis Fluid Bolus Normal Saline  Bolus Amount (mL) 250 mL  Intra-Hemodialysis Comments Tx initiated

## 2018-10-19 ENCOUNTER — Inpatient Hospital Stay: Payer: Medicare PPO

## 2018-10-19 LAB — CBC WITH DIFFERENTIAL/PLATELET
ABS IMMATURE GRANULOCYTES: 0.03 10*3/uL (ref 0.00–0.07)
BASOS PCT: 0 %
Basophils Absolute: 0 10*3/uL (ref 0.0–0.1)
Eosinophils Absolute: 0.2 10*3/uL (ref 0.0–0.5)
Eosinophils Relative: 2 %
HCT: 22.8 % — ABNORMAL LOW (ref 39.0–52.0)
HEMOGLOBIN: 7 g/dL — AB (ref 13.0–17.0)
IMMATURE GRANULOCYTES: 0 %
LYMPHS PCT: 11 %
Lymphs Abs: 0.8 10*3/uL (ref 0.7–4.0)
MCH: 28.7 pg (ref 26.0–34.0)
MCHC: 30.7 g/dL (ref 30.0–36.0)
MCV: 93.4 fL (ref 80.0–100.0)
Monocytes Absolute: 0.8 10*3/uL (ref 0.1–1.0)
Monocytes Relative: 11 %
NEUTROS ABS: 5.4 10*3/uL (ref 1.7–7.7)
NEUTROS PCT: 76 %
PLATELETS: 189 10*3/uL (ref 150–400)
RBC: 2.44 MIL/uL — AB (ref 4.22–5.81)
RDW: 16.1 % — ABNORMAL HIGH (ref 11.5–15.5)
WBC: 7.2 10*3/uL (ref 4.0–10.5)
nRBC: 0 % (ref 0.0–0.2)

## 2018-10-19 LAB — HEPATITIS B CORE ANTIBODY, IGM: HEP B C IGM: NEGATIVE

## 2018-10-19 LAB — PARATHYROID HORMONE, INTACT (NO CA): PTH: 237 pg/mL — AB (ref 15–65)

## 2018-10-19 LAB — HEPATITIS B SURFACE ANTIGEN: Hepatitis B Surface Ag: NEGATIVE

## 2018-10-19 LAB — PHOSPHORUS: PHOSPHORUS: 3.1 mg/dL (ref 2.5–4.6)

## 2018-10-19 LAB — GLUCOSE, CAPILLARY
GLUCOSE-CAPILLARY: 213 mg/dL — AB (ref 70–99)
Glucose-Capillary: 150 mg/dL — ABNORMAL HIGH (ref 70–99)

## 2018-10-19 LAB — BASIC METABOLIC PANEL
ANION GAP: 8 (ref 5–15)
BUN: 53 mg/dL — ABNORMAL HIGH (ref 8–23)
CALCIUM: 7.4 mg/dL — AB (ref 8.9–10.3)
CHLORIDE: 103 mmol/L (ref 98–111)
CO2: 29 mmol/L (ref 22–32)
Creatinine, Ser: 3.17 mg/dL — ABNORMAL HIGH (ref 0.61–1.24)
GFR calc non Af Amer: 18 mL/min — ABNORMAL LOW (ref >60)
GFR, EST AFRICAN AMERICAN: 20 mL/min — AB (ref >60)
GLUCOSE: 233 mg/dL — AB (ref 70–99)
Potassium: 4.1 mmol/L (ref 3.5–5.1)
Sodium: 140 mmol/L (ref 135–145)

## 2018-10-19 LAB — MAGNESIUM: Magnesium: 1.3 mg/dL — ABNORMAL LOW (ref 1.7–2.4)

## 2018-10-19 LAB — HEPATITIS C ANTIBODY: HCV Ab: <0.1 s/co ratio (ref 0.0–0.9)

## 2018-10-19 LAB — HEPATITIS B SURFACE ANTIBODY,QUALITATIVE: HEP B S AB: NONREACTIVE

## 2018-10-19 MED ORDER — EPOETIN ALFA 10000 UNIT/ML IJ SOLN
10000.0000 [IU] | INTRAMUSCULAR | Status: DC
  Administered 2018-10-19 – 2018-10-23 (×2): 10000 [IU] via INTRAVENOUS
  Filled 2018-10-19 (×2): qty 1

## 2018-10-19 MED ORDER — EPOETIN ALFA 10000 UNIT/ML IJ SOLN: 10000.0000 [IU] | INTRAMUSCULAR | Status: DC

## 2018-10-19 NOTE — Progress Notes (Signed)
HD tx end    10/19/18 1347  Vital Signs  Pulse Rate 85  Pulse Rate Source Monitor  Resp (!) 29  BP (!) 160/52  BP Location Left Arm  BP Method Automatic  Patient Position (if appropriate) Lying  Oxygen Therapy  SpO2 100 %  O2 Device Nasal Cannula  O2 Flow Rate (L/min) 6 L/min  During Hemodialysis Assessment  Dialysis Fluid Bolus Normal Saline  Bolus Amount (mL) 250 mL  Intra-Hemodialysis Comments Tx completed

## 2018-10-19 NOTE — Progress Notes (Signed)
[facility]  ROUNDING NOTE   Subjective:   UOP 2175 with furosemide gtt 4mg  /hr  Tarnov 6 L   First hemodialysis treatment yesterday. Tolerated treatment well. UF of 1 liter. AVF with 17ga needles.   Objective:  Vital signs in last 24 hours:  Temp:  [98.3 F (36.8 C)-99.4 F (37.4 C)] 99.2 F (37.3 C) (11/23 0200) Pulse Rate:  [80-100] 90 (11/23 0700) Resp:  [15-60] 44 (11/23 0700) BP: (109-167)/(43-144) 143/50 (11/23 0700) SpO2:  [89 %-100 %] 100 % (11/23 0700) FiO2 (%):  [40 %] 40 % (11/22 2200) Weight:  [120.8 kg-126.3 kg] 120.8 kg (11/23 0529)  Weight change: 4.6 kg Filed Weights   10/18/18 1600 10/18/18 1830 10/19/18 0529  Weight: 126.3 kg 125.1 kg 120.8 kg    Intake/Output: I/O last 3 completed shifts: In: 574.5 [P.O.:480; I.V.:94.5] Out: 4160 [Urine:3150; Other:1010]   Intake/Output this shift:  Total I/O In: -  Out: 225 [Urine:225]  Physical Exam: General: Critically ill  Head: +BIPAP  Eyes: Anicteric, PERRL  Neck: Supple, trachea midline  Lungs:  Bilateral wheezing, 6 L [place]  Heart: Regular rate and rhythm  Abdomen:  Soft, nontender,   Extremities:  + peripheral edema.  Neurologic: Nonfocal, moving all four extremities  Skin: No lesions  Access: Left AVF +thrill +bruit    Basic Metabolic Panel: Recent Labs  Lab 10/17/18 1154 10/18/18 0611 10/18/18 1517 10/19/18 0257  NA 143 143 138 140  K 4.7 4.7 4.4 4.1  CL 111 109 107 103  CO2 23 23 23 29   GLUCOSE 112* 201* 275* 233*  BUN 59* 61* 60* 53*  CREATININE 3.47* 3.64* 3.63* 3.17*  CALCIUM 8.0* 7.7* 7.5* 7.4*  MG  --   --  1.2* 1.3*  PHOS  --  4.4 3.5 3.1    Liver Function Tests: Recent Labs  Lab 10/18/18 0611  AST 33  ALT 37  ALKPHOS 204*  BILITOT 1.2  PROT 6.7  ALBUMIN 3.2*   No results for input(s): LIPASE, AMYLASE in the last 168 hours. No results for input(s): AMMONIA in the last 168 hours.  CBC: Recent Labs  Lab 10/17/18 1154 10/18/18 0611 10/19/18 0257  WBC  7.5 8.9 7.2  NEUTROABS  --  7.5 5.4  HGB 7.7* 7.4* 7.0*  HCT 24.8* 24.7* 22.8*  MCV 93.9 95.7 93.4  PLT 206 218 189    Cardiac Enzymes: Recent Labs  Lab 10/17/18 1154 10/18/18 0149  TROPONINI 0.03* 0.14*    BNP: Invalid input(s): POCBNP  CBG: Recent Labs  Lab 10/18/18 0754 10/18/18 1119 10/18/18 1611 10/18/18 2158 10/19/18 0737  GLUCAP 159* 139* 262* 160* 150*    Microbiology: Results for orders placed or performed during the hospital encounter of 10/17/18  MRSA PCR Screening     Status: None   Collection Time: 10/17/18  6:58 PM  Result Value Ref Range Status   MRSA by PCR NEGATIVE NEGATIVE Final    Comment:        The GeneXpert MRSA Assay (FDA approved for NASAL specimens only), is one component of a comprehensive MRSA colonization surveillance program. It is not intended to diagnose MRSA infection nor to guide or monitor treatment for MRSA infections. Performed at [facility], [address] [place], [place] [address]     Coagulation Studies: No results for input(s): LABPROT, INR in the last 72 hours.  Urinalysis: No results for input(s): COLORURINE, LABSPEC, PHURINE, GLUCOSEU, HGBUR, BILIRUBINUR, KETONESUR, PROTEINUR, UROBILINOGEN, NITRITE, LEUKOCYTESUR in the last 72  hours.  Invalid input(s): APPERANCEUR    Imaging: Dg Chest 2 View  Result Date: 10/17/2018 CLINICAL DATA:  Shortness of breath, generalized chest pain EXAM: CHEST - 2 VIEW COMPARISON:  08/26/2018 FINDINGS: Prior CABG. Cardiomegaly with vascular congestion and mild bilateral airspace opacities, likely edema/CHF. Suspect small layering effusions. No acute bony abnormality. Linear atelectasis at the right lung base. IMPRESSION: Cardiomegaly with vascular congestion and probable mild pulmonary edema. Bilateral layering small effusions. Electronically Signed   By: Rolm Baptise M.D.   On: 10/17/2018 12:11   Dg Chest Port 1 View  Result Date: 10/19/2018 CLINICAL DATA:  Acute  respiratory failure EXAM: PORTABLE CHEST 1 VIEW COMPARISON:  10/18/2018 FINDINGS: Cardiomegaly and cardiac surgical changes again noted. Pulmonary vascular congestion and mild edema again noted. Improved LEFT basilar aeration noted. Mild bibasilar atelectasis identified. There is no evidence of pneumothorax. IMPRESSION: Improved LEFT basilar aeration, otherwise unchanged of the chest with cardiomegaly, pulmonary vascular congestion and mild interstitial edema. Electronically Signed   By: Margarette Canada M.D.   On: 10/19/2018 09:06   Dg Chest Port 1 View  Result Date: 10/18/2018 CLINICAL DATA:  Acute respiratory failure EXAM: PORTABLE CHEST 1 VIEW COMPARISON:  October 17, 2018 FINDINGS: Cardiomegaly. Bibasilar opacities and diffuse interstitial opacity. Probable small effusions. IMPRESSION: 1. Cardiomegaly and mild edema. 2. Bibasilar opacities may represent superimposed pneumonia. Recommend clinical correlation and follow-up to resolution. Electronically Signed   By: Dorise Bullion III M.D   On: 10/18/2018 01:54     Medications:   . sodium chloride    . furosemide (LASIX) infusion 4 mg/hr (10/19/18 0700)   . allopurinol  100 mg Oral Daily  . amLODipine  10 mg Oral Daily  . aspirin EC  81 mg Oral Daily  . atorvastatin  40 mg Oral QHS  . carvedilol  12.5 mg Oral BID WC  . chlorhexidine  15 mL Mouth Rinse BID  . cholecalciferol  1,000 Units Oral Daily  . cloNIDine  0.1 mg Oral TID  . clopidogrel  75 mg Oral Daily  . colchicine  0.6 mg Oral QODAY  . docusate sodium  100 mg Oral BID  . dorzolamide-timolol  1 drop Both Eyes BID  . enalapril  2.5 mg Oral Daily  . heparin  5,000 Units Subcutaneous Q8H  . hydrALAZINE  100 mg Oral TID  . insulin aspart  0-9 Units Subcutaneous TID WC  . insulin detemir  10 Units Subcutaneous Daily  . isosorbide mononitrate  120 mg Oral Daily  . mouth rinse  15 mL Mouth Rinse q12n4p  . oxyCODONE-acetaminophen  1 tablet Oral TID  . pantoprazole  40 mg Oral BID  .  sodium chloride flush  3 mL Intravenous Q12H   sodium chloride, acetaminophen **OR** acetaminophen, ALPRAZolam, fluticasone, HYDROcodone-acetaminophen, hydrOXYzine, ipratropium-albuterol, ondansetron **OR** ondansetron (ZOFRAN) IV, polyethylene glycol, simethicone, sodium chloride flush  Assessment/ Plan:  [name] is a 77 y.o. black male with chronic kidney disease stage V, hypertension, peripheral vascular disease, obstructive sleep apnea, carotid stenosis, coronary artery disease status post CABG, hyperlipidemia, gout, who presents with chest pain.   1. End Stage Renal Disease: initiated hemodialysis on 11/22 through left AVF Patient with diabetic nephropathy with nephrotic range proteinuria. Baseline creatinine of 4.03, GFR of 15 on 08/27/18.  Second hemodialysis treatment for today. Continues to have volume overload - Continue furosemide gtt as ultrafiltration is still limited due to patient being new to dialysis Outpatient planning in process. [facility]  2. Acute pulmonary edema: with  echocardiogram on 08/23/18 showing preserved systolic function Noninvasive positive pressure ventilation on admission. Continues to require high oxygen requirements.   3. Hypertension: elevated. Volume driven. On furosemide gtt - clonidine, carvedilol, enalapril, hydralazine  4. Anemia of renal failure: hemoglobin 7, normocytic.  Low threshold for transfusion - initiate ESA during this admission: EPO 10000 units with HD treatment today.   5. Diabetes mellitus type II with chronic kidney disease: insulin dependent. Hemoglobin A1c of 6.9% on admission - Continue glucose control: currently elevated.    LOS: 2 Gema Ringold 11/23/20199:15 AM

## 2018-10-19 NOTE — Progress Notes (Signed)
Patient being moved to telemetry 2A, room 240- Report given to [facility]

## 2018-10-19 NOTE — Progress Notes (Signed)
Post HD assessment . Pt tolerated tx well without c/o or complication. Net UF 2002, goal met.    10/19/18 1356  Vital Signs  Temp 98.3 F (36.8 C)  Temp Source Oral  Pulse Rate 82  Pulse Rate Source Monitor  Resp (!) 33  BP (!) 158/59  BP Location Left Arm  BP Method Automatic  Patient Position (if appropriate) Lying  Oxygen Therapy  SpO2 99 %  O2 Device Nasal Cannula  O2 Flow Rate (L/min) 6 L/min  Dialysis Weight  Weight 121.6 kg  Type of Weight Post-Dialysis  Post-Hemodialysis Assessment  Rinseback Volume (mL) 250 mL  KECN 36.1 V  Dialyzer Clearance Lightly streaked  Duration of HD Treatment -hour(s) 2.5 hour(s)  Hemodialysis Intake (mL) 500 mL  UF Total -Machine (mL) 2502 mL  Net UF (mL) 2002 mL  Tolerated HD Treatment Yes  AVG/AVF Arterial Site Held (minutes) 10 minutes  AVG/AVF Venous Site Held (minutes) 10 minutes  Education / Care Plan  Dialysis Education Provided Yes  Documented Education in Care Plan Yes

## 2018-10-19 NOTE — Progress Notes (Signed)
[facility] [place] NAME: Cory Miller    MR#:  [id number]  DATE OF BIRTH:  04/25/1941  SUBJECTIVE:   Patient came in with increasing shortness of breath was found with pulmonary edema was placed on BiPAP. Now on nasal cannula oxygen. Patient getting dialysis REVIEW OF SYSTEMS:   Review of Systems  Constitutional: Negative for chills, fever and weight loss.  HENT: Negative for ear discharge, ear pain and nosebleeds.   Eyes: Negative for blurred vision, pain and discharge.  Respiratory: Positive for shortness of breath. Negative for sputum production, wheezing and stridor.   Cardiovascular: Positive for leg swelling. Negative for palpitations, orthopnea and PND.  Gastrointestinal: Negative for abdominal pain, diarrhea, nausea and vomiting.  Genitourinary: Negative for frequency and urgency.  Musculoskeletal: Negative for back pain and joint pain.  Neurological: Negative for sensory change, speech change, focal weakness and weakness.  Psychiatric/Behavioral: Negative for depression and hallucinations. The patient is not nervous/anxious.    Tolerating Diet:yes Tolerating PT: pending  DRUG ALLERGIES:  No Known Allergies  VITALS:  Blood pressure (!) 158/59, pulse 83, temperature 98.3 F (36.8 C), temperature source Oral, resp. rate (!) 39, height 6\' 1"  (1.854 m), weight 121.6 kg, SpO2 99 %.  PHYSICAL EXAMINATION:   Physical Exam  GENERAL:  77 y.o.-year-old patient lying in the bed with no acute distress. obese EYES: Pupils equal, round, reactive to light and accommodation. No scleral icterus. Extraocular muscles intact.  HEENT: Head atraumatic, normocephalic. Oropharynx and nasopharynx clear.  NECK:  Supple, no jugular venous distention. No thyroid enlargement, no tenderness.  LUNGS: decreased breath sounds bilaterally, no wheezing, rales, rhonchi. No use of accessory muscles of respiration.  CARDIOVASCULAR: S1, S2 normal.  No murmurs, rubs, or gallops.  ABDOMEN: Soft, nontender, nondistended. Bowel sounds present. No organomegaly or mass.  EXTREMITIES: ++ edema b/l.    NEUROLOGIC: Cranial nerves II through XII are intact. No focal Motor or sensory deficits b/l.   PSYCHIATRIC:  patient is alert and oriented x 3.  SKIN: No obvious rash, lesion, or ulcer.   LABORATORY PANEL:  CBC Recent Labs  Lab 10/19/18 0257  WBC 7.2  HGB 7.0*  HCT 22.8*  PLT 189    Chemistries  Recent Labs  Lab 10/18/18 0611  10/19/18 0257  NA 143   < > 140  K 4.7   < > 4.1  CL 109   < > 103  CO2 23   < > 29  GLUCOSE 201*   < > 233*  BUN 61*   < > 53*  CREATININE 3.64*   < > 3.17*  CALCIUM 7.7*   < > 7.4*  MG  --    < > 1.3*  AST 33  --   --   ALT 37  --   --   ALKPHOS 204*  --   --   BILITOT 1.2  --   --    < > = values in this interval not displayed.   Cardiac Enzymes Recent Labs  Lab 10/18/18 0149  TROPONINI 0.14*   RADIOLOGY:  Dg Chest Port 1 View  Result Date: 10/19/2018 CLINICAL DATA:  Acute respiratory failure EXAM: PORTABLE CHEST 1 VIEW COMPARISON:  10/18/2018 FINDINGS: Cardiomegaly and cardiac surgical changes again noted. Pulmonary vascular congestion and mild edema again noted. Improved LEFT basilar aeration noted. Mild bibasilar atelectasis identified. There is no evidence of pneumothorax. IMPRESSION: Improved LEFT basilar aeration, otherwise unchanged of the chest with cardiomegaly,  pulmonary vascular congestion and mild interstitial edema. Electronically Signed   By: Margarette Canada M.D.   On: 10/19/2018 09:06   Dg Chest Port 1 View  Result Date: 10/18/2018 CLINICAL DATA:  Acute respiratory failure EXAM: PORTABLE CHEST 1 VIEW COMPARISON:  October 17, 2018 FINDINGS: Cardiomegaly. Bibasilar opacities and diffuse interstitial opacity. Probable small effusions. IMPRESSION: 1. Cardiomegaly and mild edema. 2. Bibasilar opacities may represent superimposed pneumonia. Recommend clinical correlation and follow-up  to resolution. Electronically Signed   By: Dorise Bullion III M.D   On: 10/18/2018 01:54   ASSESSMENT AND PLAN:   Cory Miller  is a 77 y.o. 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  In the emergency room chest x-ray shows pulmonary edema  1. Acute hypoxic respiratory failure in the setting of pulmonary edema due to underlying kidney disease Case discussed with intensivist Continue BiPAP-- now on [place]  Started on hemodialysis  2. End-stage renal disease stage V:--pt Is now started with HD -Case discussed with Dr. Juleen China  -Avoid nephrotoxic medications -cont UF 7.3 liters removed  4.  Chest pressure: This is likely due to acute pulmonary edema -patient is symptomatic at present.  5.  Diabetes: Sliding scale Diabetes nurse consult requested Continue Levemir  6.  Essential hypertension: Continue Norvasc, clonidine, enalapril, hydralazine, isosorbide  Case discussed with Care Management/Social Worker. Management plans discussed with the patient, family and they are in agreement.  CODE STATUS: full  DVT Prophylaxis: heparin  TOTAL TIME TAKING CARE OF THIS PATIENT: *30* minutes.  >50% time spent on counselling and coordination of care  POSSIBLE D/C IN 2-3 DAYS, DEPENDING ON CLINICAL CONDITION.  Note: This dictation was prepared with Dragon dictation along with smaller phrase technology. Any transcriptional errors that result from this process are unintentional.  Fritzi Mandes M.D on 10/19/2018 at 2:21 PM  Between 7am to 6pm - Pager - [phone]  After 6pm go to [email or web address] - password [email or web address] [facility]  Office  [phone]  CC: Primary care physician; [name], MDPatient ID: Cory Miller, male   DOB: 1941/06/23, 77 y.o.   MRN: [id number]

## 2018-10-19 NOTE — Progress Notes (Signed)
Post HD assessment    10/19/18 1354  Neurological  Level of Consciousness Alert  Orientation Level Oriented X4  Respiratory  Respiratory Pattern Regular  Chest Assessment Chest expansion symmetrical  Cough Productive  Cardiac  Pulse Irregular  ECG Monitor Yes  Cardiac Rhythm NSR  Vascular  R Radial Pulse +2  L Radial Pulse +2  Edema Generalized;Right lower extremity;Left lower extremity  Integumentary  Integumentary (WDL) X  Skin Color Appropriate for ethnicity  Musculoskeletal  Musculoskeletal (WDL) X  Generalized Weakness Yes  Assistive Device None  GU Assessment  Genitourinary (WDL) X  Genitourinary Symptoms  (HD)  Psychosocial  Psychosocial (WDL) WDL  Needs Expressed Physical  Emotional support given Given to patient

## 2018-10-19 NOTE — Progress Notes (Signed)
HD tx start, 16 G needles used, no complications, +T/B.    96/04/54 1115  Vital Signs  Pulse Rate 84  Pulse Rate Source Monitor  Resp (!) 76  BP (!) 131/49  BP Location Left Arm  BP Method Automatic  Patient Position (if appropriate) Lying  Oxygen Therapy  SpO2 99 %  O2 Device Bi-PAP  During Hemodialysis Assessment  Blood Flow Rate (mL/min) 250 mL/min  Venous Pressure (mmHg) -90 mmHg  Transmembrane Pressure (mmHg) 100 mmHg  Ultrafiltration Rate (mL/min) 70 mL/min  Dialysate Flow Rate (mL/min) 500 ml/min  Conductivity: Machine  14.2  HD Safety Checks Performed Yes  Dialysis Fluid Bolus Normal Saline  Bolus Amount (mL) 250 mL  Intra-Hemodialysis Comments Tx initiated

## 2018-10-19 NOTE — Progress Notes (Signed)
Pre HD assessment    10/19/18 1045  Vital Signs  Temp 99 F (37.2 C)  Temp Source Axillary  Pulse Rate 86  Pulse Rate Source Monitor  Resp (!) 63  BP (!) 137/53  BP Location Left Arm  BP Method Automatic  Patient Position (if appropriate) Lying  Oxygen Therapy  SpO2 100 %  O2 Device Bi-PAP  Pain Assessment  Pain Scale 0-10  Pain Score 0  Dialysis Weight  Weight 124.1 kg  Type of Weight Post-Dialysis  Time-Out for Hemodialysis  What Procedure? HD  Pt Identifiers(min of two) First/Last Name;MRN/Account#  Correct Site? Yes  Correct Side? Yes  Correct Procedure? Yes  Consents Verified? Yes  Rad Studies Available? N/A  Safety Precautions Reviewed? Yes  Engineer, civil (consulting) Number  (2A)  Station Number  (Bedside ICU 15)  UF/Alarm Test Passed  Conductivity: Meter 13.8  Conductivity: Machine  14.1  pH 7.6  Reverse Osmosis WRO#1  Normal Saline Lot Number [id number]  Dialyzer Lot Number [id number]  Disposable Set Lot Number [id number]  Machine Temperature 98.6 F (37 C)  Musician and Audible Yes  Blood Lines Intact and Secured Yes  Pre Treatment Patient Checks  Vascular access used during treatment Fistula  Hepatitis B Surface Antigen Results Negative  Date Hepatitis B Surface Antigen Drawn 10/18/18  Hepatitis B Surface Antibody  (unk )  Date Hepatitis B Surface Antibody Drawn 10/18/18  Hemodialysis Consent Verified Yes  Hemodialysis Standing Orders Initiated Yes  ECG (Telemetry) Monitor On Yes  Prime Ordered Normal Saline  Length of  DialysisTreatment -hour(s) 2.5 Hour(s)  Dialyzer Elisio 17H NR  Dialysate 3K, 2.5 Ca  Dialysis Anticoagulant None  Dialysate Flow Ordered 500  Blood Flow Rate Ordered 250 mL/min  Ultrafiltration Goal 2 Liters  Dialysis Blood Pressure Support Ordered Normal Saline  Education / Care Plan  Dialysis Education Provided Yes  Documented Education in Care Plan Yes

## 2018-10-19 NOTE — Progress Notes (Signed)
[facility] Encounter Note  Patient: Cory Miller / Admit Date: 10/17/2018 / Date of Encounter: 10/19/2018, 6:20 AM   Subjective: Patient has had some improvements in shortness of and congestive heart failure as well as pulmonary edema.  Pressure has been reasonably well controlled with current medical regimen.  The patient has had continued use of BiPAP which has helped a great deal.  Currently there is been no evidence of chest pain and pressure or evidence of myocardial infarction with a troponin of 0.14 consistent with demand ischemia.  Kidney dysfunction has improved toilet glomerular filtration rate of 20.  Review of Systems: Positive for: Redness of breath Negative for: Vision change, hearing change, syncope, dizziness, nausea, vomiting,diarrhea, bloody stool, stomach pain, cough, congestion, diaphoresis, urinary frequency, urinary pain,skin lesions, skin rashes Others previously listed  Objective: Telemetry: Normal sinus rhythm Physical Exam: Blood pressure (!) 129/52, pulse 95, temperature 99.1 F (37.3 C), temperature source Oral, resp. rate (!) 21, height 6\' 1"  (1.854 m), weight 120.8 kg, SpO2 100 %. Body mass index is 35.14 kg/m. General: Well developed, well nourished, in no acute distress. Head: Normocephalic, atraumatic, sclera non-icteric, no xanthomas, nares are without discharge. Neck: No apparent masses Lungs: Normal respirations with diffuse wheezes, no rhonchi, no rales , some crackles   Heart: Regular rate and rhythm, normal S1 S2, no murmur, no rub, no gallop, PMI is normal size and placement, carotid upstroke normal without bruit, jugular venous pressure normal Abdomen: Soft, non-tender,  distended with normoactive bowel sounds. No hepatosplenomegaly. Abdominal aorta is normal size without bruit Extremities: 1+ edema, no clubbing, no cyanosis, no ulcers,  Peripheral: 2+ radial, 2+ femoral, 2+ dorsal pedal pulses Neuro: Alert and oriented.  Moves all extremities spontaneously. Psych:  Responds to questions appropriately with a normal affect.   Intake/Output Summary (Last 24 hours) at 10/19/2018 7322 Last data filed at 10/19/2018 0100 Gross per 24 hour  Intake 275.99 ml  Output 2685 ml  Net -2409.01 ml    Inpatient Medications:  . allopurinol  100 mg Oral Daily  . amLODipine  10 mg Oral Daily  . aspirin EC  81 mg Oral Daily  . atorvastatin  40 mg Oral QHS  . carvedilol  12.5 mg Oral BID WC  . chlorhexidine  15 mL Mouth Rinse BID  . cholecalciferol  1,000 Units Oral Daily  . cloNIDine  0.1 mg Oral TID  . clopidogrel  75 mg Oral Daily  . colchicine  0.6 mg Oral QODAY  . docusate sodium  100 mg Oral BID  . dorzolamide-timolol  1 drop Both Eyes BID  . enalapril  2.5 mg Oral Daily  . heparin  5,000 Units Subcutaneous Q8H  . hydrALAZINE  100 mg Oral TID  . insulin aspart  0-9 Units Subcutaneous TID WC  . insulin detemir  10 Units Subcutaneous Daily  . isosorbide mononitrate  120 mg Oral Daily  . mouth rinse  15 mL Mouth Rinse q12n4p  . oxyCODONE-acetaminophen  1 tablet Oral TID  . pantoprazole  40 mg Oral BID  . sodium chloride flush  3 mL Intravenous Q12H   Infusions:  . sodium chloride    . furosemide (LASIX) infusion 4 mg/hr (10/18/18 1547)    Labs: Recent Labs    10/18/18 1517 10/19/18 0257  NA 138 140  K 4.4 4.1  CL 107 103  CO2 23 29  GLUCOSE 275* 233*  BUN 60* 53*  CREATININE 3.63* 3.17*  CALCIUM 7.5* 7.4*  MG 1.2* 1.3*  PHOS 3.5 3.1   Recent Labs    10/18/18 0611  AST 33  ALT 37  ALKPHOS 204*  BILITOT 1.2  PROT 6.7  ALBUMIN 3.2*   Recent Labs    10/18/18 0611 10/19/18 0257  WBC 8.9 7.2  NEUTROABS 7.5 5.4  HGB 7.4* 7.0*  HCT 24.7* 22.8*  MCV 95.7 93.4  PLT 218 189   Recent Labs    10/17/18 1154 10/18/18 0149  TROPONINI 0.03* 0.14*   Invalid input(s): POCBNP Recent Labs    10/18/18 2053  HGBA1C 6.9*     Weights: Filed Weights   10/18/18 1600 10/18/18 1830  10/19/18 0529  Weight: 126.3 kg 125.1 kg 120.8 kg     Radiology/Studies:  Dg Chest 2 View  Result Date: 10/17/2018 CLINICAL DATA:  Shortness of breath, generalized chest pain EXAM: CHEST - 2 VIEW COMPARISON:  08/26/2018 FINDINGS: Prior CABG. Cardiomegaly with vascular congestion and mild bilateral airspace opacities, likely edema/CHF. Suspect small layering effusions. No acute bony abnormality. Linear atelectasis at the right lung base. IMPRESSION: Cardiomegaly with vascular congestion and probable mild pulmonary edema. Bilateral layering small effusions. Electronically Signed   By: Rolm Baptise M.D.   On: 10/17/2018 12:11   Dg Chest Port 1 View  Result Date: 10/18/2018 CLINICAL DATA:  Acute respiratory failure EXAM: PORTABLE CHEST 1 VIEW COMPARISON:  October 17, 2018 FINDINGS: Cardiomegaly. Bibasilar opacities and diffuse interstitial opacity. Probable small effusions. IMPRESSION: 1. Cardiomegaly and mild edema. 2. Bibasilar opacities may represent superimposed pneumonia. Recommend clinical correlation and follow-up to resolution. Electronically Signed   By: Dorise Bullion III M.D   On: 10/18/2018 01:54     Assessment and Recommendation  77 y.o. male with acute on chronic diastolic dysfunction congestive heart failure secondary to worsening chronic kidney disease stage V with pulmonary edema and heart failure symptoms with hypertension hyperlipidemia coronary artery disease and peripheral vascular disease stable on appropriate medication management without evidence of myocardial infarction and a troponin level of 0.16 consistent with demand ischemia rather than acute coronary syndrome 1.  Continue supportive care and treatment of acute on chronic kidney disease 2.  No change in medication management for acute on chronic systolic dysfunction heart failure including intravenous Lasix and multiple medication management for severe hypertension 3.  No further cardiac diagnostics necessary at this  time due to no evidence of myocardial infarction 4.  Begin ambulation and further treatment options improvements of above and further diagnostic testing after above if necessary  Signed, Serafina Royals M.D. FACC

## 2018-10-19 NOTE — Progress Notes (Signed)
Pre HD assessment    10/19/18 1050  Neurological  Level of Consciousness Alert  Orientation Level Oriented X4  Respiratory  Respiratory Pattern Dyspnea at rest;Tachypnea  Cardiac  ECG Monitor Yes  Cardiac Rhythm NSR  Vascular  R Radial Pulse +2  L Radial Pulse +2  Edema Generalized;Right lower extremity;Left lower extremity  Integumentary  Integumentary (WDL) X  Skin Color Appropriate for ethnicity  Musculoskeletal  Musculoskeletal (WDL) X  Generalized Weakness Yes  Assistive Device None  GU Assessment  Genitourinary (WDL) X  Genitourinary Symptoms  (HD)  Psychosocial  Psychosocial (WDL) WDL  Needs Expressed Physical  Emotional support given Given to patient

## 2018-10-19 NOTE — Plan of Care (Signed)
Bipap requested for sleep.  No distress noted.

## 2018-10-20 LAB — GLUCOSE, CAPILLARY
GLUCOSE-CAPILLARY: 141 mg/dL — AB (ref 70–99)
GLUCOSE-CAPILLARY: 243 mg/dL — AB (ref 70–99)
GLUCOSE-CAPILLARY: 246 mg/dL — AB (ref 70–99)
Glucose-Capillary: 213 mg/dL — ABNORMAL HIGH (ref 70–99)

## 2018-10-20 MED ORDER — OXYCODONE-ACETAMINOPHEN 5-325 MG PO TABS: 1.0000 | ORAL_TABLET | Freq: Three times a day (TID) | ORAL | Status: DC | PRN

## 2018-10-20 NOTE — Progress Notes (Signed)
[facility] [place] NAME: Cory Miller    MR#:  [id number]  DATE OF BIRTH:  1940-12-29  SUBJECTIVE:   Patient came in with increasing shortness of breath was found with pulmonary edema was placed on BiPAP. Now on nasal cannula oxygen. Patient started on HD this admisison  REVIEW OF SYSTEMS:   Review of Systems  Constitutional: Negative for chills, fever and weight loss.  HENT: Negative for ear discharge, ear pain and nosebleeds.   Eyes: Negative for blurred vision, pain and discharge.  Respiratory: Positive for shortness of breath. Negative for sputum production, wheezing and stridor.   Cardiovascular: Positive for leg swelling. Negative for palpitations, orthopnea and PND.  Gastrointestinal: Negative for abdominal pain, diarrhea, nausea and vomiting.  Genitourinary: Negative for frequency and urgency.  Musculoskeletal: Negative for back pain and joint pain.  Neurological: Negative for sensory change, speech change, focal weakness and weakness.  Psychiatric/Behavioral: Negative for depression and hallucinations. The patient is not nervous/anxious.    Tolerating Diet:yes Tolerating PT: pending  DRUG ALLERGIES:  No Known Allergies  VITALS:  Blood pressure (!) 135/50, pulse 85, temperature 98.1 F (36.7 C), temperature source Oral, resp. rate 19, height 6\' 1"  (1.854 m), weight 115.7 kg, SpO2 96 %.  PHYSICAL EXAMINATION:   Physical Exam  GENERAL:  77 y.o.-year-old patient lying in the bed with no acute distress. obese EYES: Pupils equal, round, reactive to light and accommodation. No scleral icterus. Extraocular muscles intact.  HEENT: Head atraumatic, normocephalic. Oropharynx and nasopharynx clear.  NECK:  Supple, no jugular venous distention. No thyroid enlargement, no tenderness.  LUNGS: decreased breath sounds bilaterally, no wheezing, rales, rhonchi. No use of accessory muscles of respiration.  CARDIOVASCULAR: S1, S2  normal. No murmurs, rubs, or gallops.  ABDOMEN: Soft, nontender, nondistended. Bowel sounds present. No organomegaly or mass.  EXTREMITIES: ++ edema b/l.    NEUROLOGIC: Cranial nerves II through XII are intact. No focal Motor or sensory deficits b/l.   PSYCHIATRIC:  patient is alert and oriented x 3.  SKIN: No obvious rash, lesion, or ulcer.   LABORATORY PANEL:  CBC Recent Labs  Lab 10/19/18 0257  WBC 7.2  HGB 7.0*  HCT 22.8*  PLT 189    Chemistries  Recent Labs  Lab 10/18/18 0611  10/19/18 0257  NA 143   < > 140  K 4.7   < > 4.1  CL 109   < > 103  CO2 23   < > 29  GLUCOSE 201*   < > 233*  BUN 61*   < > 53*  CREATININE 3.64*   < > 3.17*  CALCIUM 7.7*   < > 7.4*  MG  --    < > 1.3*  AST 33  --   --   ALT 37  --   --   ALKPHOS 204*  --   --   BILITOT 1.2  --   --    < > = values in this interval not displayed.   Cardiac Enzymes Recent Labs  Lab 10/18/18 0149  TROPONINI 0.14*   RADIOLOGY:  Dg Chest Port 1 View  Result Date: 10/19/2018 CLINICAL DATA:  Acute respiratory failure EXAM: PORTABLE CHEST 1 VIEW COMPARISON:  10/18/2018 FINDINGS: Cardiomegaly and cardiac surgical changes again noted. Pulmonary vascular congestion and mild edema again noted. Improved LEFT basilar aeration noted. Mild bibasilar atelectasis identified. There is no evidence of pneumothorax. IMPRESSION: Improved LEFT basilar aeration, otherwise unchanged of the  chest with cardiomegaly, pulmonary vascular congestion and mild interstitial edema. Electronically Signed   By: Margarette Canada M.D.   On: 10/19/2018 09:06   ASSESSMENT AND PLAN:   Cory Miller  is a 77 y.o. 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  In the emergency room chest x-ray shows pulmonary edema  1. Acute hypoxic respiratory failure in the setting of pulmonary edema due to underlying kidney disease Case  discussed with intensivist Continue BiPAP-- now on [place]  Started on hemodialysis this admission  2. End-stage renal disease  --pt Is now started with HD -Case discussed with Dr. Juleen China  -Avoid nephrotoxic medications -cont UF 7.9 liters removed  4.  Chest pressure: This is likely due to acute pulmonary edema -patient is symptomatic at present.  5.  Diabetes: Sliding scale Diabetes nurse consult requested Continue Levemir  6.  Essential hypertension:  -Continue Norvasc, clonidine, enalapril, hydralazine, isosorbide  Case discussed with Care Management/Social Worker. Management plans discussed with the patient, family and they are in agreement.  CODE STATUS: full  DVT Prophylaxis: heparin  TOTAL TIME TAKING CARE OF THIS PATIENT: *30* minutes.  >50% time spent on counselling and coordination of care  POSSIBLE D/C IN 2-3 DAYS, DEPENDING ON CLINICAL CONDITION.  Note: This dictation was prepared with Dragon dictation along with smaller phrase technology. Any transcriptional errors that result from this process are unintentional.  Fritzi Mandes M.D on 10/20/2018 at 11:33 AM  Between 7am to 6pm - Pager - [phone]  After 6pm go to [email or web address] - password [email or web address] [facility]  Office  [phone]  CC: Primary care physician; [name], MDPatient ID: Cory Miller, male   DOB: Jun 27, 1941, 77 y.o.   MRN: [id number]

## 2018-10-20 NOTE — Progress Notes (Signed)
[facility] Encounter Note  Patient: Cory Miller / Admit Date: 10/17/2018 / Date of Encounter: 10/20/2018, 6:49 AM   Subjective: Patient has had significant improvements in shortness of and congestive heart failure as well as pulmonary edema.  Pressure has been reasonably well controlled with current medical regimen.  Heart failure symptoms have significantly improved.  Currently there is been no evidence of chest pain and pressure or evidence of myocardial infarction with a troponin of 0.14 consistent with demand ischemia.  Kidney dysfunction has improved   glomerular filtration rate. Echocardiogram showing normal LV systolic function with ejection fraction of 55 to 60% and minimal valvular heart disease.  Review of Systems: Positive for: None Negative for: Vision change, hearing change, syncope, dizziness, nausea, vomiting,diarrhea, bloody stool, stomach pain, cough, congestion, diaphoresis, urinary frequency, urinary pain,skin lesions, skin rashes Others previously listed  Objective: Telemetry: Normal sinus rhythm Physical Exam: Blood pressure (!) 128/52, pulse 87, temperature 98.2 F (36.8 C), temperature source Oral, resp. rate 17, height 6\' 1"  (1.854 m), weight 115.7 kg, SpO2 96 %. Body mass index is 33.64 kg/m. General: Well developed, well nourished, in no acute distress. Head: Normocephalic, atraumatic, sclera non-icteric, no xanthomas, nares are without discharge. Neck: No apparent masses Lungs: Normal respirations with diffuse wheezes, no rhonchi, no rales , some crackles   Heart: Regular rate and rhythm, normal S1 S2, no murmur, no rub, no gallop, PMI is normal size and placement, carotid upstroke normal without bruit, jugular venous pressure normal Abdomen: Soft, non-tender,  distended with normoactive bowel sounds. No hepatosplenomegaly. Abdominal aorta is normal size without bruit Extremities: 1+ edema, no clubbing, no cyanosis, no ulcers,   Peripheral: 2+ radial, 2+ femoral, 2+ dorsal pedal pulses Neuro: Alert and oriented. Moves all extremities spontaneously. Psych:  Responds to questions appropriately with a normal affect.   Intake/Output Summary (Last 24 hours) at 10/20/2018 0649 Last data filed at 10/20/2018 0400 Gross per 24 hour  Intake 418.49 ml  Output 3677 ml  Net -3258.51 ml    Inpatient Medications:  . allopurinol  100 mg Oral Daily  . amLODipine  10 mg Oral Daily  . aspirin EC  81 mg Oral Daily  . atorvastatin  40 mg Oral QHS  . carvedilol  12.5 mg Oral BID WC  . chlorhexidine  15 mL Mouth Rinse BID  . cholecalciferol  1,000 Units Oral Daily  . cloNIDine  0.1 mg Oral TID  . clopidogrel  75 mg Oral Daily  . colchicine  0.6 mg Oral QODAY  . docusate sodium  100 mg Oral BID  . dorzolamide-timolol  1 drop Both Eyes BID  . enalapril  2.5 mg Oral Daily  . epoetin (EPOGEN/PROCRIT) injection  10,000 Units Intravenous Q T,Th,Sa-HD  . heparin  5,000 Units Subcutaneous Q8H  . hydrALAZINE  100 mg Oral TID  . insulin aspart  0-9 Units Subcutaneous TID WC  . insulin detemir  10 Units Subcutaneous Daily  . isosorbide mononitrate  120 mg Oral Daily  . mouth rinse  15 mL Mouth Rinse q12n4p  . oxyCODONE-acetaminophen  1 tablet Oral TID  . pantoprazole  40 mg Oral BID  . sodium chloride flush  3 mL Intravenous Q12H   Infusions:  . sodium chloride    . furosemide (LASIX) infusion 4 mg/hr (10/19/18 0700)    Labs: Recent Labs    10/18/18 1517 10/19/18 0257  NA 138 140  K 4.4 4.1  CL 107 103  CO2 23 29  GLUCOSE 275*  233*  BUN 60* 53*  CREATININE 3.63* 3.17*  CALCIUM 7.5* 7.4*  MG 1.2* 1.3*  PHOS 3.5 3.1   Recent Labs    10/18/18 0611  AST 33  ALT 37  ALKPHOS 204*  BILITOT 1.2  PROT 6.7  ALBUMIN 3.2*   Recent Labs    10/18/18 0611 10/19/18 0257  WBC 8.9 7.2  NEUTROABS 7.5 5.4  HGB 7.4* 7.0*  HCT 24.7* 22.8*  MCV 95.7 93.4  PLT 218 189   Recent Labs    10/17/18 1154 10/18/18 0149   TROPONINI 0.03* 0.14*   Invalid input(s): POCBNP Recent Labs    10/18/18 2053  HGBA1C 6.9*     Weights: Filed Weights   10/19/18 1045 10/19/18 1356 10/20/18 0440  Weight: 124.1 kg 121.6 kg 115.7 kg     Radiology/Studies:  Dg Chest 2 View  Result Date: 10/17/2018 CLINICAL DATA:  Shortness of breath, generalized chest pain EXAM: CHEST - 2 VIEW COMPARISON:  08/26/2018 FINDINGS: Prior CABG. Cardiomegaly with vascular congestion and mild bilateral airspace opacities, likely edema/CHF. Suspect small layering effusions. No acute bony abnormality. Linear atelectasis at the right lung base. IMPRESSION: Cardiomegaly with vascular congestion and probable mild pulmonary edema. Bilateral layering small effusions. Electronically Signed   By: Rolm Baptise M.D.   On: 10/17/2018 12:11   Dg Chest Port 1 View  Result Date: 10/19/2018 CLINICAL DATA:  Acute respiratory failure EXAM: PORTABLE CHEST 1 VIEW COMPARISON:  10/18/2018 FINDINGS: Cardiomegaly and cardiac surgical changes again noted. Pulmonary vascular congestion and mild edema again noted. Improved LEFT basilar aeration noted. Mild bibasilar atelectasis identified. There is no evidence of pneumothorax. IMPRESSION: Improved LEFT basilar aeration, otherwise unchanged of the chest with cardiomegaly, pulmonary vascular congestion and mild interstitial edema. Electronically Signed   By: Margarette Canada M.D.   On: 10/19/2018 09:06   Dg Chest Port 1 View  Result Date: 10/18/2018 CLINICAL DATA:  Acute respiratory failure EXAM: PORTABLE CHEST 1 VIEW COMPARISON:  October 17, 2018 FINDINGS: Cardiomegaly. Bibasilar opacities and diffuse interstitial opacity. Probable small effusions. IMPRESSION: 1. Cardiomegaly and mild edema. 2. Bibasilar opacities may represent superimposed pneumonia. Recommend clinical correlation and follow-up to resolution. Electronically Signed   By: Dorise Bullion III M.D   On: 10/18/2018 01:54     Assessment and Recommendation  77  y.o. male with acute on chronic diastolic dysfunction congestive heart failure secondary to worsening chronic kidney disease stage V with pulmonary edema and acute on chronic diastolic dysfunction heart failure symptoms with hypertension hyperlipidemia coronary artery disease and peripheral vascular disease stable on appropriate medication management without evidence of myocardial infarction and a troponin level of 0.16 consistent with demand ischemia rather than acute coronary syndrome 1.  Continue supportive care and treatment of acute on chronic kidney disease as per nephrology 2.  No change in medication management for acute on chronic systolic dysfunction heart failure including intravenous Lasix and multiple medication management for severe hypertension 3.  No further cardiac diagnostics necessary at this time due to no evidence of myocardial infarction despite elevated troponin consistent with demand ischemia 4.  Begin ambulation and further treatment options improvements of above and further diagnostic testing after above if necessary  Signed, Serafina Royals M.D. FACC

## 2018-10-20 NOTE — Progress Notes (Signed)
[facility]  ROUNDING NOTE   Subjective:   Second hemodialysis treatment yesterday. Tolerated treatment well. UF of 2 liters. Used 16 ga needles on AVF.   Off furosemide gtt  Objective:  Vital signs in last 24 hours:  Temp:  [98.1 F (36.7 C)-98.9 F (37.2 C)] 98.1 F (36.7 C) (11/24 0723) Pulse Rate:  [82-115] 84 (11/24 1150) Resp:  [17-52] 19 (11/24 0723) BP: (128-166)/(50-74) 135/50 (11/24 0723) SpO2:  [96 %-100 %] 96 % (11/24 1150) Weight:  [115.7 kg-121.6 kg] 115.7 kg (11/24 0440)  Weight change: -2.2 kg Filed Weights   10/19/18 1045 10/19/18 1356 10/20/18 0440  Weight: 124.1 kg 121.6 kg 115.7 kg    Intake/Output: I/O last 3 completed shifts: In: 418.5 [P.O.:357; I.V.:61.5] Out: 4077 [Urine:2075; Other:2002]   Intake/Output this shift:  Total I/O In: 240 [P.O.:240] Out: -   Physical Exam: General: Critically ill  Head: +facial edema  Eyes: Anicteric, PERRL  Neck: Supple, trachea midline  Lungs:  Bilateral wheezing, 6 L [place]  Heart: Regular rate and rhythm  Abdomen:  Soft, nontender,   Extremities:  + peripheral edema.  Neurologic: Nonfocal, moving all four extremities  Skin: No lesions  Access: Left AVF +thrill +bruit    Basic Metabolic Panel: Recent Labs  Lab 10/17/18 1154 10/18/18 0611 10/18/18 1517 10/19/18 0257  NA 143 143 138 140  K 4.7 4.7 4.4 4.1  CL 111 109 107 103  CO2 23 23 23 29   GLUCOSE 112* 201* 275* 233*  BUN 59* 61* 60* 53*  CREATININE 3.47* 3.64* 3.63* 3.17*  CALCIUM 8.0* 7.7* 7.5* 7.4*  MG  --   --  1.2* 1.3*  PHOS  --  4.4 3.5 3.1    Liver Function Tests: Recent Labs  Lab 10/18/18 0611  AST 33  ALT 37  ALKPHOS 204*  BILITOT 1.2  PROT 6.7  ALBUMIN 3.2*   No results for input(s): LIPASE, AMYLASE in the last 168 hours. No results for input(s): AMMONIA in the last 168 hours.  CBC: Recent Labs  Lab 10/17/18 1154 10/18/18 0611 10/19/18 0257  WBC 7.5 8.9 7.2  NEUTROABS  --  7.5 5.4  HGB 7.7* 7.4*  7.0*  HCT 24.8* 24.7* 22.8*  MCV 93.9 95.7 93.4  PLT 206 218 189    Cardiac Enzymes: Recent Labs  Lab 10/17/18 1154 10/18/18 0149  TROPONINI 0.03* 0.14*    BNP: Invalid input(s): POCBNP  CBG: Recent Labs  Lab 10/18/18 2158 10/19/18 0737 10/19/18 2118 10/20/18 0725 10/20/18 1140  GLUCAP 160* 150* 213* 141* 243*    Microbiology: Results for orders placed or performed during the hospital encounter of 10/17/18  MRSA PCR Screening     Status: None   Collection Time: 10/17/18  6:58 PM  Result Value Ref Range Status   MRSA by PCR NEGATIVE NEGATIVE Final    Comment:        The GeneXpert MRSA Assay (FDA approved for NASAL specimens only), is one component of a comprehensive MRSA colonization surveillance program. It is not intended to diagnose MRSA infection nor to guide or monitor treatment for MRSA infections. Performed at [facility], [address] [place], [place] [address]     Coagulation Studies: No results for input(s): LABPROT, INR in the last 72 hours.  Urinalysis: No results for input(s): COLORURINE, LABSPEC, PHURINE, GLUCOSEU, HGBUR, BILIRUBINUR, KETONESUR, PROTEINUR, UROBILINOGEN, NITRITE, LEUKOCYTESUR in the last 72 hours.  Invalid input(s): APPERANCEUR    Imaging: Dg Chest [facility] [address]  Result Date: 10/19/2018 CLINICAL DATA:  Acute respiratory failure EXAM: PORTABLE CHEST 1 VIEW COMPARISON:  10/18/2018 FINDINGS: Cardiomegaly and cardiac surgical changes again noted. Pulmonary vascular congestion and mild edema again noted. Improved LEFT basilar aeration noted. Mild bibasilar atelectasis identified. There is no evidence of pneumothorax. IMPRESSION: Improved LEFT basilar aeration, otherwise unchanged of the chest with cardiomegaly, pulmonary vascular congestion and mild interstitial edema. Electronically Signed   By: Margarette Canada M.D.   On: 10/19/2018 09:06     Medications:   . sodium chloride    . furosemide (LASIX) infusion 4 mg/hr  (10/19/18 0700)   . allopurinol  100 mg Oral Daily  . amLODipine  10 mg Oral Daily  . aspirin EC  81 mg Oral Daily  . atorvastatin  40 mg Oral QHS  . carvedilol  12.5 mg Oral BID WC  . chlorhexidine  15 mL Mouth Rinse BID  . cholecalciferol  1,000 Units Oral Daily  . cloNIDine  0.1 mg Oral TID  . clopidogrel  75 mg Oral Daily  . colchicine  0.6 mg Oral QODAY  . docusate sodium  100 mg Oral BID  . dorzolamide-timolol  1 drop Both Eyes BID  . enalapril  2.5 mg Oral Daily  . epoetin (EPOGEN/PROCRIT) injection  10,000 Units Intravenous Q T,Th,Sa-HD  . heparin  5,000 Units Subcutaneous Q8H  . hydrALAZINE  100 mg Oral TID  . insulin aspart  0-9 Units Subcutaneous TID WC  . insulin detemir  10 Units Subcutaneous Daily  . isosorbide mononitrate  120 mg Oral Daily  . mouth rinse  15 mL Mouth Rinse q12n4p  . pantoprazole  40 mg Oral BID  . sodium chloride flush  3 mL Intravenous Q12H   sodium chloride, acetaminophen **OR** acetaminophen, ALPRAZolam, fluticasone, hydrOXYzine, ipratropium-albuterol, ondansetron **OR** ondansetron (ZOFRAN) IV, oxyCODONE-acetaminophen, polyethylene glycol, simethicone, sodium chloride flush  Assessment/ Plan:  [name] is a 77 y.o. black male with chronic kidney disease stage V, hypertension, peripheral vascular disease, obstructive sleep apnea, carotid stenosis, coronary artery disease status post CABG, hyperlipidemia, gout, who presents with chest pain.   1. End Stage Renal Disease: initiated hemodialysis on 11/22 through left AVF. Completed two dialysis treatments. Third treatment scheduled for tomorrow. Orders prepared.  Patient with diabetic nephropathy with nephrotic range proteinuria.  Outpatient planning in process. [facility]  2. Acute pulmonary edema: with echocardiogram on 08/23/18 showing preserved systolic function Noninvasive positive pressure ventilation on admission. Continues to require high oxygen requirements.   3.  Hypertension: elevated on admission due to hypervolumia Placed on furosemide gtt - clonidine, carvedilol, enalapril, hydralazine - start furosemide 40mg  bid PO  4. Anemia of renal failure: hemoglobin 7, normocytic.  Low threshold for transfusion - initiate ESA during this admission: EPO 10000 units with HD treatment first dose was 11/23.   5. Diabetes mellitus type II with chronic kidney disease: insulin dependent. Hemoglobin A1c of 6.9% on admission - Continue glucose control   6. Secondary Hyperparathyroidism: PTH 237 on 11/22. Corrected calcium at goal. Phosphorus at goal. No indication for binders at this time.    LOS: 3 Cory Miller 11/24/201912:45 PM

## 2018-10-20 NOTE — Evaluation (Signed)
Physical Therapy Evaluation Patient Details Name: Cory Miller MRN: [id number] DOB: June 30, 1941 Today's Date: 10/20/2018   History of Present Illness  Pt is a 77 y.o. male with a known history of CKD stage 5 approaching HD and CAD who presented to the ED due to increasing SOB, chest pressure and wheezing over the past several days.  In the emergency room chest x-ray showed pulmonary edema.  He had an echocardiogram in September which shows normal ejection fraction and no mention of diastolic heart failure.  He has stage V renal disease and may be approaching dialysis.  While in the ER patient went to stand up to urinate and became very dyspneic.  He was placed on BiPAP.  Assessment includes: Acute hypoxic respiratory failure in the setting of pulmonary edema due to underlying kidney disease, ESRD, DM, and HTN.   Of note, per cardiology no evidence of MI with elevated troponin consistent with demand ischemia and pt ok to begin ambulation.     Clinical Impression  Pt presents with minor deficits in strength, transfers, mobility, and gait, and with moderate deficits in activity tolerance. Pt was Mod Ind with bed mobility tasks with extra time and effort required by no physical assistance needed.  Pt was steady during sit to/from stand transfers from various height surfaces and demonstrated good concentric strength while standing but only fair eccentric control while sitting.  Pt ambulated progressive distances of 1 x 10', 1 x 30', and 1 x 100' with therapeutic rest breaks between each and SpO2/HR monitored closely.  Pt's baseline SpO2 and HR were 96% and 84 bpm respectively.  After amb attempts the pt's HR reached a max of 101 bpm with SpO2 reaching a low of 85% after ambulating 100' on 4LO2/min.  Pt's SpO2 returned each time to >/= 90% in <30 sec with cues for PLB.  Pt will benefit from HHPT services upon discharge to safely address the above deficits for decreased risk of further functional decline and  return to PLOF.      Follow Up Recommendations Home health PT    Equipment Recommendations  None recommended by PT    Recommendations for Other Services       Precautions / Restrictions Precautions Precautions: Fall Restrictions Weight Bearing Restrictions: No      Mobility  Bed Mobility Overal bed mobility: Modified Independent             General bed mobility comments: Extra time and effort but no physical assistance required  Transfers Overall transfer level: Needs assistance Equipment used: Rolling walker (2 wheeled) Transfers: Sit to/from Stand Sit to Stand: Supervision         General transfer comment: Good concentric control and fair eccentric control during sit to/from stand transfers with good stability upon standing  Ambulation/Gait Ambulation/Gait assistance: Supervision Gait Distance (Feet): Amb 1 x 10', 1 x 30', and 1 x 100' with therapeutic rest breaks between each and vital signs monitored per above. Assistive device: Rolling walker (2 wheeled) Gait Pattern/deviations: Step-through pattern;Decreased step length - right;Decreased step length - left;Trunk flexed Gait velocity: decreased   General Gait Details: Slow cadence with short B step length but steady without LOB; SpO2 on 4LO2/min dropped from 96% to 85% after amb but increased back to >/= 92% after sitting for 20-30 sec with cues for PLB  Stairs            Wheelchair Mobility    Modified Rankin (Stroke Patients Only)       Balance  Overall balance assessment: No apparent balance deficits (not formally assessed)                                           Pertinent Vitals/Pain Pain Assessment: No/denies pain    Home Living Family/patient expects to be discharged to:: Private residence Living Arrangements: Alone Available Help at Discharge: Family;Available PRN/intermittently Type of Home: House Home Access: Stairs to enter Entrance Stairs-Rails:  None Entrance Stairs-Number of Steps: 3 Home Layout: One level Home Equipment: Walker - 2 wheels;Cane - single point Additional Comments: Patient reports in the house he does not use any AD, when outside of the home uses cane.     Prior Function Level of Independence: Independent with assistive device(s)         Comments: Ind amb without an AD in the home, SPC in the community, Ind with ADLs, uses chronic O2 at home, unsure of liters per min.       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)  Cognition Arousal/Alertness: Awake/alert Behavior During Therapy: WFL for tasks assessed/performed Overall Cognitive Status: Within Functional Limits for tasks assessed                                        General Comments      Exercises Other Exercises Other Exercises: PLB education/review   Assessment/Plan    PT Assessment Patient needs continued PT services  PT Problem List Decreased strength;Decreased activity tolerance;Decreased mobility       PT Treatment Interventions DME instruction;Gait training;Stair training;Functional mobility training;Balance training;Therapeutic exercise;Therapeutic activities;Patient/family education    PT Goals (Current goals can be found in the Care Plan section)  Acute Rehab PT Goals Patient Stated Goal: To return home PT Goal Formulation: With patient Time For Goal Achievement: 11/02/18 Potential to Achieve Goals: Good    Frequency Min 2X/week   Barriers to discharge        Co-evaluation               AM-PAC PT "6 Clicks" Mobility  Outcome Measure Help needed turning from your back to your side while in a flat bed without using bedrails?: A  Little Help needed moving from lying on your back to sitting on the side of a flat bed without using bedrails?: A Little Help needed moving to and from a bed to a chair (including a wheelchair)?: Total Help needed standing up from a chair using your arms (e.g., wheelchair or bedside chair)?: A Little Help needed to walk in hospital room?: A Little Help needed climbing 3-5 steps with a railing? : A Little 6 Click Score: 16    End of Session Equipment Utilized During Treatment: Gait belt;Oxygen Activity Tolerance: Patient tolerated treatment well Patient left: in chair;with call bell/phone within reach;with chair alarm set Nurse Communication: Mobility status;Other (comment)(SpO2 response to activity) PT Visit Diagnosis: Muscle weakness (generalized) (M62.81)    Time: 9741-6384 PT Time Calculation (min) (ACUTE ONLY): 38 min   Charges:   PT Evaluation $  PT Eval Low Complexity: 1 Low PT Treatments $Therapeutic Activity: 8-22 mins        D. Royetta Asal PT, DPT 10/20/18, 12:48 PM

## 2018-10-21 LAB — GLUCOSE, CAPILLARY
GLUCOSE-CAPILLARY: 155 mg/dL — AB (ref 70–99)
GLUCOSE-CAPILLARY: 210 mg/dL — AB (ref 70–99)
Glucose-Capillary: 216 mg/dL — ABNORMAL HIGH (ref 70–99)

## 2018-10-21 LAB — CALCIUM, IONIZED: Calcium, Ionized, Serum: 4.3 mg/dL — ABNORMAL LOW (ref 4.5–5.6)

## 2018-10-21 MED ORDER — RENA-VITE PO TABS
1.0000 | ORAL_TABLET | Freq: Every day | ORAL | Status: DC
  Administered 2018-10-21 – 2018-10-22 (×2): 1 via ORAL
  Filled 2018-10-21 (×3): qty 1

## 2018-10-21 MED ORDER — FUROSEMIDE 40 MG PO TABS
40.0000 mg | ORAL_TABLET | Freq: Two times a day (BID) | ORAL | Status: DC
  Administered 2018-10-21 – 2018-10-22 (×3): 40 mg via ORAL
  Filled 2018-10-21 (×3): qty 1

## 2018-10-21 MED ORDER — NEPRO/CARBSTEADY PO LIQD
237.0000 mL | Freq: Two times a day (BID) | ORAL | Status: DC
  Administered 2018-10-21 – 2018-10-22 (×2): 237 mL via ORAL

## 2018-10-21 NOTE — Progress Notes (Signed)
Pre HD Treatment    10/21/18 1120  Vital Signs  Temp 98.4 F (36.9 C)  Temp Source Oral  Pulse Rate 70  Pulse Rate Source Monitor  Resp 18  BP (!) 120/47  BP Location Left Arm  BP Method Automatic  Patient Position (if appropriate) Lying  Oxygen Therapy  SpO2 100 %  O2 Device Nasal Cannula  O2 Flow Rate (L/min) 4 L/min  Pain Assessment  Pain Scale 0-10  Pain Score 0  Dialysis Weight  Weight 118.2 kg  Type of Weight Pre-Dialysis  Time-Out for Hemodialysis  What Procedure? HD  Pt Identifiers(min of two) First/Last Name;MRN/Account#;Pt's DOB(use if MRN/Acct# not available  Correct Site? Yes  Correct Side? Yes  Correct Procedure? Yes  Consents Verified? Yes  Rad Studies Available? N/A  Safety Precautions Reviewed? Yes  Engineer, civil (consulting) Number 1  Station Number 1  UF/Alarm Test Passed  Conductivity: Meter 14  Conductivity: Machine  14.1  pH 7.4  Reverse Osmosis Main  Normal Saline Lot Number [id number]  Dialyzer Lot Number [id number]  Disposable Set Lot Number [id number]  Machine Temperature 98.6 F (37 C)  Musician and Audible Yes  Blood Lines Intact and Secured Yes  Pre Treatment Patient Checks  Vascular access used during treatment Fistula  Hepatitis B Surface Antigen Results Negative  Date Hepatitis B Surface Antigen Drawn 10/18/18  Hepatitis B Surface Antibody  (<10)  Date Hepatitis B Surface Antibody Drawn 10/18/18  Hemodialysis Consent Verified Yes  Hemodialysis Standing Orders Initiated Yes  ECG (Telemetry) Monitor On Yes  Prime Ordered Normal Saline  Length of  DialysisTreatment -hour(s) 3 Hour(s)  Dialysis Treatment Comments Na 140  Dialyzer Elisio 17H NR  Dialysate 3K, 2.5 Ca  Dialysis Anticoagulant None  Dialysate Flow Ordered 600  Blood Flow Rate Ordered 300 mL/min  Ultrafiltration Goal 2.5 Liters  Dialysis Blood Pressure Support Ordered Normal Saline  Education / Care Plan  Dialysis Education Provided Yes  Documented Education in  Care Plan Yes  Note  Observations 16g needles used

## 2018-10-21 NOTE — Progress Notes (Signed)
[facility]  ROUNDING NOTE   Subjective:    HEMODIALYSIS FLOWSHEET:  Blood Flow Rate (mL/min): 300 mL/min Arterial Pressure (mmHg): -120 mmHg Venous Pressure (mmHg): 130 mmHg Transmembrane Pressure (mmHg): 80 mmHg Ultrafiltration Rate (mL/min): 1000 mL/min Dialysate Flow Rate (mL/min): 600 ml/min Conductivity: Machine : 14.1 Conductivity: Machine : 14.1 Dialysis Fluid Bolus: Normal Saline Bolus Amount (mL): 250 mL    Objective:  Vital signs in last 24 hours:  Temp:  [97.9 F (36.6 C)-98.7 F (37.1 C)] 98.5 F (36.9 C) (11/25 1445) Pulse Rate:  [61-87] 78 (11/25 1445) Resp:  [12-24] 17 (11/25 1445) BP: (114-151)/(42-59) 135/51 (11/25 1445) SpO2:  [99 %-100 %] 100 % (11/25 1445) Weight:  [115.8 kg-118.2 kg] 116.4 kg (11/25 1445)  Weight change: -8.3 kg Filed Weights   10/21/18 0429 10/21/18 1120 10/21/18 1445  Weight: 115.8 kg 118.2 kg 116.4 kg    Intake/Output: I/O last 3 completed shifts: In: 720 [P.O.:720] Out: 800 [Urine:800]   Intake/Output this shift:  Total I/O In: 3 [I.V.:3] Out: 2714 [Urine:200; Other:2514]  Physical Exam: General: Critically ill  Head: +facial edema  Eyes: Anicteric,   Neck: Supple, trachea midline  Lungs:  Coarse breath sounds  Heart: Regular rate and rhythm  Abdomen:  Soft, nontender,   Extremities:  + peripheral edema.  Neurologic: Nonfocal, moving all four extremities  Skin: No lesions  Access: Left AVF +thrill +bruit    Basic Metabolic Panel: Recent Labs  Lab 10/17/18 1154 10/18/18 0611 10/18/18 1517 10/19/18 0257  NA 143 143 138 140  K 4.7 4.7 4.4 4.1  CL 111 109 107 103  CO2 23 23 23 29   GLUCOSE 112* 201* 275* 233*  BUN 59* 61* 60* 53*  CREATININE 3.47* 3.64* 3.63* 3.17*  CALCIUM 8.0* 7.7* 7.5* 7.4*  MG  --   --  1.2* 1.3*  PHOS  --  4.4 3.5 3.1    Liver Function Tests: Recent Labs  Lab 10/18/18 0611  AST 33  ALT 37  ALKPHOS 204*  BILITOT 1.2  PROT 6.7  ALBUMIN 3.2*   No results  for input(s): LIPASE, AMYLASE in the last 168 hours. No results for input(s): AMMONIA in the last 168 hours.  CBC: Recent Labs  Lab 10/17/18 1154 10/18/18 0611 10/19/18 0257  WBC 7.5 8.9 7.2  NEUTROABS  --  7.5 5.4  HGB 7.7* 7.4* 7.0*  HCT 24.8* 24.7* 22.8*  MCV 93.9 95.7 93.4  PLT 206 218 189    Cardiac Enzymes: Recent Labs  Lab 10/17/18 1154 10/18/18 0149  TROPONINI 0.03* 0.14*    BNP: Invalid input(s): POCBNP  CBG: Recent Labs  Lab 10/20/18 1140 10/20/18 1721 10/20/18 2118 10/21/18 0746 10/21/18 1648  GLUCAP 243* 213* 246* 155* 210*    Microbiology: Results for orders placed or performed during the hospital encounter of 10/17/18  MRSA PCR Screening     Status: None   Collection Time: 10/17/18  6:58 PM  Result Value Ref Range Status   MRSA by PCR NEGATIVE NEGATIVE Final    Comment:        The GeneXpert MRSA Assay (FDA approved for NASAL specimens only), is one component of a comprehensive MRSA colonization surveillance program. It is not intended to diagnose MRSA infection nor to guide or monitor treatment for MRSA infections. Performed at [facility], [address] [place], [place] [address]     Coagulation Studies: No results for input(s): LABPROT, INR in the last 72 hours.  Urinalysis: No results for  input(s): COLORURINE, LABSPEC, PHURINE, GLUCOSEU, HGBUR, BILIRUBINUR, KETONESUR, PROTEINUR, UROBILINOGEN, NITRITE, LEUKOCYTESUR in the last 72 hours.  Invalid input(s): APPERANCEUR    Imaging: No results found.   Medications:   . sodium chloride     . allopurinol  100 mg Oral Daily  . amLODipine  10 mg Oral Daily  . aspirin EC  81 mg Oral Daily  . atorvastatin  40 mg Oral QHS  . carvedilol  12.5 mg Oral BID WC  . chlorhexidine  15 mL Mouth Rinse BID  . cholecalciferol  1,000 Units Oral Daily  . cloNIDine  0.1 mg Oral TID  . clopidogrel  75 mg Oral Daily  . colchicine  0.6 mg Oral QODAY  . docusate sodium  100 mg Oral  BID  . dorzolamide-timolol  1 drop Both Eyes BID  . enalapril  2.5 mg Oral Daily  . epoetin (EPOGEN/PROCRIT) injection  10,000 Units Intravenous Q T,Th,Sa-HD  . feeding supplement (NEPRO CARB STEADY)  237 mL Oral BID BM  . furosemide  40 mg Oral BID  . heparin  5,000 Units Subcutaneous Q8H  . hydrALAZINE  100 mg Oral TID  . insulin aspart  0-9 Units Subcutaneous TID WC  . insulin detemir  10 Units Subcutaneous Daily  . isosorbide mononitrate  120 mg Oral Daily  . mouth rinse  15 mL Mouth Rinse q12n4p  . multivitamin  1 tablet Oral QHS  . pantoprazole  40 mg Oral BID  . sodium chloride flush  3 mL Intravenous Q12H   sodium chloride, acetaminophen **OR** acetaminophen, ALPRAZolam, fluticasone, hydrOXYzine, ipratropium-albuterol, ondansetron **OR** ondansetron (ZOFRAN) IV, oxyCODONE-acetaminophen, polyethylene glycol, simethicone, sodium chloride flush  Assessment/ Plan:  Cory Miller is a 77 y.o. black male with chronic kidney disease stage V, hypertension, peripheral vascular disease, obstructive sleep apnea, carotid stenosis, coronary artery disease status post CABG, hyperlipidemia, gout, who presents with chest pain.   1. End Stage Renal Disease: initiated hemodialysis on 11/22 through left AVF. Completed two dialysis treatments.  Next hemodialysis will be scheduled for Wednesday seated in chair Patient with diabetic nephropathy with nephrotic range proteinuria.  Outpatient planning in process. [facility].  TTS chair time assigned to patient  2. Acute pulmonary edema: with echocardiogram on 08/23/18 showing preserved systolic function Noninvasive positive pressure ventilation on admission. Continues to require high oxygen requirements.   3.  Anemia of renal failure: hemoglobin 7.4, normocytic.  Low threshold for transfusion - initiate ESA during this admission: EPO 10000 units with HD treatment first dose was 11/23.   4. Diabetes mellitus type II with chronic kidney  disease: insulin dependent. Hemoglobin A1c of 6.9% on admission - Continue glucose control   5. Secondary Hyperparathyroidism: PTH 237 on 11/22. Corrected calcium at goal. Phosphorus at goal. No indication for binders at this time.    LOS: 4 [name] 11/25/20195:08 PM

## 2018-10-21 NOTE — Progress Notes (Signed)
Post HD Treatment  Pt tolerated treatment well. His net UF was 2514 and his BVP was 51.9. He did meet his goal. All blood was returned back to patient and report called to Lyndee Leo, Primary RN.     10/21/18 1445  Hand-Off documentation  Report given to (Full Name) Lyndee Leo, RN  Report received from (Full Name) Stephannie Peters, RN  Vital Signs  Temp 98.5 F (36.9 C)  Temp Source Oral  Pulse Rate 78  Pulse Rate Source Monitor  Resp 17  BP (!) 135/51  BP Location Left Arm  BP Method Automatic  Patient Position (if appropriate) Lying  Oxygen Therapy  SpO2 100 %  O2 Device Nasal Cannula  O2 Flow Rate (L/min) 4 L/min  Pain Assessment  Pain Scale 0-10  Pain Score 0  Dialysis Weight  Weight 116.4 kg  Type of Weight Post-Dialysis  Post-Hemodialysis Assessment  Rinseback Volume (mL) 250 mL  KECN 38 V  Dialyzer Clearance Lightly streaked  Duration of HD Treatment -hour(s) 3 hour(s)  Hemodialysis Intake (mL) 500 mL  UF Total -Machine (mL) 3014 mL  Net UF (mL) 2514 mL  Tolerated HD Treatment Yes  AVG/AVF Arterial Site Held (minutes) 10 minutes  AVG/AVF Venous Site Held (minutes) 10 minutes

## 2018-10-21 NOTE — Progress Notes (Signed)
Post HD Assessment    10/21/18 1447  Neurological  Level of Consciousness Alert  Orientation Level Oriented X4  Respiratory  Respiratory Pattern Regular;Unlabored  Chest Assessment Chest expansion symmetrical  Bilateral Breath Sounds Diminished  Cardiac  Pulse Regular  Heart Sounds S1, S2;No adventitious heart sounds  Jugular Venous Distention (JVD) No  ECG Monitor Yes  Cardiac Rhythm NSR  Vascular  R Radial Pulse +2  L Radial Pulse +2  R Dorsalis Pedis Pulse +1  L Dorsalis Pedis Pulse +1  Edema Right lower extremity;Left lower extremity  RLE Edema +1  LLE Edema +1  Integumentary  Integumentary (WDL) WDL  Skin Condition Dry  Musculoskeletal  Musculoskeletal (WDL) X  Generalized Weakness Yes  GU Assessment  Genitourinary (WDL) X (HD pt)  Psychosocial  Psychosocial (WDL) WDL

## 2018-10-21 NOTE — Progress Notes (Signed)
HD Treatment Initiated    10/21/18 1123  During Hemodialysis Assessment  Blood Flow Rate (mL/min) 300 mL/min  Arterial Pressure (mmHg) -100 mmHg  Venous Pressure (mmHg) 70 mmHg  Transmembrane Pressure (mmHg) 70 mmHg  Ultrafiltration Rate (mL/min) 1000 mL/min  Dialysate Flow Rate (mL/min) 600 ml/min  Conductivity: Machine  14.1  HD Safety Checks Performed Yes  Dialysis Fluid Bolus Normal Saline  Bolus Amount (mL) 250 mL  Intra-Hemodialysis Comments Tx initiated;Progressing as prescribed

## 2018-10-21 NOTE — Progress Notes (Signed)
PT Cancellation Note  Patient Details Name: Angus Amini MRN: [id number] DOB: February 26, 1941   Cancelled Treatment:    Reason Eval/Treat Not Completed: Patient at procedure or test/unavailable.  Pt currently off floor at dialysis.  Will re-attempt PT treatment session at a later date/time.  Leitha Bleak, PT 10/21/18, 2:20 PM [phone]

## 2018-10-21 NOTE — Progress Notes (Signed)
Pre HD Assessment    10/21/18 1121  Neurological  Level of Consciousness Alert  Orientation Level Oriented X4  Respiratory  Respiratory Pattern Labored;Dyspnea with exertion  Chest Assessment Chest expansion symmetrical  Bilateral Breath Sounds Diminished  Cough Productive  Sputum Amount Small  Cardiac  Pulse Regular  Heart Sounds S1, S2;No adventitious heart sounds  Jugular Venous Distention (JVD) No  ECG Monitor Yes  Cardiac Rhythm NSR  Vascular  R Radial Pulse +2  L Radial Pulse +2  R Dorsalis Pedis Pulse +1  L Dorsalis Pedis Pulse +1  Edema Right lower extremity;Left lower extremity  RLE Edema +1  LLE Edema +1  Integumentary  Integumentary (WDL) WDL  Skin Condition Dry  Musculoskeletal  Musculoskeletal (WDL) X  Generalized Weakness Yes  GU Assessment  Genitourinary (WDL) X (HD pt)  Psychosocial  Psychosocial (WDL) WDL

## 2018-10-21 NOTE — Progress Notes (Signed)
HD Treatment Complete    10/21/18 1430  Vital Signs  Pulse Rate 73  Pulse Rate Source Monitor  Resp 17  BP (!) 131/56  BP Location Left Arm  BP Method Automatic  Patient Position (if appropriate) Lying  Oxygen Therapy  SpO2 100 %  O2 Device Nasal Cannula  O2 Flow Rate (L/min) 4 L/min  During Hemodialysis Assessment  Blood Flow Rate (mL/min) 300 mL/min  Arterial Pressure (mmHg) -120 mmHg  Venous Pressure (mmHg) 130 mmHg  Transmembrane Pressure (mmHg) 80 mmHg  Ultrafiltration Rate (mL/min) 1000 mL/min  Dialysate Flow Rate (mL/min) 600 ml/min  Conductivity: Machine  14.1  HD Safety Checks Performed Yes  Intra-Hemodialysis Comments Progressing as prescribed;Tolerated well;Tx completed (UF 3014)  Note  Observations Patient tolerated treatment well

## 2018-10-21 NOTE — Care Management Important Message (Signed)
Copy of signed IM left with patient in room.  

## 2018-10-21 NOTE — Progress Notes (Signed)
Inpatient Diabetes Program Recommendations  AACE/ADA: New Consensus Statement on Inpatient Glycemic Control (2019)  Target Ranges:  Prepandial:   less than 140 mg/dL      Peak postprandial:   less than 180 mg/dL (1-2 hours)      Critically ill patients:  140 - 180 mg/dL   Results for JAVAD, SALVA (MRN [id number]) as of 10/21/2018 11:40  Ref. Range 10/20/2018 07:25 10/20/2018 11:40 10/20/2018 17:21 10/20/2018 21:18 10/21/2018 07:46  Glucose-Capillary Latest Ref Range: 70 - 99 mg/dL 141 (H) 243 (H) 213 (H) 246 (H) 155 (H)  Results for LENORD, FRALIX (MRN [id number]) as of 10/21/2018 11:40  Ref. Range 10/18/2018 20:53  Hemoglobin A1C Latest Ref Range: 4.8 - 5.6 % 6.9 (H)   Review of Glycemic Control  Diabetes history: DM2 Outpatient Diabetes medications: Levemir 10 units daily, Novolog 10 units TID with meals Current orders for Inpatient glycemic control: Levemir 10 units daily, Novolog 0-9 units TID with meals  Inpatient Diabetes Program Recommendations:  Correction (SSI): Please consider ordering Novolog 0-5 units QHS for bedtime correction.  Diet: Added Carb Mod Renal to 2 gram sodium diet.  Insulin - Meal Coverage: If post prandial glucose is consistently greater than 180 mg/dl (despite adding carb mod to diet), please consider ordering Novolog 3 units TID with meals for meal coverage if patient eats at least 50% of meals.  Thanks, Barnie Alderman, RN, MSN, CDE Diabetes Coordinator Inpatient Diabetes Program [phone] (Team Pager from 8am to 5pm)

## 2018-10-21 NOTE — Progress Notes (Signed)
[facility] [place] NAME: Cory Miller    MR#:  [id number]  DATE OF BIRTH:  1941-06-26  SUBJECTIVE:   Patient came in with increasing shortness of breath was found with pulmonary edema was placed on BiPAP. Now on nasal cannula oxygen. Patient started on HD this admisison patient overall doing well. REVIEW OF SYSTEMS:   Review of Systems  Constitutional: Negative for chills, fever and weight loss.  HENT: Negative for ear discharge, ear pain and nosebleeds.   Eyes: Negative for blurred vision, pain and discharge.  Respiratory: Positive for shortness of breath. Negative for sputum production, wheezing and stridor.   Cardiovascular: Positive for leg swelling. Negative for palpitations, orthopnea and PND.  Gastrointestinal: Negative for abdominal pain, diarrhea, nausea and vomiting.  Genitourinary: Negative for frequency and urgency.  Musculoskeletal: Negative for back pain and joint pain.  Neurological: Negative for sensory change, speech change, focal weakness and weakness.  Psychiatric/Behavioral: Negative for depression and hallucinations. The patient is not nervous/anxious.    Tolerating Diet:yes Tolerating PT: HHPT  DRUG ALLERGIES:  No Known Allergies  VITALS:  Blood pressure (!) 114/49, pulse 72, temperature 98.3 F (36.8 C), temperature source Oral, resp. rate (!) 22, height 6\' 1"  (1.854 m), weight 115.8 kg, SpO2 100 %.  PHYSICAL EXAMINATION:   Physical Exam  GENERAL:  77 y.o.-year-old patient lying in the bed with no acute distress. obese EYES: Pupils equal, round, reactive to light and accommodation. No scleral icterus. Extraocular muscles intact.  HEENT: Head atraumatic, normocephalic. Oropharynx and nasopharynx clear.  NECK:  Supple, no jugular venous distention. No thyroid enlargement, no tenderness.  LUNGS: decreased breath sounds bilaterally, no wheezing, rales, rhonchi. No use of accessory muscles of  respiration.  CARDIOVASCULAR: S1, S2 normal. No murmurs, rubs, or gallops.  ABDOMEN: Soft, nontender, nondistended. Bowel sounds present. No organomegaly or mass.  EXTREMITIES: ++ edema b/l.    NEUROLOGIC: Cranial nerves II through XII are intact. No focal Motor or sensory deficits b/l.   PSYCHIATRIC:  patient is alert and oriented x 3.  SKIN: No obvious rash, lesion, or ulcer.   LABORATORY PANEL:  CBC Recent Labs  Lab 10/19/18 0257  WBC 7.2  HGB 7.0*  HCT 22.8*  PLT 189    Chemistries  Recent Labs  Lab 10/18/18 0611  10/19/18 0257  NA 143   < > 140  K 4.7   < > 4.1  CL 109   < > 103  CO2 23   < > 29  GLUCOSE 201*   < > 233*  BUN 61*   < > 53*  CREATININE 3.64*   < > 3.17*  CALCIUM 7.7*   < > 7.4*  MG  --    < > 1.3*  AST 33  --   --   ALT 37  --   --   ALKPHOS 204*  --   --   BILITOT 1.2  --   --    < > = values in this interval not displayed.   Cardiac Enzymes Recent Labs  Lab 10/18/18 0149  TROPONINI 0.14*   RADIOLOGY:  No results found. ASSESSMENT AND PLAN:   Cory Miller  is a 77 y.o. 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  In the emergency room chest x-ray shows pulmonary edema  1. Acute  hypoxic respiratory failure in the setting of pulmonary edema due to underlying kidney disease Case discussed with intensivist Continue BiPAP-- now on [place]  Started on hemodialysis this admission--CM to work on getting HD chair and time as out pt  2. End-stage renal disease  --pt Is now started with HD -Case discussed with Dr. Juleen China  -Avoid nephrotoxic medications -cont UF 7.9 liters removed  4.  Chest pressure: This is likely due to acute pulmonary edema -patient is symptomatic at present.  5.  Diabetes: Sliding scale Diabetes nurse consult requested Continue Levemir  6.  Essential hypertension:  -Continue Norvasc, clonidine,  enalapril, hydralazine, isosorbide  Case discussed with Care Management/Social Worker. Management plans discussed with the patient, family and they are in agreement.  CODE STATUS: full  DVT Prophylaxis: heparin  TOTAL TIME TAKING CARE OF THIS PATIENT: *30* minutes.  >50% time spent on counselling and coordination of care  DC pending hemodialysis chair and time availability.  Note: This dictation was prepared with Dragon dictation along with smaller phrase technology. Any transcriptional errors that result from this process are unintentional.  Fritzi Mandes M.D on 10/21/2018 at 12:10 PM  Between 7am to 6pm - Pager - [phone]  After 6pm go to [email or web address] - password [email or web address] [facility]  Office  [phone]  CC: Primary care physician; [name], MDPatient ID: Cory Miller, male   DOB: 02-Jun-1941, 77 y.o.   MRN: [id number]

## 2018-10-21 NOTE — Care Management (Signed)
Spoke with Estill Bamberg.  Patient has chair time and can start out patient HD on Saturday at the [facility].  Appointment at 6:30 am due to holiday.  Regular time will be 7:45.  Per Estill Bamberg, would need HD on Wednesday if planning to discharge Wednesday.

## 2018-10-22 LAB — GLUCOSE, CAPILLARY
GLUCOSE-CAPILLARY: 230 mg/dL — AB (ref 70–99)
Glucose-Capillary: 193 mg/dL — ABNORMAL HIGH (ref 70–99)
Glucose-Capillary: 254 mg/dL — ABNORMAL HIGH (ref 70–99)
Glucose-Capillary: 249 mg/dL — ABNORMAL HIGH (ref 70–99)

## 2018-10-22 MED ORDER — INSULIN ASPART 100 UNIT/ML ~~LOC~~ SOLN
6.0000 [IU] | Freq: Three times a day (TID) | SUBCUTANEOUS | Status: DC
  Administered 2018-10-22 – 2018-10-23 (×3): 6 [IU] via SUBCUTANEOUS
  Filled 2018-10-22 (×3): qty 1

## 2018-10-22 NOTE — Progress Notes (Signed)
[facility]  ROUNDING NOTE   Subjective:    Patient is doing well today Was ambulatory in the room earlier Able to eat without nausea or vomiting Denies any complaints   Objective:  Vital signs in last 24 hours:  Temp:  [96.7 F (35.9 C)-98.5 F (36.9 C)] 97.7 F (36.5 C) (11/26 1607) Pulse Rate:  [75-90] 75 (11/26 1607) Resp:  [18-19] 19 (11/26 1607) BP: (125-150)/(50-55) 125/55 (11/26 1607) SpO2:  [97 %-100 %] 97 % (11/26 1607) Weight:  [417 kg] 115 kg (11/26 0419)  Weight change: 2.4 kg Filed Weights   10/21/18 1120 10/21/18 1445 10/22/18 0419  Weight: 118.2 kg 116.4 kg 115 kg    Intake/Output: I/O last 3 completed shifts: In: 3 [I.V.:3] Out: 3114 [Urine:600; Other:2514]   Intake/Output this shift:  No intake/output data recorded.  Physical Exam: General: Critically ill  Head: +facial edema  Eyes: Anicteric,   Neck: Supple, trachea midline  Lungs:  Coarse breath sounds  Heart: Regular rate and rhythm  Abdomen:  Soft, nontender,   Extremities:  + peripheral edema.  Neurologic: Nonfocal, moving all four extremities  Skin: No lesions  Access: Left AVF +thrill +bruit    Basic Metabolic Panel: Recent Labs  Lab 10/17/18 1154 10/18/18 0611 10/18/18 1517 10/19/18 0257  NA 143 143 138 140  K 4.7 4.7 4.4 4.1  CL 111 109 107 103  CO2 23 23 23 29   GLUCOSE 112* 201* 275* 233*  BUN 59* 61* 60* 53*  CREATININE 3.47* 3.64* 3.63* 3.17*  CALCIUM 8.0* 7.7* 7.5* 7.4*  MG  --   --  1.2* 1.3*  PHOS  --  4.4 3.5 3.1    Liver Function Tests: Recent Labs  Lab 10/18/18 0611  AST 33  ALT 37  ALKPHOS 204*  BILITOT 1.2  PROT 6.7  ALBUMIN 3.2*   No results for input(s): LIPASE, AMYLASE in the last 168 hours. No results for input(s): AMMONIA in the last 168 hours.  CBC: Recent Labs  Lab 10/17/18 1154 10/18/18 0611 10/19/18 0257  WBC 7.5 8.9 7.2  NEUTROABS  --  7.5 5.4  HGB 7.7* 7.4* 7.0*  HCT 24.8* 24.7* 22.8*  MCV 93.9 95.7 93.4  PLT  206 218 189    Cardiac Enzymes: Recent Labs  Lab 10/17/18 1154 10/18/18 0149  TROPONINI 0.03* 0.14*    BNP: Invalid input(s): POCBNP  CBG: Recent Labs  Lab 10/21/18 0746 10/21/18 1648 10/21/18 2101 10/22/18 0734 10/22/18 1149  GLUCAP 155* 210* 216* 193* 254*    Microbiology: Results for orders placed or performed during the hospital encounter of 10/17/18  MRSA PCR Screening     Status: None   Collection Time: 10/17/18  6:58 PM  Result Value Ref Range Status   MRSA by PCR NEGATIVE NEGATIVE Final    Comment:        The GeneXpert MRSA Assay (FDA approved for NASAL specimens only), is one component of a comprehensive MRSA colonization surveillance program. It is not intended to diagnose MRSA infection nor to guide or monitor treatment for MRSA infections. Performed at [facility], [address] [place], [place] [address]     Coagulation Studies: No results for input(s): LABPROT, INR in the last 72 hours.  Urinalysis: No results for input(s): COLORURINE, LABSPEC, PHURINE, GLUCOSEU, HGBUR, BILIRUBINUR, KETONESUR, PROTEINUR, UROBILINOGEN, NITRITE, LEUKOCYTESUR in the last 72 hours.  Invalid input(s): APPERANCEUR    Imaging: No results found.   Medications:   . sodium chloride     .  allopurinol  100 mg Oral Daily  . amLODipine  10 mg Oral Daily  . aspirin EC  81 mg Oral Daily  . atorvastatin  40 mg Oral QHS  . carvedilol  12.5 mg Oral BID WC  . chlorhexidine  15 mL Mouth Rinse BID  . cholecalciferol  1,000 Units Oral Daily  . cloNIDine  0.1 mg Oral TID  . clopidogrel  75 mg Oral Daily  . colchicine  0.6 mg Oral QODAY  . docusate sodium  100 mg Oral BID  . dorzolamide-timolol  1 drop Both Eyes BID  . enalapril  2.5 mg Oral Daily  . epoetin (EPOGEN/PROCRIT) injection  10,000 Units Intravenous Q T,Th,Sa-HD  . feeding supplement (NEPRO CARB STEADY)  237 mL Oral BID BM  . furosemide  40 mg Oral BID  . heparin  5,000 Units Subcutaneous Q8H   . hydrALAZINE  100 mg Oral TID  . insulin aspart  0-9 Units Subcutaneous TID WC  . insulin aspart  6 Units Subcutaneous TID WC  . insulin detemir  10 Units Subcutaneous Daily  . isosorbide mononitrate  120 mg Oral Daily  . mouth rinse  15 mL Mouth Rinse q12n4p  . multivitamin  1 tablet Oral QHS  . pantoprazole  40 mg Oral BID  . sodium chloride flush  3 mL Intravenous Q12H   sodium chloride, acetaminophen **OR** acetaminophen, ALPRAZolam, fluticasone, hydrOXYzine, ipratropium-albuterol, ondansetron **OR** ondansetron (ZOFRAN) IV, oxyCODONE-acetaminophen, polyethylene glycol, simethicone, sodium chloride flush  Assessment/ Plan:  [name] is a 77 y.o. black male with chronic kidney disease stage V, hypertension, peripheral vascular disease, obstructive sleep apnea, carotid stenosis, coronary artery disease status post CABG, hyperlipidemia, gout, who presents with chest pain.   1. End Stage Renal Disease: initiated hemodialysis on 11/22 through left AVF. Completed two dialysis treatments.  Next hemodialysis will be scheduled for Wednesday seated in chair Patient with diabetic nephropathy with nephrotic range proteinuria.  Outpatient planning in process. [facility].  TTS chair time assigned to patient  2. Acute pulmonary edema: with echocardiogram on 08/23/18 showing preserved systolic function Volume status has improved significantly  3.  Anemia of renal failure: hemoglobin 7.0  Low threshold for transfusion - initiate ESA during this admission: EPO 10000 units with HD treatment  4. Diabetes mellitus type II with chronic kidney disease: insulin dependent. Hemoglobin A1c of 6.9% on admission - Continue glucose control   5. Secondary Hyperparathyroidism: PTH 237 on 11/22. Corrected calcium at goal. Phosphorus at goal. No indication for binders at this time.    LOS: 5 Cory Miller 11/26/20194:38 PM

## 2018-10-22 NOTE — Progress Notes (Signed)
Physical Therapy Treatment Patient Details Name: Cory Miller MRN: [id number] DOB: 03/06/1941 Today's Date: 10/22/2018    History of Present Illness Pt is a 77 y.o. male with a known history of CKD stage 5 approaching HD and CAD who presented to the ED due to increasing SOB, chest pressure and wheezing over the past several days.  In the emergency room chest x-ray showed pulmonary edema.  He had an echocardiogram in September which shows normal ejection fraction and no mention of diastolic heart failure.  He has stage V renal disease and may be approaching dialysis.  While in the ER patient went to stand up to urinate and became very dyspneic.  He was placed on BiPAP.  Assessment includes: Acute hypoxic respiratory failure in the setting of pulmonary edema due to underlying kidney disease, ESRD, DM, and HTN.   Of note, per cardiology no evidence of MI with elevated troponin consistent with demand ischemia and pt ok to begin ambulation.     PT Comments    Sleeping soundly this am but awoke and agreed to gait.  99% on 4 lpm.  Pt was able to transition in and out of bed without assist.  Stood and ambulated around unit x 1 with walker and min guard.  Pt did voice fatigue 1/2 way around nursing station but declined rest.  He continued to walk back to room with some decreased balance and safety awareness.  Upon sitting on bed, sats 92%.  He voiced general fatigue and discussion was had regarding awareness of fatigue and rest breaks when needed for energy conservation and general safety.  He voiced understanding.  He used a [facility] upon admission.  He has RW at home and was encouraged to use it upon discharge for general safety until strength increases.  He was able to stand to use urinal with no LOB.   Follow Up Recommendations  Home health PT     Equipment Recommendations       Recommendations for Other Services       Precautions / Restrictions Precautions Precautions: Fall Restrictions Weight  Bearing Restrictions: No    Mobility  Bed Mobility Overal bed mobility: Modified Independent             General bed mobility comments: Extra time and effort but no physical assistance required  Transfers Overall transfer level: Needs assistance Equipment used: Rolling walker (2 wheeled) Transfers: Sit to/from Stand Sit to Stand: Supervision            Ambulation/Gait Ambulation/Gait assistance: Supervision;Min guard Gait Distance (Feet): 200 Feet Assistive device: Rolling walker (2 wheeled) Gait Pattern/deviations: Step-through pattern Gait velocity: decreased   General Gait Details: OVerall does well on 4 lpm with O2 sats stable >92%.  Some fatigue noted at end of gait with decreased safety.   Stairs             Wheelchair Mobility    Modified Rankin (Stroke Patients Only)       Balance Overall balance assessment: Needs assistance Sitting-balance support: Feet supported Sitting balance-Leahy Scale: Good     Standing balance support: Bilateral upper extremity supported Standing balance-Leahy Scale: Fair Standing balance comment: relies on walker, balance deteriorated with distance/fatigue                            Cognition Arousal/Alertness: Awake/alert Behavior During Therapy: WFL for tasks assessed/performed Overall Cognitive Status: Within Functional Limits for tasks assessed  Exercises      General Comments        Pertinent Vitals/Pain Pain Assessment: No/denies pain    Home Living                      Prior Function            PT Goals (current goals can now be found in the care plan section) Progress towards PT goals: Progressing toward goals    Frequency    Min 2X/week      PT Plan Current plan remains appropriate    Co-evaluation              AM-PAC PT "6 Clicks" Mobility   Outcome Measure  Help needed turning from your back to  your side while in a flat bed without using bedrails?: None Help needed moving from lying on your back to sitting on the side of a flat bed without using bedrails?: None Help needed moving to and from a bed to a chair (including a wheelchair)?: A Little Help needed standing up from a chair using your arms (e.g., wheelchair or bedside chair)?: A Little Help needed to walk in hospital room?: A Little Help needed climbing 3-5 steps with a railing? : A Little 6 Click Score: 20    End of Session Equipment Utilized During Treatment: Gait belt;Oxygen Activity Tolerance: Patient tolerated treatment well Patient left: in bed;with call bell/phone within reach;with bed alarm set         Time: 0902-0916 PT Time Calculation (min) (ACUTE ONLY): 14 min  Charges:  $Gait Training: 8-22 mins                     Chesley Noon, PTA 10/22/18, 9:40 AM

## 2018-10-22 NOTE — Plan of Care (Signed)
  Problem: Education: Goal: Ability to demonstrate management of disease process will improve Outcome: Progressing   Problem: Cardiac: Goal: Ability to achieve and maintain adequate cardiopulmonary perfusion will improve Outcome: Progressing

## 2018-10-22 NOTE — Plan of Care (Signed)
  Problem: Education: Goal: Knowledge of General Education information will improve Description Including pain rating scale, medication(s)/side effects and non-pharmacologic comfort measures Outcome: Progressing   Problem: Health Behavior/Discharge Planning: Goal: Ability to manage health-related needs will improve Outcome: Progressing   Problem: Clinical Measurements: Goal: Respiratory complications will improve Outcome: Progressing   Problem: Activity: Goal: Risk for activity intolerance will decrease Outcome: Progressing   Problem: Nutrition: Goal: Adequate nutrition will be maintained Outcome: Progressing   Problem: Elimination: Goal: Will not experience complications related to bowel motility Outcome: Progressing   Problem: Safety: Goal: Ability to remain free from injury will improve Outcome: Progressing   Problem: Skin Integrity: Goal: Risk for impaired skin integrity will decrease Outcome: Progressing

## 2018-10-22 NOTE — Progress Notes (Signed)
[place] [facility] [place] NAME: Cory Miller    MR#:  [id number]  DATE OF BIRTH:  October 15, 1941  SUBJECTIVE:   Patient started on HD this admisison patient overall doing well. No new complaints REVIEW OF SYSTEMS:   Review of Systems  Constitutional: Negative for chills, fever and weight loss.  HENT: Negative for ear discharge, ear pain and nosebleeds.   Eyes: Negative for blurred vision, pain and discharge.  Respiratory: Positive for shortness of breath. Negative for sputum production, wheezing and stridor.   Cardiovascular: Positive for leg swelling. Negative for palpitations, orthopnea and PND.  Gastrointestinal: Negative for abdominal pain, diarrhea, nausea and vomiting.  Genitourinary: Negative for frequency and urgency.  Musculoskeletal: Negative for back pain and joint pain.  Neurological: Negative for sensory change, speech change, focal weakness and weakness.  Psychiatric/Behavioral: Negative for depression and hallucinations. The patient is not nervous/anxious.    Tolerating Diet:yes Tolerating PT: HHPT  DRUG ALLERGIES:  No Known Allergies  VITALS:  Blood pressure (!) 139/50, pulse 79, temperature 97.6 F (36.4 C), resp. rate 18, height 6\' 1"  (1.854 m), weight 115 kg, SpO2 99 %.  PHYSICAL EXAMINATION:   Physical Exam  GENERAL:  77 y.o.-year-old patient lying in the bed with no acute distress. obese EYES: Pupils equal, round, reactive to light and accommodation. No scleral icterus. Extraocular muscles intact.  HEENT: Head atraumatic, normocephalic. Oropharynx and nasopharynx clear.  NECK:  Supple, no jugular venous distention. No thyroid enlargement, no tenderness.  LUNGS: decreased breath sounds bilaterally, no wheezing, rales, rhonchi. No use of accessory muscles of respiration. HD access+ CARDIOVASCULAR: S1, S2 normal. No murmurs, rubs, or gallops.  ABDOMEN: Soft, nontender, nondistended. Bowel sounds present. No  organomegaly or mass.  EXTREMITIES: ++ edema b/l.    NEUROLOGIC: Cranial nerves II through XII are intact. No focal Motor or sensory deficits b/l.   PSYCHIATRIC:  patient is alert and oriented x 3.  SKIN: No obvious rash, lesion, or ulcer.   LABORATORY PANEL:  CBC Recent Labs  Lab 10/19/18 0257  WBC 7.2  HGB 7.0*  HCT 22.8*  PLT 189    Chemistries  Recent Labs  Lab 10/18/18 0611  10/19/18 0257  NA 143   < > 140  K 4.7   < > 4.1  CL 109   < > 103  CO2 23   < > 29  GLUCOSE 201*   < > 233*  BUN 61*   < > 53*  CREATININE 3.64*   < > 3.17*  CALCIUM 7.7*   < > 7.4*  MG  --    < > 1.3*  AST 33  --   --   ALT 37  --   --   ALKPHOS 204*  --   --   BILITOT 1.2  --   --    < > = values in this interval not displayed.   Cardiac Enzymes Recent Labs  Lab 10/18/18 0149  TROPONINI 0.14*   RADIOLOGY:  No results found. ASSESSMENT AND PLAN:   Cory Miller  is a 77 y.o. 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  In the emergency room chest x-ray shows pulmonary edema  1. Acute hypoxic respiratory failure in the setting of pulmonary edema due to underlying kidney disease Case discussed with intensivist Pt was on BiPAP--  now on [place]  (uses chronic oxygen at home) Started on hemodialysis this admission--pt has chair time for HD TTS. Will get HD tomorrow and then d/c home with out pt HD to begin on Saturday. Pt aware of the plan.  2. End-stage renal disease  --pt Is now started with HD -Case discussed with Dr. Juleen China  -Avoid nephrotoxic medications -cont UF 7.9 liters removed  4.  Chest pressure: This is likely due to acute pulmonary edema -patient is symptomatic at present.  5.  Diabetes: Sliding scale Diabetes nurse consult requested Continue Levemir  6.  Essential hypertension:  -Continue Norvasc, clonidine, enalapril, hydralazine,  isosorbide  D/c in am after HD with HHPT  Case discussed with Care Management/Social Worker. Management plans discussed with the patient, family and they are in agreement.  CODE STATUS: full  DVT Prophylaxis: heparin  TOTAL TIME TAKING CARE OF THIS PATIENT: *25* minutes.  >50% time spent on counselling and coordination of care  DC pending hemodialysis chair and time availability.  Note: This dictation was prepared with Dragon dictation along with smaller phrase technology. Any transcriptional errors that result from this process are unintentional.  Fritzi Mandes M.D on 10/22/2018 at 11:16 AM  Between 7am to 6pm - Pager - [phone]  After 6pm go to [email or web address] - password [email or web address] [facility] [place]  Office  [phone]  CC: Primary care physician; [name], MDPatient ID: Cory Miller, male   DOB: Nov 17, 1941, 77 y.o.   MRN: [id number]

## 2018-10-22 NOTE — Care Management (Signed)
Patient admitted from home with respiratory failure.  Patient lives at home alone.  Sisters live locally for provide transportation.  PCP Mancel Bale.   Patient was set up with home O2 through [place] on 08/27/18.  Patient has RW and cane in the home.  Patient is open with [facility] home health for PT only.  [place] with [facility] notified of admission.  PT has assessed patient and recommends home health PT.   Plan is for patient to dialyze tomorrow, then discharge.  Patient will have to be at outpatient HD Saturday at 6:30 am.  Elvera Bicker HD liaison to meet with patient and family to notify. Reported that patient has been able to tolerate HD while sitting.  Due to patient being hard of hearing. If anyone needs to contact the patient by phone at home Caller must dial [phone]

## 2018-10-23 LAB — GLUCOSE, CAPILLARY: Glucose-Capillary: 174 mg/dL — ABNORMAL HIGH (ref 70–99)

## 2018-10-23 MED ORDER — RENA-VITE PO TABS: 1.0000 | ORAL_TABLET | Freq: Every day | ORAL | 0 refills | Status: AC

## 2018-10-23 MED ORDER — TORSEMIDE 20 MG PO TABS: 40.0000 mg | ORAL_TABLET | Freq: Every day | ORAL | 0 refills | Status: DC

## 2018-10-23 NOTE — Progress Notes (Signed)
Pre HD assessment    10/23/18 0746  Neurological  Level of Consciousness Alert  Orientation Level Oriented X4  Respiratory  Respiratory Pattern Regular;Unlabored  Chest Assessment Chest expansion symmetrical  Cough Productive;Strong  Cardiac  ECG Monitor Yes  Cardiac Rhythm NSR  Vascular  R Radial Pulse +2  L Radial Pulse +2  Edema Generalized;Right lower extremity;Left lower extremity  Integumentary  Integumentary (WDL) X  Skin Color Appropriate for ethnicity  Musculoskeletal  Musculoskeletal (WDL) X  Generalized Weakness Yes  Assistive Device Wheelchair  GU Assessment  Genitourinary (WDL) X  Genitourinary Symptoms  (HD)  Psychosocial  Psychosocial (WDL) WDL  Needs Expressed Physical  Emotional support given Given to patient

## 2018-10-23 NOTE — Progress Notes (Signed)
[facility]  ROUNDING NOTE   Subjective:    Patient is doing well today Seen during hemodialysis Tolerating well States he ate breakfast without any nausea or vomiting   HEMODIALYSIS FLOWSHEET:  Blood Flow Rate (mL/min): 300 mL/min Arterial Pressure (mmHg): -190 mmHg Venous Pressure (mmHg): 160 mmHg Transmembrane Pressure (mmHg): 60 mmHg Ultrafiltration Rate (mL/min): 850 mL/min Dialysate Flow Rate (mL/min): 600 ml/min Conductivity: Machine : 15.2 Conductivity: Machine : 15.2 Dialysis Fluid Bolus: Normal Saline Bolus Amount (mL): 250 mL     Objective:  Vital signs in last 24 hours:  Temp:  [97.5 F (36.4 C)-98.7 F (37.1 C)] 98.1 F (36.7 C) (11/27 1206) Pulse Rate:  [68-92] 92 (11/27 1206) Resp:  [15-27] 18 (11/27 1206) BP: (108-180)/(45-90) 179/61 (11/27 1206) SpO2:  [97 %-100 %] 100 % (11/27 1206) Weight:  [113.8 kg-115.9 kg] 113.8 kg (11/27 1145)  Weight change: -2.306 kg Filed Weights   10/23/18 0406 10/23/18 0745 10/23/18 1145  Weight: 115.9 kg 115.9 kg 113.8 kg    Intake/Output: I/O last 3 completed shifts: In: 120 [P.O.:120] Out: 550 [Urine:550]   Intake/Output this shift:  Total I/O In: -  Out: 2520 [Other:2520]  Physical Exam: General:  Chronically ill-appearing  Head:  Moist oral mucous membranes  Eyes: Anicteric,   Neck: Supple,   Lungs:  Coarse breath sounds  Heart: Regular rate and rhythm  Abdomen:  Soft, nontender,   Extremities:  + peripheral edema.  Neurologic: Nonfocal, alert and able to answer questions  Skin: No lesions  Access: Left AVF +thrill +bruit    Basic Metabolic Panel: Recent Labs  Lab 10/17/18 1154 10/18/18 0611 10/18/18 1517 10/19/18 0257  NA 143 143 138 140  K 4.7 4.7 4.4 4.1  CL 111 109 107 103  CO2 23 23 23 29   GLUCOSE 112* 201* 275* 233*  BUN 59* 61* 60* 53*  CREATININE 3.47* 3.64* 3.63* 3.17*  CALCIUM 8.0* 7.7* 7.5* 7.4*  MG  --   --  1.2* 1.3*  PHOS  --  4.4 3.5 3.1    Liver  Function Tests: Recent Labs  Lab 10/18/18 0611  AST 33  ALT 37  ALKPHOS 204*  BILITOT 1.2  PROT 6.7  ALBUMIN 3.2*   No results for input(s): LIPASE, AMYLASE in the last 168 hours. No results for input(s): AMMONIA in the last 168 hours.  CBC: Recent Labs  Lab 10/17/18 1154 10/18/18 0611 10/19/18 0257  WBC 7.5 8.9 7.2  NEUTROABS  --  7.5 5.4  HGB 7.7* 7.4* 7.0*  HCT 24.8* 24.7* 22.8*  MCV 93.9 95.7 93.4  PLT 206 218 189    Cardiac Enzymes: Recent Labs  Lab 10/17/18 1154 10/18/18 0149  TROPONINI 0.03* 0.14*    BNP: Invalid input(s): POCBNP  CBG: Recent Labs  Lab 10/21/18 2101 10/22/18 0734 10/22/18 1149 10/22/18 1636 10/22/18 2339  GLUCAP 216* 193* 254* 249* 230*    Microbiology: Results for orders placed or performed during the hospital encounter of 10/17/18  MRSA PCR Screening     Status: None   Collection Time: 10/17/18  6:58 PM  Result Value Ref Range Status   MRSA by PCR NEGATIVE NEGATIVE Final    Comment:        The GeneXpert MRSA Assay (FDA approved for NASAL specimens only), is one component of a comprehensive MRSA colonization surveillance program. It is not intended to diagnose MRSA infection nor to guide or monitor treatment for MRSA infections. Performed at [facility], [address] [place],  [address]     Coagulation Studies: No results for input(s): LABPROT, INR in the last 72 hours.  Urinalysis: No results for input(s): COLORURINE, LABSPEC, PHURINE, GLUCOSEU, HGBUR, BILIRUBINUR, KETONESUR, PROTEINUR, UROBILINOGEN, NITRITE, LEUKOCYTESUR in the last 72 hours.  Invalid input(s): APPERANCEUR    Imaging: No results found.   Medications:   . sodium chloride     . allopurinol  100 mg Oral Daily  . amLODipine  10 mg Oral Daily  . aspirin EC  81 mg Oral Daily  . atorvastatin  40 mg Oral QHS  . carvedilol  12.5 mg Oral BID WC  . chlorhexidine  15 mL Mouth Rinse BID  . cholecalciferol  1,000 Units Oral  Daily  . cloNIDine  0.1 mg Oral TID  . clopidogrel  75 mg Oral Daily  . colchicine  0.6 mg Oral QODAY  . docusate sodium  100 mg Oral BID  . dorzolamide-timolol  1 drop Both Eyes BID  . enalapril  2.5 mg Oral Daily  . epoetin (EPOGEN/PROCRIT) injection  10,000 Units Intravenous Q T,Th,Sa-HD  . feeding supplement (NEPRO CARB STEADY)  237 mL Oral BID BM  . furosemide  40 mg Oral BID  . heparin  5,000 Units Subcutaneous Q8H  . hydrALAZINE  100 mg Oral TID  . insulin aspart  0-9 Units Subcutaneous TID WC  . insulin aspart  6 Units Subcutaneous TID WC  . insulin detemir  10 Units Subcutaneous Daily  . isosorbide mononitrate  120 mg Oral Daily  . mouth rinse  15 mL Mouth Rinse q12n4p  . multivitamin  1 tablet Oral QHS  . pantoprazole  40 mg Oral BID  . sodium chloride flush  3 mL Intravenous Q12H   sodium chloride, acetaminophen **OR** acetaminophen, ALPRAZolam, fluticasone, hydrOXYzine, ipratropium-albuterol, ondansetron **OR** ondansetron (ZOFRAN) IV, oxyCODONE-acetaminophen, polyethylene glycol, simethicone, sodium chloride flush  Assessment/ Plan:  [name] is a 77 y.o. black male with chronic kidney disease stage V, hypertension, peripheral vascular disease, obstructive sleep apnea, carotid stenosis, coronary artery disease status post CABG, hyperlipidemia, gout, who presents with chest pain.   1. End Stage Renal Disease: initiated hemodialysis on 11/22 through left AVF.  Completed 3 consecutive dialysis treatments.  Today's treatment in chair Outpatient planning in process. [facility].  Starting outpatient dialysis Saturday  2. Acute pulmonary edema: with echocardiogram on 08/23/18 showing preserved systolic function Volume status has improved significantly  3.  Anemia of renal failure: hemoglobin 7.0 - initiate ESA during this admission:  Continue EPO with outpatient dialysis  4. Diabetes mellitus type II with chronic kidney disease: insulin dependent.  Hemoglobin A1c of 6.9% on admission - Continue glucose control   5. Secondary Hyperparathyroidism: PTH 237 on 11/22. Corrected calcium at goal. Phosphorus at goal. No indication for binders at this time.    LOS: 6 Cory Miller 11/27/201912:06 PM

## 2018-10-23 NOTE — Plan of Care (Signed)
  Problem: Health Behavior/Discharge Planning: Goal: Ability to manage health-related needs will improve Outcome: Progressing   Problem: Safety: Goal: Ability to remain free from injury will improve Outcome: Progressing   Problem: Education: Goal: Ability to verbalize understanding of medication therapies will improve Outcome: Progressing

## 2018-10-23 NOTE — Discharge Instructions (Signed)
Go for your outpt HD TTS as instructed with [facility] in [place]

## 2018-10-23 NOTE — Care Management Note (Signed)
Case Management Note  Patient Details  Name: Cory Miller MRN: [id number] Date of Birth: 04/08/1941  Subjective/Objective:     Patient completed HD today and will discharge to home with resumption of home health services through [facility].  They are aware of discharge order.  PCP updated on chart.  He sees [name] at [facility].  Has chair time on Saturday.  Will be T, TH, Sat at [facility].   Has portable O2 at bedside for D/C.   No further needs identified at this time.                Action/Plan:   Expected Discharge Date:                  Expected Discharge Plan:  [place]  In-House Referral:     Discharge planning Services  CM Consult  Post Acute Care Choice:  Resumption of Svcs/PTA Provider Choice offered to:     DME Arranged:    DME Agency:     HH Arranged:    [place] Agency:  [facility]  Status of Service:  Completed, signed off  If discussed at [place] of Stay Meetings, dates discussed:    Additional Comments:  Elza Rafter, RN 10/23/2018, 12:15 PM

## 2018-10-23 NOTE — Progress Notes (Signed)
[name] to be D/C'd Home with home health per MD order.  Discussed prescriptions and follow up appointments with the patient and sister. Prescriptions given to patient, medication list explained in detail. Pt verbalized understanding.  Allergies as of 10/23/2018   No Known Allergies     Medication List    TAKE these medications   allopurinol 100 MG tablet Commonly known as:  ZYLOPRIM Take 100 mg by mouth daily.   amLODipine 10 MG tablet Commonly known as:  NORVASC Take 10 mg by mouth daily.   aspirin EC 81 MG tablet Take 81 mg by mouth daily.   atorvastatin 40 MG tablet Commonly known as:  LIPITOR Take 40 mg by mouth at bedtime.   carvedilol 12.5 MG tablet Commonly known as:  COREG Take 12.5 mg by mouth 2 (two) times daily with a meal.   cholecalciferol 25 MCG (1000 UT) tablet Commonly known as:  VITAMIN D Take 1,000 Units by mouth daily.   cloNIDine 0.1 MG tablet Commonly known as:  CATAPRES Take 1 tablet (0.1 mg total) by mouth 3 (three) times daily.   clopidogrel 75 MG tablet Commonly known as:  PLAVIX Take 75 mg by mouth daily.   colchicine 0.6 MG tablet Take 0.6 mg by mouth every other day.   docusate sodium 100 MG capsule Commonly known as:  COLACE Take 100 mg by mouth 2 (two) times daily.   dorzolamide-timolol 22.3-6.8 MG/ML ophthalmic solution Commonly known as:  COSOPT Place 1 drop into both eyes 2 (two) times daily.   enalapril 2.5 MG tablet Commonly known as:  VASOTEC Take 1 tablet (2.5 mg total) by mouth daily.   EQL FLAXSEED OIL 1200 MG Caps Take 1,200 mg by mouth daily.   fluticasone 50 MCG/ACT nasal spray Commonly known as:  FLONASE Place 1 spray into both nostrils daily as needed for allergies.   hydrALAZINE 100 MG tablet Commonly known as:  APRESOLINE Take 100 mg by mouth 3 (three) times daily.   hydrOXYzine 25 MG tablet Commonly known as:  ATARAX/VISTARIL Take 25 mg by mouth 4 (four) times daily as needed for itching.    insulin aspart 100 UNIT/ML FlexPen Commonly known as:  NOVOLOG Inject 10 Units into the skin 3 (three) times daily with meals.   insulin detemir 100 UNIT/ML injection Commonly known as:  LEVEMIR Inject 0.1 mLs (10 Units total) into the skin daily. What changed:  when to take this   isosorbide mononitrate 120 MG 24 hr tablet Commonly known as:  IMDUR Take 120 mg by mouth daily.   ketoconazole 2 % shampoo Commonly known as:  NIZORAL Apply 1 application topically 2 (two) times a week.   multivitamin Tabs tablet Take 1 tablet by mouth at bedtime.   oxyCODONE-acetaminophen 5-325 MG tablet Commonly known as:  PERCOCET/ROXICET Take 1 tablet by mouth 3 (three) times daily.   pantoprazole 40 MG tablet Commonly known as:  PROTONIX Take 1 tablet (40 mg total) by mouth daily. 40 mg BID for 1 month followed by once daily What changed:    when to take this  additional instructions   simethicone 80 MG chewable tablet Commonly known as:  MYLICON Chew 2 tablets (160 mg total) by mouth 4 (four) times daily as needed for flatulence.   sodium bicarbonate 650 MG tablet Take 650 mg by mouth 2 (two) times daily.   SPS 15 GM/60ML suspension Generic drug:  sodium polystyrene Take 60 mLs by mouth as directed. When eating potassium rich foods  torsemide 20 MG tablet Commonly known as:  DEMADEX Take 2 tablets (40 mg total) by mouth daily.   triamcinolone cream 0.1 % Commonly known as:  KENALOG Apply 1 application topically 2 (two) times daily as needed (ITCHING).       Vitals:   10/23/18 1206 10/23/18 1312  BP: (!) 179/61 (!) 135/49  Pulse: 92 95  Resp: 18 18  Temp: 98.1 F (36.7 C)   SpO2: 100% 100%    Tele box removed and returned. Skin clean, dry and intact without evidence of skin break down, no evidence of skin tears noted. IV catheter discontinued intact. Site without signs and symptoms of complications. Dressing and pressure applied. Pt denies pain at this time. No  complaints noted.  An After Visit Summary was printed and given to the patient. Patient escorted via [place], and D/C home via private auto.Sister will transport pt home.  Rolley Sims

## 2018-10-23 NOTE — Discharge Summary (Signed)
[facility] NAME: Errik Mitchelle    MR#:  [id number]  DATE OF BIRTH:  [birth year]  DATE OF ADMISSION:  10/17/2018 ADMITTING PHYSICIAN: Bettey Costa, MD  DATE OF DISCHARGE: 10/23/2018  PRIMARY CARE PHYSICIAN: No primary care provider on file.    ADMISSION DIAGNOSIS:  Shortness of breath [R06.02] Acute pulmonary edema (HCC) [J81.0] Respiratory failure (HCC) [J96.90]  DISCHARGE DIAGNOSIS:  Acute Pulmonary edema/volume overload--now better ESRD now on Hemodialysis  SECONDARY DIAGNOSIS:   Past Medical History:  Diagnosis Date  . Blood dyscrasia    prostate  . CAD (coronary artery disease) of artery bypass graft   . Carotid atherosclerosis   . Carotid stenosis   . Chronic kidney disease   . CKD (chronic kidney disease), stage V ([place])   . CVA (cerebrovascular accident) ([place])   . Diabetes mellitus without complication ([place])   . Dyspnea   . ED (erectile dysfunction)   . Elevated troponin I level   . Esophageal ulcer   . GERD (gastroesophageal reflux disease)   . Gout   . Hard of hearing    Pt very hard  of hearing  . Hyperlipidemia   . Hypertension   . MI (myocardial infarction) ([place])   . Neuropathy   . Occlusion of both internal carotid arteries   . Prostate CA ([place])   . Sleep apnea     HOSPITAL COURSE:  StevesonParkeris a77 y.o.malewith a known history of CKD stage 5 approaching HD and CAd who presents to the ED due to increasing SOB, chest pressureand wheezingover the past several days. In the emergency room chest x-ray shows pulmonary edema  1. Acute hypoxic respiratory failure in the setting of pulmonary edema due to underlying kidney disease -was on BiPAP-- now on [place]  Started on hemodialysis this admission--CM got the HD chair and time as out pt with [facility] in [place]  2. End-stage renal disease  --pt Is now started with HD -Case discussed with Dr. Juleen China  -Avoid nephrotoxic  medications -cont UF 7.9 liters removed  4. Chest pressure: This is likely due to acute pulmonary edema -patient is symptomatic at present.  5. Diabetes: Sliding scale Diabetes nurse consult requested Continue Levemir  6. Essential hypertension:  -Continue Norvasc, clonidine, enalapril, hydralazine, isosorbide  Overall improved. Will d/c later to home with HHPT Pt agreeable D/w Dr Candiss Norse CONSULTS OBTAINED:  Treatment Team:  Corey Skains, MD  DRUG ALLERGIES:  No Known Allergies  DISCHARGE MEDICATIONS:   Allergies as of 10/23/2018   No Known Allergies     Medication List    TAKE these medications   allopurinol 100 MG tablet Commonly known as:  ZYLOPRIM Take 100 mg by mouth daily.   amLODipine 10 MG tablet Commonly known as:  NORVASC Take 10 mg by mouth daily.   aspirin EC 81 MG tablet Take 81 mg by mouth daily.   atorvastatin 40 MG tablet Commonly known as:  LIPITOR Take 40 mg by mouth at bedtime.   carvedilol 12.5 MG tablet Commonly known as:  COREG Take 12.5 mg by mouth 2 (two) times daily with a meal.   cholecalciferol 25 MCG (1000 UT) tablet Commonly known as:  VITAMIN D Take 1,000 Units by mouth daily.   cloNIDine 0.1 MG tablet Commonly known as:  CATAPRES Take 1 tablet (0.1 mg total) by mouth 3 (three) times daily.   clopidogrel 75 MG tablet Commonly known as:  PLAVIX Take 75 mg by mouth  daily.   colchicine 0.6 MG tablet Take 0.6 mg by mouth every other day.   docusate sodium 100 MG capsule Commonly known as:  COLACE Take 100 mg by mouth 2 (two) times daily.   dorzolamide-timolol 22.3-6.8 MG/ML ophthalmic solution Commonly known as:  COSOPT Place 1 drop into both eyes 2 (two) times daily.   enalapril 2.5 MG tablet Commonly known as:  VASOTEC Take 1 tablet (2.5 mg total) by mouth daily.   EQL FLAXSEED OIL 1200 MG Caps Take 1,200 mg by mouth daily.   fluticasone 50 MCG/ACT nasal spray Commonly known as:  FLONASE Place 1  spray into both nostrils daily as needed for allergies.   hydrALAZINE 100 MG tablet Commonly known as:  APRESOLINE Take 100 mg by mouth 3 (three) times daily.   hydrOXYzine 25 MG tablet Commonly known as:  ATARAX/VISTARIL Take 25 mg by mouth 4 (four) times daily as needed for itching.   insulin aspart 100 UNIT/ML FlexPen Commonly known as:  NOVOLOG Inject 10 Units into the skin 3 (three) times daily with meals.   insulin detemir 100 UNIT/ML injection Commonly known as:  LEVEMIR Inject 0.1 mLs (10 Units total) into the skin daily. What changed:  when to take this   isosorbide mononitrate 120 MG 24 hr tablet Commonly known as:  IMDUR Take 120 mg by mouth daily.   ketoconazole 2 % shampoo Commonly known as:  NIZORAL Apply 1 application topically 2 (two) times a week.   multivitamin Tabs tablet Take 1 tablet by mouth at bedtime.   oxyCODONE-acetaminophen 5-325 MG tablet Commonly known as:  PERCOCET/ROXICET Take 1 tablet by mouth 3 (three) times daily.   pantoprazole 40 MG tablet Commonly known as:  PROTONIX Take 1 tablet (40 mg total) by mouth daily. 40 mg BID for 1 month followed by once daily What changed:    when to take this  additional instructions   simethicone 80 MG chewable tablet Commonly known as:  MYLICON Chew 2 tablets (160 mg total) by mouth 4 (four) times daily as needed for flatulence.   sodium bicarbonate 650 MG tablet Take 650 mg by mouth 2 (two) times daily.   SPS 15 GM/60ML suspension Generic drug:  sodium polystyrene Take 60 mLs by mouth as directed. When eating potassium rich foods   torsemide 20 MG tablet Commonly known as:  DEMADEX Take 2 tablets (40 mg total) by mouth daily.   triamcinolone cream 0.1 % Commonly known as:  KENALOG Apply 1 application topically 2 (two) times daily as needed ([place]).       If you experience worsening of your admission symptoms, develop shortness of breath, life threatening emergency, suicidal or  homicidal thoughts you must seek medical attention immediately by calling 911 or calling your MD immediately  if symptoms less severe.  You Must read complete instructions/literature along with all the possible adverse reactions/side effects for all the Medicines you take and that have been prescribed to you. Take any new Medicines after you have completely understood and accept all the possible adverse reactions/side effects.   Please note  You were cared for by a hospitalist during your hospital stay. If you have any questions about your discharge medications or the care you received while you were in the hospital after you are discharged, you can call the unit and asked to speak with the hospitalist on call if the hospitalist that took care of you is not available. Once you are discharged, your primary care physician will handle  any further medical issues. Please note that NO REFILLS for any discharge medications will be authorized once you are discharged, as it is imperative that you return to your primary care physician (or establish a relationship with a primary care physician if you do not have one) for your aftercare needs so that they can reassess your need for medications and monitor your lab values. Today   SUBJECTIVE   Seen at HD Doing well  VITAL SIGNS:  Blood pressure (!) 175/56, pulse 80, temperature 98.2 F (36.8 C), temperature source Oral, resp. rate (!) 23, height 6\' 1"  (1.854 m), weight 115.9 kg, SpO2 100 %.  I/O:    Intake/Output Summary (Last 24 hours) at 10/23/2018 1132 Last data filed at 10/23/2018 0700 Gross per 24 hour  Intake 120 ml  Output 450 ml  Net -330 ml    PHYSICAL EXAMINATION:  GENERAL:  77 y.o.-year-old patient lying in the bed with no acute distress.  EYES: Pupils equal, round, reactive to light and accommodation. No scleral icterus. Extraocular muscles intact.  HEENT: Head atraumatic, normocephalic. Oropharynx and nasopharynx clear.  NECK:   Supple, no jugular venous distention. No thyroid enlargement, no tenderness.  LUNGS: Normal breath sounds bilaterally, no wheezing, rales,rhonchi or crepitation. No use of accessory muscles of respiration.  CARDIOVASCULAR: S1, S2 normal. No murmurs, rubs, or gallops.  ABDOMEN: Soft, non-tender, non-distended. Bowel sounds present. No organomegaly or mass.  EXTREMITIES: +pedal edema, no cyanosis, or clubbing.  NEUROLOGIC: Cranial nerves II through XII are intact. Muscle strength 5/5 in all extremities. Sensation intact. Gait not checked.  PSYCHIATRIC:  patient is alert and oriented x 3.  SKIN: No obvious rash, lesion, or ulcer.   DATA REVIEW:   CBC  Recent Labs  Lab 10/19/18 0257  WBC 7.2  HGB 7.0*  HCT 22.8*  PLT 189    Chemistries  Recent Labs  Lab 10/18/18 0611  10/19/18 0257  NA 143   < > 140  K 4.7   < > 4.1  CL 109   < > 103  CO2 23   < > 29  GLUCOSE 201*   < > 233*  BUN 61*   < > 53*  CREATININE 3.64*   < > 3.17*  CALCIUM 7.7*   < > 7.4*  MG  --    < > 1.3*  AST 33  --   --   ALT 37  --   --   ALKPHOS 204*  --   --   BILITOT 1.2  --   --    < > = values in this interval not displayed.    Microbiology Results   Recent Results (from the past 240 hour(s))  MRSA PCR Screening     Status: None   Collection Time: 10/17/18  6:58 PM  Result Value Ref Range Status   MRSA by PCR NEGATIVE NEGATIVE Final    Comment:        The GeneXpert MRSA Assay (FDA approved for NASAL specimens only), is one component of a comprehensive MRSA colonization surveillance program. It is not intended to diagnose MRSA infection nor to guide or monitor treatment for MRSA infections. Performed at [facility], [address], [place], [place] [address]     RADIOLOGY:  No results found.   Management plans discussed with the patient, family and they are in agreement.  CODE STATUS:     Code Status Orders  (From admission, onward)  Start     Ordered    10/17/18 1412  Full code  Continuous     10/17/18 1411        Code Status History    Date Active Date Inactive Code Status Order ID Comments User Context   08/22/2018 1546 08/27/2018 1922 Full Code [id number]  Saundra Shelling, MD ED   04/04/2016 0119 04/04/2016 1817 Full Code [id number]  Lance Coon, MD Inpatient   08/02/2015 1404 08/06/2015 1727 Full Code [id number]  Bettey Costa, MD Inpatient      TOTAL TIME TAKING CARE OF THIS PATIENT: *40* minutes.    Fritzi Mandes M.D on 10/23/2018 at 11:32 AM  Between 7am to 6pm - Pager - [phone] After 6pm go to [email or web address] - password [email or web address] [facility]  Office  [phone]  CC: Primary care physician; No primary care provider on file.

## 2018-10-23 NOTE — Progress Notes (Signed)
HD tx end    10/23/18 1136  Vital Signs  Pulse Rate 81  Pulse Rate Source Monitor  Resp (!) 25  BP (!) 174/55  BP Location Left Arm  BP Method Automatic  Patient Position (if appropriate) Sitting  Oxygen Therapy  SpO2 100 %  O2 Device Nasal Cannula  O2 Flow Rate (L/min) 4 L/min  During Hemodialysis Assessment  Dialysis Fluid Bolus Normal Saline  Bolus Amount (mL) 250 mL  Intra-Hemodialysis Comments Tx completed

## 2018-10-23 NOTE — Care Management Important Message (Signed)
Copy of signed Medicare IM left in patient's room (out for procedure).  

## 2018-10-23 NOTE — Progress Notes (Signed)
Post HD assessment    10/23/18 1144  Neurological  Level of Consciousness Alert  Orientation Level Oriented X4  Respiratory  Respiratory Pattern Regular;Unlabored  Chest Assessment Chest expansion symmetrical  Cough Productive;Strong  Cardiac  ECG Monitor Yes  Cardiac Rhythm NSR  Vascular  R Radial Pulse +2  L Radial Pulse +2  Edema Generalized;Right lower extremity;Left lower extremity  Integumentary  Integumentary (WDL) X  Skin Color Appropriate for ethnicity  Musculoskeletal  Musculoskeletal (WDL) X  Generalized Weakness Yes  Assistive Device None  GU Assessment  Genitourinary (WDL) X  Genitourinary Symptoms  (HD)  Psychosocial  Psychosocial (WDL) WDL  Needs Expressed Physical  Emotional support given Given to patient

## 2018-10-23 NOTE — Progress Notes (Signed)
CBC every 3 days with prophylactic Heparin.

## 2018-10-23 NOTE — Progress Notes (Signed)
Post HD assessment. Pt tolerated tx well without c/o or complication. Net UF 2520, goal met   10/23/18 1145  Vital Signs  Temp 98.7 F (37.1 C)  Temp Source Oral  Pulse Rate 83  Pulse Rate Source Monitor  Resp (!) 22  BP (!) 177/53  BP Location Left Arm  BP Method Automatic  Patient Position (if appropriate) Sitting  Oxygen Therapy  SpO2 98 %  O2 Device Nasal Cannula  O2 Flow Rate (L/min) 4 L/min  Dialysis Weight  Weight 113.8 kg  Type of Weight Post-Dialysis  Post-Hemodialysis Assessment  Rinseback Volume (mL) 250 mL  KECN 59 V  Dialyzer Clearance Lightly streaked  Duration of HD Treatment -hour(s) 3.5 hour(s)  Hemodialysis Intake (mL) 500 mL  UF Total -Machine (mL) 3020 mL  Net UF (mL) 2520 mL  Tolerated HD Treatment Yes  AVG/AVF Arterial Site Held (minutes) 10 minutes  AVG/AVF Venous Site Held (minutes) 10 minutes  Education / Care Plan  Dialysis Education Provided Yes  Documented Education in Care Plan Yes  Fistula / Graft Right Upper arm Arteriovenous fistula  Placement Date/Time: 08/22/18 2000   Placed prior to admission: Yes  Orientation: Right  Access Location: Upper arm  Access Type: Arteriovenous fistula  Site Condition No complications  Fistula / Graft Assessment Present;Thrill;Bruit  Status Deaccessed  Drainage Description None

## 2018-10-23 NOTE — Progress Notes (Signed)
Inpatient Diabetes Program Recommendations  AACE/ADA: New Consensus Statement on Inpatient Glycemic Control (2019)  Target Ranges:  Prepandial:   less than 140 mg/dL      Peak postprandial:   less than 180 mg/dL (1-2 hours)      Critically ill patients:  140 - 180 mg/dL  Results for TORELL, MINDER (MRN [id number]) as of 10/23/2018 09:34  Ref. Range 10/22/2018 07:34 10/22/2018 11:49 10/22/2018 16:36 10/22/2018 23:39  Glucose-Capillary Latest Ref Range: 70 - 99 mg/dL 193 (H) 254 (H) 249 (H) 230 (H)   Diabetes history: DM2  Outpatient Diabetes medications: Levemir 10 units daily, Novolog 10 units TID with meals Current orders for Inpatient glycemic control: Levemir 10 units daily, Novolog 0-9 units TID with meals, Novolog 6 units TID with meals  Inpatient Diabetes Program Recommendations:   Correction (SSI): Please consider ordering Novolog 0-5 units QHS for bedtime correction.  Insulin - Meal Coverage: Novolog 6 units TID with meals for meal coverage is ordered and post prandial glucose remains elevated. Please consider increasing meal coverage to Novolog 8 units TID with meals if patient eats at least 50% of meals.  Thanks, Barnie Alderman, RN, MSN, CDE Diabetes Coordinator Inpatient Diabetes Program [phone] (Team Pager from 8am to 5pm)

## 2018-10-23 NOTE — Progress Notes (Signed)
Pre HD assessment    10/23/18 0745  Vital Signs  Temp 98.2 F (36.8 C)  Temp Source Oral  Pulse Rate 86  Pulse Rate Source Monitor  Resp (!) 22  BP (!) 166/58  BP Location Left Arm  BP Method Automatic  Patient Position (if appropriate) Sitting  Oxygen Therapy  SpO2 100 %  O2 Device Nasal Cannula  O2 Flow Rate (L/min) 4 L/min  Pain Assessment  Pain Scale 0-10  Pain Score 0  Dialysis Weight  Weight 115.9 kg  Type of Weight Pre-Dialysis  Time-Out for Hemodialysis  What Procedure? HD  Pt Identifiers(min of two) First/Last Name;MRN/Account#  Correct Site? Yes  Correct Side? Yes  Correct Procedure? Yes  Consents Verified? Yes  Rad Studies Available? N/A  Safety Precautions Reviewed? Yes  Research scientist (physical sciences)  (2A)  Station Number 4  UF/Alarm Test Passed  Conductivity: Meter 14  Conductivity: Machine  14.2  pH 7.6  Reverse Osmosis main  Normal Saline Lot Number [id number]  Dialyzer Lot Number [id number]  Disposable Set Lot Number [id number]  Machine Temperature 98.6 F (37 C)  Musician and Audible Yes  Blood Lines Intact and Secured Yes  Pre Treatment Patient Checks  Vascular access used during treatment Fistula  Patient is receiving dialysis in a chair Yes  Hepatitis B Surface Antigen Results Negative  Date Hepatitis B Surface Antigen Drawn 10/18/18  Hepatitis B Surface Antibody  (<10)  Date Hepatitis B Surface Antibody Drawn 10/18/18  Hemodialysis Consent Verified Yes  Hemodialysis Standing Orders Initiated Yes  ECG (Telemetry) Monitor On Yes  Prime Ordered Normal Saline  Length of  DialysisTreatment -hour(s) 3.5 Hour(s)  Dialyzer Elisio 17H NR  Dialysate 3K, 2.5 Ca  Dialysis Anticoagulant None  Dialysate Flow Ordered 600  Blood Flow Rate Ordered 300 mL/min  Ultrafiltration Goal 2.5 Liters  Dialysis Blood Pressure Support Ordered Normal Saline  Education / Care Plan  Dialysis Education Provided Yes  Documented Education in Care Plan Yes   Fistula / Graft Right Upper arm Arteriovenous fistula  Placement Date/Time: 08/22/18 2000   Placed prior to admission: Yes  Orientation: Right  Access Location: Upper arm  Access Type: Arteriovenous fistula  Site Condition No complications;Other (Comment) (some bruising present )  Fistula / Graft Assessment Present;Thrill;Bruit  Drainage Description None

## 2018-10-23 NOTE — Progress Notes (Signed)
HD tx start    10/23/18 0758  Vital Signs  Pulse Rate 77  Pulse Rate Source Monitor  Resp 20  BP (!) 155/58  BP Location Left Arm  BP Method Automatic  Patient Position (if appropriate) Sitting  Oxygen Therapy  SpO2 100 %  O2 Device Nasal Cannula  O2 Flow Rate (L/min) 4 L/min  During Hemodialysis Assessment  Blood Flow Rate (mL/min) 300 mL/min  Arterial Pressure (mmHg) -160 mmHg  Venous Pressure (mmHg) 130 mmHg  Transmembrane Pressure (mmHg) 70 mmHg  Ultrafiltration Rate (mL/min) 860 mL/min  Dialysate Flow Rate (mL/min) 600 ml/min  Conductivity: Machine  14.2  HD Safety Checks Performed Yes  Dialysis Fluid Bolus Normal Saline  Bolus Amount (mL) 250 mL  Intra-Hemodialysis Comments Tx initiated  Fistula / Graft Right Upper arm Arteriovenous fistula  Placement Date/Time: 08/22/18 2000   Placed prior to admission: Yes  Orientation: Right  Access Location: Upper arm  Access Type: Arteriovenous fistula  Status Accessed  Needle Size 16

## 2018-10-26 ENCOUNTER — Other Ambulatory Visit: Payer: Self-pay

## 2018-10-26 ENCOUNTER — Ambulatory Visit
Admission: EM | Admit: 2018-10-26 | Discharge: 2018-10-26 | Disposition: A | Discharge status: Home / Self Care | Payer: Medicare PPO | Attending: Family Medicine | Admitting: Family Medicine

## 2018-10-26 DIAGNOSIS — H6502 Acute serous otitis media, left ear: Secondary | ICD-10-CM

## 2018-10-26 MED ORDER — AMOXICILLIN-POT CLAVULANATE 500-125 MG PO TABS: 1.0000 | ORAL_TABLET | Freq: Every day | ORAL | 0 refills | Status: DC

## 2018-10-26 NOTE — Discharge Instructions (Signed)
Antibiotic as prescribed.  Take care  Dr. Shelia Magallon  

## 2018-10-26 NOTE — ED Provider Notes (Signed)
[facility]    CSN: [id number] Arrival date & time: 10/26/18  1255 History   Chief Complaint Chief Complaint  Patient presents with  . Otalgia   HPI  77 year old male presents with left otalgia.  Patient has recently been admitted to the hospital.  His son states that the past 4 to 5 days he has had severe left ear pain.  Patient wears hearing aids but not in that ear.  He is very hard of hearing.  Son states that his pain is 8-9/10 in severity.  History is obtained primarily from the son as he has difficulty hearing me.  No fever.  No medications or interventions tried.  No other associated symptoms.  No other complaints.  PMH, Surgical Hx, Family Hx, Social History reviewed and updated as below.  Past Medical History:  Diagnosis Date  . Blood dyscrasia    prostate  . CAD (coronary artery disease) of artery bypass graft   . Carotid atherosclerosis   . Carotid stenosis   . Chronic kidney disease   . CKD (chronic kidney disease), stage V ([place])   . CVA (cerebrovascular accident) ([place])   . Diabetes mellitus without complication ([place])   . Dyspnea   . ED (erectile dysfunction)   . Elevated troponin I level   . Esophageal ulcer   . GERD (gastroesophageal reflux disease)   . Gout   . Hard of hearing    Pt very hard  of hearing  . Hyperlipidemia   . Hypertension   . MI (myocardial infarction) ([place])   . Neuropathy   . Occlusion of both internal carotid arteries   . Prostate CA ([place])   . Sleep apnea     Patient Active Problem List   Diagnosis Date Noted  . SOB (shortness of breath) 10/17/2018  . Pressure injury of skin 08/23/2018  . Respiratory failure ([place]) 08/22/2018  . Sepsis ([place]) 04/04/2016  . Diabetes ([place]) 04/04/2016  . HTN (hypertension) 04/04/2016  . GERD (gastroesophageal reflux disease) 04/04/2016  . CAD (coronary artery disease) 04/04/2016  . CKD (chronic kidney disease), stage IV ([place]) 04/04/2016  . Abdominal pain 08/02/2015  . Abdominal  pain, acute, epigastric   . Pain in the abdomen     Past Surgical History:  Procedure Laterality Date  . ARTERIAL BYPASS SURGRY    . AV FISTULA PLACEMENT Right 06/14/2018   Procedure: ARTERIOVENOUS (AV) FISTULA CREATION(brachiocephalic);  Surgeon: Katha Cabal, MD;  Location: [facility] ORS;  Service: Vascular;  Laterality: Right;  . CAROTID ENDARTERECTOMY Left   . chest tubes x 2     for pneumothorax  . COLONOSCOPY WITH PROPOFOL N/A 06/02/2016   Procedure: COLONOSCOPY WITH PROPOFOL;  Surgeon: Lollie Sails, MD;  Location: [facility] ENDOSCOPY;  Service: Endoscopy;  Laterality: N/A;  . COLONOSCOPY WITH PROPOFOL N/A 07/28/2016   Procedure: COLONOSCOPY WITH PROPOFOL;  Surgeon: Lollie Sails, MD;  Location: [facility] ENDOSCOPY;  Service: Endoscopy;  Laterality: N/A;  . CORONARY ANGIOPLASTY    . CORONARY ARTERY BYPASS GRAFT    . decortication,parietal pleurectomy Left 05/2002  . ESOPHAGOGASTRODUODENOSCOPY N/A 08/05/2015   Procedure: ESOPHAGOGASTRODUODENOSCOPY (EGD);  Surgeon: Hulen Luster, MD;  Location: [facility] ENDOSCOPY;  Service: Endoscopy;  Laterality: N/A;  . ESOPHAGOGASTRODUODENOSCOPY (EGD) WITH PROPOFOL N/A 09/30/2015   Procedure: ESOPHAGOGASTRODUODENOSCOPY (EGD) WITH PROPOFOL;  Surgeon: Hulen Luster, MD;  Location: [facility] ENDOSCOPY;  Service: Gastroenterology;  Laterality: N/A;  . ostate cryoablation    . partial thoracoscopy  6/03  Home Medications    Prior to Admission medications   Medication Sig Start Date End Date Taking? Authorizing Provider  allopurinol (ZYLOPRIM) 100 MG tablet Take 100 mg by mouth daily.    [provider]  amLODipine (NORVASC) 10 MG tablet Take 10 mg by mouth daily.    [provider]  amoxicillin-clavulanate (AUGMENTIN) 500-125 MG tablet Take 1 tablet (500 mg total) by mouth daily. On days receiving dialysis, take after dialysis. 10/26/18   Coral Spikes, DO  aspirin EC 81 MG tablet Take 81 mg by mouth daily.    [provider]    atorvastatin (LIPITOR) 40 MG tablet Take 40 mg by mouth at bedtime.    [provider]  carvedilol (COREG) 12.5 MG tablet Take 12.5 mg by mouth 2 (two) times daily with a meal.  09/25/17   [provider]  cholecalciferol (VITAMIN D) 1000 units tablet Take 1,000 Units by mouth daily.     [provider]  cloNIDine (CATAPRES) 0.1 MG tablet Take 1 tablet (0.1 mg total) by mouth 3 (three) times daily. 08/27/18   Vaughan Basta, MD  clopidogrel (PLAVIX) 75 MG tablet Take 75 mg by mouth daily.    [provider]  colchicine 0.6 MG tablet Take 0.6 mg by mouth every other day.     [provider]  docusate sodium (COLACE) 100 MG capsule Take 100 mg by mouth 2 (two) times daily.    [provider]  dorzolamide-timolol (COSOPT) 22.3-6.8 MG/ML ophthalmic solution Place 1 drop into both eyes 2 (two) times daily.  02/28/18   [provider]  enalapril (VASOTEC) 2.5 MG tablet Take 1 tablet (2.5 mg total) by mouth daily. 08/28/18   Vaughan Basta, MD  Flaxseed, Linseed, (EQL FLAXSEED OIL) 1200 MG CAPS Take 1,200 mg by mouth daily.    [provider]  fluticasone (FLONASE) 50 MCG/ACT nasal spray Place 1 spray into both nostrils daily as needed for allergies.     [provider]  hydrALAZINE (APRESOLINE) 100 MG tablet Take 100 mg by mouth 3 (three) times daily.     [provider]  hydrOXYzine (ATARAX/VISTARIL) 25 MG tablet Take 25 mg by mouth 4 (four) times daily as needed for itching.     [provider]  insulin aspart (NOVOLOG) 100 UNIT/ML FlexPen Inject 10 Units into the skin 3 (three) times daily with meals.     [provider]  insulin detemir (LEVEMIR) 100 UNIT/ML injection Inject 0.1 mLs (10 Units total) into the skin daily. Patient taking differently: Inject 10 Units into the skin at bedtime.  08/28/18   Vaughan Basta, MD  isosorbide mononitrate (IMDUR) 120 MG 24 hr tablet Take  120 mg by mouth daily.    [provider]  ketoconazole (NIZORAL) 2 % shampoo Apply 1 application topically 2 (two) times a week.  11/30/17   [provider]  multivitamin (RENA-VIT) TABS tablet Take 1 tablet by mouth at bedtime. 10/23/18   Fritzi Mandes, MD  oxyCODONE-acetaminophen (PERCOCET/ROXICET) 5-325 MG per tablet Take 1 tablet by mouth 3 (three) times daily.    [provider]  pantoprazole (PROTONIX) 40 MG tablet Take 1 tablet (40 mg total) by mouth daily. 40 mg BID for 1 month followed by once daily Patient taking differently: Take 40 mg by mouth 2 (two) times daily.  08/06/15   Max Sane, MD  simethicone (MYLICON) 80 MG chewable tablet Chew 2 tablets (160 mg total) by mouth 4 (four) times daily  as needed for flatulence. 08/27/18   Vaughan Basta, MD  sodium bicarbonate 650 MG tablet Take 650 mg by mouth 2 (two) times daily.    [provider]  SPS 15 GM/60ML suspension Take 60 mLs by mouth as directed. When eating potassium rich foods 06/05/18   [provider]  torsemide (DEMADEX) 20 MG tablet Take 2 tablets (40 mg total) by mouth daily. 10/23/18   Fritzi Mandes, MD  triamcinolone cream (KENALOG) 0.1 % Apply 1 application topically 2 (two) times daily as needed (ITCHING).     [provider]    Family History Family History  Problem Relation Age of Onset  . CAD Unknown   . Diabetes Unknown   . Cancer Unknown   . Stroke Unknown     Social History Social History   Tobacco Use  . Smoking status: Former Smoker    Types: Cigarettes  . Smokeless tobacco: Never Used  Substance Use Topics  . Alcohol use: Not Currently    Alcohol/week: 0.0 standard drinks  . Drug use: No     Allergies   Patient has no known allergies.  Review of Systems Review of Systems  Constitutional: Negative for fever.  HENT: Positive for ear pain.    Physical Exam Triage Vital Signs ED Triage Vitals  Enc Vitals Group     BP 10/26/18 1310  (!) 131/58     Pulse Rate 10/26/18 1310 88     Resp 10/26/18 1310 (!) 24     Temp 10/26/18 1310 97.7 F (36.5 C)     Temp Source 10/26/18 1310 Oral     SpO2 10/26/18 1310 100 %     Weight 10/26/18 1314 257 lb 15 oz (117 kg)     Height 10/26/18 1314 6' (1.829 m)     Head Circumference --      Peak Flow --      Pain Score 10/26/18 1315 8     Pain Loc --      Pain Edu? --      Excl. in [place]? --    Updated Vital Signs BP (!) 131/58 (BP Location: Left Arm)   Pulse 88   Temp 97.7 F (36.5 C) (Oral)   Resp (!) 24   Ht 6' (1.829 m)   Wt 117 kg   SpO2 100%   BMI 34.98 kg/m   Visual Acuity Right Eye Distance:   Left Eye Distance:   Bilateral Distance:    Right Eye Near:   Left Eye Near:    Bilateral Near:     Physical Exam  Constitutional: He appears well-developed and well-nourished. No distress.  HENT:  Head: Normocephalic and atraumatic.  Left TM with erythema and effusion.  Eyes: Conjunctivae are normal. Right eye exhibits no discharge. Left eye exhibits no discharge.  Cardiovascular: Normal rate and regular rhythm.  Pulmonary/Chest: Effort normal. No respiratory distress.  Neurological: He is alert.  Psychiatric: He has a normal mood and affect. His behavior is normal.  Nursing note and vitals reviewed.  UC Treatments / Results  Labs (all labs ordered are listed, but only abnormal results are displayed) Labs Reviewed - No data to display  EKG None  Radiology No results found.  Procedures Procedures (including critical care time)  Medications Ordered in UC Medications - No data to display  Initial Impression / Assessment and Plan / UC Course  I have reviewed the triage vital signs and the nursing notes.  Pertinent labs & imaging results that  were available during my care of the patient were reviewed by me and considered in my medical decision making (see chart for details).    77 year old male presents with otitis media. Augmentin (renally dosed) as  prescribed.  Final Clinical Impressions(s) / UC Diagnoses   Final diagnoses:  Acute serous otitis media of left ear, recurrence not specified     Discharge Instructions     Antibiotic as prescribed.  Take care  Dr. Lacinda Axon    ED Prescriptions    Medication Sig Dispense Auth. Provider   amoxicillin-clavulanate (AUGMENTIN) 500-125 MG tablet Take 1 tablet (500 mg total) by mouth daily. On days receiving dialysis, take after dialysis. 7 tablet Coral Spikes, DO     Controlled Substance Prescriptions [place] Controlled Substance Registry consulted? Not Applicable   Coral Spikes, DO 10/26/18 1421

## 2018-10-26 NOTE — ED Triage Notes (Signed)
Pt with 4 days of left ear pain

## 2018-11-11 ENCOUNTER — Encounter (INDEPENDENT_AMBULATORY_CARE_PROVIDER_SITE_OTHER): Payer: Medicare PPO

## 2018-11-11 ENCOUNTER — Ambulatory Visit (INDEPENDENT_AMBULATORY_CARE_PROVIDER_SITE_OTHER): Payer: Medicare PPO | Admitting: Vascular Surgery

## 2018-12-09 ENCOUNTER — Other Ambulatory Visit: Payer: Self-pay

## 2018-12-09 ENCOUNTER — Encounter: Payer: Self-pay | Admitting: Intensive Care

## 2018-12-09 ENCOUNTER — Emergency Department
Admission: EM | Admit: 2018-12-09 | Discharge: 2018-12-09 | Disposition: A | Discharge status: Home / Self Care | Payer: Medicare PPO | Attending: Emergency Medicine | Admitting: Emergency Medicine

## 2018-12-09 DIAGNOSIS — Y828 Other medical devices associated with adverse incidents: Secondary | ICD-10-CM | POA: Diagnosis not present

## 2018-12-09 DIAGNOSIS — I129 Hypertensive chronic kidney disease with stage 1 through stage 4 chronic kidney disease, or unspecified chronic kidney disease: Secondary | ICD-10-CM | POA: Insufficient documentation

## 2018-12-09 DIAGNOSIS — Z794 Long term (current) use of insulin: Secondary | ICD-10-CM | POA: Diagnosis not present

## 2018-12-09 DIAGNOSIS — Z79899 Other long term (current) drug therapy: Secondary | ICD-10-CM | POA: Insufficient documentation

## 2018-12-09 DIAGNOSIS — E1122 Type 2 diabetes mellitus with diabetic chronic kidney disease: Secondary | ICD-10-CM | POA: Diagnosis not present

## 2018-12-09 DIAGNOSIS — Z7982 Long term (current) use of aspirin: Secondary | ICD-10-CM | POA: Insufficient documentation

## 2018-12-09 DIAGNOSIS — Z951 Presence of aortocoronary bypass graft: Secondary | ICD-10-CM | POA: Insufficient documentation

## 2018-12-09 DIAGNOSIS — Z8546 Personal history of malignant neoplasm of prostate: Secondary | ICD-10-CM | POA: Insufficient documentation

## 2018-12-09 DIAGNOSIS — T82590A Other mechanical complication of surgically created arteriovenous fistula, initial encounter: Secondary | ICD-10-CM | POA: Diagnosis present

## 2018-12-09 DIAGNOSIS — Z7902 Long term (current) use of antithrombotics/antiplatelets: Secondary | ICD-10-CM | POA: Insufficient documentation

## 2018-12-09 DIAGNOSIS — I2510 Atherosclerotic heart disease of native coronary artery without angina pectoris: Secondary | ICD-10-CM | POA: Insufficient documentation

## 2018-12-09 DIAGNOSIS — I252 Old myocardial infarction: Secondary | ICD-10-CM | POA: Diagnosis not present

## 2018-12-09 DIAGNOSIS — N184 Chronic kidney disease, stage 4 (severe): Secondary | ICD-10-CM | POA: Insufficient documentation

## 2018-12-09 DIAGNOSIS — Z87891 Personal history of nicotine dependence: Secondary | ICD-10-CM | POA: Diagnosis not present

## 2018-12-09 NOTE — ED Provider Notes (Signed)
[facility] Emergency Department Provider Note  ____________________________________________   First MD Initiated Contact with Patient 12/09/18 1825     (approximate)  I have reviewed the triage vital signs and the nursing notes.   HISTORY  Chief Complaint Vascular Access Problem   HPI Cory Miller is a 78 y.o. male with a history of CKD on dialysis who is presenting with oozing from his right upper extremity dialysis fistula.  Patient says that he usually has oozing from the more distal needlestick but that despite several hours of pressure he has having persistent oozing today.  Patient on Plavix.  No other complaints.  Arrives via EMS with a clamp on his right upper extremity.   Past Medical History:  Diagnosis Date  . Blood dyscrasia    prostate  . CAD (coronary artery disease) of artery bypass graft   . Carotid atherosclerosis   . Carotid stenosis   . Chronic kidney disease   . CKD (chronic kidney disease), stage V ([place])   . CVA (cerebrovascular accident) ([place])   . Diabetes mellitus without complication ([place])   . Dyspnea   . ED (erectile dysfunction)   . Elevated troponin I level   . Esophageal ulcer   . GERD (gastroesophageal reflux disease)   . Gout   . Hard of hearing    Pt very hard  of hearing  . Hyperlipidemia   . Hypertension   . MI (myocardial infarction) ([place])   . Neuropathy   . Occlusion of both internal carotid arteries   . Prostate CA ([place])   . Sleep apnea     Patient Active Problem List   Diagnosis Date Noted  . SOB (shortness of breath) 10/17/2018  . Pressure injury of skin 08/23/2018  . Respiratory failure ([place]) 08/22/2018  . Sepsis ([place]) 04/04/2016  . Diabetes ([place]) 04/04/2016  . HTN (hypertension) 04/04/2016  . GERD (gastroesophageal reflux disease) 04/04/2016  . CAD (coronary artery disease) 04/04/2016  . CKD (chronic kidney disease), stage IV ([place]) 04/04/2016  . Abdominal pain 08/02/2015  . Abdominal  pain, acute, epigastric   . Pain in the abdomen     Past Surgical History:  Procedure Laterality Date  . ARTERIAL BYPASS SURGRY    . AV FISTULA PLACEMENT Right 06/14/2018   Procedure: ARTERIOVENOUS (AV) FISTULA CREATION(brachiocephalic);  Surgeon: Katha Cabal, MD;  Location: [facility] ORS;  Service: Vascular;  Laterality: Right;  . CAROTID ENDARTERECTOMY Left   . chest tubes x 2     for pneumothorax  . COLONOSCOPY WITH PROPOFOL N/A 06/02/2016   Procedure: COLONOSCOPY WITH PROPOFOL;  Surgeon: Lollie Sails, MD;  Location: [facility] ENDOSCOPY;  Service: Endoscopy;  Laterality: N/A;  . COLONOSCOPY WITH PROPOFOL N/A 07/28/2016   Procedure: COLONOSCOPY WITH PROPOFOL;  Surgeon: Lollie Sails, MD;  Location[facility] ENDOSCOPY;  Service: Endoscopy;  Laterality: N/A;  . CORONARY ANGIOPLASTY    . CORONARY ARTERY BYPASS GRAFT    . decortication,parietal pleurectomy Left 05/2002  . ESOPHAGOGASTRODUODENOSCOPY N/A 08/05/2015   Procedure: ESOPHAGOGASTRODUODENOSCOPY (EGD);  Surgeon: Hulen Luster, MD;  Location: [facility] ENDOSCOPY;  Service: Endoscopy;  Laterality: N/A;  . ESOPHAGOGASTRODUODENOSCOPY (EGD) WITH PROPOFOL N/A 09/30/2015   Procedure: ESOPHAGOGASTRODUODENOSCOPY (EGD) WITH PROPOFOL;  Surgeon: Hulen Luster, MD;  Location: [facility] ENDOSCOPY;  Service: Gastroenterology;  Laterality: N/A;  . ostate cryoablation    . partial thoracoscopy  6/03    Prior to Admission medications   Medication Sig Start Date End Date Taking? Authorizing Provider  allopurinol (ZYLOPRIM)  100 MG tablet Take 100 mg by mouth daily.    [provider]  amLODipine (NORVASC) 10 MG tablet Take 10 mg by mouth daily.    [provider]  amoxicillin-clavulanate (AUGMENTIN) 500-125 MG tablet Take 1 tablet (500 mg total) by mouth daily. On days receiving dialysis, take after dialysis. 10/26/18   Coral Spikes, DO  aspirin EC 81 MG tablet Take 81 mg by mouth daily.    [provider]  atorvastatin (LIPITOR) 40 MG tablet  Take 40 mg by mouth at bedtime.    [provider]  carvedilol (COREG) 12.5 MG tablet Take 12.5 mg by mouth 2 (two) times daily with a meal.  09/25/17   [provider]  cholecalciferol (VITAMIN D) 1000 units tablet Take 1,000 Units by mouth daily.     [provider]  cloNIDine (CATAPRES) 0.1 MG tablet Take 1 tablet (0.1 mg total) by mouth 3 (three) times daily. 08/27/18   Vaughan Basta, MD  clopidogrel (PLAVIX) 75 MG tablet Take 75 mg by mouth daily.    [provider]  colchicine 0.6 MG tablet Take 0.6 mg by mouth every other day.     [provider]  docusate sodium (COLACE) 100 MG capsule Take 100 mg by mouth 2 (two) times daily.    [provider]  dorzolamide-timolol (COSOPT) 22.3-6.8 MG/ML ophthalmic solution Place 1 drop into both eyes 2 (two) times daily.  02/28/18   [provider]  enalapril (VASOTEC) 2.5 MG tablet Take 1 tablet (2.5 mg total) by mouth daily. 08/28/18   Vaughan Basta, MD  Flaxseed, Linseed, (EQL FLAXSEED OIL) 1200 MG CAPS Take 1,200 mg by mouth daily.    [provider]  fluticasone (FLONASE) 50 MCG/ACT nasal spray Place 1 spray into both nostrils daily as needed for allergies.     [provider]  hydrALAZINE (APRESOLINE) 100 MG tablet Take 100 mg by mouth 3 (three) times daily.     [provider]  hydrOXYzine (ATARAX/VISTARIL) 25 MG tablet Take 25 mg by mouth 4 (four) times daily as needed for itching.     [provider]  insulin aspart (NOVOLOG) 100 UNIT/ML FlexPen Inject 10 Units into the skin 3 (three) times daily with meals.     [provider]  insulin detemir (LEVEMIR) 100 UNIT/ML injection Inject 0.1 mLs (10 Units total) into the skin daily. Patient taking differently: Inject 10 Units into the skin at bedtime.  08/28/18   Vaughan Basta, MD  isosorbide mononitrate (IMDUR) 120 MG 24 hr tablet Take 120 mg by mouth daily.    [provider]  ketoconazole (NIZORAL) 2 % shampoo Apply 1 application topically 2 (two) times a week.  11/30/17   [provider]  multivitamin (RENA-VIT) TABS tablet Take 1 tablet by mouth at bedtime. 10/23/18   Fritzi Mandes, MD  oxyCODONE-acetaminophen (PERCOCET/ROXICET) 5-325 MG per tablet Take 1 tablet by mouth 3 (three) times daily.    [provider]  pantoprazole (PROTONIX) 40 MG tablet Take 1 tablet (40 mg total) by mouth daily. 40 mg BID for 1 month followed by once daily Patient taking differently: Take 40 mg by mouth 2 (two) times daily.  08/06/15   Max Sane, MD  simethicone (MYLICON) 80 MG chewable tablet Chew 2 tablets (160 mg total) by mouth 4 (four) times daily as needed for flatulence. 08/27/18   Vaughan Basta, MD  sodium bicarbonate 650 MG tablet Take 650 mg by mouth 2 (two) times  daily.    [provider]  SPS 15 GM/60ML suspension Take 60 mLs by mouth as directed. When eating potassium rich foods 06/05/18   [provider]  torsemide (DEMADEX) 20 MG tablet Take 2 tablets (40 mg total) by mouth daily. 10/23/18   Fritzi Mandes, MD  triamcinolone cream (KENALOG) 0.1 % Apply 1 application topically 2 (two) times daily as needed (ITCHING).     [provider]    Allergies Patient has no known allergies.  Family History  Problem Relation Age of Onset  . CAD Other   . Diabetes Other   . Cancer Other   . Stroke Other     Social History Social History   Tobacco Use  . Smoking status: Former Smoker    Types: Cigarettes  . Smokeless tobacco: Never Used  Substance Use Topics  . Alcohol use: Not Currently    Alcohol/week: 0.0 standard drinks  . Drug use: No    Review of Systems  Constitutional: No fever/chills Eyes: No visual changes. ENT: No sore throat. Cardiovascular: Denies chest pain. Respiratory: Denies shortness of breath. Gastrointestinal: No abdominal pain.  No nausea, no vomiting.  No diarrhea.  No  constipation. Genitourinary: Negative for dysuria. Musculoskeletal: Negative for back pain. Skin: Negative for rash. Neurological: Negative for headaches, focal weakness or numbness.   ____________________________________________   PHYSICAL EXAM:  VITAL SIGNS: ED Triage Vitals  Enc Vitals Group     BP 12/09/18 1833 (!) 180/66     Pulse Rate 12/09/18 1833 95     Resp 12/09/18 1833 14     Temp 12/09/18 1833 97.9 F (36.6 C)     Temp Source 12/09/18 1833 Oral     SpO2 12/09/18 1833 97 %     Weight 12/09/18 1834 252 lb (114.3 kg)     Height 12/09/18 1834 6\' 1"  (1.854 m)     Head Circumference --      Peak Flow --      Pain Score 12/09/18 1833 0     Pain Loc --      Pain Edu? --      Excl. in [place]? --     Constitutional: Alert and oriented. Well appearing and in no acute distress. Eyes: Conjunctivae are normal.  Head: Atraumatic. Nose: No congestion/rhinnorhea. Mouth/Throat: Mucous membranes are moist.  Neck: No stridor.   Cardiovascular: Normal rate, regular rhythm. Grossly normal heart sounds.  Good peripheral circulation with palpable thrill of distal aspect of the right upper extremity dialysis fistula.  I removed the clamp as well as the gauze and tape from the dialysis fistula.  Patient without any active bleeding at this time.  Placed a piece of Surgicel over the distal cannula site and then placed gauze and tape over that as well as gauze and tape over the proximal side but without any Surgicel over the proximal site.  Still with palpable thrill.  Respiratory: Normal respiratory effort.  No retractions. Lungs CTAB. Gastrointestinal: Soft and nontender. No distention.  Musculoskeletal: No lower extremity tenderness nor edema.  No joint effusions. Neurologic:  Normal speech and language. No gross focal neurologic deficits are appreciated. Skin:  Skin is warm, dry and intact. No rash noted. Psychiatric: Mood and affect are normal. Speech and behavior are  normal.  ____________________________________________   LABS (all labs ordered are listed, but only abnormal results are displayed)  Labs Reviewed - No data to display ____________________________________________  EKG   ____________________________________________  RADIOLOGY   ____________________________________________  PROCEDURES  Procedure(s) performed:   Procedures  Critical Care performed:   ____________________________________________   INITIAL IMPRESSION / ASSESSMENT AND PLAN / ED COURSE  Pertinent labs & imaging results that were available during my care of the patient were reviewed by me and considered in my medical decision making (see chart for details).  DDX: Dialysis fistula malfunction, bleeding fistula, coagulopathy As part of my medical decision making, I reviewed the following data within the electronic MEDICAL RECORD NUMBER Notes from prior ED visits  Patient's bleeding appears to have resolved and there is still a thrill to the fistula.  I do not want to place a stitch at this time as the bleeding has stopped and this will cause additional trauma and likely further bleeding/complication.  Patient is understanding of the diagnosis well treatment and willing to comply P will be discharged at this time. ____________________________________________   FINAL CLINICAL IMPRESSION(S) / ED DIAGNOSES  Bleeding dialysis fistula.  NEW MEDICATIONS STARTED DURING THIS VISIT:  New Prescriptions   No medications on file     Note:  This document was prepared using Dragon voice recognition software and may include unintentional dictation errors.     Orbie Pyo, MD 12/09/18 [phone]

## 2018-12-09 NOTE — ED Triage Notes (Signed)
Patient arrived from dialysis in [place]. Patients access site would not stop slowly oozing after finishing treatment. Staff reports it usually takes about an hour to stop. Bleeding started at 4:10pm and continued until 5:30pm and then staff called EMS. Patient denies CP/SOB or any complaints. Wears 2L O2 as needed at home. Takes plavix. EMS blood pressure 190/63

## 2018-12-09 NOTE — ED Notes (Signed)
Denies CP or SOB at this time

## 2018-12-24 ENCOUNTER — Encounter (INDEPENDENT_AMBULATORY_CARE_PROVIDER_SITE_OTHER): Payer: Medicare PPO

## 2018-12-24 ENCOUNTER — Encounter (INDEPENDENT_AMBULATORY_CARE_PROVIDER_SITE_OTHER): Payer: Self-pay | Admitting: Nurse Practitioner

## 2018-12-24 ENCOUNTER — Ambulatory Visit (INDEPENDENT_AMBULATORY_CARE_PROVIDER_SITE_OTHER): Payer: Medicare PPO

## 2018-12-24 ENCOUNTER — Ambulatory Visit (INDEPENDENT_AMBULATORY_CARE_PROVIDER_SITE_OTHER): Payer: Medicare PPO | Admitting: Nurse Practitioner

## 2018-12-24 VITALS — BP 158/72 | HR 86 | Resp 18 | Ht 73.0 in | Wt 252.4 lb

## 2018-12-24 DIAGNOSIS — N186 End stage renal disease: Secondary | ICD-10-CM | POA: Diagnosis not present

## 2018-12-24 DIAGNOSIS — Z87891 Personal history of nicotine dependence: Secondary | ICD-10-CM | POA: Diagnosis not present

## 2018-12-24 DIAGNOSIS — K219 Gastro-esophageal reflux disease without esophagitis: Secondary | ICD-10-CM | POA: Diagnosis not present

## 2018-12-24 DIAGNOSIS — N184 Chronic kidney disease, stage 4 (severe): Secondary | ICD-10-CM

## 2018-12-24 DIAGNOSIS — I25118 Atherosclerotic heart disease of native coronary artery with other forms of angina pectoris: Secondary | ICD-10-CM

## 2018-12-26 ENCOUNTER — Encounter (INDEPENDENT_AMBULATORY_CARE_PROVIDER_SITE_OTHER): Payer: Self-pay | Admitting: Nurse Practitioner

## 2018-12-26 ENCOUNTER — Telehealth (INDEPENDENT_AMBULATORY_CARE_PROVIDER_SITE_OTHER): Payer: Self-pay | Admitting: Nurse Practitioner

## 2018-12-26 DIAGNOSIS — N186 End stage renal disease: Secondary | ICD-10-CM | POA: Insufficient documentation

## 2018-12-26 NOTE — Progress Notes (Signed)
Subjective:    Patient ID: Cory Miller, male    DOB: 1941-04-04, 78 y.o.   MRN: [id number] Chief Complaint  Patient presents with  . Follow-up    HPI  Cory Miller is a 78 y.o. male presents today from his dialysis center with concern deal due to hand pain.  However, the patient denies any hand pain while he is on dialysis.  He denies any current issues while he is on dialysis other than recent bruising and infiltration.  He denies any fever, chills, nausea, vomiting or diarrhea.  He denies any chest pain or shortness of breath.  Today he underwent a hemodialysis access duplex which revealed a flow volume of 1543.  There was no evidence of significant velocity increases.  There appears to be no evidence of steal in the right arm fistula.  No significant change from the previous exam on 08/08/2018.  Is also noted that the flow volume is increased from previous exam.  Past Medical History:  Diagnosis Date  . Blood dyscrasia    prostate  . CAD (coronary artery disease) of artery bypass graft   . Carotid atherosclerosis   . Carotid stenosis   . Chronic kidney disease   . CKD (chronic kidney disease), stage V ([place])   . CVA (cerebrovascular accident) ([place])   . Diabetes mellitus without complication ([place])   . Dyspnea   . ED (erectile dysfunction)   . Elevated troponin I level   . Esophageal ulcer   . GERD (gastroesophageal reflux disease)   . Gout   . Hard of hearing    Pt very hard  of hearing  . Hyperlipidemia   . Hypertension   . MI (myocardial infarction) ([place])   . Neuropathy   . Occlusion of both internal carotid arteries   . Prostate CA ([place])   . Sleep apnea     Past Surgical History:  Procedure Laterality Date  . ARTERIAL BYPASS SURGRY    . AV FISTULA PLACEMENT Right 06/14/2018   Procedure: ARTERIOVENOUS (AV) FISTULA CREATION(brachiocephalic);  Surgeon: Katha Cabal, MD;  Location: [facility] ORS;  Service: Vascular;  Laterality: Right;  . CAROTID ENDARTERECTOMY  Left   . chest tubes x 2     for pneumothorax  . COLONOSCOPY WITH PROPOFOL N/A 06/02/2016   Procedure: COLONOSCOPY WITH PROPOFOL;  Surgeon: Lollie Sails, MD;  Location: [facility] ENDOSCOPY;  Service: Endoscopy;  Laterality: N/A;  . COLONOSCOPY WITH PROPOFOL N/A 07/28/2016   Procedure: COLONOSCOPY WITH PROPOFOL;  Surgeon: Lollie Sails, MD;  Location: [facility] ENDOSCOPY;  Service: Endoscopy;  Laterality: N/A;  . CORONARY ANGIOPLASTY    . CORONARY ARTERY BYPASS GRAFT    . decortication,parietal pleurectomy Left 05/2002  . ESOPHAGOGASTRODUODENOSCOPY N/A 08/05/2015   Procedure: ESOPHAGOGASTRODUODENOSCOPY (EGD);  Surgeon: Hulen Luster, MD;  Location: [facility] ENDOSCOPY;  Service: Endoscopy;  Laterality: N/A;  . ESOPHAGOGASTRODUODENOSCOPY (EGD) WITH PROPOFOL N/A 09/30/2015   Procedure: ESOPHAGOGASTRODUODENOSCOPY (EGD) WITH PROPOFOL;  Surgeon: Hulen Luster, MD;  Location[facility] ENDOSCOPY;  Service: Gastroenterology;  Laterality: N/A;  . ostate cryoablation    . partial thoracoscopy  6/03    Social History   Socioeconomic History  . Marital status: Divorced    Spouse name: Not on file  . Number of children: Not on file  . Years of education: Not on file  . Highest education level: Not on file  Occupational History  . Not on file  Social Needs  . Financial resource strain: Not on file  . Food  insecurity:    Worry: Not on file    Inability: Not on file  . Transportation needs:    Medical: Not on file    Non-medical: Not on file  Tobacco Use  . Smoking status: Former Smoker    Types: Cigarettes  . Smokeless tobacco: Never Used  Substance and Sexual Activity  . Alcohol use: Not Currently    Alcohol/week: 0.0 standard drinks  . Drug use: No  . Sexual activity: Not on file  Lifestyle  . Physical activity:    Days per week: Not on file    Minutes per session: Not on file  . Stress: Not on file  Relationships  . Social connections:    Talks on phone: Not on file    Gets together: Not on file     Attends religious service: Not on file    Active member of club or organization: Not on file    Attends meetings of clubs or organizations: Not on file    Relationship status: Not on file  . Intimate partner violence:    Fear of current or ex partner: Not on file    Emotionally abused: Not on file    Physically abused: Not on file    Forced sexual activity: Not on file  Other Topics Concern  . Not on file  Social History Narrative  . Not on file    Family History  Problem Relation Age of Onset  . CAD Other   . Diabetes Other   . Cancer Other   . Stroke Other     No Known Allergies   Review of Systems   Review of Systems: Negative Unless Checked Constitutional: [] Weight loss  [] Fever  [] Chills Cardiac: [] Chest pain   []  Atrial Fibrillation  [] Palpitations   [] Shortness of breath when laying flat   [] Shortness of breath with exertion. [] Shortness of breath at rest Vascular:  [] Pain in legs with walking   [] Pain in legs with standing [] Pain in legs when laying flat   [] Claudication    [] Pain in feet when laying flat    [] History of DVT   [] Phlebitis   [] Swelling in legs   [] Varicose veins   [] Non-healing ulcers Pulmonary:   [] Uses home oxygen   [] Productive cough   [] Hemoptysis   [] Wheeze  [x] COPD   [] Asthma Neurologic:  [] Dizziness   [] Seizures  [] Blackouts [] History of stroke   [] History of TIA  [] Aphasia   [] Temporary Blindness   [] Weakness or numbness in arm   [] Weakness or numbness in leg Musculoskeletal:   [] Joint swelling   [] Joint pain   [] Low back pain  []  History of Knee Replacement [] Arthritis [] back Surgeries  []  Spinal Stenosis    Hematologic:  [] Easy bruising  [] Easy bleeding   [] Hypercoagulable state   [x] Anemic Gastrointestinal:  [] Diarrhea   [] Vomiting  [x] Gastroesophageal reflux/heartburn   [] Difficulty swallowing. [] Abdominal pain Genitourinary:  [x] Chronic kidney disease   [] Difficult urination  [] Anuric   [] Blood in urine [] Frequent urination  [] Burning with  urination   [] Hematuria Skin:  [] Rashes   [] Ulcers [] Wounds Psychological:  [] History of anxiety   []  History of major depression  []  Memory Difficulties     Objective:   Physical Exam  BP (!) 158/72 (BP Location: Left Arm, Patient Position: Sitting, Cuff Size: Normal)   Pulse 86   Resp 18   Ht 6\' 1"  (1.854 m)   Wt 252 lb 6.4 oz (114.5 kg)   BMI 33.30 kg/m   Gen: WD/WN,  NAD Head: [place]/AT, No temporalis wasting.  Ear/Nose/Throat: Hearing grossly intact, nares w/o erythema or drainage.  Hard of hearing Eyes: PER, EOMI, sclera nonicteric.  Neck: Supple, no masses.  No JVD.  Pulmonary:  Good air movement, no use of accessory muscles.  Cardiac: RRR Vascular:  Thrill and bruit Vessel Right Left  Radial Palpable Palpable   Gastrointestinal: soft, non-distended. No guarding/no peritoneal signs.  Musculoskeletal: M/S 5/5 throughout.  No deformity or atrophy.  Neurologic: Pain and light touch intact in extremities.  Symmetrical.  Speech is fluent. Motor exam as listed above. Psychiatric: Judgment intact, Mood & affect appropriate for pt's clinical situation. Dermatologic: No Venous rashes. No Ulcers Noted.  No changes consistent with cellulitis. Lymph : No Cervical lymphadenopathy, no lichenification or skin changes of chronic lymphedema.      Assessment & Plan:   1. End stage renal disease ([place]) Recommend:  The patient is doing well and currently has adequate dialysis access. The patient's dialysis center is not reporting any access issues. Flow pattern is stable when compared to the prior ultrasound.  The patient should have a duplex ultrasound of the dialysis access in 6 months. The patient will follow-up with me in the office after each ultrasound    - VAS [place] (AVF, AVG); Future  2. Gastroesophageal reflux disease without esophagitis Continue PPI as already ordered, this medication has been reviewed and there are no changes at this time.  Avoidence  of caffeine and alcohol  Moderate elevation of the head of the bed   3. Coronary artery disease of native artery of native heart with stable angina pectoris (HCC) Continue cardiac and antihypertensive medications as already ordered and reviewed, no changes at this time.  Continue statin as ordered and reviewed, no changes at this time  Nitrates PRN for chest pain     Current Outpatient Medications on File Prior to Visit  Medication Sig Dispense Refill  . amLODipine (NORVASC) 10 MG tablet Take 10 mg by mouth daily.    [name] atorvastatin (LIPITOR) 40 MG tablet Take 40 mg by mouth at bedtime.    . carvedilol (COREG) 12.5 MG tablet Take 12.5 mg by mouth 2 (two) times daily with a meal.     . cholecalciferol (VITAMIN D) 1000 units tablet Take 1,000 Units by mouth daily.     . cloNIDine (CATAPRES) 0.1 MG tablet Take 1 tablet (0.1 mg total) by mouth 3 (three) times daily. 60 tablet 11  . clopidogrel (PLAVIX) 75 MG tablet Take 75 mg by mouth daily.    . colchicine 0.6 MG tablet Take 0.6 mg by mouth every other day.     . docusate sodium (COLACE) 100 MG capsule Take 100 mg by mouth 2 (two) times daily.    . dorzolamide-timolol (COSOPT) 22.3-6.8 MG/ML ophthalmic solution Place 1 drop into both eyes 2 (two) times daily.     . enalapril (VASOTEC) 2.5 MG tablet Take 1 tablet (2.5 mg total) by mouth daily. 30 tablet 0  . Flaxseed, Linseed, (EQL FLAXSEED OIL) 1200 MG CAPS Take 1,200 mg by mouth daily.    . hydrALAZINE (APRESOLINE) 100 MG tablet Take 100 mg by mouth 3 (three) times daily.     . hydrOXYzine (ATARAX/VISTARIL) 25 MG tablet Take 25 mg by mouth 4 (four) times daily as needed for itching.     . insulin aspart (NOVOLOG) 100 UNIT/ML FlexPen Inject 10 Units into the skin 3 (three) times daily with meals.     . insulin  detemir (LEVEMIR) 100 UNIT/ML injection Inject 0.1 mLs (10 Units total) into the skin daily. (Patient taking differently: Inject 10 Units into the skin at bedtime. ) 10 mL 11  .  isosorbide mononitrate (IMDUR) 120 MG 24 hr tablet Take 120 mg by mouth daily.    . multivitamin (RENA-VIT) TABS tablet Take 1 tablet by mouth at bedtime. 30 tablet 0  . nitroGLYCERIN (NITROSTAT) 0.4 MG SL tablet DISSOLVE 1 TABLET UNDER TONGUE FOR CHEST PAIN. May repeat every 5 min. for a max of 3 DOSES, If no relief CALL 911    . oxyCODONE-acetaminophen (PERCOCET/ROXICET) 5-325 MG per tablet Take 1 tablet by mouth 3 (three) times daily.    . pantoprazole (PROTONIX) 40 MG tablet Take 1 tablet (40 mg total) by mouth daily. 40 mg BID for 1 month followed by once daily (Patient taking differently: Take 40 mg by mouth 2 (two) times daily. ) 60 tablet 0  . sodium bicarbonate 650 MG tablet Take 650 mg by mouth 2 (two) times daily.    . SPS 15 GM/60ML suspension Take 60 mLs by mouth as directed. When eating potassium rich foods  3  . torsemide (DEMADEX) 20 MG tablet Take 2 tablets (40 mg total) by mouth daily. 30 tablet 0  . ketoconazole (NIZORAL) 2 % shampoo Apply 1 application topically 2 (two) times a week.     . simethicone (MYLICON) 80 MG chewable tablet Chew 2 tablets (160 mg total) by mouth 4 (four) times daily as needed for flatulence. (Patient not taking: Reported on 12/24/2018) 30 tablet 0  . triamcinolone cream (KENALOG) 0.1 % Apply 1 application topically 2 (two) times daily as needed ([place]).      No current facility-administered medications on file prior to visit.     There are no Patient Instructions on file for this visit. No follow-ups on file.   Kris Hartmann, NP  This note was completed with Sales executive.  Any errors are purely unintentional.

## 2018-12-26 NOTE — Telephone Encounter (Signed)
I spoke with angela regarding the patient and how his fistula appeared at this last visit.

## 2019-03-26 ENCOUNTER — Encounter (INDEPENDENT_AMBULATORY_CARE_PROVIDER_SITE_OTHER): Payer: Medicare PPO

## 2019-03-26 ENCOUNTER — Ambulatory Visit (INDEPENDENT_AMBULATORY_CARE_PROVIDER_SITE_OTHER): Payer: Medicare PPO | Admitting: Nurse Practitioner

## 2019-03-27 ENCOUNTER — Ambulatory Visit (INDEPENDENT_AMBULATORY_CARE_PROVIDER_SITE_OTHER): Payer: Medicare PPO | Admitting: Nurse Practitioner

## 2019-03-27 ENCOUNTER — Encounter (INDEPENDENT_AMBULATORY_CARE_PROVIDER_SITE_OTHER): Payer: Medicare PPO

## 2019-04-11 ENCOUNTER — Emergency Department
Admission: EM | Admit: 2019-04-11 | Discharge: 2019-04-12 | Disposition: A | Discharge status: Other Acute Inpatient Hospital | Payer: Medicare PPO | Attending: Emergency Medicine | Admitting: Emergency Medicine

## 2019-04-11 ENCOUNTER — Emergency Department: Payer: Medicare PPO

## 2019-04-11 ENCOUNTER — Other Ambulatory Visit: Payer: Self-pay

## 2019-04-11 DIAGNOSIS — E119 Type 2 diabetes mellitus without complications: Secondary | ICD-10-CM | POA: Diagnosis not present

## 2019-04-11 DIAGNOSIS — Z992 Dependence on renal dialysis: Secondary | ICD-10-CM | POA: Insufficient documentation

## 2019-04-11 DIAGNOSIS — Z8673 Personal history of transient ischemic attack (TIA), and cerebral infarction without residual deficits: Secondary | ICD-10-CM | POA: Diagnosis not present

## 2019-04-11 DIAGNOSIS — H9193 Unspecified hearing loss, bilateral: Secondary | ICD-10-CM

## 2019-04-11 DIAGNOSIS — Z87891 Personal history of nicotine dependence: Secondary | ICD-10-CM | POA: Insufficient documentation

## 2019-04-11 DIAGNOSIS — R509 Fever, unspecified: Secondary | ICD-10-CM | POA: Diagnosis present

## 2019-04-11 DIAGNOSIS — Z8546 Personal history of malignant neoplasm of prostate: Secondary | ICD-10-CM | POA: Insufficient documentation

## 2019-04-11 DIAGNOSIS — Z79899 Other long term (current) drug therapy: Secondary | ICD-10-CM | POA: Diagnosis not present

## 2019-04-11 DIAGNOSIS — Z794 Long term (current) use of insulin: Secondary | ICD-10-CM | POA: Insufficient documentation

## 2019-04-11 DIAGNOSIS — I120 Hypertensive chronic kidney disease with stage 5 chronic kidney disease or end stage renal disease: Secondary | ICD-10-CM | POA: Insufficient documentation

## 2019-04-11 DIAGNOSIS — U071 COVID-19: Secondary | ICD-10-CM | POA: Insufficient documentation

## 2019-04-11 DIAGNOSIS — N186 End stage renal disease: Secondary | ICD-10-CM | POA: Diagnosis not present

## 2019-04-11 DIAGNOSIS — Z7901 Long term (current) use of anticoagulants: Secondary | ICD-10-CM | POA: Insufficient documentation

## 2019-04-11 LAB — COMPREHENSIVE METABOLIC PANEL
ALT: 30 U/L (ref 0–44)
AST: 29 U/L (ref 15–41)
Albumin: 3.9 g/dL (ref 3.5–5.0)
Alkaline Phosphatase: 150 U/L — ABNORMAL HIGH (ref 38–126)
Anion gap: 13 (ref 5–15)
BUN: 20 mg/dL (ref 8–23)
CO2: 29 mmol/L (ref 22–32)
Calcium: 8.4 mg/dL — ABNORMAL LOW (ref 8.9–10.3)
Chloride: 97 mmol/L — ABNORMAL LOW (ref 98–111)
Creatinine, Ser: 3.09 mg/dL — ABNORMAL HIGH (ref 0.61–1.24)
GFR calc Af Amer: 21 mL/min — ABNORMAL LOW (ref >60)
GFR calc non Af Amer: 18 mL/min — ABNORMAL LOW (ref >60)
Glucose, Bld: 178 mg/dL — ABNORMAL HIGH (ref 70–99)
Potassium: 3.6 mmol/L (ref 3.5–5.1)
Sodium: 139 mmol/L (ref 135–145)
Total Bilirubin: 1.2 mg/dL (ref 0.3–1.2)
Total Protein: 7.9 g/dL (ref 6.5–8.1)

## 2019-04-11 LAB — PROTIME-INR
INR: 1 (ref 0.8–1.2)
Prothrombin Time: 12.8 seconds (ref 11.4–15.2)

## 2019-04-11 LAB — CBC WITH DIFFERENTIAL/PLATELET
Abs Immature Granulocytes: 0.02 10*3/uL (ref 0.00–0.07)
Basophils Absolute: 0 10*3/uL (ref 0.0–0.1)
Basophils Relative: 0 %
Eosinophils Absolute: 0 10*3/uL (ref 0.0–0.5)
Eosinophils Relative: 1 %
HCT: 37.9 % — ABNORMAL LOW (ref 39.0–52.0)
Hemoglobin: 12 g/dL — ABNORMAL LOW (ref 13.0–17.0)
Immature Granulocytes: 0 %
Lymphocytes Relative: 13 %
Lymphs Abs: 0.8 10*3/uL (ref 0.7–4.0)
MCH: 32.2 pg (ref 26.0–34.0)
MCHC: 31.7 g/dL (ref 30.0–36.0)
MCV: 101.6 fL — ABNORMAL HIGH (ref 80.0–100.0)
Monocytes Absolute: 0.6 10*3/uL (ref 0.1–1.0)
Monocytes Relative: 11 %
Neutro Abs: 4.4 10*3/uL (ref 1.7–7.7)
Neutrophils Relative %: 75 %
Platelets: 166 10*3/uL (ref 150–400)
RBC: 3.73 MIL/uL — ABNORMAL LOW (ref 4.22–5.81)
RDW: 15.6 % — ABNORMAL HIGH (ref 11.5–15.5)
WBC: 5.9 10*3/uL (ref 4.0–10.5)
nRBC: 0 % (ref 0.0–0.2)

## 2019-04-11 LAB — SARS CORONAVIRUS 2 BY RT PCR (HOSPITAL ORDER, PERFORMED IN ~~LOC~~ HOSPITAL LAB): SARS Coronavirus 2: POSITIVE — AB

## 2019-04-11 LAB — LACTIC ACID, PLASMA: Lactic Acid, Venous: 1.4 mmol/L (ref 0.5–1.9)

## 2019-04-11 MED ORDER — SODIUM CHLORIDE 0.9% FLUSH: 3.0000 mL | Freq: Once | INTRAVENOUS | Status: DC

## 2019-04-11 NOTE — ED Notes (Signed)
MD Archie Balboa made aware of pt's positive Covid test.

## 2019-04-11 NOTE — ED Triage Notes (Signed)
Pt was sent from dialysis for evaluation of fever and possible covid. Pt is very hard of hearing, states he had to leave dialysis due to fever. Cough noted in triage.

## 2019-04-11 NOTE — ED Provider Notes (Signed)
[facility] Emergency Department Provider Note  ____________________________________________  Time seen: Approximately 7:11 PM  I have reviewed the triage vital signs and the nursing notes.   HISTORY  Chief Complaint Fever  Is deaf, uses hearing aids.  However, hearing aid batteries have died and patient is primarily communicated to by writing.  HPI Cory Miller is a 78 y.o. male who presents the emergency department for evaluation of ongoing fever, chills, nasal congestion, sore throat, cough and shortness of breath.  Patient reports that he is a dialysis patient, requiring dialysis Monday, Wednesday, Friday.  Patient reports that 11 days ago after dialysis he returned home and was complaining of some mild shortness of breath, fever, sore throat.  Patient reports that over the past 11 days symptoms have not abated.  He reports that his fever is anywhere from 100 F to 1 1 F.  He does endorse some shortness of breath but denies any chest pain.  Patient has been continuing his dialysis treatments but as he continues to have symptoms, he reports that dialysis him to the emergency department for evaluation.         Past Medical History:  Diagnosis Date  . Blood dyscrasia    prostate  . CAD (coronary artery disease) of artery bypass graft   . Carotid atherosclerosis   . Carotid stenosis   . Chronic kidney disease   . CKD (chronic kidney disease), stage V ([place])   . CVA (cerebrovascular accident) ([place])   . Diabetes mellitus without complication ([place])   . Dyspnea   . ED (erectile dysfunction)   . Elevated troponin I level   . Esophageal ulcer   . GERD (gastroesophageal reflux disease)   . Gout   . Hard of hearing    Pt very hard  of hearing  . Hyperlipidemia   . Hypertension   . MI (myocardial infarction) ([place])   . Neuropathy   . Occlusion of both internal carotid arteries   . Prostate CA ([place])   . Sleep apnea     Patient Active Problem List    Diagnosis Date Noted  . End stage renal disease ([place]) 12/26/2018  . SOB (shortness of breath) 10/17/2018  . Pressure injury of skin 08/23/2018  . Respiratory failure ([place]) 08/22/2018  . Sepsis ([place]) 04/04/2016  . Diabetes ([place]) 04/04/2016  . HTN (hypertension) 04/04/2016  . GERD (gastroesophageal reflux disease) 04/04/2016  . CAD (coronary artery disease) 04/04/2016  . CKD (chronic kidney disease), stage IV ([place]) 04/04/2016  . Abdominal pain 08/02/2015  . Abdominal pain, acute, epigastric   . Pain in the abdomen     Past Surgical History:  Procedure Laterality Date  . ARTERIAL BYPASS SURGRY    . AV FISTULA PLACEMENT Right 06/14/2018   Procedure: ARTERIOVENOUS (AV) FISTULA CREATION(brachiocephalic);  Surgeon: Katha Cabal, MD;  Location: [facility] ORS;  Service: Vascular;  Laterality: Right;  . CAROTID ENDARTERECTOMY Left   . chest tubes x 2     for pneumothorax  . COLONOSCOPY WITH PROPOFOL N/A 06/02/2016   Procedure: COLONOSCOPY WITH PROPOFOL;  Surgeon: Lollie Sails, MD;  Location: [facility] ENDOSCOPY;  Service: Endoscopy;  Laterality: N/A;  . COLONOSCOPY WITH PROPOFOL N/A 07/28/2016   Procedure: COLONOSCOPY WITH PROPOFOL;  Surgeon: Lollie Sails, MD;  Location: [facility] ENDOSCOPY;  Service: Endoscopy;  Laterality: N/A;  . CORONARY ANGIOPLASTY    . CORONARY ARTERY BYPASS GRAFT    . decortication,parietal pleurectomy Left 05/2002  . ESOPHAGOGASTRODUODENOSCOPY N/A 08/05/2015   Procedure:  ESOPHAGOGASTRODUODENOSCOPY (EGD);  Surgeon: Hulen Luster, MD;  Location[facility] ENDOSCOPY;  Service: Endoscopy;  Laterality: N/A;  . ESOPHAGOGASTRODUODENOSCOPY (EGD) WITH PROPOFOL N/A 09/30/2015   Procedure: ESOPHAGOGASTRODUODENOSCOPY (EGD) WITH PROPOFOL;  Surgeon: Hulen Luster, MD;  Location: [facility] ENDOSCOPY;  Service: Gastroenterology;  Laterality: N/A;  . ostate cryoablation    . partial thoracoscopy  6/03    Prior to Admission medications   Medication Sig Start Date End Date Taking? Authorizing Provider   amLODipine (NORVASC) 10 MG tablet Take 10 mg by mouth daily.    [provider]  atorvastatin (LIPITOR) 40 MG tablet Take 40 mg by mouth at bedtime.    [provider]  carvedilol (COREG) 12.5 MG tablet Take 12.5 mg by mouth 2 (two) times daily with a meal.  09/25/17   [provider]  cholecalciferol (VITAMIN D) 1000 units tablet Take 1,000 Units by mouth daily.     [provider]  cloNIDine (CATAPRES) 0.1 MG tablet Take 1 tablet (0.1 mg total) by mouth 3 (three) times daily. 08/27/18   Vaughan Basta, MD  clopidogrel (PLAVIX) 75 MG tablet Take 75 mg by mouth daily.    [provider]  colchicine 0.6 MG tablet Take 0.6 mg by mouth every other day.     [provider]  docusate sodium (COLACE) 100 MG capsule Take 100 mg by mouth 2 (two) times daily.    [provider]  dorzolamide-timolol (COSOPT) 22.3-6.8 MG/ML ophthalmic solution Place 1 drop into both eyes 2 (two) times daily.  02/28/18   [provider]  enalapril (VASOTEC) 2.5 MG tablet Take 1 tablet (2.5 mg total) by mouth daily. 08/28/18   Vaughan Basta, MD  Flaxseed, Linseed, (EQL FLAXSEED OIL) 1200 MG CAPS Take 1,200 mg by mouth daily.    [provider]  hydrALAZINE (APRESOLINE) 100 MG tablet Take 100 mg by mouth 3 (three) times daily.     [provider]  hydrOXYzine (ATARAX/VISTARIL) 25 MG tablet Take 25 mg by mouth 4 (four) times daily as needed for itching.     [provider]  insulin aspart (NOVOLOG) 100 UNIT/ML FlexPen Inject 10 Units into the skin 3 (three) times daily with meals.     [provider]  insulin detemir (LEVEMIR) 100 UNIT/ML injection Inject 0.1 mLs (10 Units total) into the skin daily. Patient taking differently: Inject 10 Units into the skin at bedtime.  08/28/18   Vaughan Basta, MD  isosorbide mononitrate (IMDUR) 120 MG 24 hr tablet Take 120 mg by mouth daily.    [provider]  ketoconazole (NIZORAL) 2 % shampoo Apply 1 application topically 2 (two) times a week.  11/30/17   [provider]  multivitamin (RENA-VIT) TABS tablet Take 1 tablet by mouth at bedtime. 10/23/18   Fritzi Mandes, MD  nitroGLYCERIN (NITROSTAT) 0.4 MG SL tablet DISSOLVE 1 TABLET UNDER TONGUE FOR CHEST PAIN. May repeat every 5 min. for a max of 3 DOSES, If no relief CALL 911 12/05/18   [provider]  oxyCODONE-acetaminophen (PERCOCET/ROXICET) 5-325 MG per tablet Take 1 tablet by mouth 3 (three) times daily.    [provider]  pantoprazole (PROTONIX) 40 MG tablet Take 1 tablet (40 mg total) by mouth daily. 40 mg BID for 1 month followed by once daily Patient taking differently: Take 40 mg by mouth 2 (two) times daily.  08/06/15   Max Sane, MD  simethicone (MYLICON) 80 MG chewable tablet Chew 2 tablets (160 mg total) by  mouth 4 (four) times daily as needed for flatulence. Patient not taking: Reported on 12/24/2018 08/27/18   Vaughan Basta, MD  sodium bicarbonate 650 MG tablet Take 650 mg by mouth 2 (two) times daily.    [provider]  SPS 15 GM/60ML suspension Take 60 mLs by mouth as directed. When eating potassium rich foods 06/05/18   [provider]  torsemide (DEMADEX) 20 MG tablet Take 2 tablets (40 mg total) by mouth daily. 10/23/18   Fritzi Mandes, MD  triamcinolone cream (KENALOG) 0.1 % Apply 1 application topically 2 (two) times daily as needed ([place]).     [provider]    Allergies Patient has no known allergies.  Family History  Problem Relation Age of Onset  . CAD Other   . Diabetes Other   . Cancer Other   . Stroke Other     Social History Social History   Tobacco Use  . Smoking status: Former Smoker    Types: Cigarettes  . Smokeless tobacco: Never Used  Substance Use Topics  . Alcohol use: Not Currently    Alcohol/week: 0.0 standard drinks  . Drug use: No     Review of Systems  Constitutional:  Positive fever/chills Eyes: No visual changes. No discharge ENT: Positive for nasal congestion and sore throat Cardiovascular: no chest pain. Respiratory: Intermittent cough.  Positive SOB. Gastrointestinal: No abdominal pain.  No nausea, no vomiting.  Musculoskeletal: Negative for musculoskeletal pain. Skin: Negative for rash, abrasions, lacerations, ecchymosis. Neurological: Negative for headaches, focal weakness or numbness. 10-point ROS otherwise negative.  ____________________________________________   PHYSICAL EXAM:  VITAL SIGNS: ED Triage Vitals  Enc Vitals Group     BP 04/11/19 1907 (!) 191/71     Pulse Rate 04/11/19 1907 (!) 107     Resp 04/11/19 1907 (!) 24     Temp 04/11/19 1907 100 F (37.8 C)     Temp Source 04/11/19 1907 Oral     SpO2 04/11/19 1907 94 %     Weight --      Height --      Head Circumference --      Peak Flow --      Pain Score 04/11/19 1908 0     Pain Loc --      Pain Edu? --      Excl. in [place]? --      Constitutional: Alert and oriented. Well appearing and in no acute distress.  Patient is deaf, hearing aid batteries have died, patient is communicated to through handwriting. Eyes: Conjunctivae are normal. PERRL. EOMI. Head: Atraumatic. ENT:      Ears: EACs unremarkable bilaterally.  TMs are unremarkable bilaterally.      Nose: Minimal clear congestion/rhinnorhea.      Mouth/Throat: Mucous membranes are moist.  Oropharynx is nonerythematous and nonedematous.  Uvula is midline. Neck: No stridor.  Neck is supple full range of motion Hematological/Lymphatic/Immunilogical: No cervical lymphadenopathy. Cardiovascular: Normal rate, regular rhythm. Normal S1 and S2.  Good peripheral circulation. Respiratory: Normal respiratory effort without tachypnea or retractions. Lungs CTAB. Good air entry to the bases with no decreased or absent breath sounds. Musculoskeletal: Full range of motion to all extremities. No gross deformities  appreciated. Neurologic:  Normal speech and language. No gross focal neurologic deficits are appreciated.  Skin:  Skin is warm, dry and intact. No rash noted. Psychiatric: Mood and affect are normal. Speech and behavior are normal. Patient exhibits appropriate insight and judgement.   ____________________________________________   LABS (all  labs ordered are listed, but only abnormal results are displayed)  Labs Reviewed  COMPREHENSIVE METABOLIC PANEL - Abnormal; Notable for the following components:      Result Value   Chloride 97 (*)    Glucose, Bld 178 (*)    Creatinine, Ser 3.09 (*)    Calcium 8.4 (*)    Alkaline Phosphatase 150 (*)    GFR calc non Af Amer 18 (*)    GFR calc Af Amer 21 (*)    All other components within normal limits  CBC WITH DIFFERENTIAL/PLATELET - Abnormal; Notable for the following components:   RBC 3.73 (*)    Hemoglobin 12.0 (*)    HCT 37.9 (*)    MCV 101.6 (*)    RDW 15.6 (*)    All other components within normal limits  CULTURE, BLOOD (ROUTINE X 2)  CULTURE, BLOOD (ROUTINE X 2)  SARS CORONAVIRUS 2 (HOSPITAL ORDER, [place] [facility])  LACTIC ACID, PLASMA  PROTIME-INR  LACTIC ACID, PLASMA  URINALYSIS, COMPLETE (UACMP) WITH MICROSCOPIC   ____________________________________________  EKG   ____________________________________________  RADIOLOGY I personally viewed and evaluated these images as part of my medical decision making, as well as reviewing the written report by the radiologist.  Dg Chest 1 View  Result Date: 04/11/2019 CLINICAL DATA:  Cough and fever. EXAM: CHEST  1 VIEW COMPARISON:  10/19/2018 FINDINGS: Post median sternotomy. Chronic cardiomegaly without significant change from prior. Vascular congestion with resolved pulmonary edema from prior exam. No focal airspace disease, pleural effusion, or pneumothorax. Calcified diaphragmatic plaques most evident on the right. IMPRESSION: Chronic cardiomegaly with  vascular congestion. No focal airspace opacity. Electronically Signed   By: Keith Rake M.D.   On: 04/11/2019 19:58    ____________________________________________    PROCEDURES  Procedure(s) performed:    Procedures    Medications  sodium chloride flush (NS) 0.9 % injection 3 mL (3 mLs Intravenous Not Given 04/11/19 2006)     ____________________________________________   INITIAL IMPRESSION / ASSESSMENT AND PLAN / ED COURSE  Pertinent labs & imaging results that were available during my care of the patient were reviewed by me and considered in my medical decision making (see chart for details).  Review of the [place] CSRS was performed in accordance of the [place] prior to dispensing any controlled drugs.        The patient was evaluated for the symptoms described in the history of present illness. The patient was evaluated in the context of the global COVID-19 pandemic, which necessitated consideration that the patient might be at risk for infection with the SARS-CoV-2 virus that causes COVID-19. Institutional protocols and algorithms that pertain to the evaluation of patients at risk for COVID-19 are in a state of rapid change based on information released by regulatory bodies including the CDC and federal and state organizations. The most current policies and algorithms were followed during the patient's care in the ED.   Patient presented to the emergency department with a complaint of nasal congestion, sore throat, fever, cough and shortness of breath x11 days.  Patient is a dialysis patient.  As he has been symptomatic for this length of time, dialysis required that he come to the ED to be tested for COVID-19.  Patient was tachypneic, tachycardic, mildly hypoxic, febrile on arrival to the emergency department.  However, patient appeared to be resting comfortably.  He was deaf, requiring use of hearing aids here.  His hearing aid battery had died and I had to  communicate with the  patient primarily through handwriting.  Patient was evaluated with labs and imaging.  Full work-up had not returned at this time.  As such, I will hand the case over to attending provider, Dr. Archie Balboa for final diagnosis and disposition.     ____________________________________________     This chart was dictated using voice recognition software/Dragon. Despite best efforts to proofread, errors can occur which can change the meaning. Any change was purely unintentional.    Darletta Moll, PA-C 04/11/19 2054    Nance Pear, MD 04/11/19 [phone]

## 2019-04-11 NOTE — ED Notes (Signed)
This RN has received multiple phone calls about pt at this time. Pt stated that [name] be informed of status.

## 2019-04-11 NOTE — ED Notes (Signed)
Pt placed on 2L nasal canula for comfort.

## 2019-04-11 NOTE — ED Notes (Signed)
Difficult to obtain all triage questions due to pt's hard of hearing status.

## 2019-04-11 NOTE — ED Notes (Signed)
[place], pa notified of possible sepsis pt.

## 2019-04-11 NOTE — ED Notes (Signed)
MD Goodman at bedside. 

## 2019-04-11 NOTE — ED Notes (Signed)
Sister Estill Dooms [phone] contacted about pt's transfer status. Ms. Iona Beard will be the contact between this RN and the rest of pt's family.

## 2019-04-11 NOTE — ED Provider Notes (Signed)
COVID returned positive for patient. Discussed this finding with the patient. Patient quite tachypneic while talking. Did put patient on oxygen and he did improve. Will plan on admission given increased work of breathing. Discussed plan to transfer with patient.    Nance Pear, MD 04/11/19 [phone]

## 2019-04-12 ENCOUNTER — Inpatient Hospital Stay (HOSPITAL_COMMUNITY)
Admission: AD | Admit: 2019-04-12 | Discharge: 2019-05-12 | DRG: 871 | Disposition: A | Discharge status: Skilled Nursing Facility | Payer: Medicare PPO | Source: Other Acute Inpatient Hospital | Attending: Internal Medicine | Admitting: Internal Medicine

## 2019-04-12 ENCOUNTER — Encounter (HOSPITAL_COMMUNITY): Payer: Self-pay

## 2019-04-12 DIAGNOSIS — I252 Old myocardial infarction: Secondary | ICD-10-CM

## 2019-04-12 DIAGNOSIS — J9601 Acute respiratory failure with hypoxia: Secondary | ICD-10-CM | POA: Diagnosis not present

## 2019-04-12 DIAGNOSIS — D631 Anemia in chronic kidney disease: Secondary | ICD-10-CM | POA: Diagnosis present

## 2019-04-12 DIAGNOSIS — T447X5A Adverse effect of beta-adrenoreceptor antagonists, initial encounter: Secondary | ICD-10-CM | POA: Diagnosis not present

## 2019-04-12 DIAGNOSIS — R0602 Shortness of breath: Secondary | ICD-10-CM

## 2019-04-12 DIAGNOSIS — Z8673 Personal history of transient ischemic attack (TIA), and cerebral infarction without residual deficits: Secondary | ICD-10-CM

## 2019-04-12 DIAGNOSIS — D638 Anemia in other chronic diseases classified elsewhere: Secondary | ICD-10-CM | POA: Diagnosis not present

## 2019-04-12 DIAGNOSIS — E1122 Type 2 diabetes mellitus with diabetic chronic kidney disease: Secondary | ICD-10-CM

## 2019-04-12 DIAGNOSIS — H9190 Unspecified hearing loss, unspecified ear: Secondary | ICD-10-CM | POA: Diagnosis present

## 2019-04-12 DIAGNOSIS — E785 Hyperlipidemia, unspecified: Secondary | ICD-10-CM | POA: Diagnosis not present

## 2019-04-12 DIAGNOSIS — Z7902 Long term (current) use of antithrombotics/antiplatelets: Secondary | ICD-10-CM | POA: Diagnosis not present

## 2019-04-12 DIAGNOSIS — E876 Hypokalemia: Secondary | ICD-10-CM | POA: Diagnosis present

## 2019-04-12 DIAGNOSIS — Z951 Presence of aortocoronary bypass graft: Secondary | ICD-10-CM | POA: Diagnosis not present

## 2019-04-12 DIAGNOSIS — Z7189 Other specified counseling: Secondary | ICD-10-CM | POA: Diagnosis not present

## 2019-04-12 DIAGNOSIS — R509 Fever, unspecified: Secondary | ICD-10-CM

## 2019-04-12 DIAGNOSIS — I441 Atrioventricular block, second degree: Secondary | ICD-10-CM | POA: Diagnosis present

## 2019-04-12 DIAGNOSIS — T465X5A Adverse effect of other antihypertensive drugs, initial encounter: Secondary | ICD-10-CM | POA: Diagnosis not present

## 2019-04-12 DIAGNOSIS — N186 End stage renal disease: Secondary | ICD-10-CM | POA: Diagnosis present

## 2019-04-12 DIAGNOSIS — U071 COVID-19: Secondary | ICD-10-CM | POA: Diagnosis present

## 2019-04-12 DIAGNOSIS — Z87891 Personal history of nicotine dependence: Secondary | ICD-10-CM | POA: Diagnosis not present

## 2019-04-12 DIAGNOSIS — Z794 Long term (current) use of insulin: Secondary | ICD-10-CM

## 2019-04-12 DIAGNOSIS — E1169 Type 2 diabetes mellitus with other specified complication: Secondary | ICD-10-CM | POA: Diagnosis not present

## 2019-04-12 DIAGNOSIS — T380X5A Adverse effect of glucocorticoids and synthetic analogues, initial encounter: Secondary | ICD-10-CM | POA: Diagnosis not present

## 2019-04-12 DIAGNOSIS — J1289 Other viral pneumonia: Secondary | ICD-10-CM | POA: Diagnosis present

## 2019-04-12 DIAGNOSIS — E1165 Type 2 diabetes mellitus with hyperglycemia: Secondary | ICD-10-CM | POA: Diagnosis not present

## 2019-04-12 DIAGNOSIS — Z992 Dependence on renal dialysis: Secondary | ICD-10-CM

## 2019-04-12 DIAGNOSIS — Z8546 Personal history of malignant neoplasm of prostate: Secondary | ICD-10-CM

## 2019-04-12 DIAGNOSIS — I5032 Chronic diastolic (congestive) heart failure: Secondary | ICD-10-CM | POA: Diagnosis present

## 2019-04-12 DIAGNOSIS — I2510 Atherosclerotic heart disease of native coronary artery without angina pectoris: Secondary | ICD-10-CM | POA: Diagnosis present

## 2019-04-12 DIAGNOSIS — R1031 Right lower quadrant pain: Secondary | ICD-10-CM

## 2019-04-12 DIAGNOSIS — I132 Hypertensive heart and chronic kidney disease with heart failure and with stage 5 chronic kidney disease, or end stage renal disease: Secondary | ICD-10-CM | POA: Diagnosis present

## 2019-04-12 DIAGNOSIS — E872 Acidosis: Secondary | ICD-10-CM | POA: Diagnosis not present

## 2019-04-12 DIAGNOSIS — T461X5A Adverse effect of calcium-channel blockers, initial encounter: Secondary | ICD-10-CM | POA: Diagnosis not present

## 2019-04-12 DIAGNOSIS — N390 Urinary tract infection, site not specified: Secondary | ICD-10-CM | POA: Diagnosis not present

## 2019-04-12 DIAGNOSIS — A4189 Other specified sepsis: Secondary | ICD-10-CM | POA: Diagnosis present

## 2019-04-12 DIAGNOSIS — G4730 Sleep apnea, unspecified: Secondary | ICD-10-CM | POA: Diagnosis present

## 2019-04-12 DIAGNOSIS — I159 Secondary hypertension, unspecified: Secondary | ICD-10-CM

## 2019-04-12 DIAGNOSIS — I10 Essential (primary) hypertension: Secondary | ICD-10-CM | POA: Diagnosis present

## 2019-04-12 DIAGNOSIS — I440 Atrioventricular block, first degree: Secondary | ICD-10-CM | POA: Diagnosis present

## 2019-04-12 DIAGNOSIS — R001 Bradycardia, unspecified: Secondary | ICD-10-CM | POA: Diagnosis not present

## 2019-04-12 DIAGNOSIS — K219 Gastro-esophageal reflux disease without esophagitis: Secondary | ICD-10-CM | POA: Diagnosis present

## 2019-04-12 DIAGNOSIS — Z515 Encounter for palliative care: Secondary | ICD-10-CM

## 2019-04-12 LAB — CBC WITH DIFFERENTIAL/PLATELET
Abs Immature Granulocytes: 0.01 10*3/uL (ref 0.00–0.07)
Basophils Absolute: 0 10*3/uL (ref 0.0–0.1)
Basophils Relative: 0 %
Eosinophils Absolute: 0 10*3/uL (ref 0.0–0.5)
Eosinophils Relative: 0 %
HCT: 37.6 % — ABNORMAL LOW (ref 39.0–52.0)
Hemoglobin: 12 g/dL — ABNORMAL LOW (ref 13.0–17.0)
Immature Granulocytes: 0 %
Lymphocytes Relative: 14 %
Lymphs Abs: 0.7 10*3/uL (ref 0.7–4.0)
MCH: 32.2 pg (ref 26.0–34.0)
MCHC: 31.9 g/dL (ref 30.0–36.0)
MCV: 100.8 fL — ABNORMAL HIGH (ref 80.0–100.0)
Monocytes Absolute: 0.4 10*3/uL (ref 0.1–1.0)
Monocytes Relative: 9 %
Neutro Abs: 3.6 10*3/uL (ref 1.7–7.7)
Neutrophils Relative %: 77 %
Platelets: 160 10*3/uL (ref 150–400)
RBC: 3.73 MIL/uL — ABNORMAL LOW (ref 4.22–5.81)
RDW: 15.4 % (ref 11.5–15.5)
WBC: 4.7 10*3/uL (ref 4.0–10.5)
nRBC: 0 % (ref 0.0–0.2)

## 2019-04-12 LAB — URINALYSIS, COMPLETE (UACMP) WITH MICROSCOPIC
Bacteria, UA: NONE SEEN
Bilirubin Urine: NEGATIVE
Glucose, UA: NEGATIVE mg/dL
Hgb urine dipstick: NEGATIVE
Ketones, ur: NEGATIVE mg/dL
Nitrite: NEGATIVE
Protein, ur: 100 mg/dL — AB
Specific Gravity, Urine: 1.016 (ref 1.005–1.030)
WBC, UA: >50 WBC/hpf — ABNORMAL HIGH (ref 0–5)
pH: 7 (ref 5.0–8.0)

## 2019-04-12 LAB — COMPREHENSIVE METABOLIC PANEL
ALT: 27 U/L (ref 0–44)
AST: 24 U/L (ref 15–41)
Albumin: 3.4 g/dL — ABNORMAL LOW (ref 3.5–5.0)
Alkaline Phosphatase: 130 U/L — ABNORMAL HIGH (ref 38–126)
Anion gap: 15 (ref 5–15)
BUN: 27 mg/dL — ABNORMAL HIGH (ref 8–23)
CO2: 29 mmol/L (ref 22–32)
Calcium: 7.9 mg/dL — ABNORMAL LOW (ref 8.9–10.3)
Chloride: 94 mmol/L — ABNORMAL LOW (ref 98–111)
Creatinine, Ser: 4.23 mg/dL — ABNORMAL HIGH (ref 0.61–1.24)
GFR calc Af Amer: 15 mL/min — ABNORMAL LOW (ref >60)
GFR calc non Af Amer: 13 mL/min — ABNORMAL LOW (ref >60)
Glucose, Bld: 255 mg/dL — ABNORMAL HIGH (ref 70–99)
Potassium: 3.4 mmol/L — ABNORMAL LOW (ref 3.5–5.1)
Sodium: 138 mmol/L (ref 135–145)
Total Bilirubin: 1 mg/dL (ref 0.3–1.2)
Total Protein: 7 g/dL (ref 6.5–8.1)

## 2019-04-12 LAB — LACTATE DEHYDROGENASE: LDH: 193 U/L — ABNORMAL HIGH (ref 98–192)

## 2019-04-12 LAB — PROCALCITONIN: Procalcitonin: 0.9 ng/mL

## 2019-04-12 LAB — GLUCOSE, CAPILLARY
Glucose-Capillary: 236 mg/dL — ABNORMAL HIGH (ref 70–99)
Glucose-Capillary: 156 mg/dL — ABNORMAL HIGH (ref 70–99)

## 2019-04-12 LAB — FERRITIN: Ferritin: 931 ng/mL — ABNORMAL HIGH (ref 24–336)

## 2019-04-12 LAB — TRIGLYCERIDES: Triglycerides: 203 mg/dL — ABNORMAL HIGH

## 2019-04-12 LAB — D-DIMER, QUANTITATIVE: D-Dimer, Quant: 0.49 ug/mL-FEU (ref 0.00–0.50)

## 2019-04-12 LAB — TROPONIN I: Troponin I: 0.26 ng/mL

## 2019-04-12 LAB — BRAIN NATRIURETIC PEPTIDE: B Natriuretic Peptide: 1683.1 pg/mL — ABNORMAL HIGH (ref 0.0–100.0)

## 2019-04-12 LAB — FIBRINOGEN: Fibrinogen: 584 mg/dL — ABNORMAL HIGH (ref 210–475)

## 2019-04-12 LAB — SEDIMENTATION RATE: Sed Rate: 55 mm/hr — ABNORMAL HIGH (ref 0–16)

## 2019-04-12 LAB — ABO/RH: ABO/RH(D): A POS

## 2019-04-12 LAB — C-REACTIVE PROTEIN: CRP: 6.7 mg/dL — ABNORMAL HIGH (ref <1.0)

## 2019-04-12 MED ORDER — ISOSORBIDE MONONITRATE ER 60 MG PO TB24: 120.0000 mg | ORAL_TABLET | Freq: Every day | ORAL | Status: DC

## 2019-04-12 MED ORDER — CLOPIDOGREL BISULFATE 75 MG PO TABS
75.0000 mg | ORAL_TABLET | Freq: Every day | ORAL | Status: DC
  Administered 2019-04-13 – 2019-05-12 (×29): 75 mg via ORAL
  Filled 2019-04-12 (×29): qty 1

## 2019-04-12 MED ORDER — IPRATROPIUM-ALBUTEROL 20-100 MCG/ACT IN AERS
1.0000 | INHALATION_SPRAY | Freq: Four times a day (QID) | RESPIRATORY_TRACT | Status: DC
  Administered 2019-04-12 – 2019-04-22 (×35): 1 via RESPIRATORY_TRACT
  Filled 2019-04-12: qty 4

## 2019-04-12 MED ORDER — POTASSIUM CHLORIDE CRYS ER 20 MEQ PO TBCR
20.0000 meq | EXTENDED_RELEASE_TABLET | ORAL | Status: AC
  Filled 2019-04-12: qty 1

## 2019-04-12 MED ORDER — ONDANSETRON HCL 4 MG/2ML IJ SOLN
4.0000 mg | Freq: Four times a day (QID) | INTRAMUSCULAR | Status: DC | PRN
  Administered 2019-04-28: 4 mg via INTRAVENOUS
  Filled 2019-04-12 (×2): qty 2

## 2019-04-12 MED ORDER — SODIUM CHLORIDE 0.9% FLUSH
3.0000 mL | Freq: Two times a day (BID) | INTRAVENOUS | Status: DC
  Administered 2019-04-12 – 2019-05-12 (×59): 3 mL via INTRAVENOUS

## 2019-04-12 MED ORDER — AMLODIPINE BESYLATE 10 MG PO TABS: 10.0000 mg | ORAL_TABLET | Freq: Every day | ORAL | Status: DC

## 2019-04-12 MED ORDER — INSULIN DETEMIR 100 UNIT/ML ~~LOC~~ SOLN
10.0000 [IU] | Freq: Every day | SUBCUTANEOUS | Status: DC
  Administered 2019-04-12 – 2019-04-21 (×10): 10 [IU] via SUBCUTANEOUS
  Filled 2019-04-12 (×11): qty 0.1

## 2019-04-12 MED ORDER — SODIUM BICARBONATE 650 MG PO TABS
650.0000 mg | ORAL_TABLET | Freq: Two times a day (BID) | ORAL | Status: DC
  Administered 2019-04-12: 650 mg via ORAL
  Filled 2019-04-12: qty 1

## 2019-04-12 MED ORDER — DOCUSATE SODIUM 100 MG PO CAPS
100.0000 mg | ORAL_CAPSULE | Freq: Two times a day (BID) | ORAL | Status: DC
  Administered 2019-04-12 – 2019-04-25 (×16): 100 mg via ORAL
  Filled 2019-04-12 (×20): qty 1

## 2019-04-12 MED ORDER — TORSEMIDE 20 MG PO TABS
40.0000 mg | ORAL_TABLET | Freq: Every day | ORAL | Status: DC
  Administered 2019-04-12: 40 mg via ORAL
  Filled 2019-04-12: qty 2

## 2019-04-12 MED ORDER — ZOLPIDEM TARTRATE 5 MG PO TABS
5.0000 mg | ORAL_TABLET | Freq: Every evening | ORAL | Status: DC | PRN
  Administered 2019-04-12 – 2019-05-11 (×14): 5 mg via ORAL
  Filled 2019-04-12 (×14): qty 1

## 2019-04-12 MED ORDER — SODIUM CHLORIDE 0.9 % IV SOLN
1.0000 g | INTRAVENOUS | Status: DC
  Administered 2019-04-13 – 2019-04-14 (×3): 1 g via INTRAVENOUS
  Filled 2019-04-12 (×4): qty 10

## 2019-04-12 MED ORDER — SODIUM BICARBONATE 650 MG PO TABS
650.0000 mg | ORAL_TABLET | Freq: Two times a day (BID) | ORAL | Status: DC
  Administered 2019-04-12 – 2019-04-15 (×6): 650 mg via ORAL
  Filled 2019-04-12 (×7): qty 1

## 2019-04-12 MED ORDER — PANTOPRAZOLE SODIUM 40 MG PO TBEC
40.0000 mg | DELAYED_RELEASE_TABLET | Freq: Two times a day (BID) | ORAL | Status: DC
  Administered 2019-04-12 – 2019-05-12 (×59): 40 mg via ORAL
  Filled 2019-04-12 (×57): qty 1

## 2019-04-12 MED ORDER — PANTOPRAZOLE SODIUM 40 MG PO TBEC
40.0000 mg | DELAYED_RELEASE_TABLET | Freq: Two times a day (BID) | ORAL | Status: DC
  Administered 2019-04-12: 40 mg via ORAL
  Filled 2019-04-12: qty 1

## 2019-04-12 MED ORDER — TORSEMIDE 20 MG PO TABS
40.0000 mg | ORAL_TABLET | Freq: Every day | ORAL | Status: DC
  Administered 2019-04-13 – 2019-04-15 (×3): 40 mg via ORAL
  Filled 2019-04-12 (×3): qty 2

## 2019-04-12 MED ORDER — OXYCODONE-ACETAMINOPHEN 5-325 MG PO TABS
1.0000 | ORAL_TABLET | Freq: Three times a day (TID) | ORAL | Status: DC | PRN
  Administered 2019-04-13 – 2019-04-14 (×3): 1 via ORAL
  Filled 2019-04-12 (×3): qty 1

## 2019-04-12 MED ORDER — CLONIDINE HCL 0.1 MG PO TABS
0.1000 mg | ORAL_TABLET | Freq: Three times a day (TID) | ORAL | Status: DC
  Administered 2019-04-12: 0.1 mg via ORAL
  Filled 2019-04-12: qty 1

## 2019-04-12 MED ORDER — RENA-VITE PO TABS
1.0000 | ORAL_TABLET | Freq: Every day | ORAL | Status: DC
  Administered 2019-04-12: 1 via ORAL
  Filled 2019-04-12: qty 1

## 2019-04-12 MED ORDER — CARVEDILOL 6.25 MG PO TABS
6.2500 mg | ORAL_TABLET | Freq: Two times a day (BID) | ORAL | Status: DC
  Administered 2019-04-12: 6.25 mg via ORAL
  Filled 2019-04-12: qty 1

## 2019-04-12 MED ORDER — ATORVASTATIN CALCIUM 40 MG PO TABS
40.0000 mg | ORAL_TABLET | Freq: Every day | ORAL | Status: DC
  Administered 2019-04-12 – 2019-05-11 (×30): 40 mg via ORAL
  Filled 2019-04-12 (×30): qty 1

## 2019-04-12 MED ORDER — ZINC SULFATE 220 (50 ZN) MG PO CAPS
220.0000 mg | ORAL_CAPSULE | Freq: Every day | ORAL | Status: DC
  Administered 2019-04-12 – 2019-05-12 (×30): 220 mg via ORAL
  Filled 2019-04-12 (×32): qty 1

## 2019-04-12 MED ORDER — ACETAMINOPHEN 500 MG PO TABS
1000.0000 mg | ORAL_TABLET | Freq: Once | ORAL | Status: AC
  Administered 2019-04-12: 1000 mg via ORAL

## 2019-04-12 MED ORDER — AMLODIPINE BESYLATE 10 MG PO TABS
10.0000 mg | ORAL_TABLET | Freq: Every day | ORAL | Status: DC
  Administered 2019-04-13 – 2019-04-16 (×4): 10 mg via ORAL
  Filled 2019-04-12 (×4): qty 1

## 2019-04-12 MED ORDER — HYDROXYZINE HCL 25 MG PO TABS
25.0000 mg | ORAL_TABLET | Freq: Four times a day (QID) | ORAL | Status: DC | PRN
  Administered 2019-04-23 – 2019-05-11 (×12): 25 mg via ORAL
  Filled 2019-04-12 (×12): qty 1

## 2019-04-12 MED ORDER — HYDRALAZINE HCL 100 MG PO TABS: 100.0000 mg | ORAL_TABLET | Freq: Three times a day (TID) | ORAL | Status: DC

## 2019-04-12 MED ORDER — GUAIFENESIN-DM 100-10 MG/5ML PO SYRP
10.0000 mL | ORAL_SOLUTION | ORAL | Status: DC | PRN
  Administered 2019-04-17 – 2019-05-12 (×20): 10 mL via ORAL
  Filled 2019-04-12 (×23): qty 10

## 2019-04-12 MED ORDER — DORZOLAMIDE HCL-TIMOLOL MAL 2-0.5 % OP SOLN
1.0000 [drp] | Freq: Two times a day (BID) | OPHTHALMIC | Status: DC
  Administered 2019-04-12 – 2019-05-12 (×58): 1 [drp] via OPHTHALMIC
  Filled 2019-04-12: qty 10

## 2019-04-12 MED ORDER — ALLOPURINOL 100 MG PO TABS
100.0000 mg | ORAL_TABLET | Freq: Every day | ORAL | Status: DC
  Administered 2019-04-12: 100 mg via ORAL
  Filled 2019-04-12: qty 1

## 2019-04-12 MED ORDER — CLOPIDOGREL BISULFATE 75 MG PO TABS
75.0000 mg | ORAL_TABLET | Freq: Every day | ORAL | Status: DC
  Administered 2019-04-12: 75 mg via ORAL
  Filled 2019-04-12: qty 1

## 2019-04-12 MED ORDER — ACETAMINOPHEN 500 MG PO TABS
ORAL_TABLET | ORAL | Status: AC
  Filled 2019-04-12: qty 2

## 2019-04-12 MED ORDER — VITAMIN C 500 MG PO TABS
500.0000 mg | ORAL_TABLET | Freq: Every day | ORAL | Status: DC
  Administered 2019-04-12 – 2019-05-12 (×30): 500 mg via ORAL
  Filled 2019-04-12 (×29): qty 1

## 2019-04-12 MED ORDER — INSULIN ASPART 100 UNIT/ML ~~LOC~~ SOLN
0.0000 [IU] | Freq: Three times a day (TID) | SUBCUTANEOUS | Status: DC
  Administered 2019-04-12: 5 [IU] via SUBCUTANEOUS
  Administered 2019-04-13: 2 [IU] via SUBCUTANEOUS
  Administered 2019-04-13: 5 [IU] via SUBCUTANEOUS
  Administered 2019-04-13: 2 [IU] via SUBCUTANEOUS
  Administered 2019-04-13 – 2019-04-14 (×2): 3 [IU] via SUBCUTANEOUS
  Administered 2019-04-14: 5 [IU] via SUBCUTANEOUS
  Administered 2019-04-15 – 2019-04-16 (×5): 3 [IU] via SUBCUTANEOUS
  Administered 2019-04-16: 5 [IU] via SUBCUTANEOUS
  Administered 2019-04-17 (×3): 3 [IU] via SUBCUTANEOUS
  Administered 2019-04-18: 5 [IU] via SUBCUTANEOUS
  Administered 2019-04-18: 3 [IU] via SUBCUTANEOUS
  Administered 2019-04-19: 5 [IU] via SUBCUTANEOUS
  Administered 2019-04-19: 3 [IU] via SUBCUTANEOUS
  Administered 2019-04-19: 5 [IU] via SUBCUTANEOUS
  Administered 2019-04-20 – 2019-04-21 (×4): 3 [IU] via SUBCUTANEOUS
  Administered 2019-04-21: 15 [IU] via SUBCUTANEOUS
  Administered 2019-04-21: 5 [IU] via SUBCUTANEOUS
  Administered 2019-04-22: 7 [IU] via SUBCUTANEOUS
  Administered 2019-04-22: 5 [IU] via SUBCUTANEOUS
  Administered 2019-04-22 – 2019-04-23 (×2): 15 [IU] via SUBCUTANEOUS
  Administered 2019-04-23: 5 [IU] via SUBCUTANEOUS
  Administered 2019-04-24: 2 [IU] via SUBCUTANEOUS
  Administered 2019-04-24: 5 [IU] via SUBCUTANEOUS
  Administered 2019-04-24 – 2019-04-25 (×2): 3 [IU] via SUBCUTANEOUS
  Administered 2019-04-25: 2 [IU] via SUBCUTANEOUS
  Administered 2019-04-25 – 2019-04-26 (×2): 3 [IU] via SUBCUTANEOUS
  Administered 2019-04-26: 5 [IU] via SUBCUTANEOUS
  Administered 2019-04-26: 3 [IU] via SUBCUTANEOUS
  Administered 2019-04-27: 2 [IU] via SUBCUTANEOUS
  Administered 2019-04-27 (×2): 5 [IU] via SUBCUTANEOUS
  Administered 2019-04-28 – 2019-04-29 (×3): 3 [IU] via SUBCUTANEOUS
  Administered 2019-04-29: 2 [IU] via SUBCUTANEOUS
  Administered 2019-04-30: 3 [IU] via SUBCUTANEOUS
  Administered 2019-05-01: 5 [IU] via SUBCUTANEOUS
  Administered 2019-05-01: 2 [IU] via SUBCUTANEOUS
  Administered 2019-05-02 (×2): 5 [IU] via SUBCUTANEOUS
  Administered 2019-05-03: 8 [IU] via SUBCUTANEOUS
  Administered 2019-05-03: 2 [IU] via SUBCUTANEOUS
  Administered 2019-05-03: 3 [IU] via SUBCUTANEOUS
  Administered 2019-05-04: 2 [IU] via SUBCUTANEOUS
  Administered 2019-05-04 (×2): 3 [IU] via SUBCUTANEOUS
  Administered 2019-05-05: 2 [IU] via SUBCUTANEOUS
  Administered 2019-05-05 – 2019-05-06 (×2): 3 [IU] via SUBCUTANEOUS
  Administered 2019-05-06: 2 [IU] via SUBCUTANEOUS
  Administered 2019-05-06: 3 [IU] via SUBCUTANEOUS
  Administered 2019-05-07: 2 [IU] via SUBCUTANEOUS
  Administered 2019-05-07 (×2): 3 [IU] via SUBCUTANEOUS
  Administered 2019-05-08: 2 [IU] via SUBCUTANEOUS
  Administered 2019-05-08: 3 [IU] via SUBCUTANEOUS
  Administered 2019-05-08: 5 [IU] via SUBCUTANEOUS
  Administered 2019-05-09: 2 [IU] via SUBCUTANEOUS
  Administered 2019-05-09 – 2019-05-10 (×3): 3 [IU] via SUBCUTANEOUS
  Administered 2019-05-10: 2 [IU] via SUBCUTANEOUS
  Administered 2019-05-10: 5 [IU] via SUBCUTANEOUS
  Administered 2019-05-11: 2 [IU] via SUBCUTANEOUS
  Administered 2019-05-11: 5 [IU] via SUBCUTANEOUS
  Administered 2019-05-11 – 2019-05-12 (×3): 3 [IU] via SUBCUTANEOUS

## 2019-04-12 MED ORDER — ONDANSETRON HCL 4 MG PO TABS: 4.0000 mg | ORAL_TABLET | Freq: Four times a day (QID) | ORAL | Status: DC | PRN

## 2019-04-12 MED ORDER — CLONIDINE HCL 0.1 MG PO TABS
0.1000 mg | ORAL_TABLET | Freq: Three times a day (TID) | ORAL | Status: DC
  Administered 2019-04-13 – 2019-04-18 (×15): 0.1 mg via ORAL
  Filled 2019-04-12 (×16): qty 1

## 2019-04-12 MED ORDER — ENALAPRIL MALEATE 2.5 MG PO TABS
2.5000 mg | ORAL_TABLET | Freq: Every day | ORAL | Status: DC
  Administered 2019-04-12: 2.5 mg via ORAL
  Filled 2019-04-12: qty 1

## 2019-04-12 MED ORDER — HYDROCOD POLST-CPM POLST ER 10-8 MG/5ML PO SUER
5.0000 mL | Freq: Two times a day (BID) | ORAL | Status: DC | PRN
  Administered 2019-04-15: 5 mL via ORAL
  Filled 2019-04-12 (×2): qty 5

## 2019-04-12 MED ORDER — CARVEDILOL 12.5 MG PO TABS: 12.5000 mg | ORAL_TABLET | Freq: Two times a day (BID) | ORAL | Status: DC

## 2019-04-12 MED ORDER — CARVEDILOL 6.25 MG PO TABS
6.2500 mg | ORAL_TABLET | Freq: Two times a day (BID) | ORAL | Status: DC
  Administered 2019-04-13 – 2019-05-03 (×34): 6.25 mg via ORAL
  Filled 2019-04-12 (×39): qty 1

## 2019-04-12 MED ORDER — HEPARIN SODIUM (PORCINE) 5000 UNIT/ML IJ SOLN
5000.0000 [IU] | Freq: Three times a day (TID) | INTRAMUSCULAR | Status: DC
  Administered 2019-04-12 – 2019-04-20 (×23): 5000 [IU] via SUBCUTANEOUS
  Filled 2019-04-12 (×23): qty 1

## 2019-04-12 NOTE — ED Notes (Signed)
emtala reviewed by this RN 

## 2019-04-12 NOTE — H&P (Signed)
History and Physical    Cory Miller GQQ:[id number] DOB: [birth year] DOA: 04/12/2019  Referring MD/NP/PA: Ivor Costa, MD PCP: [name], MD  Patient coming from: [facility] regional hospital transfer  Chief Complaint: Fever  I have personally briefly reviewed patient's old medical records in [place]   HPI: Cory Miller is a 78 y.o. 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  Reports having fevers up to 101 F at home.  Patient is not normally on oxygen, but found himself getting easily winded even with minimal exertion.  Associated symptoms included sore throat, nasal congestion, diarrhea earlier in the week, and intermittent chest pain.  He had continued to go to hemodialysis sessions and had a near complete dialysis session on 5/15.  However, was sent to the emergency department for further evaluation due to symptoms.  On admission patient was noted to blood pressures up to 191/71, pulse 107, respirations 24, O2 saturations 94% on 2 L nasal cannula oxygen.  Lab work revealed WBC 5.9, potassium 3.6, CO2 29, BUN 20, creatinine 3.09, and.  Lactic acid 1.4.  Chest x-ray showed chronic cardiomegaly with basilar congestion without any focal airspace opacity noted.  COVID-19 testing was positive.  Transfer was requested for need of hemodialysis and COVID-19 testing.  Patient was accepted to a progressive bed here at [facility] for need of hemodialysis.  ED Course: As seen above  Review of Systems  Constitutional: Positive for chills, fever and malaise/fatigue.  HENT: Positive for congestion, hearing loss and sore throat.   Eyes: Negative for photophobia and pain.  Respiratory: Positive for cough and shortness of breath.   Cardiovascular: Positive for chest pain.  Negative for leg swelling.  Gastrointestinal: Positive for diarrhea.  Genitourinary: Negative for dysuria.  Musculoskeletal: Negative for falls.  Skin: Negative for itching and rash.  Neurological: Positive for weakness. Negative for loss of consciousness.  Endo/Heme/Allergies: Negative for polydipsia.  Psychiatric/Behavioral: Negative for memory loss and substance abuse.    Past Medical History:  Diagnosis Date  . Blood dyscrasia    prostate  . CAD (coronary artery disease) of artery bypass graft   . Carotid atherosclerosis   . Carotid stenosis   . Chronic kidney disease   . CKD (chronic kidney disease), stage V ([place])   . CVA (cerebrovascular accident) ([place])   . Diabetes mellitus without complication ([place])   . Dyspnea   . ED (erectile dysfunction)   . Elevated troponin I level   . Esophageal ulcer   . GERD (gastroesophageal reflux disease)   . Gout   . Hard of hearing    Pt very hard  of hearing  . Hyperlipidemia   . Hypertension   . MI (myocardial infarction) ([place])   . Neuropathy   . Occlusion of both internal carotid arteries   . Prostate CA ([place])   . Sleep apnea     Past Surgical History:  Procedure Laterality Date  . ARTERIAL BYPASS SURGRY    . AV FISTULA PLACEMENT Right 06/14/2018   Procedure: ARTERIOVENOUS (AV) FISTULA CREATION(brachiocephalic);  Surgeon: Katha Cabal, MD;  Location: [facility] ORS;  Service: Vascular;  Laterality: Right;  . CAROTID ENDARTERECTOMY Left   . chest tubes x 2     for pneumothorax  . COLONOSCOPY WITH PROPOFOL N/A 06/02/2016  Procedure: COLONOSCOPY WITH PROPOFOL;  Surgeon: Lollie Sails, MD;  Location: [facility] ENDOSCOPY;  Service: Endoscopy;  Laterality: N/A;  . COLONOSCOPY WITH PROPOFOL N/A 07/28/2016   Procedure: COLONOSCOPY WITH PROPOFOL;  Surgeon: Lollie Sails, MD;  Location: [facility] ENDOSCOPY;  Service: Endoscopy;  Laterality: N/A;  . CORONARY ANGIOPLASTY    . CORONARY ARTERY BYPASS GRAFT    . decortication,parietal  pleurectomy Left 05/2002  . ESOPHAGOGASTRODUODENOSCOPY N/A 08/05/2015   Procedure: ESOPHAGOGASTRODUODENOSCOPY (EGD);  Surgeon: Hulen Luster, MD;  Location: [facility] ENDOSCOPY;  Service: Endoscopy;  Laterality: N/A;  . ESOPHAGOGASTRODUODENOSCOPY (EGD) WITH PROPOFOL N/A 09/30/2015   Procedure: ESOPHAGOGASTRODUODENOSCOPY (EGD) WITH PROPOFOL;  Surgeon: Hulen Luster, MD;  Location: [facility] ENDOSCOPY;  Service: Gastroenterology;  Laterality: N/A;  . ostate cryoablation    . partial thoracoscopy  6/03     reports that he has quit smoking. His smoking use included cigarettes. He has never used smokeless tobacco. He reports previous alcohol use. He reports that he does not use drugs.  No Known Allergies  Family History  Problem Relation Age of Onset  . CAD Other   . Diabetes Other   . Cancer Other   . Stroke Other     Prior to Admission medications   Medication Sig Start Date End Date Taking? Authorizing Provider  amLODipine (NORVASC) 10 MG tablet Take 10 mg by mouth daily.    [provider]  atorvastatin (LIPITOR) 40 MG tablet Take 40 mg by mouth at bedtime.    [provider]  carvedilol (COREG) 12.5 MG tablet Take 12.5 mg by mouth 2 (two) times daily with a meal.  09/25/17   [provider]  cholecalciferol (VITAMIN D) 1000 units tablet Take 1,000 Units by mouth daily.     [provider]  cloNIDine (CATAPRES) 0.1 MG tablet Take 1 tablet (0.1 mg total) by mouth 3 (three) times daily. 08/27/18   Vaughan Basta, MD  clopidogrel (PLAVIX) 75 MG tablet Take 75 mg by mouth daily.    [provider]  colchicine 0.6 MG tablet Take 0.6 mg by mouth every other day.     [provider]  docusate sodium (COLACE) 100 MG capsule Take 100 mg by mouth 2 (two) times daily.    [provider]  dorzolamide-timolol (COSOPT) 22.3-6.8 MG/ML ophthalmic solution Place 1 drop into both eyes 2 (two) times daily.  02/28/18   [provider]  enalapril  (VASOTEC) 2.5 MG tablet Take 1 tablet (2.5 mg total) by mouth daily. 08/28/18   Vaughan Basta, MD  Flaxseed, Linseed, (EQL FLAXSEED OIL) 1200 MG CAPS Take 1,200 mg by mouth daily.    [provider]  hydrALAZINE (APRESOLINE) 100 MG tablet Take 100 mg by mouth 3 (three) times daily.     [provider]  hydrOXYzine (ATARAX/VISTARIL) 25 MG tablet Take 25 mg by mouth 4 (four) times daily as needed for itching.     [provider]  insulin aspart (NOVOLOG) 100 UNIT/ML FlexPen Inject 10 Units into the skin 3 (three) times daily with meals.     [provider]  insulin detemir (LEVEMIR) 100 UNIT/ML injection Inject 0.1 mLs (10 Units total) into the skin daily. Patient taking differently: Inject 10 Units into the skin at bedtime.  08/28/18   Vaughan Basta, MD  isosorbide mononitrate (IMDUR) 120 MG 24 hr tablet Take 120 mg by mouth daily.    [provider]  ketoconazole (NIZORAL) 2 % shampoo Apply 1 application topically 2 (two) times  a week.  11/30/17   [provider]  multivitamin (RENA-VIT) TABS tablet Take 1 tablet by mouth at bedtime. 10/23/18   Fritzi Mandes, MD  nitroGLYCERIN (NITROSTAT) 0.4 MG SL tablet DISSOLVE 1 TABLET UNDER TONGUE FOR CHEST PAIN. May repeat every 5 min. for a max of 3 DOSES, If no relief CALL 911 12/05/18   [provider]  oxyCODONE-acetaminophen (PERCOCET/ROXICET) 5-325 MG per tablet Take 1 tablet by mouth 3 (three) times daily.    [provider]  pantoprazole (PROTONIX) 40 MG tablet Take 1 tablet (40 mg total) by mouth daily. 40 mg BID for 1 month followed by once daily Patient taking differently: Take 40 mg by mouth 2 (two) times daily.  08/06/15   Max Sane, MD  simethicone (MYLICON) 80 MG chewable tablet Chew 2 tablets (160 mg total) by mouth 4 (four) times daily as needed for flatulence. Patient not taking: Reported on 12/24/2018 08/27/18   Vaughan Basta, MD  sodium bicarbonate 650  MG tablet Take 650 mg by mouth 2 (two) times daily.    [provider]  SPS 15 GM/60ML suspension Take 60 mLs by mouth as directed. When eating potassium rich foods 06/05/18   [provider]  torsemide (DEMADEX) 20 MG tablet Take 2 tablets (40 mg total) by mouth daily. 10/23/18   Fritzi Mandes, MD  triamcinolone cream (KENALOG) 0.1 % Apply 1 application topically 2 (two) times daily as needed ([place]).     [provider]    Physical Exam:  Constitutional: Elderly male who appears to be in no acute distress at this time. Vitals:   04/12/19 1000  BP: (!) 124/55  Pulse: 88  Resp: 16  Temp: 98.9 F (37.2 C)  TempSrc: Oral  SpO2: 100%  Weight: 117.9 kg  Height: 6\' 1"  (1.854 m)   Eyes: PERRL, lids and conjunctivae normal ENMT: Mucous membranes are moist. Posterior pharynx clear of any exudate or lesions. Hard of hearing. Neck: normal, supple, no masses, no thyromegaly Respiratory: Mild basal crackles noted.  No significant wheezing or rhonchi appreciated.  Patient currently maintaining oxygenation on room air and able to talk in complete sentences. Cardiovascular: Regular rate and rhythm, no murmurs / rubs / gallops. No extremity edema. 2+ pedal pulses. No carotid bruits.  Right upper extremity fistula. Abdomen: no tenderness, no masses palpated. No hepatosplenomegaly. Bowel sounds positive.  Musculoskeletal: no clubbing / cyanosis. No joint deformity upper and lower extremities. Good ROM, no contractures. Normal muscle tone.  Skin: no rashes, lesions, ulcers. No induration Neurologic: CN 2-12 grossly intact. Sensation intact, DTR normal. Strength 5/5 in all 4.  Psychiatric: Normal judgment and insight. Alert and oriented x 3. Normal mood.     Labs on Admission: I have personally reviewed following labs and imaging studies  CBC: Recent Labs  Lab 04/11/19 1936  WBC 5.9  NEUTROABS 4.4  HGB 12.0*  HCT 37.9*  MCV 101.6*  PLT 474   Basic Metabolic Panel:  Recent Labs  Lab 04/11/19 1936  NA 139  K 3.6  CL 97*  CO2 29  GLUCOSE 178*  BUN 20  CREATININE 3.09*  CALCIUM 8.4*   GFR: Estimated Creatinine Clearance: 26.9 mL/min (A) (by C-G formula based on SCr of 3.09 mg/dL (H)). Liver Function Tests: Recent Labs  Lab 04/11/19 1936  AST 29  ALT 30  ALKPHOS 150*  BILITOT 1.2  PROT 7.9  ALBUMIN 3.9   No results for input(s): LIPASE, AMYLASE in the last 168 hours. No results  for input(s): AMMONIA in the last 168 hours. Coagulation Profile: Recent Labs  Lab 04/11/19 1936  INR 1.0   Cardiac Enzymes: No results for input(s): CKTOTAL, CKMB, CKMBINDEX, TROPONINI in the last 168 hours. BNP (last 3 results) No results for input(s): PROBNP in the last 8760 hours. HbA1C: No results for input(s): HGBA1C in the last 72 hours. CBG: No results for input(s): GLUCAP in the last 168 hours. Lipid Profile: No results for input(s): CHOL, HDL, LDLCALC, TRIG, CHOLHDL, LDLDIRECT in the last 72 hours. Thyroid Function Tests: No results for input(s): TSH, T4TOTAL, FREET4, T3FREE, THYROIDAB in the last 72 hours. Anemia Panel: No results for input(s): VITAMINB12, FOLATE, FERRITIN, TIBC, IRON, RETICCTPCT in the last 72 hours. Urine analysis:    Component Value Date/Time   COLORURINE YELLOW (A) 04/11/2019 1956   APPEARANCEUR CLOUDY (A) 04/11/2019 1956   LABSPEC 1.016 04/11/2019 1956   PHURINE 7.0 04/11/2019 1956   GLUCOSEU NEGATIVE 04/11/2019 1956   HGBUR NEGATIVE 04/11/2019 [place] NEGATIVE 04/11/2019 [place] NEGATIVE 04/11/2019 1956   PROTEINUR 100 (A) 04/11/2019 1956   NITRITE NEGATIVE 04/11/2019 1956   LEUKOCYTESUR LARGE (A) 04/11/2019 1956   Sepsis Labs: Recent Results (from the past 240 hour(s))  Culture, blood (Routine x 2)     Status: None (Preliminary result)   Collection Time: 04/11/19  7:36 PM  Result Value Ref Range Status   Specimen Description BLOOD LEFT ANTECUBITAL  Final   Special Requests   Final     BOTTLES DRAWN AEROBIC AND ANAEROBIC Blood Culture results may not be optimal due to an excessive volume of blood received in culture bottles   Culture   Final    NO GROWTH < 12 HOURS Performed at [facility], [address] [place], [place] [address]    Report Status PENDING  Incomplete  SARS Coronavirus 2 (CEPHEID- Performed in [place] hospital lab), Hosp Order     Status: Abnormal   Collection Time: 04/11/19  7:36 PM  Result Value Ref Range Status   SARS Coronavirus 2 POSITIVE (A) NEGATIVE Final    Comment: RESULT CALLED TO, READ BACK BY AND VERIFIED WITH: CALLED TO [place] @2121  04/11/2019 [facility] (NOTE) If result is NEGATIVE SARS-CoV-2 target nucleic acids are NOT DETECTED. The SARS-CoV-2 RNA is generally detectable in upper and lower  respiratory specimens during the acute phase of infection. The lowest  concentration of SARS-CoV-2 viral copies this assay can detect is 250  copies / mL. A negative result does not preclude SARS-CoV-2 infection  and should not be used as the sole basis for treatment or other  patient management decisions.  A negative result may occur with  improper specimen collection / handling, submission of specimen other  than nasopharyngeal swab, presence of viral mutation(s) within the  areas targeted by this assay, and inadequate number of viral copies  (<250 copies / mL). A negative result must be combined with clinical  observations, patient history, and epidemiological information. If result is POSITIVE SARS-CoV-2 target nucleic acids are DETEC TED. The SARS-CoV-2 RNA is generally detectable in upper and lower  respiratory specimens during the acute phase of infection.  Positive  results are indicative of active infection with SARS-CoV-2.  Clinical  correlation with patient history and other diagnostic information is  necessary to determine patient infection status.  Positive results do  not rule out bacterial infection or co-infection  with other viruses. If result is PRESUMPTIVE POSTIVE SARS-CoV-2 nucleic acids MAY BE PRESENT.  A presumptive positive result was obtained on the submitted specimen  and confirmed on repeat testing.  While 2019 novel coronavirus  (SARS-CoV-2) nucleic acids may be present in the submitted sample  additional confirmatory testing may be necessary for epidemiological  and / or clinical management purposes  to differentiate between  SARS-CoV-2 and other Sarbecovirus currently known to infect humans.  If clinically indicated additional testing with an alternate test  methodology (LAB7 [id number]) is advised. The SARS-CoV-2 RNA is generally  detectable in upper and lower respiratory specimens during the acute  phase of infection. The expected result is Negative. Fact Sheet for Patients:  StrictlyIdeas.no Fact Sheet for Healthcare Providers: BankingDealers.co.za This test is not yet approved or cleared by the [place] FDA and has been authorized for detection and/or diagnosis of SARS-CoV-2 by FDA under an Emergency Use Authorization (EUA).  This EUA will remain in effect (meaning this test can be used) for the duration of the COVID-19 declaration under Section 564(b)(1) of the Act, 21 U.S.C. section 360bbb-3(b)(1), unless the authorization is terminated or revoked sooner. Performed at [facility], [address] [place], [place] [address]   Culture, blood (Routine x 2)     Status: None (Preliminary result)   Collection Time: 04/11/19  7:38 PM  Result Value Ref Range Status   Specimen Description BLOOD LEFT HAND  Final   Special Requests   Final    BOTTLES DRAWN AEROBIC AND ANAEROBIC Blood Culture adequate volume   Culture   Final    NO GROWTH < 12 HOURS Performed at [facility], [address], [place], [place] [address]    Report Status PENDING  Incomplete     Radiological Exams on Admission: Dg Chest 1 View   Result Date: 04/11/2019 CLINICAL DATA:  Cough and fever. EXAM: CHEST  1 VIEW COMPARISON:  10/19/2018 FINDINGS: Post median sternotomy. Chronic cardiomegaly without significant change from prior. Vascular congestion with resolved pulmonary edema from prior exam. No focal airspace disease, pleural effusion, or pneumothorax. Calcified diaphragmatic plaques most evident on the right. IMPRESSION: Chronic cardiomegaly with vascular congestion. No focal airspace opacity. Electronically Signed   By: Keith Rake M.D.   On: 04/11/2019 19:58    EKG: Independently reviewed.  Sinus rhythm at 72 bpm with second-degree heart block.  Assessment/Plan COVID 19 positive: Patient presents with complaints of fever, chills, shortness of breath, and generalized malaise.  Chest x-ray noted cardiomegaly without any signs of any focal infiltrate or edema.  Found to be COVID-19 positive, but with limited oxygen requirements at this time. -Admit to a progressive bed -COVID-19 admission order set utilized -Continuous pulse oximetry with nasal cannula oxygen as needed -Combivent inhaler -Follow-up a.m. labs  ESRD on HD: Patient last had a nearly complete dialysis session on 5/16. -Renal diet and carb modified with fluid restriction -Dr. Johnney Ou of Nephrology consulted  -HD per nephrology  Possible UTI/asymptomatic bacteriuria: UA urine positive for large leukocytes with >50 WBCs. -Check urine culture -Rocephin IV  Second-degree heart block: New finding on EKG when compared to previous tracings. -Continue to monitor -Consult cardiology if needed  Essential hypertension: Blood pressure seen to be elevated up to 191/71. -Continue Norvasc, Coreg, clonidine, and hydralazine. -Restart enalapril if patient still taking in a.m.  Diabetes mellitus type 2: Last hemoglobin A1c noted to be 6.9 in 09/2018. -Hypoglycemic protocol -Continue home Levemir 10 units at bedtime -CBGs with moderate sliding scale insulin   Anemia of chronic disease: Hemoglobin appears stable at 12 -Continue  to monitor  Hypokalemia: Acute.  Recheck potassium noted to be 3.4 after blood work obtained. -Give 20 mEq of potassium DVT prophylaxis: Heparin Code Status: Full Family Communication: No family present at bedside Disposition Plan: To be determined Consults called: Nephrology Admission status: Inpatient  Norval Morton MD [facility] Pager [phone]   If 7PM-7AM, please contact night-coverage [email or web address] Password [email or web address]  04/12/2019, 11:46 AM

## 2019-04-12 NOTE — Consult Note (Signed)
[facility]  INPATIENT CONSULTATION  Reason for Consultation: ESRD Requesting Provider: Dr. Tamala Julian  HPI: Cory Miller is an 78 y.o. male with ESRD on HD MWF ([facility]), CAD s/p CABG, h/o CVA, GERD, HTN, DM who is currently hospitalized with COVID.  Nephrology is consulted for ESRD management.   History obtain from chart - pt was sent from HD yesterday for fever, cough, concern for COVID.  At [facility] he tested + COVID and was transferred to [facility] for ongoing care.  Per records in care everywhere completed enough treatment for 2.75L UF.  Outpt orders are not available at this time and I'm unsure of his EDW.   He is currently afebrile, normotensive, sat 100% on RA to 2L.  K 3.6, BUN 20, Hb 12, lactate 1.4. CXR with pulm vasc congestion, cardiomegaly, no infiltrate.   PMH: Past Medical History:  Diagnosis Date  . Blood dyscrasia    prostate  . CAD (coronary artery disease) of artery bypass graft   . Carotid atherosclerosis   . Carotid stenosis   . Chronic kidney disease   . CKD (chronic kidney disease), stage V ([place])   . CVA (cerebrovascular accident) ([place])   . Diabetes mellitus without complication ([place])   . Dyspnea   . ED (erectile dysfunction)   . Elevated troponin I level   . Esophageal ulcer   . GERD (gastroesophageal reflux disease)   . Gout   . Hard of hearing    Pt very hard  of hearing  . Hyperlipidemia   . Hypertension   . MI (myocardial infarction) ([place])   . Neuropathy   . Occlusion of both internal carotid arteries   . Prostate CA ([place])   . Sleep apnea    PSH: Past Surgical History:  Procedure Laterality Date  . ARTERIAL BYPASS SURGRY    . AV FISTULA PLACEMENT Right 06/14/2018   Procedure: ARTERIOVENOUS (AV) FISTULA CREATION(brachiocephalic);  Surgeon: Katha Cabal, MD;  Location: [facility] ORS;  Service: Vascular;  Laterality: Right;  . CAROTID ENDARTERECTOMY Left   . chest tubes x 2     for pneumothorax  . COLONOSCOPY WITH PROPOFOL N/A  06/02/2016   Procedure: COLONOSCOPY WITH PROPOFOL;  Surgeon: Lollie Sails, MD;  Location: [facility] ENDOSCOPY;  Service: Endoscopy;  Laterality: N/A;  . COLONOSCOPY WITH PROPOFOL N/A 07/28/2016   Procedure: COLONOSCOPY WITH PROPOFOL;  Surgeon: Lollie Sails, MD;  Location: [facility] ENDOSCOPY;  Service: Endoscopy;  Laterality: N/A;  . CORONARY ANGIOPLASTY    . CORONARY ARTERY BYPASS GRAFT    . decortication,parietal pleurectomy Left 05/2002  . ESOPHAGOGASTRODUODENOSCOPY N/A 08/05/2015   Procedure: ESOPHAGOGASTRODUODENOSCOPY (EGD);  Surgeon: Hulen Luster, MD;  Location: [facility] ENDOSCOPY;  Service: Endoscopy;  Laterality: N/A;  . ESOPHAGOGASTRODUODENOSCOPY (EGD) WITH PROPOFOL N/A 09/30/2015   Procedure: ESOPHAGOGASTRODUODENOSCOPY (EGD) WITH PROPOFOL;  Surgeon: Hulen Luster, MD;  Location: [facility] ENDOSCOPY;  Service: Gastroenterology;  Laterality: N/A;  . ostate cryoablation    . partial thoracoscopy  6/03    Past Medical History:  Diagnosis Date  . Blood dyscrasia    prostate  . CAD (coronary artery disease) of artery bypass graft   . Carotid atherosclerosis   . Carotid stenosis   . Chronic kidney disease   . CKD (chronic kidney disease), stage V ([place])   . CVA (cerebrovascular accident) ([place])   . Diabetes mellitus without complication ([place])   . Dyspnea   . ED (erectile dysfunction)   . Elevated troponin I level   . Esophageal  ulcer   . GERD (gastroesophageal reflux disease)   . Gout   . Hard of hearing    Pt very hard  of hearing  . Hyperlipidemia   . Hypertension   . MI (myocardial infarction) ([place])   . Neuropathy   . Occlusion of both internal carotid arteries   . Prostate CA ([place])   . Sleep apnea     Medications:  I have reviewed the patient's current medications.  Medications Prior to Admission  Medication Sig Dispense Refill  . amLODipine (NORVASC) 10 MG tablet Take 10 mg by mouth daily.    [name] atorvastatin (LIPITOR) 40 MG tablet Take 40 mg by mouth at bedtime.    . carvedilol (COREG)  12.5 MG tablet Take 12.5 mg by mouth 2 (two) times daily with a meal.     . cholecalciferol (VITAMIN D) 1000 units tablet Take 1,000 Units by mouth daily.     . cloNIDine (CATAPRES) 0.1 MG tablet Take 1 tablet (0.1 mg total) by mouth 3 (three) times daily. 60 tablet 11  . clopidogrel (PLAVIX) 75 MG tablet Take 75 mg by mouth daily.    . colchicine 0.6 MG tablet Take 0.6 mg by mouth every other day.     . docusate sodium (COLACE) 100 MG capsule Take 100 mg by mouth 2 (two) times daily.    . dorzolamide-timolol (COSOPT) 22.3-6.8 MG/ML ophthalmic solution Place 1 drop into both eyes 2 (two) times daily.     . enalapril (VASOTEC) 2.5 MG tablet Take 1 tablet (2.5 mg total) by mouth daily. 30 tablet 0  . Flaxseed, Linseed, (EQL FLAXSEED OIL) 1200 MG CAPS Take 1,200 mg by mouth daily.    . hydrALAZINE (APRESOLINE) 100 MG tablet Take 100 mg by mouth 3 (three) times daily.     . hydrOXYzine (ATARAX/VISTARIL) 25 MG tablet Take 25 mg by mouth 4 (four) times daily as needed for itching.     . insulin aspart (NOVOLOG) 100 UNIT/ML FlexPen Inject 10 Units into the skin 3 (three) times daily with meals.     . insulin detemir (LEVEMIR) 100 UNIT/ML injection Inject 0.1 mLs (10 Units total) into the skin daily. (Patient taking differently: Inject 10 Units into the skin at bedtime. ) 10 mL 11  . isosorbide mononitrate (IMDUR) 120 MG 24 hr tablet Take 120 mg by mouth daily.    [name] ketoconazole (NIZORAL) 2 % shampoo Apply 1 application topically 2 (two) times a week.     . multivitamin (RENA-VIT) TABS tablet Take 1 tablet by mouth at bedtime. 30 tablet 0  . nitroGLYCERIN (NITROSTAT) 0.4 MG SL tablet DISSOLVE 1 TABLET UNDER TONGUE FOR CHEST PAIN. May repeat every 5 min. for a max of 3 DOSES, If no relief CALL 911    . oxyCODONE-acetaminophen (PERCOCET/ROXICET) 5-325 MG per tablet Take 1 tablet by mouth 3 (three) times daily.    . pantoprazole (PROTONIX) 40 MG tablet Take 1 tablet (40 mg total) by mouth daily. 40 mg BID for  1 month followed by once daily (Patient taking differently: Take 40 mg by mouth 2 (two) times daily. ) 60 tablet 0  . simethicone (MYLICON) 80 MG chewable tablet Chew 2 tablets (160 mg total) by mouth 4 (four) times daily as needed for flatulence. (Patient not taking: Reported on 12/24/2018) 30 tablet 0  . sodium bicarbonate 650 MG tablet Take 650 mg by mouth 2 (two) times daily.    . SPS 15 GM/60ML suspension Take 60 mLs by mouth as  directed. When eating potassium rich foods  3  . torsemide (DEMADEX) 20 MG tablet Take 2 tablets (40 mg total) by mouth daily. 30 tablet 0  . triamcinolone cream (KENALOG) 0.1 % Apply 1 application topically 2 (two) times daily as needed (ITCHING).       ALLERGIES:  No Known Allergies  FAM HX: Family History  Problem Relation Age of Onset  . CAD Other   . Diabetes Other   . Cancer Other   . Stroke Other     Social History:   reports that he has quit smoking. His smoking use included cigarettes. He has never used smokeless tobacco. He reports previous alcohol use. He reports that he does not use drugs.  ROS: unable to obtain given patient factors  Blood pressure (!) 124/55, pulse 88, temperature 98.9 F (37.2 C), temperature source Oral, resp. rate 16, height 6\' 1"  (1.854 m), weight 117.9 kg, SpO2 100 %. PHYSICAL EXAM: Deferred due to COVID19 risk mitigation strategies.  Reviewed chart in detail for MD physical exam assessments. Discussed with hospitalist here as well.    Results for orders placed or performed during the hospital encounter of 04/11/19 (from the past 48 hour(s))  Comprehensive metabolic panel     Status: Abnormal   Collection Time: 04/11/19  7:36 PM  Result Value Ref Range   Sodium 139 135 - 145 mmol/L   Potassium 3.6 3.5 - 5.1 mmol/L   Chloride 97 (L) 98 - 111 mmol/L   CO2 29 22 - 32 mmol/L   Glucose, Bld 178 (H) 70 - 99 mg/dL   BUN 20 8 - 23 mg/dL   Creatinine, Ser 3.09 (H) 0.61 - 1.24 mg/dL   Calcium 8.4 (L) 8.9 - 10.3 mg/dL    Total Protein 7.9 6.5 - 8.1 g/dL   Albumin 3.9 3.5 - 5.0 g/dL   AST 29 15 - 41 U/L   ALT 30 0 - 44 U/L   Alkaline Phosphatase 150 (H) 38 - 126 U/L   Total Bilirubin 1.2 0.3 - 1.2 mg/dL   GFR calc non Af Amer 18 (L) >60 mL/min   GFR calc Af Amer 21 (L) >60 mL/min   Anion gap 13 5 - 15    Comment: Performed at [facility], [address] [place], [place] [address]  Lactic acid, plasma     Status: None   Collection Time: 04/11/19  7:36 PM  Result Value Ref Range   Lactic Acid, Venous 1.4 0.5 - 1.9 mmol/L    Comment: Performed at [facility], [address] [place], [place] [address]  CBC with Differential     Status: Abnormal   Collection Time: 04/11/19  7:36 PM  Result Value Ref Range   WBC 5.9 4.0 - 10.5 K/uL   RBC 3.73 (L) 4.22 - 5.81 MIL/uL   Hemoglobin 12.0 (L) 13.0 - 17.0 g/dL   HCT 37.9 (L) 39.0 - 52.0 %   MCV 101.6 (H) 80.0 - 100.0 fL   MCH 32.2 26.0 - 34.0 pg   MCHC 31.7 30.0 - 36.0 g/dL   RDW 15.6 (H) 11.5 - 15.5 %   Platelets 166 150 - 400 K/uL   nRBC 0.0 0.0 - 0.2 %   Neutrophils Relative % 75 %   Neutro Abs 4.4 1.7 - 7.7 K/uL   Lymphocytes Relative 13 %   Lymphs Abs 0.8 0.7 - 4.0 K/uL   Monocytes Relative 11 %   Monocytes Absolute 0.6 0.1 - 1.0 K/uL  Eosinophils Relative 1 %   Eosinophils Absolute 0.0 0.0 - 0.5 K/uL   Basophils Relative 0 %   Basophils Absolute 0.0 0.0 - 0.1 K/uL   Immature Granulocytes 0 %   Abs Immature Granulocytes 0.02 0.00 - 0.07 K/uL    Comment: Performed at [facility], [address] [place], [place] [address]  Protime-INR     Status: None   Collection Time: 04/11/19  7:36 PM  Result Value Ref Range   Prothrombin Time 12.8 11.4 - 15.2 seconds   INR 1.0 0.8 - 1.2    Comment: (NOTE) INR goal varies based on device and disease states. Performed at [facility], [address] [place], [place] [address]   Culture, blood (Routine x 2)     Status: None (Preliminary result)   Collection  Time: 04/11/19  7:36 PM  Result Value Ref Range   Specimen Description BLOOD LEFT ANTECUBITAL    Special Requests      BOTTLES DRAWN AEROBIC AND ANAEROBIC Blood Culture results may not be optimal due to an excessive volume of blood received in culture bottles   Culture      NO GROWTH < 12 HOURS Performed at [facility], [address], [place],  [address]    Report Status PENDING   SARS Coronavirus 2 (CEPHEID- Performed in [place] hospital lab), Hosp Order     Status: Abnormal   Collection Time: 04/11/19  7:36 PM  Result Value Ref Range   SARS Coronavirus 2 POSITIVE (A) NEGATIVE    Comment: RESULT CALLED TO, READ BACK BY AND VERIFIED WITH: CALLED TO [place] @2121  04/11/2019 [facility] (NOTE) If result is NEGATIVE SARS-CoV-2 target nucleic acids are NOT DETECTED. The SARS-CoV-2 RNA is generally detectable in upper and lower  respiratory specimens during the acute phase of infection. The lowest  concentration of SARS-CoV-2 viral copies this assay can detect is 250  copies / mL. A negative result does not preclude SARS-CoV-2 infection  and should not be used as the sole basis for treatment or other  patient management decisions.  A negative result may occur with  improper specimen collection / handling, submission of specimen other  than nasopharyngeal swab, presence of viral mutation(s) within the  areas targeted by this assay, and inadequate number of viral copies  (<250 copies / mL). A negative result must be combined with clinical  observations, patient history, and epidemiological information. If result is POSITIVE SARS-CoV-2 target nucleic acids are DETEC TED. The SARS-CoV-2 RNA is generally detectable in upper and lower  respiratory specimens during the acute phase of infection.  Positive  results are indicative of active infection with SARS-CoV-2.  Clinical  correlation with patient history and other diagnostic information is  necessary to determine  patient infection status.  Positive results do  not rule out bacterial infection or co-infection with other viruses. If result is PRESUMPTIVE POSTIVE SARS-CoV-2 nucleic acids MAY BE PRESENT.   A presumptive positive result was obtained on the submitted specimen  and confirmed on repeat testing.  While 2019 novel coronavirus  (SARS-CoV-2) nucleic acids may be present in the submitted sample  additional confirmatory testing may be necessary for epidemiological  and / or clinical management purposes  to differentiate between  SARS-CoV-2 and other Sarbecovirus currently known to infect humans.  If clinically indicated additional testing with an alternate test  methodology (LAB7 [id number]) is advised. The SARS-CoV-2 RNA is generally  detectable in upper and lower respiratory specimens during the  acute  phase of infection. The expected result is Negative. Fact Sheet for Patients:  StrictlyIdeas.no Fact Sheet for Healthcare Providers: BankingDealers.co.za This test is not yet approved or cleared by the [place] FDA and has been authorized for detection and/or diagnosis of SARS-CoV-2 by FDA under an Emergency Use Authorization (EUA).  This EUA will remain in effect (meaning this test can be used) for the duration of the COVID-19 declaration under Section 564(b)(1) of the Act, 21 U.S.C. section 360bbb-3(b)(1), unless the authorization is terminated or revoked sooner. Performed at [facility], [address] [place], [place] [address]   Culture, blood (Routine x 2)     Status: None (Preliminary result)   Collection Time: 04/11/19  7:38 PM  Result Value Ref Range   Specimen Description BLOOD LEFT HAND    Special Requests      BOTTLES DRAWN AEROBIC AND ANAEROBIC Blood Culture adequate volume   Culture      NO GROWTH < 12 HOURS Performed at [facility], [address] [place], [place] [address]    Report Status PENDING    Urinalysis, Complete w Microscopic     Status: Abnormal   Collection Time: 04/11/19  7:56 PM  Result Value Ref Range   Color, Urine YELLOW (A) YELLOW   APPearance CLOUDY (A) CLEAR   Specific Gravity, Urine 1.016 1.005 - 1.030   pH 7.0 5.0 - 8.0   Glucose, UA NEGATIVE NEGATIVE mg/dL   Hgb urine dipstick NEGATIVE NEGATIVE   Bilirubin Urine NEGATIVE NEGATIVE   Ketones, ur NEGATIVE NEGATIVE mg/dL   Protein, ur 100 (A) NEGATIVE mg/dL   Nitrite NEGATIVE NEGATIVE   Leukocytes,Ua LARGE (A) NEGATIVE   RBC / HPF 6-10 0 - 5 RBC/hpf   WBC, UA >50 (H) 0 - 5 WBC/hpf   Bacteria, UA NONE SEEN NONE SEEN   Squamous Epithelial / LPF 0-5 0 - 5   Mucus PRESENT     Comment: Performed at [facility], [address], [place], [place] [address]    Dg Chest 1 View  Result Date: 04/11/2019 CLINICAL DATA:  Cough and fever. EXAM: CHEST  1 VIEW COMPARISON:  10/19/2018 FINDINGS: Post median sternotomy. Chronic cardiomegaly without significant change from prior. Vascular congestion with resolved pulmonary edema from prior exam. No focal airspace disease, pleural effusion, or pneumothorax. Calcified diaphragmatic plaques most evident on the right. IMPRESSION: Chronic cardiomegaly with vascular congestion. No focal airspace opacity. Electronically Signed   By: Keith Rake M.D.   On: 04/11/2019 19:58    DIALYSIS: [facility] ph [phone] MWF, BFR 400 DFR 600 AVF Other parameters not available at this time  Assessment/Plan  **Sepsis secondary to COVID 19:  Pt with reported fever and tachycardia, COVID+.  CXR without infiltrate, sats ok currently on RA to 2L .  Supportive care per primary  **ESRD on HD:  Completed a good portion of his HD tx yesterday prior to ED transfer.  Has some pulmonary congestion on CXR but no indication for HD today.  If remains hospitalized Monday will provide HD here.  Has R BC AVF.  Will need to obtain outpt records - no one answers phone today.   **BMM:  Ca acceptable, check Phos.  Obtain outpt records.   **Anemia:  Hb 12, no indication for ESA.   **DM:  Care per primary; last A1c 6.9.  **HTN: Most recent BP 124/55. Per epic home meds include enalapril 2.5 daily, imdur 120 daily, hydralazine 100 TID  Ria Comment  A [place] 04/12/2019, 12:19 PM

## 2019-04-12 NOTE — Progress Notes (Signed)
CRITICAL VALUE ALERT  Critical Value:  0.26  Date & Time Notied:  04/12/19  1716  Provider Notified: Fuller Plan  Orders Received/Actions taken:

## 2019-04-12 NOTE — ED Notes (Signed)
Pt's son called and given update by this RN.

## 2019-04-12 NOTE — ED Provider Notes (Signed)
-----------------------------------------   12:10 AM on 04/12/2019 -----------------------------------------  Assumed ED care from Dr. Archie Balboa to affect transfer to [facility].  [facility] called and I spoke with the transfer center and with Dr. Blaine Hamper.  We discussed the case and he accepted the patient.  We are now awaiting bed assignment and transportation.   Hinda Kehr, MD 04/12/19 [phone]

## 2019-04-12 NOTE — ED Notes (Signed)
Pt asleep in room at this time. Rise and fall of chest noted by this RN.

## 2019-04-12 NOTE — Plan of Care (Signed)
  Problem: Education: Patient very hard od hearing and also visually impaired, communication hampered by deficit Goal: Knowledge of General Education information will improve Description Including pain rating scale, medication(s)/side effects and non-pharmacologic comfort measures Outcome: Not Progressing

## 2019-04-12 NOTE — ED Notes (Signed)
Pt resting quietly in room at this time and in NAD.

## 2019-04-13 LAB — COMPREHENSIVE METABOLIC PANEL
ALT: 21 U/L (ref 0–44)
AST: 23 U/L (ref 15–41)
Albumin: 3.1 g/dL — ABNORMAL LOW (ref 3.5–5.0)
Alkaline Phosphatase: 120 U/L (ref 38–126)
Anion gap: 17 — ABNORMAL HIGH (ref 5–15)
BUN: 36 mg/dL — ABNORMAL HIGH (ref 8–23)
CO2: 26 mmol/L (ref 22–32)
Calcium: 7.6 mg/dL — ABNORMAL LOW (ref 8.9–10.3)
Chloride: 94 mmol/L — ABNORMAL LOW (ref 98–111)
Creatinine, Ser: 4.84 mg/dL — ABNORMAL HIGH (ref 0.61–1.24)
GFR calc Af Amer: 12 mL/min — ABNORMAL LOW (ref >60)
GFR calc non Af Amer: 11 mL/min — ABNORMAL LOW (ref >60)
Glucose, Bld: 190 mg/dL — ABNORMAL HIGH (ref 70–99)
Potassium: 3.6 mmol/L (ref 3.5–5.1)
Sodium: 137 mmol/L (ref 135–145)
Total Bilirubin: 1 mg/dL (ref 0.3–1.2)
Total Protein: 6.5 g/dL (ref 6.5–8.1)

## 2019-04-13 LAB — CBC WITH DIFFERENTIAL/PLATELET
Abs Immature Granulocytes: 0.02 10*3/uL (ref 0.00–0.07)
Basophils Absolute: 0 10*3/uL (ref 0.0–0.1)
Basophils Relative: 0 %
Eosinophils Absolute: 0 10*3/uL (ref 0.0–0.5)
Eosinophils Relative: 0 %
HCT: 35.9 % — ABNORMAL LOW (ref 39.0–52.0)
Hemoglobin: 11.4 g/dL — ABNORMAL LOW (ref 13.0–17.0)
Immature Granulocytes: 0 %
Lymphocytes Relative: 21 %
Lymphs Abs: 1 10*3/uL (ref 0.7–4.0)
MCH: 32.2 pg (ref 26.0–34.0)
MCHC: 31.8 g/dL (ref 30.0–36.0)
MCV: 101.4 fL — ABNORMAL HIGH (ref 80.0–100.0)
Monocytes Absolute: 0.5 10*3/uL (ref 0.1–1.0)
Monocytes Relative: 10 %
Neutro Abs: 3.1 10*3/uL (ref 1.7–7.7)
Neutrophils Relative %: 69 %
Platelets: 166 10*3/uL (ref 150–400)
RBC: 3.54 MIL/uL — ABNORMAL LOW (ref 4.22–5.81)
RDW: 15.3 % (ref 11.5–15.5)
WBC: 4.5 10*3/uL (ref 4.0–10.5)
nRBC: 0.7 % — ABNORMAL HIGH (ref 0.0–0.2)

## 2019-04-13 LAB — HEPATITIS B SURFACE ANTIGEN: Hepatitis B Surface Ag: NEGATIVE

## 2019-04-13 LAB — GLUCOSE, CAPILLARY
Glucose-Capillary: 149 mg/dL — ABNORMAL HIGH (ref 70–99)
Glucose-Capillary: 212 mg/dL — ABNORMAL HIGH (ref 70–99)
Glucose-Capillary: 160 mg/dL — ABNORMAL HIGH (ref 70–99)
Glucose-Capillary: 147 mg/dL — ABNORMAL HIGH (ref 70–99)

## 2019-04-13 LAB — PHOSPHORUS: Phosphorus: 3.5 mg/dL (ref 2.5–4.6)

## 2019-04-13 LAB — INTERLEUKIN-6, PLASMA: Interleukin-6, Plasma: 43.3 pg/mL — ABNORMAL HIGH (ref 0.0–12.2)

## 2019-04-13 LAB — TRIGLYCERIDES: Triglycerides: 196 mg/dL — ABNORMAL HIGH (ref <150)

## 2019-04-13 LAB — D-DIMER, QUANTITATIVE: D-Dimer, Quant: 0.46 ug/mL-FEU (ref 0.00–0.50)

## 2019-04-13 LAB — MAGNESIUM: Magnesium: 1.3 mg/dL — ABNORMAL LOW (ref 1.7–2.4)

## 2019-04-13 LAB — FERRITIN: Ferritin: 985 ng/mL — ABNORMAL HIGH (ref 24–336)

## 2019-04-13 LAB — C-REACTIVE PROTEIN: CRP: 8.9 mg/dL — ABNORMAL HIGH (ref <1.0)

## 2019-04-13 LAB — CK: Total CK: 81 U/L (ref 49–397)

## 2019-04-13 MED ORDER — ACETAMINOPHEN 325 MG PO TABS
650.0000 mg | ORAL_TABLET | ORAL | Status: DC | PRN
  Administered 2019-04-13 – 2019-05-09 (×16): 650 mg via ORAL
  Filled 2019-04-13 (×17): qty 2

## 2019-04-13 MED ORDER — CHLORHEXIDINE GLUCONATE CLOTH 2 % EX PADS
6.0000 | MEDICATED_PAD | Freq: Every day | CUTANEOUS | Status: DC
  Administered 2019-04-14 – 2019-05-04 (×17): 6 via TOPICAL

## 2019-04-13 MED ORDER — ENALAPRIL MALEATE 2.5 MG PO TABS: 2.5000 mg | ORAL_TABLET | Freq: Every day | ORAL | Status: DC

## 2019-04-13 MED ORDER — SODIUM CHLORIDE 0.9 % IV BOLUS
250.0000 mL | Freq: Once | INTRAVENOUS | Status: AC
  Administered 2019-04-13: 250 mL via INTRAVENOUS

## 2019-04-13 NOTE — Plan of Care (Signed)
  Problem: Education: drowsy, non participatory, needs reinforcement Goal: Knowledge of General Education information will improve Description Including pain rating scale, medication(s)/side effects and non-pharmacologic comfort measures Outcome: Not Progressing

## 2019-04-13 NOTE — Progress Notes (Signed)
[facility] Progress Note   Subjective:   Febrile overnight, BP lower through early AM from 170s to 105-120s.  ON 2L [place] with sats 89%-96%, tachypneic.   Objective Vitals:   04/12/19 2100 04/13/19 0100 04/13/19 0348 04/13/19 0600  BP: 137/64 (!) 175/76 (!) 105/47 (!) 126/53  Pulse: 92 (!) 106 62 89  Resp: (!) 32 (!) 32 (!) 28 (!) 32  Temp: 99.3 F (37.4 C) (!) 102.9 F (39.4 C) 99.5 F (37.5 C) 98.3 F (36.8 C)  TempSrc: Oral Oral Axillary   SpO2: 96% 96% 96% 94%  Weight:      Height:       Physical Exam PE deferred in light of COVID19 mitigation strategies.  Discussed in detail with RN.  Trace edema, normal WOB despite inc RR, no rales  Additional Objective Labs: Basic Metabolic Panel: Recent Labs  Lab 04/11/19 1936 04/12/19 1513  NA 139 138  K 3.6 3.4*  CL 97* 94*  CO2 29 29  GLUCOSE 178* 255*  BUN 20 27*  CREATININE 3.09* 4.23*  CALCIUM 8.4* 7.9*   Liver Function Tests: Recent Labs  Lab 04/11/19 1936 04/12/19 1513  AST 29 24  ALT 30 27  ALKPHOS 150* 130*  BILITOT 1.2 1.0  PROT 7.9 7.0  ALBUMIN 3.9 3.4*   No results for input(s): LIPASE, AMYLASE in the last 168 hours. CBC: Recent Labs  Lab 04/11/19 1936 04/12/19 1513  WBC 5.9 4.7  NEUTROABS 4.4 3.6  HGB 12.0* 12.0*  HCT 37.9* 37.6*  MCV 101.6* 100.8*  PLT 166 160   Blood Culture    Component Value Date/Time   SDES BLOOD LEFT HAND 04/11/2019 1938   SPECREQUEST  04/11/2019 1938    BOTTLES DRAWN AEROBIC AND ANAEROBIC Blood Culture adequate volume   CULT  04/11/2019 1938    NO GROWTH 2 DAYS Performed at [facility], [address] [place], [place] [address]    REPTSTATUS PENDING 04/11/2019 1938    Cardiac Enzymes: Recent Labs  Lab 04/12/19 1513  TROPONINI 0.26*   CBG: Recent Labs  Lab 04/12/19 1642 04/12/19 2026  GLUCAP 236* 156*   Iron Studies:  Recent Labs    04/12/19 1513  FERRITIN 931*   @[email or web address] @ Studies/Results: Dg Chest 1  View  Result Date: 04/11/2019 CLINICAL DATA:  Cough and fever. EXAM: CHEST  1 VIEW COMPARISON:  10/19/2018 FINDINGS: Post median sternotomy. Chronic cardiomegaly without significant change from prior. Vascular congestion with resolved pulmonary edema from prior exam. No focal airspace disease, pleural effusion, or pneumothorax. Calcified diaphragmatic plaques most evident on the right. IMPRESSION: Chronic cardiomegaly with vascular congestion. No focal airspace opacity. Electronically Signed   By: Keith Rake M.D.   On: 04/11/2019 19:58   Medications: . cefTRIAXone (ROCEPHIN)  IV 1 g (04/13/19 0003)   . amLODipine  10 mg Oral Daily  . atorvastatin  40 mg Oral QHS  . carvedilol  6.25 mg Oral BID WC  . cloNIDine  0.1 mg Oral TID  . clopidogrel  75 mg Oral Daily  . docusate sodium  100 mg Oral BID  . dorzolamide-timolol  1 drop Both Eyes BID  . heparin  5,000 Units Subcutaneous Q8H  . insulin aspart  0-15 Units Subcutaneous TID WC  . insulin detemir  10 Units Subcutaneous QHS  . Ipratropium-Albuterol  1 puff Inhalation Q6H  . pantoprazole  40 mg Oral BID  . potassium chloride  20 mEq Oral STAT  . sodium bicarbonate  650 mg  Oral BID  . sodium chloride flush  3 mL Intravenous Q12H  . torsemide  40 mg Oral Q0600  . vitamin C  500 mg Oral Daily  . zinc sulfate  220 mg Oral Daily    Dialysis Orders:  [facility] ph [phone]  MWF, BFR 400 DFR 600 AVF 4 hours EDW 116kg 2K/3Ca  Assessment/Plan: **Sepsis secondary to COVID 19:  Pt with reported fever and tachycardia, COVID+.  CXR without infiltrate, sats ok yesterday on RA to 2L [place]; up to 4L [place] for now but per RN on CPAP for sleeping at home but unable to use here due to aerosolizing risk thus inc O2 during sleep.  Care per primary.  **ESRD on HD:  Completed a good portion of his HD tx Friday prior to ED transfer.  Has some pulmonary congestion on CXR but no indication for HD today.  Plan HD tomorrow per outpt schedule, UF  to EDW 116kg. Tight heparin.  Has R BC AVF.    **BMM: Ca acceptable, Phos 3.5.    **Anemia:  Hb 12, no indication for ESA.   **DM:  Care per primary; last A1c 6.9.  **HTN: Most recent BP 135/55. On amlodipine 10, coreg 6.25 BID, clonidine 0.1 TID, torsemide 40 daily.   Jannifer Hick MD 04/13/2019, 7:13 AM  [facility] Pager: [phone]

## 2019-04-13 NOTE — Progress Notes (Signed)
[name]  PROGRESS NOTE    Airon Sahni  FYB:[id number] DOB: 11/16/1941 DOA: 04/12/2019 PCP: [name], MD   Brief Narrative:   Cory Miller is a 78 y.o. 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  Reports having fevers up to 101 F at home.  Patient is not normally on oxygen, but found himself getting easily winded even with minimal exertion.  Associated symptoms included sore throat, nasal congestion, diarrhea earlier in the week, and intermittent chest pain.  He had continued to go to hemodialysis sessions and had a near complete dialysis session on 5/15.  However, was sent to the emergency department for further evaluation due to symptoms.  On admission patient was noted to blood pressures up to 191/71, pulse 107, respirations 24, O2 saturations 94% on 2 L nasal cannula oxygen.  Lab work revealed WBC 5.9, potassium 3.6, CO2 29, BUN 20, creatinine 3.09, and.  Lactic acid 1.4.  Chest x-ray showed chronic cardiomegaly with basilar congestion without any focal airspace opacity noted.  COVID-19 testing was positive.  Transfer was requested for need of hemodialysis and COVID-19 testing.  Patient was accepted to a progressive bed here at [facility] for need of hemodialysis.   Assessment & Plan:   Principal Problem:   COVID-19 virus infection Active Problems:   Diabetes ([place])   HTN (hypertension)   End stage renal disease (HCC)   Hypokalemia   Anemia of chronic disease   COVID 19 positive:      - Patient presents with complaints of fever, chills, shortness of breath, and generalized malaise.      - Chest x-ray noted cardiomegaly without any signs of any focal infiltrate or edema.       - Found to be COVID-19 positive, but with limited oxygen requirements at this time.     -  Continuous pulse oximetry with nasal cannula oxygen as needed     - Combivent inhaler     - follow ferritin, CRP   ESRD on HD:      - Patient last had a nearly complete dialysis session on 5/16.     - Renal diet and carb modified with fluid restriction     - HD per nephrology  Possible UTI/asymptomatic bacteriuria:      - UA urine positive for large leukocytes with >50 WBCs.     - UCx: pending     - Rocephin IV  Second-degree heart block:      - New finding on EKG when compared to previous tracings.     - Continue to monitor     - Consult cardiology if needed  Essential hypertension:      - Blood pressure seen to be elevated up to 191/71.     - Continue Norvasc, Coreg, clonidine, torsemide and hydralazine.   Diabetes mellitus type 2:      - Last hemoglobin A1c noted to be 6.9 in 09/2018.     - Hypoglycemic protocol     - Continue home Levemir 10 units at bedtime     - CBGs with moderate sliding scale insulin  Anemia of chronic disease:      - Hemoglobin appears stable at 12     - Continue to monitor  Hypokalemia:      - Acute.       -  Recheck potassium noted to be 3.4 after blood work obtained.     - resolved    DVT prophylaxis: heparin Code Status: FULL   Disposition Plan: TBD   Consultants:   Nephrology  Antimicrobials:  . Rocephin    Subjective: "I'm ok"  Objective: Vitals:   04/13/19 0100 04/13/19 0348 04/13/19 0600 04/13/19 1011  BP: (!) 175/76 (!) 105/47 (!) 126/53 (!) 135/55  Pulse: (!) 106 62 89 90  Resp: (!) 32 (!) 28 (!) 32 (!) 22  Temp: (!) 102.9 F (39.4 C) 99.5 F (37.5 C) 98.3 F (36.8 C) 98.7 F (37.1 C)  TempSrc: Oral Axillary  Axillary  SpO2: 96% 96% 94% (!) 89%  Weight:      Height:        Intake/Output Summary (Last 24 hours) at 04/13/2019 1806 Last data filed at 04/13/2019 0600 Gross per 24 hour  Intake 570 ml  Output 400 ml  Net 170 ml   Filed Weights   04/12/19 1000  Weight: 117.9 kg    Examination:   General: 78 y.o. male resting in bed in NAD Cardiovascular: RRR, +S1, S2, no m/g/r, equal pulses throughout, RUE fistula Respiratory: CTABL, no w/r/r, normal WOB GI: BS+, NDNT, no masses noted, no organomegaly noted MSK: No e/c/c Skin: No rashes, bruises, ulcerations noted Neuro: A&O x 3, no focal deficits, very hard of hearing Psyc: Appropriate interaction and affect, calm/cooperative    Data Reviewed: I have personally reviewed following labs and imaging studies.  CBC: Recent Labs  Lab 04/11/19 1936 04/12/19 1513 04/13/19 0629  WBC 5.9 4.7 4.5  NEUTROABS 4.4 3.6 3.1  HGB 12.0* 12.0* 11.4*  HCT 37.9* 37.6* 35.9*  MCV 101.6* 100.8* 101.4*  PLT 166 160 127   Basic Metabolic Panel: Recent Labs  Lab 04/11/19 1936 04/12/19 1513 04/13/19 0629  NA 139 138 137  K 3.6 3.4* 3.6  CL 97* 94* 94*  CO2 29 29 26   GLUCOSE 178* 255* 190*  BUN 20 27* 36*  CREATININE 3.09* 4.23* 4.84*  CALCIUM 8.4* 7.9* 7.6*  MG  --   --  1.3*  PHOS  --   --  3.5   GFR: Estimated Creatinine Clearance: 17.2 mL/min (A) (by C-G formula based on SCr of 4.84 mg/dL (H)). Liver Function Tests: Recent Labs  Lab 04/11/19 1936 04/12/19 1513 04/13/19 0629  AST 29 24 23   ALT 30 27 21   ALKPHOS 150* 130* 120  BILITOT 1.2 1.0 1.0  PROT 7.9 7.0 6.5  ALBUMIN 3.9 3.4* 3.1*   No results for input(s): LIPASE, AMYLASE in the last 168 hours. No results for input(s): AMMONIA in the last 168 hours. Coagulation Profile: Recent Labs  Lab 04/11/19 1936  INR 1.0   Cardiac Enzymes: Recent Labs  Lab 04/12/19 1513 04/13/19 0629  CKTOTAL  --  81  TROPONINI 0.26*  --    BNP (last 3 results) No results for input(s): PROBNP in the last 8760 hours. HbA1C: No results for input(s): HGBA1C in the last 72 hours. CBG: Recent Labs  Lab 04/12/19 1642 04/12/19 2026 04/13/19 1006 04/13/19 1403  GLUCAP 236* 156* 149* 212*   Lipid Profile: Recent Labs    04/12/19 1513 04/13/19 0629  TRIG 203* 196*    Thyroid Function Tests: No results for input(s): TSH, T4TOTAL, FREET4, T3FREE, THYROIDAB in the last 72 hours. Anemia Panel: Recent Labs    04/12/19 1513 04/13/19 0629  FERRITIN 931* 985*   Sepsis Labs: Recent Labs  Lab 04/11/19 1936  04/12/19 1513  PROCALCITON  --  0.90  LATICACIDVEN 1.4  --     Recent Results (from the past 240 hour(s))  Culture, blood (Routine x 2)     Status: None (Preliminary result)   Collection Time: 04/11/19  7:36 PM  Result Value Ref Range Status   Specimen Description BLOOD LEFT ANTECUBITAL  Final   Special Requests   Final    BOTTLES DRAWN AEROBIC AND ANAEROBIC Blood Culture results may not be optimal due to an excessive volume of blood received in culture bottles   Culture   Final    NO GROWTH 2 DAYS Performed at [facility], [address], [place], [place] [address]    Report Status PENDING  Incomplete  SARS Coronavirus 2 (CEPHEID- Performed in [place] hospital lab), Hosp Order     Status: Abnormal   Collection Time: 04/11/19  7:36 PM  Result Value Ref Range Status   SARS Coronavirus 2 POSITIVE (A) NEGATIVE Final    Comment: RESULT CALLED TO, READ BACK BY AND VERIFIED WITH: CALLED TO [place] @2121  04/11/2019 [facility] (NOTE) If result is NEGATIVE SARS-CoV-2 target nucleic acids are NOT DETECTED. The SARS-CoV-2 RNA is generally detectable in upper and lower  respiratory specimens during the acute phase of infection. The lowest  concentration of SARS-CoV-2 viral copies this assay can detect is 250  copies / mL. A negative result does not preclude SARS-CoV-2 infection  and should not be used as the sole basis for treatment or other  patient management decisions.  A negative result may occur with  improper specimen collection / handling, submission of specimen other  than nasopharyngeal swab, presence of viral mutation(s) within the  areas targeted by this assay, and inadequate number of viral copies  (<250 copies / mL). A  negative result must be combined with clinical  observations, patient history, and epidemiological information. If result is POSITIVE SARS-CoV-2 target nucleic acids are DETEC TED. The SARS-CoV-2 RNA is generally detectable in upper and lower  respiratory specimens during the acute phase of infection.  Positive  results are indicative of active infection with SARS-CoV-2.  Clinical  correlation with patient history and other diagnostic information is  necessary to determine patient infection status.  Positive results do  not rule out bacterial infection or co-infection with other viruses. If result is PRESUMPTIVE POSTIVE SARS-CoV-2 nucleic acids MAY BE PRESENT.   A presumptive positive result was obtained on the submitted specimen  and confirmed on repeat testing.  While 2019 novel coronavirus  (SARS-CoV-2) nucleic acids may be present in the submitted sample  additional confirmatory testing may be necessary for epidemiological  and / or clinical management purposes  to differentiate between  SARS-CoV-2 and other Sarbecovirus currently known to infect humans.  If clinically indicated additional testing with an alternate test  methodology (LAB7 453) is advised. The SARS-CoV-2 RNA is generally  detectable in upper and lower respiratory specimens during the acute  phase of infection. The expected result is Negative. Fact Sheet for Patients:  StrictlyIdeas.no Fact Sheet for Healthcare Providers: BankingDealers.co.za This test is not yet approved or cleared by the [place] FDA and has been authorized for detection and/or diagnosis of SARS-CoV-2 by FDA under an Emergency Use Authorization (EUA).  This EUA will remain in effect (meaning this test can be used) for the duration of the COVID-19 declaration under Section 564(b)(1) of the Act, 21 U.S.C. section 360bbb-3(b)(1), unless the authorization is terminated or revoked sooner. Performed  at [facility], [address] [place], [place] [address]   Culture, blood (Routine x 2)     Status: None (Preliminary result)   Collection Time: 04/11/19  7:38 PM  Result Value Ref Range Status   Specimen Description BLOOD LEFT HAND  Final   Special Requests   Final    BOTTLES DRAWN AEROBIC AND ANAEROBIC Blood Culture adequate volume   Culture   Final    NO GROWTH 2 DAYS Performed at [facility], [address], [place], [place] [address]    Report Status PENDING  Incomplete         Radiology Studies: Dg Chest 1 View  Result Date: 04/11/2019 CLINICAL DATA:  Cough and fever. EXAM: CHEST  1 VIEW COMPARISON:  10/19/2018 FINDINGS: Post median sternotomy. Chronic cardiomegaly without significant change from prior. Vascular congestion with resolved pulmonary edema from prior exam. No focal airspace disease, pleural effusion, or pneumothorax. Calcified diaphragmatic plaques most evident on the right. IMPRESSION: Chronic cardiomegaly with vascular congestion. No focal airspace opacity. Electronically Signed   By: Keith Rake M.D.   On: 04/11/2019 19:58        Scheduled Meds: . amLODipine  10 mg Oral Daily  . atorvastatin  40 mg Oral QHS  . carvedilol  6.25 mg Oral BID WC  . Chlorhexidine Gluconate Cloth  6 each Topical Q0600  . cloNIDine  0.1 mg Oral TID  . clopidogrel  75 mg Oral Daily  . docusate sodium  100 mg Oral BID  . dorzolamide-timolol  1 drop Both Eyes BID  . heparin  5,000 Units Subcutaneous Q8H  . insulin aspart  0-15 Units Subcutaneous TID WC  . insulin detemir  10 Units Subcutaneous QHS  . Ipratropium-Albuterol  1 puff Inhalation Q6H  . pantoprazole  40 mg Oral BID  . potassium chloride  20 mEq Oral STAT  . sodium bicarbonate  650 mg Oral BID  . sodium chloride flush  3 mL Intravenous Q12H  . torsemide  40 mg Oral Q0600  . vitamin C  500 mg Oral Daily  . zinc sulfate  220 mg Oral Daily   Continuous Infusions: . cefTRIAXone  (ROCEPHIN)  IV 1 g (04/13/19 0003)     LOS: 1 day    Time spent: 35 minutes spent in the coordination of care today.    Jonnie Finner, DO [facility] Pager [phone]  If 7PM-7AM, please contact night-coverage [email or web address] Password [facility] 04/13/2019, 6:06 PM

## 2019-04-14 LAB — GLUCOSE, CAPILLARY
Glucose-Capillary: 171 mg/dL — ABNORMAL HIGH (ref 70–99)
Glucose-Capillary: 202 mg/dL — ABNORMAL HIGH (ref 70–99)
Glucose-Capillary: 104 mg/dL — ABNORMAL HIGH (ref 70–99)
Glucose-Capillary: 266 mg/dL — ABNORMAL HIGH (ref 70–99)

## 2019-04-14 LAB — CBC
HCT: 34.4 % — ABNORMAL LOW (ref 39.0–52.0)
Hemoglobin: 11.2 g/dL — ABNORMAL LOW (ref 13.0–17.0)
MCH: 32.1 pg (ref 26.0–34.0)
MCHC: 32.6 g/dL (ref 30.0–36.0)
MCV: 98.6 fL (ref 80.0–100.0)
Platelets: 180 10*3/uL (ref 150–400)
RBC: 3.49 MIL/uL — ABNORMAL LOW (ref 4.22–5.81)
RDW: 15.1 % (ref 11.5–15.5)
WBC: 4.1 10*3/uL (ref 4.0–10.5)
nRBC: 0 % (ref 0.0–0.2)

## 2019-04-14 LAB — CBC WITH DIFFERENTIAL/PLATELET
Abs Immature Granulocytes: 0.01 10*3/uL (ref 0.00–0.07)
Basophils Absolute: 0 10*3/uL (ref 0.0–0.1)
Basophils Relative: 0 %
Eosinophils Absolute: 0 10*3/uL (ref 0.0–0.5)
Eosinophils Relative: 1 %
HCT: 34.2 % — ABNORMAL LOW (ref 39.0–52.0)
Hemoglobin: 10.9 g/dL — ABNORMAL LOW (ref 13.0–17.0)
Immature Granulocytes: 0 %
Lymphocytes Relative: 28 %
Lymphs Abs: 1.1 10*3/uL (ref 0.7–4.0)
MCH: 31.7 pg (ref 26.0–34.0)
MCHC: 31.9 g/dL (ref 30.0–36.0)
MCV: 99.4 fL (ref 80.0–100.0)
Monocytes Absolute: 0.4 10*3/uL (ref 0.1–1.0)
Monocytes Relative: 10 %
Neutro Abs: 2.3 10*3/uL (ref 1.7–7.7)
Neutrophils Relative %: 61 %
Platelets: 164 10*3/uL (ref 150–400)
RBC: 3.44 MIL/uL — ABNORMAL LOW (ref 4.22–5.81)
RDW: 15.2 % (ref 11.5–15.5)
WBC: 3.8 10*3/uL — ABNORMAL LOW (ref 4.0–10.5)
nRBC: 0 % (ref 0.0–0.2)

## 2019-04-14 LAB — COMPREHENSIVE METABOLIC PANEL
ALT: 21 U/L (ref 0–44)
AST: 23 U/L (ref 15–41)
Albumin: 2.8 g/dL — ABNORMAL LOW (ref 3.5–5.0)
Alkaline Phosphatase: 113 U/L (ref 38–126)
Anion gap: 15 (ref 5–15)
BUN: 56 mg/dL — ABNORMAL HIGH (ref 8–23)
CO2: 26 mmol/L (ref 22–32)
Calcium: 6.9 mg/dL — ABNORMAL LOW (ref 8.9–10.3)
Chloride: 95 mmol/L — ABNORMAL LOW (ref 98–111)
Creatinine, Ser: 6.28 mg/dL — ABNORMAL HIGH (ref 0.61–1.24)
GFR calc Af Amer: 9 mL/min — ABNORMAL LOW (ref >60)
GFR calc non Af Amer: 8 mL/min — ABNORMAL LOW (ref >60)
Glucose, Bld: 171 mg/dL — ABNORMAL HIGH (ref 70–99)
Potassium: 3.6 mmol/L (ref 3.5–5.1)
Sodium: 136 mmol/L (ref 135–145)
Total Bilirubin: 1.1 mg/dL (ref 0.3–1.2)
Total Protein: 6.2 g/dL — ABNORMAL LOW (ref 6.5–8.1)

## 2019-04-14 LAB — CK: Total CK: 86 U/L (ref 49–397)

## 2019-04-14 LAB — GLUCOSE 6 PHOSPHATE DEHYDROGENASE
G6PDH: 10.6 U/g{Hb} (ref 4.6–13.5)
Hemoglobin: 11.9 g/dL — ABNORMAL LOW (ref 13.0–17.7)

## 2019-04-14 LAB — MAGNESIUM: Magnesium: 1.3 mg/dL — ABNORMAL LOW (ref 1.7–2.4)

## 2019-04-14 LAB — C-REACTIVE PROTEIN: CRP: 9.6 mg/dL — ABNORMAL HIGH (ref <1.0)

## 2019-04-14 LAB — TRIGLYCERIDES: Triglycerides: 182 mg/dL — ABNORMAL HIGH (ref <150)

## 2019-04-14 LAB — FERRITIN: Ferritin: 1146 ng/mL — ABNORMAL HIGH (ref 24–336)

## 2019-04-14 LAB — PHOSPHORUS: Phosphorus: 3.8 mg/dL (ref 2.5–4.6)

## 2019-04-14 LAB — D-DIMER, QUANTITATIVE: D-Dimer, Quant: 0.72 ug/mL-FEU — ABNORMAL HIGH (ref 0.00–0.50)

## 2019-04-14 LAB — INTERLEUKIN-6, PLASMA: Interleukin-6, Plasma: 64.7 pg/mL — ABNORMAL HIGH (ref 0.0–12.2)

## 2019-04-14 MED ORDER — HEPARIN SODIUM (PORCINE) 1000 UNIT/ML DIALYSIS
1000.0000 [IU] | INTRAMUSCULAR | Status: DC | PRN
  Filled 2019-04-14: qty 1

## 2019-04-14 MED ORDER — ALTEPLASE 2 MG IJ SOLR: 2.0000 mg | Freq: Once | INTRAMUSCULAR | Status: DC | PRN

## 2019-04-14 MED ORDER — HEPARIN SODIUM (PORCINE) 1000 UNIT/ML DIALYSIS
20.0000 [IU]/kg | INTRAMUSCULAR | Status: DC | PRN
  Administered 2019-04-28 – 2019-05-07 (×3): 2400 [IU] via INTRAVENOUS_CENTRAL
  Filled 2019-04-14 (×4): qty 3

## 2019-04-14 MED ORDER — PENTAFLUOROPROP-TETRAFLUOROETH EX AERO: 1.0000 "application " | INHALATION_SPRAY | CUTANEOUS | Status: DC | PRN

## 2019-04-14 MED ORDER — SODIUM CHLORIDE 0.9 % IV SOLN: 100.0000 mL | INTRAVENOUS | Status: DC | PRN

## 2019-04-14 MED ORDER — LIDOCAINE HCL (PF) 1 % IJ SOLN: 5.0000 mL | INTRAMUSCULAR | Status: DC | PRN

## 2019-04-14 MED ORDER — LIDOCAINE-PRILOCAINE 2.5-2.5 % EX CREA
1.0000 "application " | TOPICAL_CREAM | CUTANEOUS | Status: DC | PRN
  Filled 2019-04-14: qty 5

## 2019-04-14 NOTE — Progress Notes (Addendum)
Renal Navigator spoke with Clinic Manager at [facility] [place]/D. Melton to ensure that she is aware of patient's COVID positive status. She states she has been notified. She states patient will treat in an isolation TTS schedule 1st shift while positive at [place] clinic. Renal Navigator will update Clinic Manager of discharge plan once patient is medically ready.  Alphonzo Cruise Renal Navigator [phone]

## 2019-04-14 NOTE — Progress Notes (Signed)
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 Reports having fevers up to 101 F at home. Patient is not normally on oxygen, but found himself getting easily winded even with minimal exertion. Associated symptoms included sore throat, nasal congestion, diarrhea earlier in the week, and intermittent chest pain. He had continued to go to hemodialysis sessions and had a near complete dialysis session on 5/15. However, was sent to the emergency department for further evaluation due to symptoms. On admission patient was noted to blood pressures up to 191/71, pulse 107, respirations24, O2 saturations 94% on 2 L nasal cannula oxygen. Lab work revealed WBC 5.9, potassium 3.6, CO2 29, BUN 20, creatinine 3.09, and. Lactic acid 1.4. Chest x-ray showed chronic cardiomegaly with basilar congestion without any focal airspace opacity noted. COVID-19 testing was positive. Transfer was requested for need of hemodialysis and COVID-19 testing. Patient was accepted to a progressive bed here at [facility] for need of hemodialysis.   Assessment & Plan:   Principal Problem:   COVID-19 virus infection Active Problems:   Diabetes ([place])   HTN (hypertension)   End stage renal disease (HCC)   Hypokalemia   Anemia of chronic disease   COVID 19 positive:      - Patient presents with complaints of fever, chills, shortness of breath, and generalized malaise.      - Chest x-ray noted cardiomegaly without any signs of any focal infiltrate or edema.       - Found to be COVID-19 positive, but with limited oxygen requirements at this time.     -  Continuous pulse oximetry with nasal cannula oxygen as needed     - Combivent inhaler     - follow ferritin, CRP uptredning; continue to follow   ESRD on HD:      - Patient last had a nearly complete dialysis session on 5/16.     - Renal diet and carb modified with fluid restriction     - HD per nephrology  Possible UTI/asymptomatic bacteriuria:      - UA urine positive for large leukocytes with >50 WBCs.     - UCx: pending     - Rocephin IV  Second-degree heart block:      - New finding on EKG when compared to previous tracings.     - Continue to monitor     - Consult cardiology if needed  Essential hypertension:      - Blood pressure seen to be elevated up to 191/71.     - Continue Norvasc, Coreg, clonidine, torsemide and hydralazine.   Diabetes mellitus type 2:      - Last hemoglobin A1c noted to be 6.9 in 09/2018.     - Hypoglycemic protocol     - Continue home Levemir 10 units at bedtime     - CBGs with moderate sliding scale insulin  Anemia of chronic disease:      - Hemoglobin appears stable at 12     - Continue to monitor  Hypokalemia:      - Acute.       - Recheck potassium noted to be 3.4 after blood work obtained.     -  resolved  Ferritin, CRP showing uptrend which is concerning. He looks ok this AM. I would say let's watch him here, trend the ferritin/CRP, transition to orals for UTI and consider an AM d/c dependent on lab work/clinical status.Continue as above  DVT prophylaxis: heparin Code Status: FULL   Disposition Plan: TBD   Consultants:   Nephrology  Antimicrobials:  . Rocephin    Subjective: "Ok. Thank you."  Objective: Vitals:   04/14/19 1500 04/14/19 1515 04/14/19 1530 04/14/19 1545  BP: 139/65 (!) 186/79 (!) 164/67 (!) 164/67  Pulse: 88 78 89 79  Resp: (!) 26 (!) 22 (!) 28 (!) 27  Temp:      TempSrc:      SpO2:      Weight:      Height:        Intake/Output Summary (Last 24 hours) at 04/14/2019 1619 Last data filed  at 04/14/2019 0300 Gross per 24 hour  Intake -  Output 200 ml  Net -200 ml   Filed Weights   04/12/19 1000 04/14/19 0500 04/14/19 1305  Weight: 117.9 kg 113 kg 115.2 kg    Examination:  General: 78 y.o. male resting in bed in NAD Cardiovascular: RRR, +S1, S2, no m/g/r, equal pulses throughout, RUE fistula Respiratory: CTABL, no w/r/r, normal WOB GI: BS+, NDNT, no masses noted, no organomegaly noted MSK: No e/c/c Skin: No rashes, bruises, ulcerations noted Neuro: A&O x 3, no focal deficits, very hard of hearing Psyc: Appropriate interaction and affect, calm/cooperative    Data Reviewed: I have personally reviewed following labs and imaging studies.  CBC: Recent Labs  Lab 04/11/19 1936 04/12/19 1513 04/13/19 0629 04/14/19 0805 04/14/19 1410  WBC 5.9 4.7 4.5 3.8* 4.1  NEUTROABS 4.4 3.6 3.1 2.3  --   HGB 12.0* 12.0*  11.9* 11.4* 10.9* 11.2*  HCT 37.9* 37.6* 35.9* 34.2* 34.4*  MCV 101.6* 100.8* 101.4* 99.4 98.6  PLT 166 160 166 164 270   Basic Metabolic Panel: Recent Labs  Lab 04/11/19 1936 04/12/19 1513 04/13/19 0629 04/14/19 0805  NA 139 138 137 136  K 3.6 3.4* 3.6 3.6  CL 97* 94* 94* 95*  CO2 29 29 26 26   GLUCOSE 178* 255* 190* 171*  BUN 20 27* 36* 56*  CREATININE 3.09* 4.23* 4.84* 6.28*  CALCIUM 8.4* 7.9* 7.6* 6.9*  MG  --   --  1.3* 1.3*  PHOS  --   --  3.5 3.8   GFR: Estimated Creatinine Clearance: 13.1 mL/min (A) (by C-G formula based on SCr of 6.28 mg/dL (H)). Liver Function Tests: Recent Labs  Lab 04/11/19 1936 04/12/19 1513 04/13/19 0629 04/14/19 0805  AST 29 24 23 23   ALT 30 27 21 21   ALKPHOS 150* 130* 120 113  BILITOT 1.2 1.0 1.0 1.1  PROT 7.9 7.0 6.5 6.2*  ALBUMIN 3.9 3.4* 3.1* 2.8*   No results for input(s): LIPASE, AMYLASE in the last 168 hours. No results for input(s): AMMONIA in the last 168 hours. Coagulation Profile: Recent Labs  Lab 04/11/19 1936  INR 1.0   Cardiac Enzymes: Recent Labs  Lab 04/12/19 1513 04/13/19  0629 04/14/19 0805  CKTOTAL  --  81 86  TROPONINI 0.26*  --   --    BNP (last 3 results) No results for input(s): PROBNP in the last 8760 hours. HbA1C: No results for input(s): HGBA1C in the last 72 hours. CBG: Recent Labs  Lab 04/13/19 1403 04/13/19 1812 04/13/19 2203 04/14/19 0906 04/14/19 1150  GLUCAP 212* 160*  147* 171* 202*   Lipid Profile: Recent Labs    04/13/19 0629 04/14/19 0805  TRIG 196* 182*   Thyroid Function Tests: No results for input(s): TSH, T4TOTAL, FREET4, T3FREE, THYROIDAB in the last 72 hours. Anemia Panel: Recent Labs    04/13/19 0629 04/14/19 0805  FERRITIN 985* 1,146*   Sepsis Labs: Recent Labs  Lab 04/11/19 1936 04/12/19 1513  PROCALCITON  --  0.90  LATICACIDVEN 1.4  --     Recent Results (from the past 240 hour(s))  Culture, blood (Routine x 2)     Status: None (Preliminary result)   Collection Time: 04/11/19  7:36 PM  Result Value Ref Range Status   Specimen Description BLOOD LEFT ANTECUBITAL  Final   Special Requests   Final    BOTTLES DRAWN AEROBIC AND ANAEROBIC Blood Culture results may not be optimal due to an excessive volume of blood received in culture bottles   Culture   Final    NO GROWTH 3 DAYS Performed at [facility], [address], [place], [place] [address]    Report Status PENDING  Incomplete  SARS Coronavirus 2 (CEPHEID- Performed in [place] hospital lab), Hosp Order     Status: Abnormal   Collection Time: 04/11/19  7:36 PM  Result Value Ref Range Status   SARS Coronavirus 2 POSITIVE (A) NEGATIVE Final    Comment: RESULT CALLED TO, READ BACK BY AND VERIFIED WITH: CALLED TO [place] @2121  04/11/2019 [place] (NOTE) If result is NEGATIVE SARS-CoV-2 target nucleic acids are NOT DETECTED. The SARS-CoV-2 RNA is generally detectable in upper and lower  respiratory specimens during the acute phase of infection. The lowest  concentration of SARS-CoV-2 viral copies this assay can detect is 250  copies  / mL. A negative result does not preclude SARS-CoV-2 infection  and should not be used as the sole basis for treatment or other  patient management decisions.  A negative result may occur with  improper specimen collection / handling, submission of specimen other  than nasopharyngeal swab, presence of viral mutation(s) within the  areas targeted by this assay, and inadequate number of viral copies  (<250 copies / mL). A negative result must be combined with clinical  observations, patient history, and epidemiological information. If result is POSITIVE SARS-CoV-2 target nucleic acids are DETEC TED. The SARS-CoV-2 RNA is generally detectable in upper and lower  respiratory specimens during the acute phase of infection.  Positive  results are indicative of active infection with SARS-CoV-2.  Clinical  correlation with patient history and other diagnostic information is  necessary to determine patient infection status.  Positive results do  not rule out bacterial infection or co-infection with other viruses. If result is PRESUMPTIVE POSTIVE SARS-CoV-2 nucleic acids MAY BE PRESENT.   A presumptive positive result was obtained on the submitted specimen  and confirmed on repeat testing.  While 2019 novel coronavirus  (SARS-CoV-2) nucleic acids may be present in the submitted sample  additional confirmatory testing may be necessary for epidemiological  and / or clinical management purposes  to differentiate between  SARS-CoV-2 and other Sarbecovirus currently known to infect humans.  If clinically indicated additional testing with an alternate test  methodology (LAB7 453) is advised. The SARS-CoV-2 RNA is generally  detectable in upper and lower respiratory specimens during the acute  phase of infection. The expected result is Negative. Fact Sheet for Patients:  StrictlyIdeas.no Fact Sheet for Healthcare Providers: BankingDealers.co.za This test  is not yet approved or cleared  by the [place] and has been authorized for detection and/or diagnosis of SARS-CoV-2 by FDA under an Emergency Use Authorization (EUA).  This EUA will remain in effect (meaning this test can be used) for the duration of the COVID-19 declaration under Section 564(b)(1) of the Act, 21 U.S.C. section 360bbb-3(b)(1), unless the authorization is terminated or revoked sooner. Performed at [facility], [address] [place], [place] [address]   Culture, blood (Routine x 2)     Status: None (Preliminary result)   Collection Time: 04/11/19  7:38 PM  Result Value Ref Range Status   Specimen Description BLOOD LEFT HAND  Final   Special Requests   Final    BOTTLES DRAWN AEROBIC AND ANAEROBIC Blood Culture adequate volume   Culture   Final    NO GROWTH 3 DAYS Performed at [facility], [address], [place], [place] [address]    Report Status PENDING  Incomplete         Radiology Studies: No results found.      Scheduled Meds: . amLODipine  10 mg Oral Daily  . atorvastatin  40 mg Oral QHS  . carvedilol  6.25 mg Oral BID WC  . Chlorhexidine Gluconate Cloth  6 each Topical Q0600  . cloNIDine  0.1 mg Oral TID  . clopidogrel  75 mg Oral Daily  . docusate sodium  100 mg Oral BID  . dorzolamide-timolol  1 drop Both Eyes BID  . heparin  5,000 Units Subcutaneous Q8H  . insulin aspart  0-15 Units Subcutaneous TID WC  . insulin detemir  10 Units Subcutaneous QHS  . Ipratropium-Albuterol  1 puff Inhalation Q6H  . pantoprazole  40 mg Oral BID  . sodium bicarbonate  650 mg Oral BID  . sodium chloride flush  3 mL Intravenous Q12H  . torsemide  40 mg Oral Q0600  . vitamin C  500 mg Oral Daily  . zinc sulfate  220 mg Oral Daily   Continuous Infusions: . sodium chloride    . sodium chloride    . cefTRIAXone (ROCEPHIN)  IV 1 g (04/13/19 2329)     LOS: 2 days    Time spent: 25 minutes spent in the coordination of care  today.     Jonnie Finner, DO [facility] Pager [phone]  If 7PM-7AM, please contact night-coverage [email or web address] Password [facility] 04/14/2019, 4:19 PM

## 2019-04-14 NOTE — Progress Notes (Signed)
[facility] ROUNDING NOTE   Subjective:   Summary this is a 78 year old gentleman with hypertension hyperlipidemia history of coronary artery disease status post CABG Monday Wednesday Friday end-stage renal disease.  He is also diabetic history of CVA and gastroesophageal reflux disease.  He presents [facility] if he is cough chills for 2 weeks.  He was COVID positive.  And is requiring oxygen supplementation to prevent hypoxic respiratory failure.  He is scheduled for dialysis 04/14/2019  Medications Rocephin 1 g every 24 hours, amlodipine 10 mg daily atorvastatin 40 mg a day Coreg to 6.5 mg twice daily clonidine 0.1 mg 3 times daily torsemide 40 mg daily insulin 10 units subcu Levemir and sliding scale, Protonix 40 mg twice daily, sodium bicarbonate 650 mg twice daily, Plavix 75 mg daily.  Sodium 136 potassium 3.6 chloride 95 CO2 26 BUN 56 creatinine 6.28 glucose 171 phosphorus 3.8 magnesium 1.3 AST 23 ALT 21 albumin 2.8.  Calcium 6.9   WBC 3.8 hemoglobin 10.9 platelets 164  Chest x-ray revealed cardiomegaly vascular congestion  2D echo EF 55 to 60% 10/18/2018   Blood pressure 135/66 pulse 85 temperature 99.5 O2 sats 9089% 2 L nasal cannula  Urine output 400 cc 04/13/2019 weight 113 kg  Objective:  Vital signs in last 24 hours:  Temp:  [98.7 F (37.1 C)-100.8 F (38.2 C)] 99.5 F (37.5 C) (05/18 0600) Pulse Rate:  [78-95] 92 (05/18 0600) Resp:  [22-32] 30 (05/18 0600) BP: (135-168)/(42-155) 135/66 (05/18 0600) SpO2:  [89 %-98 %] 96 % (05/18 0600) Weight:  [528 kg] 113 kg (05/18 0500)  Weight change: -4.9 kg Filed Weights   04/12/19 1000 04/14/19 0500  Weight: 117.9 kg 113 kg    Intake/Output: I/O last 3 completed shifts: In: 54 [P.O.:120; Other:250; IV Piggyback:200] Out: 600 [Urine:600]   Intake/Output this shift:  No intake/output data recorded.  Physical examination deferred in light of COVID-19 medication strategies discussed with RN  in detail trace edema normal work of breathing.  Dialyzes [place] [phone]  MWF, BFR400 DFR600 AVF 4 hours EDW 116kg 7O/5DG   Basic Metabolic Panel: Recent Labs  Lab 04/11/19 1936 04/12/19 1513 04/13/19 0629 04/14/19 0805  NA 139 138 137 136  K 3.6 3.4* 3.6 3.6  CL 97* 94* 94* 95*  CO2 29 29 26 26   GLUCOSE 178* 255* 190* 171*  BUN 20 27* 36* 56*  CREATININE 3.09* 4.23* 4.84* 6.28*  CALCIUM 8.4* 7.9* 7.6* 6.9*  MG  --   --  1.3* 1.3*  PHOS  --   --  3.5 3.8    Liver Function Tests: Recent Labs  Lab 04/11/19 1936 04/12/19 1513 04/13/19 0629 04/14/19 0805  AST 29 24 23 23   ALT 30 27 21 21   ALKPHOS 150* 130* 120 113  BILITOT 1.2 1.0 1.0 1.1  PROT 7.9 7.0 6.5 6.2*  ALBUMIN 3.9 3.4* 3.1* 2.8*   No results for input(s): LIPASE, AMYLASE in the last 168 hours. No results for input(s): AMMONIA in the last 168 hours.  CBC: Recent Labs  Lab 04/11/19 1936 04/12/19 1513 04/13/19 0629 04/14/19 0805  WBC 5.9 4.7 4.5 3.8*  NEUTROABS 4.4 3.6 3.1 2.3  HGB 12.0* 12.0* 11.4* 10.9*  HCT 37.9* 37.6* 35.9* 34.2*  MCV 101.6* 100.8* 101.4* 99.4  PLT 166 160 166 164    Cardiac Enzymes: Recent Labs  Lab 04/12/19 1513 04/13/19 0629 04/14/19 0805  CKTOTAL  --  81 86  TROPONINI 0.26*  --   --  BNP: Invalid input(s): POCBNP  CBG: Recent Labs  Lab 04/13/19 1006 04/13/19 1403 04/13/19 1812 04/13/19 2203 04/14/19 0906  GLUCAP 149* 212* 160* 147* 171*    Microbiology: Results for orders placed or performed during the hospital encounter of 04/11/19  Culture, blood (Routine x 2)     Status: None (Preliminary result)   Collection Time: 04/11/19  7:36 PM  Result Value Ref Range Status   Specimen Description BLOOD LEFT ANTECUBITAL  Final   Special Requests   Final    BOTTLES DRAWN AEROBIC AND ANAEROBIC Blood Culture results may not be optimal due to an excessive volume of blood received in culture bottles   Culture   Final    NO GROWTH 3  DAYS Performed at [facility], [address], [place], [place] [address]    Report Status PENDING  Incomplete  SARS Coronavirus 2 (CEPHEID- Performed in [place] hospital lab), Hosp Order     Status: Abnormal   Collection Time: 04/11/19  7:36 PM  Result Value Ref Range Status   SARS Coronavirus 2 POSITIVE (A) NEGATIVE Final    Comment: RESULT CALLED TO, READ BACK BY AND VERIFIED WITH: CALLED TO [place] @2121  04/11/2019 [facility] (NOTE) If result is NEGATIVE SARS-CoV-2 target nucleic acids are NOT DETECTED. The SARS-CoV-2 RNA is generally detectable in upper and lower  respiratory specimens during the acute phase of infection. The lowest  concentration of SARS-CoV-2 viral copies this assay can detect is 250  copies / mL. A negative result does not preclude SARS-CoV-2 infection  and should not be used as the sole basis for treatment or other  patient management decisions.  A negative result may occur with  improper specimen collection / handling, submission of specimen other  than nasopharyngeal swab, presence of viral mutation(s) within the  areas targeted by this assay, and inadequate number of viral copies  (<250 copies / mL). A negative result must be combined with clinical  observations, patient history, and epidemiological information. If result is POSITIVE SARS-CoV-2 target nucleic acids are DETEC TED. The SARS-CoV-2 RNA is generally detectable in upper and lower  respiratory specimens during the acute phase of infection.  Positive  results are indicative of active infection with SARS-CoV-2.  Clinical  correlation with patient history and other diagnostic information is  necessary to determine patient infection status.  Positive results do  not rule out bacterial infection or co-infection with other viruses. If result is PRESUMPTIVE POSTIVE SARS-CoV-2 nucleic acids MAY BE PRESENT.   A presumptive positive result was obtained on the submitted specimen  and  confirmed on repeat testing.  While 2019 novel coronavirus  (SARS-CoV-2) nucleic acids may be present in the submitted sample  additional confirmatory testing may be necessary for epidemiological  and / or clinical management purposes  to differentiate between  SARS-CoV-2 and other Sarbecovirus currently known to infect humans.  If clinically indicated additional testing with an alternate test  methodology (LAB7 453) is advised. The SARS-CoV-2 RNA is generally  detectable in upper and lower respiratory specimens during the acute  phase of infection. The expected result is Negative. Fact Sheet for Patients:  StrictlyIdeas.no Fact Sheet for Healthcare Providers: BankingDealers.co.za This test is not yet approved or cleared by the [place] FDA and has been authorized for detection and/or diagnosis of SARS-CoV-2 by FDA under an Emergency Use Authorization (EUA).  This EUA will remain in effect (meaning this test can be used) for the duration of the COVID-19 declaration under Section  564(b)(1) of the Act, 21 U.S.C. section 360bbb-3(b)(1), unless the authorization is terminated or revoked sooner. Performed at [facility], [address] [place], [place] [address]   Culture, blood (Routine x 2)     Status: None (Preliminary result)   Collection Time: 04/11/19  7:38 PM  Result Value Ref Range Status   Specimen Description BLOOD LEFT HAND  Final   Special Requests   Final    BOTTLES DRAWN AEROBIC AND ANAEROBIC Blood Culture adequate volume   Culture   Final    NO GROWTH 3 DAYS Performed at [facility], [address], [place], [place] [address]    Report Status PENDING  Incomplete    Coagulation Studies: Recent Labs    04/11/19 1936  LABPROT 12.8  INR 1.0    Urinalysis: Recent Labs    04/11/19 1956  COLORURINE YELLOW*  LABSPEC 1.016  PHURINE 7.0  GLUCOSEU NEGATIVE  HGBUR NEGATIVE  BILIRUBINUR  NEGATIVE  KETONESUR NEGATIVE  PROTEINUR 100*  NITRITE NEGATIVE  LEUKOCYTESUR LARGE*      Imaging: No results found.   Medications:   . cefTRIAXone (ROCEPHIN)  IV 1 g (04/13/19 2329)   . amLODipine  10 mg Oral Daily  . atorvastatin  40 mg Oral QHS  . carvedilol  6.25 mg Oral BID WC  . Chlorhexidine Gluconate Cloth  6 each Topical Q0600  . cloNIDine  0.1 mg Oral TID  . clopidogrel  75 mg Oral Daily  . docusate sodium  100 mg Oral BID  . dorzolamide-timolol  1 drop Both Eyes BID  . heparin  5,000 Units Subcutaneous Q8H  . insulin aspart  0-15 Units Subcutaneous TID WC  . insulin detemir  10 Units Subcutaneous QHS  . Ipratropium-Albuterol  1 puff Inhalation Q6H  . pantoprazole  40 mg Oral BID  . sodium bicarbonate  650 mg Oral BID  . sodium chloride flush  3 mL Intravenous Q12H  . torsemide  40 mg Oral Q0600  . vitamin C  500 mg Oral Daily  . zinc sulfate  220 mg Oral Daily   acetaminophen, chlorpheniramine-HYDROcodone, guaiFENesin-dextromethorphan, hydrOXYzine, ondansetron **OR** ondansetron (ZOFRAN) IV, oxyCODONE-acetaminophen, zolpidem  Assessment/ Plan:   End-stage renal disease Monday Wednesday Friday dialysis will plan dialysis today some vascular congestion on chest x-ray increasing oxygen requirements probably secondary to sepsis from COVID-19 infection  Anemia no indication for ESA hemoglobin 10.9  Diabetes mellitus appears to be well controlled  Hypertension appears to be stable on current doses of antihypertensive medications he continues on Lasix 40 mg daily  Bones will will follow calcium phosphorus appear to be acceptable at this point  Pneumonia secondary to COVID-19   LOS: 2 Sherril Croon @TODAY @9 :59 AM

## 2019-04-15 ENCOUNTER — Encounter (HOSPITAL_COMMUNITY): Payer: Self-pay

## 2019-04-15 LAB — COMPREHENSIVE METABOLIC PANEL
ALT: 21 U/L (ref 0–44)
AST: 31 U/L (ref 15–41)
Albumin: 2.7 g/dL — ABNORMAL LOW (ref 3.5–5.0)
Alkaline Phosphatase: 119 U/L (ref 38–126)
Anion gap: 15 (ref 5–15)
BUN: 34 mg/dL — ABNORMAL HIGH (ref 8–23)
CO2: 27 mmol/L (ref 22–32)
Calcium: 7.4 mg/dL — ABNORMAL LOW (ref 8.9–10.3)
Chloride: 94 mmol/L — ABNORMAL LOW (ref 98–111)
Creatinine, Ser: 5.02 mg/dL — ABNORMAL HIGH (ref 0.61–1.24)
GFR calc Af Amer: 12 mL/min — ABNORMAL LOW (ref >60)
GFR calc non Af Amer: 10 mL/min — ABNORMAL LOW (ref >60)
Glucose, Bld: 215 mg/dL — ABNORMAL HIGH (ref 70–99)
Potassium: 4.1 mmol/L (ref 3.5–5.1)
Sodium: 136 mmol/L (ref 135–145)
Total Bilirubin: 0.9 mg/dL (ref 0.3–1.2)
Total Protein: 6.4 g/dL — ABNORMAL LOW (ref 6.5–8.1)

## 2019-04-15 LAB — GLUCOSE, CAPILLARY
Glucose-Capillary: 191 mg/dL — ABNORMAL HIGH (ref 70–99)
Glucose-Capillary: 196 mg/dL — ABNORMAL HIGH (ref 70–99)
Glucose-Capillary: 156 mg/dL — ABNORMAL HIGH (ref 70–99)
Glucose-Capillary: 126 mg/dL — ABNORMAL HIGH (ref 70–99)

## 2019-04-15 LAB — CBC WITH DIFFERENTIAL/PLATELET
Abs Immature Granulocytes: 0 10*3/uL (ref 0.00–0.07)
Basophils Absolute: 0 10*3/uL (ref 0.0–0.1)
Basophils Relative: 0 %
Eosinophils Absolute: 0 10*3/uL (ref 0.0–0.5)
Eosinophils Relative: 0 %
HCT: 34.1 % — ABNORMAL LOW (ref 39.0–52.0)
Hemoglobin: 10.8 g/dL — ABNORMAL LOW (ref 13.0–17.0)
Lymphocytes Relative: 18 %
Lymphs Abs: 0.7 10*3/uL (ref 0.7–4.0)
MCH: 31.8 pg (ref 26.0–34.0)
MCHC: 31.7 g/dL (ref 30.0–36.0)
MCV: 100.3 fL — ABNORMAL HIGH (ref 80.0–100.0)
Monocytes Absolute: 0.4 10*3/uL (ref 0.1–1.0)
Monocytes Relative: 9 %
Neutro Abs: 2.9 10*3/uL (ref 1.7–7.7)
Neutrophils Relative %: 73 %
Platelets: 174 10*3/uL (ref 150–400)
RBC: 3.4 MIL/uL — ABNORMAL LOW (ref 4.22–5.81)
RDW: 14.9 % (ref 11.5–15.5)
WBC: 4 10*3/uL (ref 4.0–10.5)
nRBC: 0 % (ref 0.0–0.2)
nRBC: 0 /100 WBC

## 2019-04-15 LAB — MAGNESIUM: Magnesium: 1.5 mg/dL — ABNORMAL LOW (ref 1.7–2.4)

## 2019-04-15 LAB — TRIGLYCERIDES: Triglycerides: 263 mg/dL — ABNORMAL HIGH (ref <150)

## 2019-04-15 LAB — PHOSPHORUS: Phosphorus: 3.2 mg/dL (ref 2.5–4.6)

## 2019-04-15 LAB — D-DIMER, QUANTITATIVE: D-Dimer, Quant: 0.8 ug/mL-FEU — ABNORMAL HIGH (ref 0.00–0.50)

## 2019-04-15 LAB — CK: Total CK: 98 U/L (ref 49–397)

## 2019-04-15 LAB — INTERLEUKIN-6, PLASMA: Interleukin-6, Plasma: 50.1 pg/mL — ABNORMAL HIGH (ref 0.0–12.2)

## 2019-04-15 LAB — FERRITIN: Ferritin: 1470 ng/mL — ABNORMAL HIGH (ref 24–336)

## 2019-04-15 LAB — C-REACTIVE PROTEIN: CRP: 11 mg/dL — ABNORMAL HIGH (ref <1.0)

## 2019-04-15 MED ORDER — AMOXICILLIN-POT CLAVULANATE 500-125 MG PO TABS
500.0000 mg | ORAL_TABLET | ORAL | Status: AC
  Administered 2019-04-15 – 2019-04-16 (×2): 500 mg via ORAL
  Filled 2019-04-15 (×2): qty 1

## 2019-04-15 MED ORDER — CHLORHEXIDINE GLUCONATE CLOTH 2 % EX PADS
6.0000 | MEDICATED_PAD | Freq: Every day | CUTANEOUS | Status: DC
  Administered 2019-04-16 – 2019-04-17 (×2): 6 via TOPICAL

## 2019-04-15 NOTE — Progress Notes (Signed)
[facility] ROUNDING NOTE   Subjective:   Summary this is a 78 year old gentleman with hypertension hyperlipidemia history of coronary artery disease status post CABG Monday Wednesday Friday end-stage renal disease.  He is also diabetic history of CVA and gastroesophageal reflux disease.  He presents [facility] if he is cough chills for 2 weeks.  He was COVID positive.  And is requiring oxygen supplementation to prevent hypoxic respiratory failure.  He underwent successful dialysis treatment 04/14/2019 with the ultrafiltration of 600 cc.  Weight stable 115.3 kg   Rocephin 1 g every 24 hours  amlodipine 10 mg daily atorvastatin 40 mg a day Coreg to 6.5 mg twice daily clonidine 0.1 mg 3 times daily,  daily insulin 10 units subcu Levemir and sliding scale, Protonix 40 mg twice daily, sodium bicarbonate 650 mg twice daily, Plavix 75 mg daily.,  Combivent inhaler 1 puff every 6 hours, torsemide 40 mg daily.  Blood pressure 110/51 pulse 56 temperature 99.3 O2 sats 98% room air  Sodium 136 potassium 4.1 chloride 94 CO2 27 BUN 34 creatinine 5.02 glucose 215 calcium 7.4 phosphorus 3.2 magnesium 1.5 albumin 2.7 CK 98 WBC 4.0 hemoglobin 10.8 platelets 174  2D echo EF 55 to 60% 10/18/2018   Objective:  Vital signs in last 24 hours:  Temp:  [99 F (37.2 C)-100.9 F (38.3 C)] 99.3 F (37.4 C) (05/19 0919) Pulse Rate:  [72-129] 80 (05/19 0843) Resp:  [13-32] 32 (05/19 0843) BP: (110-205)/(51-99) 110/51 (05/19 0919) SpO2:  [90 %-98 %] 93 % (05/19 0843) Weight:  [478 kg-115.3 kg] 115.3 kg (05/19 0607)  Weight change: 2.2 kg Filed Weights   04/14/19 1305 04/14/19 1730 04/15/19 0607  Weight: 115.2 kg 115 kg 115.3 kg    Intake/Output: I/O last 3 completed shifts: In: 566.5 [P.O.:240; IV Piggyback:326.5] Out: 700 [Urine:700]   Intake/Output this shift:  No intake/output data recorded.  Physical examination deferred in light of COVID-19 medication strategies  discussed with RN in detail trace edema normal work of breathing.  Dialyzes [place] [phone]  MWF, BFR400 VHQ469 AVF 4 hours EDW 116kg 6E/9BM   Basic Metabolic Panel: Recent Labs  Lab 04/11/19 1936 04/12/19 1513 04/13/19 0629 04/14/19 0805 04/15/19 0557  NA 139 138 137 136 136  K 3.6 3.4* 3.6 3.6 4.1  CL 97* 94* 94* 95* 94*  CO2 29 29 26 26 27   GLUCOSE 178* 255* 190* 171* 215*  BUN 20 27* 36* 56* 34*  CREATININE 3.09* 4.23* 4.84* 6.28* 5.02*  CALCIUM 8.4* 7.9* 7.6* 6.9* 7.4*  MG  --   --  1.3* 1.3* 1.5*  PHOS  --   --  3.5 3.8 3.2    Liver Function Tests: Recent Labs  Lab 04/11/19 1936 04/12/19 1513 04/13/19 0629 04/14/19 0805 04/15/19 0557  AST 29 24 23 23 31   ALT 30 27 21 21 21   ALKPHOS 150* 130* 120 113 119  BILITOT 1.2 1.0 1.0 1.1 0.9  PROT 7.9 7.0 6.5 6.2* 6.4*  ALBUMIN 3.9 3.4* 3.1* 2.8* 2.7*   No results for input(s): LIPASE, AMYLASE in the last 168 hours. No results for input(s): AMMONIA in the last 168 hours.  CBC: Recent Labs  Lab 04/11/19 1936 04/12/19 1513 04/13/19 0629 04/14/19 0805 04/14/19 1410 04/15/19 0557  WBC 5.9 4.7 4.5 3.8* 4.1 4.0  NEUTROABS 4.4 3.6 3.1 2.3  --  2.9  HGB 12.0* 12.0*  11.9* 11.4* 10.9* 11.2* 10.8*  HCT 37.9* 37.6* 35.9* 34.2* 34.4* 34.1*  MCV 101.6* 100.8*  101.4* 99.4 98.6 100.3*  PLT 166 160 166 164 180 174    Cardiac Enzymes: Recent Labs  Lab 04/12/19 1513 04/13/19 0629 04/14/19 0805 04/15/19 0557  CKTOTAL  --  81 86 98  TROPONINI 0.26*  --   --   --     BNP: Invalid input(s): POCBNP  CBG: Recent Labs  Lab 04/14/19 0906 04/14/19 1150 04/14/19 1757 04/14/19 2203 04/15/19 0808  GLUCAP 171* 202* 104* 266* 191*    Microbiology: Results for orders placed or performed during the hospital encounter of 04/11/19  Culture, blood (Routine x 2)     Status: None (Preliminary result)   Collection Time: 04/11/19  7:36 PM  Result Value Ref Range Status   Specimen Description BLOOD LEFT  ANTECUBITAL  Final   Special Requests   Final    BOTTLES DRAWN AEROBIC AND ANAEROBIC Blood Culture results may not be optimal due to an excessive volume of blood received in culture bottles   Culture   Final    NO GROWTH 4 DAYS Performed at [facility], [address], [place], [place] [address]    Report Status PENDING  Incomplete  SARS Coronavirus 2 (CEPHEID- Performed in [facility] hospital lab), Hosp Order     Status: Abnormal   Collection Time: 04/11/19  7:36 PM  Result Value Ref Range Status   SARS Coronavirus 2 POSITIVE (A) NEGATIVE Final    Comment: RESULT CALLED TO, READ BACK BY AND VERIFIED WITH: CALLED TO [place] @2121  04/11/2019 [facility] (NOTE) If result is NEGATIVE SARS-CoV-2 target nucleic acids are NOT DETECTED. The SARS-CoV-2 RNA is generally detectable in upper and lower  respiratory specimens during the acute phase of infection. The lowest  concentration of SARS-CoV-2 viral copies this assay can detect is 250  copies / mL. A negative result does not preclude SARS-CoV-2 infection  and should not be used as the sole basis for treatment or other  patient management decisions.  A negative result may occur with  improper specimen collection / handling, submission of specimen other  than nasopharyngeal swab, presence of viral mutation(s) within the  areas targeted by this assay, and inadequate number of viral copies  (<250 copies / mL). A negative result must be combined with clinical  observations, patient history, and epidemiological information. If result is POSITIVE SARS-CoV-2 target nucleic acids are DETEC TED. The SARS-CoV-2 RNA is generally detectable in upper and lower  respiratory specimens during the acute phase of infection.  Positive  results are indicative of active infection with SARS-CoV-2.  Clinical  correlation with patient history and other diagnostic information is  necessary to determine patient infection status.  Positive results do  not  rule out bacterial infection or co-infection with other viruses. If result is PRESUMPTIVE POSTIVE SARS-CoV-2 nucleic acids MAY BE PRESENT.   A presumptive positive result was obtained on the submitted specimen  and confirmed on repeat testing.  While 2019 novel coronavirus  (SARS-CoV-2) nucleic acids may be present in the submitted sample  additional confirmatory testing may be necessary for epidemiological  and / or clinical management purposes  to differentiate between  SARS-CoV-2 and other Sarbecovirus currently known to infect humans.  If clinically indicated additional testing with an alternate test  methodology (LAB7 453) is advised. The SARS-CoV-2 RNA is generally  detectable in upper and lower respiratory specimens during the acute  phase of infection. The expected result is Negative. Fact Sheet for Patients:  StrictlyIdeas.no Fact Sheet for Healthcare Providers: BankingDealers.co.za This  test is not yet approved or cleared by the [place] and has been authorized for detection and/or diagnosis of SARS-CoV-2 by FDA under an Emergency Use Authorization (EUA).  This EUA will remain in effect (meaning this test can be used) for the duration of the COVID-19 declaration under Section 564(b)(1) of the Act, 21 U.S.C. section 360bbb-3(b)(1), unless the authorization is terminated or revoked sooner. Performed at [facility], [address] [place], [place] [address]   Culture, blood (Routine x 2)     Status: None (Preliminary result)   Collection Time: 04/11/19  7:38 PM  Result Value Ref Range Status   Specimen Description BLOOD LEFT HAND  Final   Special Requests   Final    BOTTLES DRAWN AEROBIC AND ANAEROBIC Blood Culture adequate volume   Culture   Final    NO GROWTH 4 DAYS Performed at [facility], [address] [place], [place] [address]    Report Status PENDING  Incomplete    Coagulation  Studies: No results for input(s): LABPROT, INR in the last 72 hours.  Urinalysis: No results for input(s): COLORURINE, LABSPEC, PHURINE, GLUCOSEU, HGBUR, BILIRUBINUR, KETONESUR, PROTEINUR, UROBILINOGEN, NITRITE, LEUKOCYTESUR in the last 72 hours.  Invalid input(s): APPERANCEUR    Imaging: No results found.   Medications:   . sodium chloride    . sodium chloride    . cefTRIAXone (ROCEPHIN)  IV Stopped (04/14/19 2331)   . amLODipine  10 mg Oral Daily  . atorvastatin  40 mg Oral QHS  . carvedilol  6.25 mg Oral BID WC  . Chlorhexidine Gluconate Cloth  6 each Topical Q0600  . cloNIDine  0.1 mg Oral TID  . clopidogrel  75 mg Oral Daily  . docusate sodium  100 mg Oral BID  . dorzolamide-timolol  1 drop Both Eyes BID  . heparin  5,000 Units Subcutaneous Q8H  . insulin aspart  0-15 Units Subcutaneous TID WC  . insulin detemir  10 Units Subcutaneous QHS  . Ipratropium-Albuterol  1 puff Inhalation Q6H  . pantoprazole  40 mg Oral BID  . sodium bicarbonate  650 mg Oral BID  . sodium chloride flush  3 mL Intravenous Q12H  . torsemide  40 mg Oral Q0600  . vitamin C  500 mg Oral Daily  . zinc sulfate  220 mg Oral Daily   sodium chloride, sodium chloride, acetaminophen, alteplase, chlorpheniramine-HYDROcodone, guaiFENesin-dextromethorphan, heparin, heparin, hydrOXYzine, lidocaine (PF), lidocaine-prilocaine, ondansetron **OR** ondansetron (ZOFRAN) IV, oxyCODONE-acetaminophen, pentafluoroprop-tetrafluoroeth, zolpidem  Assessment/ Plan:   End-stage renal disease Monday Wednesday Friday tolerated dialysis 04/14/2019 next dialysis treatment will be planned for 04/16/2019 probably secondary to sepsis from COVID-19 infection  Anemia no indication for ESA hemoglobin 10.9  Diabetes mellitus appears to be well controlled  Hypertension appears to be stable on current doses of antihypertensive medications I believe we can discontinue his torsemide 40 mg daily  Metabolic acidosis seems to be  resolved will discontinue sodium bicarbonate  Bones will will follow calcium phosphorus appear to be acceptable at this point  Pneumonia secondary to COVID-19   LOS: [place] @TODAY @9 :56 AM

## 2019-04-15 NOTE — Progress Notes (Signed)
Pharmacy is unable to confirm the medications the patient was taking at home. All options have been exhausted and a resolution to the situation is not expected.   Patient is on droplet precautions so pharmacy is not allowed to enter, in order to conserve PPE. He is HOH so confirming medications by phone was not successful. His sister doesn't know his meds and reports he manages them himself and lives alone. We contacted his pharmacy and added the last time prescriptions were filled and that information has been added to each medication in an Order Note (highlighted yellow below the medication).  Please contact pharmacy if further assistance is needed.   Romeo Rabon, PharmD. Mobile: [phone]. 04/15/2019,9:44 AM.

## 2019-04-15 NOTE — Care Management Important Message (Signed)
Important Message  Patient Details  Name: Cory Miller MRN: [id number] Date of Birth: Aug 30, 1941   Medicare Important Message Given:  Yes  Pt is hard of hearing and COVID-19+. CSW unable to speak with patient over the phone as pt is unable to hear on the phone. CSW spoke with pt's sister [name] and she is agreeable to verbal signature on the pt's behalf. Letter was given to RN to place at bedside-per pt's sister's request.  Eileen Stanford, LCSW 04/15/2019, 4:17 PM

## 2019-04-15 NOTE — Progress Notes (Signed)
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 Reports having fevers up to 101 F at home. Patient is not normally on oxygen, but found himself getting easily winded even with minimal exertion. Associated symptoms included sore throat, nasal congestion, diarrhea earlier in the week, and intermittent chest pain. He had continued to go to hemodialysis sessions and had a near complete dialysis session on 5/15. However, was sent to the emergency department for further evaluation due to symptoms. On admission patient was noted to blood pressures up to 191/71, pulse 107, respirations24, O2 saturations 94% on 2 L nasal cannula oxygen. Lab work revealed WBC 5.9, potassium 3.6, CO2 29, BUN 20, creatinine 3.09, and. Lactic acid 1.4. Chest x-ray showed chronic cardiomegaly with basilar congestion without any focal airspace opacity noted. COVID-19 testing was positive. Transfer was requested for need of hemodialysis and COVID-19 testing. Patient was accepted to a progressive bed here at [facility] for need of hemodialysis.   Assessment & Plan:   Principal Problem:   COVID-19 virus infection Active Problems:   Diabetes ([place])   HTN (hypertension)   End stage renal disease (HCC)   Hypokalemia   Anemia of chronic disease   COVID 19 positive:     - presenting with fever, tachycardia; qualifies as sepsis  - Patient presents with complaints of fever, chills, shortness of breath, and generalized malaise.      - Chest x-ray noted cardiomegaly without any signs of any focal infiltrate or edema.  - Found to be  COVID-19 positive, but with limited oxygen requirements at this time. - Continuous pulse oximetry with nasal cannula oxygen as needed - Combivent inhaler - follow ferritin, CRP uptredning; continue to follow  ESRD on HD:      - Renal diet and carb modified with fluid restriction     - HD per nephrology  Possible UTI/asymptomatic bacteriuria:  - UA urine positive for large leukocytes with >50 WBCs. - UCx: pending - Rocephin IV; can change to augmentin for 2 more doses after HD  Second-degree heart block:  - New finding on EKG when compared to previous tracings. - Continue to monitor - Consult cardiology if needed  Essential hypertension:  - Blood pressure seen to be elevated up to 191/71. - Continue Norvasc, Coreg, clonidine, torsemide and hydralazine.  Diabetes mellitus type 2:  - Last hemoglobin A1c noted to be 6.9 in 09/2018. - Hypoglycemic protocol - Continue home Levemir 10 units at bedtime - CBGs with moderate sliding scale insulin  Anemia of chronic disease:  - Hemoglobin appears stable at 12 - Continue to monitor  Hypokalemia:  - Acute.  - Recheck potassium noted to be 3.4 after blood work obtained. - resolved had a nearly complete dialysis session on 5/16. - Renal diet and carb modified with fluid restriction - HD per nephrology  Deconditioning     - PT/OT consults  Febrile ON and remains tachypnic. Can transition to Augmentin after HD for 2 doses. PT/OT to see.   DVT prophylaxis: heparin Code Status: FULL   Disposition Plan: TBD   Consultants:   Nephrology   Antimicrobials:  . Rocephin    Subjective: "Is it bad,  doc?"  Objective: Vitals:   04/15/19 0130 04/15/19 0300 04/15/19 0414 04/15/19 0607  BP:   (!) 111/56   Pulse:  80 81   Resp:  13 (!) 28   Temp:   99.7 F (37.6 C)   TempSrc:   Oral   SpO2: 95% 95% 97%   Weight:    115.3 kg  Height:         Intake/Output Summary (Last 24 hours) at 04/15/2019 0654 Last data filed at 04/15/2019 0145 Gross per 24 hour  Intake 566.45 ml  Output 500 ml  Net 66.45 ml   Filed Weights   04/14/19 1305 04/14/19 1730 04/15/19 5784  Weight: 115.2 kg 115 kg 115.3 kg    Examination:  General: 78 y.o. male resting in bed in NAD Cardiovascular: RRR, +S1, S2, no m/g/r, equal pulses throughout, RUE fistula Respiratory: decreased at bases, tachypnic, no accessory muscle use GI: BS+, NDNT, no masses noted, no organomegaly noted MSK: No e/c/c Skin: No rashes, bruises, ulcerations noted Neuro: A&O x 3, no focal deficits, very hard of hearing Psyc: Appropriate interaction and affect, calm/cooperative    Data Reviewed: I have personally reviewed following labs and imaging studies.  CBC: Recent Labs  Lab 04/11/19 1936 04/12/19 1513 04/13/19 0629 04/14/19 0805 04/14/19 1410 04/15/19 0557  WBC 5.9 4.7 4.5 3.8* 4.1 4.0  NEUTROABS 4.4 3.6 3.1 2.3  --  PENDING  HGB 12.0* 12.0*  11.9* 11.4* 10.9* 11.2* 10.8*  HCT 37.9* 37.6* 35.9* 34.2* 34.4* 34.1*  MCV 101.6* 100.8* 101.4* 99.4 98.6 100.3*  PLT 166 160 166 164 180 696   Basic Metabolic Panel: Recent Labs  Lab 04/11/19 1936 04/12/19 1513 04/13/19 0629 04/14/19 0805 04/15/19 0557  NA 139 138 137 136 136  K 3.6 3.4* 3.6 3.6 4.1  CL 97* 94* 94* 95* 94*  CO2 29 29 26 26 27   GLUCOSE 178* 255* 190* 171* 215*  BUN 20 27* 36* 56* 34*  CREATININE 3.09* 4.23* 4.84* 6.28* 5.02*  CALCIUM 8.4* 7.9* 7.6* 6.9* 7.4*  MG  --   --  1.3* 1.3* 1.5*  PHOS  --   --  3.5 3.8 3.2   GFR: Estimated Creatinine Clearance: 16.4 mL/min (A) (by C-G formula based on SCr of 5.02 mg/dL (H)). Liver Function Tests: Recent Labs  Lab 04/11/19 1936 04/12/19 1513 04/13/19 0629 04/14/19 0805 04/15/19 0557  AST 29 24 23 23 31   ALT 30 27 21 21 21   ALKPHOS 150* 130* 120 113 119  BILITOT 1.2 1.0 1.0 1.1 0.9  PROT 7.9 7.0 6.5 6.2* 6.4*  ALBUMIN 3.9 3.4* 3.1*  2.8* 2.7*   No results for input(s): LIPASE, AMYLASE in the last 168 hours. No results for input(s): AMMONIA in the last 168 hours. Coagulation Profile: Recent Labs  Lab 04/11/19 1936  INR 1.0   Cardiac Enzymes: Recent Labs  Lab 04/12/19 1513 04/13/19 0629 04/14/19 0805 04/15/19 0557  CKTOTAL  --  81 86 98  TROPONINI 0.26*  --   --   --    BNP (last 3 results) No results for input(s): PROBNP in the last 8760 hours. HbA1C: No results for input(s): HGBA1C in the last 72 hours. CBG: Recent Labs  Lab 04/13/19 2203 04/14/19 0906 04/14/19 1150 04/14/19 1757 04/14/19 2203  GLUCAP 147* 171* 202* 104* 266*   Lipid Profile: Recent Labs    04/14/19 0805 04/15/19 0557  TRIG 182* 263*   Thyroid Function Tests: No results for input(s): TSH, T4TOTAL, FREET4, T3FREE,  THYROIDAB in the last 72 hours. Anemia Panel: Recent Labs    04/13/19 0629 04/14/19 0805  FERRITIN 985* 1,146*   Sepsis Labs: Recent Labs  Lab 04/11/19 1936 04/12/19 1513  PROCALCITON  --  0.90  LATICACIDVEN 1.4  --     Recent Results (from the past 240 hour(s))  Culture, blood (Routine x 2)     Status: None (Preliminary result)   Collection Time: 04/11/19  7:36 PM  Result Value Ref Range Status   Specimen Description BLOOD LEFT ANTECUBITAL  Final   Special Requests   Final    BOTTLES DRAWN AEROBIC AND ANAEROBIC Blood Culture results may not be optimal due to an excessive volume of blood received in culture bottles   Culture   Final    NO GROWTH 3 DAYS Performed at [facility], [address], [place], [place] [address]    Report Status PENDING  Incomplete  SARS Coronavirus 2 (CEPHEID- Performed in [place] hospital lab), Hosp Order     Status: Abnormal   Collection Time: 04/11/19  7:36 PM  Result Value Ref Range Status   SARS Coronavirus 2 POSITIVE (A) NEGATIVE Final    Comment: RESULT CALLED TO, READ BACK BY AND VERIFIED WITH: CALLED TO [place] @2121  04/11/2019 [facility] (NOTE)  If result is NEGATIVE SARS-CoV-2 target nucleic acids are NOT DETECTED. The SARS-CoV-2 RNA is generally detectable in upper and lower  respiratory specimens during the acute phase of infection. The lowest  concentration of SARS-CoV-2 viral copies this assay can detect is 250  copies / mL. A negative result does not preclude SARS-CoV-2 infection  and should not be used as the sole basis for treatment or other  patient management decisions.  A negative result may occur with  improper specimen collection / handling, submission of specimen other  than nasopharyngeal swab, presence of viral mutation(s) within the  areas targeted by this assay, and inadequate number of viral copies  (<250 copies / mL). A negative result must be combined with clinical  observations, patient history, and epidemiological information. If result is POSITIVE SARS-CoV-2 target nucleic acids are DETEC TED. The SARS-CoV-2 RNA is generally detectable in upper and lower  respiratory specimens during the acute phase of infection.  Positive  results are indicative of active infection with SARS-CoV-2.  Clinical  correlation with patient history and other diagnostic information is  necessary to determine patient infection status.  Positive results do  not rule out bacterial infection or co-infection with other viruses. If result is PRESUMPTIVE POSTIVE SARS-CoV-2 nucleic acids MAY BE PRESENT.   A presumptive positive result was obtained on the submitted specimen  and confirmed on repeat testing.  While 2019 novel coronavirus  (SARS-CoV-2) nucleic acids may be present in the submitted sample  additional confirmatory testing may be necessary for epidemiological  and / or clinical management purposes  to differentiate between  SARS-CoV-2 and other Sarbecovirus currently known to infect humans.  If clinically indicated additional testing with an alternate test  methodology (LAB7 453) is advised. The SARS-CoV-2 RNA is  generally  detectable in upper and lower respiratory specimens during the acute  phase of infection. The expected result is Negative. Fact Sheet for Patients:  StrictlyIdeas.no Fact Sheet for Healthcare Providers: BankingDealers.co.za This test is not yet approved or cleared by the [place] FDA and has been authorized for detection and/or diagnosis of SARS-CoV-2 by FDA under an Emergency Use Authorization (EUA).  This EUA will remain in effect (meaning this test  can be used) for the duration of the COVID-19 declaration under Section 564(b)(1) of the Act, 21 U.S.C. section 360bbb-3(b)(1), unless the authorization is terminated or revoked sooner. Performed at [facility], [address] [place], [place] [address]   Culture, blood (Routine x 2)     Status: None (Preliminary result)   Collection Time: 04/11/19  7:38 PM  Result Value Ref Range Status   Specimen Description BLOOD LEFT HAND  Final   Special Requests   Final    BOTTLES DRAWN AEROBIC AND ANAEROBIC Blood Culture adequate volume   Culture   Final    NO GROWTH 3 DAYS Performed at [facility], [address] [place], [place] [address]    Report Status PENDING  Incomplete         Radiology Studies: No results found.      Scheduled Meds: . amLODipine  10 mg Oral Daily  . atorvastatin  40 mg Oral QHS  . carvedilol  6.25 mg Oral BID WC  . Chlorhexidine Gluconate Cloth  6 each Topical Q0600  . cloNIDine  0.1 mg Oral TID  . clopidogrel  75 mg Oral Daily  . docusate sodium  100 mg Oral BID  . dorzolamide-timolol  1 drop Both Eyes BID  . heparin  5,000 Units Subcutaneous Q8H  . insulin aspart  0-15 Units Subcutaneous TID WC  . insulin detemir  10 Units Subcutaneous QHS  . Ipratropium-Albuterol  1 puff Inhalation Q6H  . pantoprazole  40 mg Oral BID  . sodium bicarbonate  650 mg Oral BID  . sodium chloride flush  3 mL Intravenous Q12H  .  torsemide  40 mg Oral Q0600  . vitamin C  500 mg Oral Daily  . zinc sulfate  220 mg Oral Daily   Continuous Infusions: . sodium chloride    . sodium chloride    . cefTRIAXone (ROCEPHIN)  IV Stopped (04/14/19 2331)     LOS: 3 days    Time spent: 35 minutes spent in the coordination of care today.    Jonnie Finner, DO [facility] Pager [phone]  If 7PM-7AM, please contact night-coverage [email or web address] Password [facility] 04/15/2019, 6:54 AM

## 2019-04-16 LAB — COMPREHENSIVE METABOLIC PANEL
ALT: 20 U/L (ref 0–44)
AST: 33 U/L (ref 15–41)
Albumin: 2.5 g/dL — ABNORMAL LOW (ref 3.5–5.0)
Alkaline Phosphatase: 120 U/L (ref 38–126)
Anion gap: 15 (ref 5–15)
BUN: 53 mg/dL — ABNORMAL HIGH (ref 8–23)
CO2: 26 mmol/L (ref 22–32)
Calcium: 7.2 mg/dL — ABNORMAL LOW (ref 8.9–10.3)
Chloride: 94 mmol/L — ABNORMAL LOW (ref 98–111)
Creatinine, Ser: 7.22 mg/dL — ABNORMAL HIGH (ref 0.61–1.24)
GFR calc Af Amer: 8 mL/min — ABNORMAL LOW (ref >60)
GFR calc non Af Amer: 7 mL/min — ABNORMAL LOW (ref >60)
Glucose, Bld: 261 mg/dL — ABNORMAL HIGH (ref 70–99)
Potassium: 3.7 mmol/L (ref 3.5–5.1)
Sodium: 135 mmol/L (ref 135–145)
Total Bilirubin: 0.6 mg/dL (ref 0.3–1.2)
Total Protein: 5.9 g/dL — ABNORMAL LOW (ref 6.5–8.1)

## 2019-04-16 LAB — CBC WITH DIFFERENTIAL/PLATELET
Abs Immature Granulocytes: 0 10*3/uL (ref 0.00–0.07)
Basophils Absolute: 0 10*3/uL (ref 0.0–0.1)
Basophils Relative: 0 %
Eosinophils Absolute: 0 10*3/uL (ref 0.0–0.5)
Eosinophils Relative: 1 %
HCT: 33.4 % — ABNORMAL LOW (ref 39.0–52.0)
Hemoglobin: 10.7 g/dL — ABNORMAL LOW (ref 13.0–17.0)
Lymphocytes Relative: 14 %
Lymphs Abs: 0.5 10*3/uL — ABNORMAL LOW (ref 0.7–4.0)
MCH: 31.9 pg (ref 26.0–34.0)
MCHC: 32 g/dL (ref 30.0–36.0)
MCV: 99.7 fL (ref 80.0–100.0)
Monocytes Absolute: 0.4 10*3/uL (ref 0.1–1.0)
Monocytes Relative: 11 %
Neutro Abs: 2.9 10*3/uL (ref 1.7–7.7)
Neutrophils Relative %: 74 %
Platelets: 179 10*3/uL (ref 150–400)
RBC: 3.35 MIL/uL — ABNORMAL LOW (ref 4.22–5.81)
RDW: 14.9 % (ref 11.5–15.5)
WBC: 3.9 10*3/uL — ABNORMAL LOW (ref 4.0–10.5)
nRBC: 0 % (ref 0.0–0.2)
nRBC: 0 /100 WBC

## 2019-04-16 LAB — URINE CULTURE: Culture: NO GROWTH

## 2019-04-16 LAB — CULTURE, BLOOD (ROUTINE X 2)
Culture: NO GROWTH
Culture: NO GROWTH
Special Requests: ADEQUATE

## 2019-04-16 LAB — INTERLEUKIN-6, PLASMA: Interleukin-6, Plasma: 53.1 pg/mL — ABNORMAL HIGH (ref 0.0–12.2)

## 2019-04-16 LAB — C-REACTIVE PROTEIN: CRP: 10.5 mg/dL — ABNORMAL HIGH (ref <1.0)

## 2019-04-16 LAB — GLUCOSE, CAPILLARY
Glucose-Capillary: 225 mg/dL — ABNORMAL HIGH (ref 70–99)
Glucose-Capillary: 176 mg/dL — ABNORMAL HIGH (ref 70–99)
Glucose-Capillary: 170 mg/dL — ABNORMAL HIGH (ref 70–99)
Glucose-Capillary: 105 mg/dL — ABNORMAL HIGH (ref 70–99)

## 2019-04-16 LAB — PHOSPHORUS: Phosphorus: 4.2 mg/dL (ref 2.5–4.6)

## 2019-04-16 LAB — MAGNESIUM: Magnesium: 1.5 mg/dL — ABNORMAL LOW (ref 1.7–2.4)

## 2019-04-16 LAB — FERRITIN: Ferritin: 1761 ng/mL — ABNORMAL HIGH (ref 24–336)

## 2019-04-16 LAB — TRIGLYCERIDES: Triglycerides: 240 mg/dL — ABNORMAL HIGH (ref <150)

## 2019-04-16 LAB — CK: Total CK: 75 U/L (ref 49–397)

## 2019-04-16 LAB — D-DIMER, QUANTITATIVE: D-Dimer, Quant: 0.73 ug/mL-FEU — ABNORMAL HIGH (ref 0.00–0.50)

## 2019-04-16 MED ORDER — ALUM & MAG HYDROXIDE-SIMETH 200-200-20 MG/5ML PO SUSP
30.0000 mL | Freq: Once | ORAL | Status: AC
  Administered 2019-04-16: 30 mL via ORAL
  Filled 2019-04-16: qty 30

## 2019-04-16 MED ORDER — BENZONATATE 100 MG PO CAPS
100.0000 mg | ORAL_CAPSULE | Freq: Three times a day (TID) | ORAL | Status: DC
  Administered 2019-04-16 – 2019-05-12 (×78): 100 mg via ORAL
  Filled 2019-04-16 (×77): qty 1

## 2019-04-16 NOTE — Evaluation (Signed)
Physical Therapy Evaluation Patient Details Name: Cory Miller MRN: [id number] DOB: October 13, 1941 Today's Date: 04/16/2019   History of Present Illness  Cory Miller is a 78 y.o. 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  Reports having fevers up to 101 F at home. found to be Covid 19 +  Clinical Impression   Pt admitted with above diagnosis. Pt currently with functional limitations due to the deficits listed below (see PT Problem List). Lives at home alone; PRN assist from nephew for home management; Presents with decr activity tolerance, DOE, and overall decr functional capacity; He must be completely independent to dc home (he very realistically indicated his family would be hesitant to come assist him); He also must have th efunctional capacity to manage after HD, which can be very taxing; At this point, I favor SNF for post-acute rehab to maximize independence and safety with mobility as most prudent;   Pt will benefit from skilled PT to increase their independence and safety with mobility to allow discharge to the venue listed below.       Follow Up Recommendations SNF;Other (comment)(Will monitor for progress; IF progresses VERY well, will updte to dc home with [facility] services)    Equipment Recommendations  Other (comment)(consider rollator)    Recommendations for Other Services OT consult(as ordered)     Precautions / Restrictions Precautions Precautions: Fall Precaution Comments: Fall risk low (especially with RW), but present      Mobility  Bed Mobility Overal bed mobility: Needs Assistance Bed Mobility: Supine to Sit     Supine to sit: Min guard     General bed mobility comments: [place] assist for safety and lines  Transfers Overall transfer level:  Needs assistance Equipment used: Straight cane;Rolling walker (2 wheeled) Transfers: Sit to/from Stand Sit to Stand: Min assist         General transfer comment: Min assist for safety and steadiness; a few tries before successfully standing from bed and recliner; dependent on UE push/support from assistive device  Ambulation/Gait Ambulation/Gait assistance: Min guard(with and without physical contact) Gait Distance (Feet): 30 Feet(back and forth in room) Assistive device: Straight cane;Rolling walker (2 wheeled) Gait Pattern/deviations: Step-through pattern     General Gait Details: first bout of amb to the sink with pt's own straight cane, significant desaturation on room air; then walked with RW in room to decr work of walking on 2 L supplemental O2; notable DOE, incr RR, and pt reports chest tightness  Stairs            Wheelchair Mobility    Modified Rankin (Stroke Patients Only)       Balance Overall balance assessment: Needs assistance   Sitting balance-Leahy Scale: Good       Standing balance-Leahy Scale: Fair                               Pertinent Vitals/Pain Pain Assessment: No/denies pain(but some chest tightness, and DOE with activity)    Home Living Family/patient expects to be discharged to:: Private residence Living Arrangements: Alone Available Help at Discharge: Family;Available PRN/intermittently Type of Home: House Home Access: Stairs to enter Entrance Stairs-Rails: None Entrance Stairs-Number of Steps: 3 Home Layout: One level Home Equipment: Walker - 2  wheels;Cane - single point;Shower seat      Prior Function Level of Independence: Independent with assistive device(s)         Comments: Per chart review, as of November 2019, Ind amb without an AD in the home, [facility] in the community, Ind with ADLs, uses chronic O2 at home, unsure of liters per min.       Hand Dominance   Dominant Hand: Right    Extremity/Trunk  Assessment   Upper Extremity Assessment Upper Extremity Assessment: Defer to OT evaluation    Lower Extremity Assessment Lower Extremity Assessment: Generalized weakness(a few attempts before successfully standing)       Communication      Cognition Arousal/Alertness: Awake/alert Behavior During Therapy: WFL for tasks assessed/performed Overall Cognitive Status: Difficult to assess                                        General Comments General comments (skin integrity, edema, etc.): BP 116/52. 77% on RA with activity; cues for focused, deep breathing;  99% on 2L.     Exercises     Assessment/Plan    PT Assessment Patient needs continued PT services  PT Problem List Decreased strength;Decreased activity tolerance;Decreased balance;Decreased mobility;Decreased coordination;Decreased knowledge of use of DME;Decreased safety awareness;Decreased knowledge of precautions;Cardiopulmonary status limiting activity       PT Treatment Interventions DME instruction;Gait training;Stair training;Functional mobility training;Therapeutic activities;Therapeutic exercise;Balance training;Patient/family education    PT Goals (Current goals can be found in the Care Plan section)  Acute Rehab PT Goals Patient Stated Goal: did not specifically state PT Goal Formulation: With patient Time For Goal Achievement: 04/16/19 Potential to Achieve Goals: Good    Frequency Min 3X/week   Barriers to discharge Decreased caregiver support Lives alone    Co-evaluation PT/OT/SLP Co-Evaluation/Treatment: Yes Reason for Co-Treatment: To address functional/ADL transfers PT goals addressed during session: Mobility/safety with mobility         AM-PAC PT "6 Clicks" Mobility  Outcome Measure Help needed turning from your back to your side while in a flat bed without using bedrails?: None Help needed moving from lying on your back to sitting on the side of a flat bed without using  bedrails?: A Little Help needed moving to and from a bed to a chair (including a wheelchair)?: A Little Help needed standing up from a chair using your arms (e.g., wheelchair or bedside chair)?: A Little Help needed to walk in hospital room?: A Little Help needed climbing 3-5 steps with a railing? : A Lot 6 Click Score: 18    End of Session Equipment Utilized During Treatment: Gait belt;Oxygen Activity Tolerance: Patient tolerated treatment well Patient left: in chair;with bed alarm set;with chair alarm set Nurse Communication: Mobility status;Other (comment)(Need for supplemental O2 with activity) PT Visit Diagnosis: Difficulty in walking, not elsewhere classified (R26.2);Other (comment)(decr functional capacity)    Time: 1130-1230 PT Time Calculation (min) (ACUTE ONLY): 60 min   Charges:   PT Evaluation $PT Eval Moderate Complexity: 1 Mod PT Treatments $Gait Training: 8-22 mins        Roney Marion, PT  Acute Rehabilitation Services Pager [phone] Office [phone]   [place] 04/16/2019, 3:06 PM

## 2019-04-16 NOTE — Progress Notes (Signed)
[facility] ROUNDING NOTE   Subjective:   Summary this is a 77 year old gentleman with hypertension hyperlipidemia history of coronary artery disease status post CABG Monday Wednesday Friday end-stage renal disease.  He is also diabetic history of CVA and gastroesophageal reflux disease.  He presents [facility] if he is cough chills for 2 weeks.  He was COVID positive.  And is requiring oxygen supplementation to prevent hypoxic respiratory failure.  He underwent successful dialysis treatment 04/14/2019 with the ultrafiltration of 600 cc.  Weight stable 115.3 kg.  Dialysis is planned for 04/16/2019  Augmentin 500/125 mg daily  amlodipine 10 mg daily atorvastatin 40 mg a day Coreg to 6.5 mg twice daily clonidine 0.1 mg 3 times daily,  daily insulin 10 units subcu Levemir and sliding scale, Protonix 40 mg twice daily , Plavix 75 mg daily.,  Combivent inhaler 1 puff every 6 hours.  Blood pressure 130/46 pulse 83 temperature 98.2 O2 sats 99% 2 L nasal cannula  Sodium 135 potassium 3.7 chloride 94 CO2 26 BUN 53 creatinine 7.22 glucose 261 calcium 7.2 albumin 2.5 magnesium 1.5 phosphorus 4.2 CK 75 triglycerides 240 WBCs at 3.9 hemoglobin 10.7 platelets 179  2D echo EF 55 to 60% 10/18/2018   Objective:  Vital signs in last 24 hours:  Temp:  [98 F (36.7 C)-100.6 F (38.1 C)] 98.2 F (36.8 C) (05/20 0800) Pulse Rate:  [80-87] 83 (05/20 0800) Resp:  [17-26] 26 (05/20 0800) BP: (97-131)/(46-53) 130/46 (05/20 0800) SpO2:  [92 %-99 %] 99 % (05/20 0800)  Weight change:  Filed Weights   04/14/19 1305 04/14/19 1730 04/15/19 0607  Weight: 115.2 kg 115 kg 115.3 kg    Intake/Output: I/O last 3 completed shifts: In: 569.5 [P.O.:240; I.V.:3; IV Piggyback:326.5] Out: 100 [Urine:100]   Intake/Output this shift:  No intake/output data recorded.  Physical examination deferred in light of COVID-19 medication strategies discussed with RN in detail trace edema normal work  of breathing.  D[place]zes [place] [phone]  MWF, [id number] [id number] AVF 4 hours EDW 116kg 6C/3JS   Basic Metabolic Panel: Recent Labs  Lab 04/12/19 1513 04/13/19 0629 04/14/19 0805 04/15/19 0557 04/16/19 0535  NA 138 137 136 136 135  K 3.4* 3.6 3.6 4.1 3.7  CL 94* 94* 95* 94* 94*  CO2 29 26 26 27 26   GLUCOSE 255* 190* 171* 215* 261*  BUN 27* 36* 56* 34* 53*  CREATININE 4.23* 4.84* 6.28* 5.02* 7.22*  CALCIUM 7.9* 7.6* 6.9* 7.4* 7.2*  MG  --  1.3* 1.3* 1.5* 1.5*  PHOS  --  3.5 3.8 3.2 4.2    Liver Function Tests: Recent Labs  Lab 04/12/19 1513 04/13/19 0629 04/14/19 0805 04/15/19 0557 04/16/19 0535  AST 24 23 23 31  33  ALT 27 21 21 21 20   ALKPHOS 130* 120 113 119 120  BILITOT 1.0 1.0 1.1 0.9 0.6  PROT 7.0 6.5 6.2* 6.4* 5.9*  ALBUMIN 3.4* 3.1* 2.8* 2.7* 2.5*   No results for input(s): LIPASE, AMYLASE in the last 168 hours. No results for input(s): AMMONIA in the last 168 hours.  CBC: Recent Labs  Lab 04/12/19 1513 04/13/19 0629 04/14/19 0805 04/14/19 1410 04/15/19 0557 04/16/19 0535  WBC 4.7 4.5 3.8* 4.1 4.0 3.9*  NEUTROABS 3.6 3.1 2.3  --  2.9 2.9  HGB 12.0*  11.9* 11.4* 10.9* 11.2* 10.8* 10.7*  HCT 37.6* 35.9* 34.2* 34.4* 34.1* 33.4*  MCV 100.8* 101.4* 99.4 98.6 100.3* 99.7  PLT 160 166 164 180 174 179  Cardiac Enzymes: Recent Labs  Lab 04/12/19 1513 04/13/19 0629 04/14/19 0805 04/15/19 0557 04/16/19 0535  CKTOTAL  --  81 86 98 75  TROPONINI 0.26*  --   --   --   --     BNP: Invalid input(s): POCBNP  CBG: Recent Labs  Lab 04/15/19 0808 04/15/19 1153 04/15/19 1547 04/15/19 2150 04/16/19 0734  GLUCAP 191* 196* 156* 126* 225*    Microbiology: Results for orders placed or performed during the hospital encounter of 04/12/19  Urine Culture     Status: None   Collection Time: 04/15/19  1:47 AM  Result Value Ref Range Status   Specimen Description URINE, CLEAN CATCH  Final   Special Requests NONE  Final   Culture   Final     NO GROWTH Performed at [facility], [place] [address] [place], [place] [address]    Report Status 04/16/2019 FINAL  Final    Coagulation Studies: No results for input(s): LABPROT, INR in the last 72 hours.  Urinalysis: No results for input(s): COLORURINE, LABSPEC, PHURINE, GLUCOSEU, HGBUR, BILIRUBINUR, KETONESUR, PROTEINUR, UROBILINOGEN, NITRITE, LEUKOCYTESUR in the last 72 hours.  Invalid input(s): APPERANCEUR    Imaging: No results found.   Medications:   . sodium chloride    . sodium chloride     . amLODipine  10 mg Oral Daily  . amoxicillin-clavulanate  500 mg Oral Q24H  . atorvastatin  40 mg Oral QHS  . carvedilol  6.25 mg Oral BID WC  . Chlorhexidine Gluconate Cloth  6 each Topical Q0600  . Chlorhexidine Gluconate Cloth  6 each Topical Q0600  . cloNIDine  0.1 mg Oral TID  . clopidogrel  75 mg Oral Daily  . docusate sodium  100 mg Oral BID  . dorzolamide-timolol  1 drop Both Eyes BID  . heparin  5,000 Units Subcutaneous Q8H  . insulin aspart  0-15 Units Subcutaneous TID WC  . insulin detemir  10 Units Subcutaneous QHS  . Ipratropium-Albuterol  1 puff Inhalation Q6H  . pantoprazole  40 mg Oral BID  . sodium chloride flush  3 mL Intravenous Q12H  . vitamin C  500 mg Oral Daily  . zinc sulfate  220 mg Oral Daily   sodium chloride, sodium chloride, acetaminophen, alteplase, chlorpheniramine-HYDROcodone, guaiFENesin-dextromethorphan, heparin, heparin, hydrOXYzine, lidocaine (PF), lidocaine-prilocaine, ondansetron **OR** ondansetron (ZOFRAN) IV, oxyCODONE-acetaminophen, pentafluoroprop-tetrafluoroeth, zolpidem  Assessment/ Plan:   End-stage renal disease Monday Wednesday Friday tolerated dialysis 04/14/2019 next dialysis treatment will be planned for 04/16/2019 probably secondary to sepsis from COVID-19 infection.  Patient seen and evaluated during dialysis treatment 04/16/2019  Anemia no indication for ESA hemoglobin 10.9  Diabetes mellitus appears to be well  controlled  Hypertension appears to be stable on current doses of antihypertensive medications I   Metabolic acidosis resolved  Bones will will follow calcium phosphorus appear to be acceptable at this point  Pneumonia secondary to COVID-19   LOS: [place] @TODAY @9 :43 AM

## 2019-04-16 NOTE — Progress Notes (Addendum)
Occupational Therapy Evaluation Patient Details Name: Cory Miller MRN: [id number] DOB: Jun 13, 1941 Today's Date: 04/16/2019    History of present illness Cory Miller is a 78 y.o. male with medical history significant of HTN, HLD, CAD  s/p CABG, ESRD on HD(M/W/F), DM type II, CVA, GERD, and hard of hearing; who presented to [facility] with complaints of fever, chills, cough, and shortness of breath approximately 2 weeks.  Reports having fevers up to 101 F at home. found to be Covid 19 +   OT comments  PTA, pt was living alone and was independent with ADLs and IADLs. Pt currently requiring Min Guard A for LB ADLs and functional mobility with cane/RW. Pt presenting with decreased activity tolerance as seen by decreased SpO2 and SOB. Pt dropping to 77% on RA during simple grooming task. Placed pt on 2L O2 and he returned to >90% with cues for purse lip breathing. Pt motivated to participate in therapy. Due to decreased family support and activity tolerance, recommend dc to SNF for further OT for optimizing safety and independence with ADLs. Will continue to follow acutely as admitted.    Follow Up Recommendations  SNF;Supervision/Assistance - 24 hour ; May progress to home with [facility]   Equipment Recommendations  None recommended by OT    Recommendations for Other Services PT consult    Precautions / Restrictions Precautions Precautions: Fall Precaution Comments: Fall risk low (especially with RW)       Mobility Bed Mobility Overal bed mobility: Needs Assistance Bed Mobility: Supine to Sit     Supine to sit: Min guard     General bed mobility comments: Minguard assist for safety and lines  Transfers Overall transfer level: Needs assistance Equipment used: Straight cane;Rolling walker (2 wheeled) Transfers: Sit to/from Stand Sit to Stand: Min assist         General transfer comment: Min assist for safety and steadiness; a few tries before successfully  standing from bed and recliner; dependent on UE push/support from assistive device    Balance Overall balance assessment: Needs assistance   Sitting balance-Leahy Scale: Good       Standing balance-Leahy Scale: Fair                             ADL either performed or assessed with clinical judgement   ADL Overall ADL's : Needs assistance/impaired Eating/Feeding: Set up;Supervision/ safety;Sitting   Grooming: Min guard;Oral care;Standing Grooming Details (indicate cue type and reason): Pt performing oral care at sink with Min Guard A for safety. Pt with SpO2 at 77% on RA during activity. Placed on 2L O2 and he returned to 99% SpO2. Cued for purse lip breathing Upper Body Bathing: Set up;Supervision/ safety;Sitting   Lower Body Bathing: Min guard;Sit to/from stand   Upper Body Dressing : Set up;Supervision/safety;Sitting   Lower Body Dressing: Min guard;Sit to/from stand Lower Body Dressing Details (indicate cue type and reason): Able to adjust socks with increased effort while seated at EOB Toilet Transfer: Min guard;Ambulation;RW(simulated to recliner)           Functional mobility during ADLs: Min guard;Rolling walker;Cane General ADL Comments: Pt presenting with decreased activity tolerance as seen by decreased SpO2 and SOB. Pt motivated to participate in therapy     Vision Baseline Vision/History: Wears glasses Patient Visual Report: No change from baseline     Perception     Praxis      Cognition Arousal/Alertness: Awake/alert Behavior During  Therapy: WFL for tasks assessed/performed Overall Cognitive Status: Difficult to assess                                 General Comments: Following commands and retaining education.         Exercises     Shoulder Instructions       General Comments BP 116/52. 77% on RA with activity; cues for focused, deep breathing;  99% on 2L.     Pertinent Vitals/ Pain       Pain Assessment:  No/denies pain  Home Living Family/patient expects to be discharged to:: Private residence Living Arrangements: Alone Available Help at Discharge: Family;Available PRN/intermittently Type of Home: House Home Access: Stairs to enter [employer] of Steps: 3 Entrance Stairs-Rails: None Home Layout: One level     Bathroom Shower/Tub: Teacher, early years/pre: Standard     Home Equipment: Environmental consultant - 2 wheels;Cane - single point;Shower seat          Prior Functioning/Environment Level of Independence: Independent with assistive device(s)        Comments: Per chart review, as of November 2019, Ind amb without an AD in the home, [facility] in the community, Ind with ADLs, uses chronic O2 at home, unsure of liters per min.     Frequency  Min 2X/week        Progress Toward Goals  OT Goals(current goals can now be found in the care plan section)     Acute Rehab OT Goals Patient Stated Goal: did not specifically state OT Goal Formulation: With patient Time For Goal Achievement: 04/30/19 Potential to Achieve Goals: Good  Plan      Co-evaluation    PT/OT/SLP Co-Evaluation/Treatment: Yes Reason for Co-Treatment: For patient/therapist safety;To address functional/ADL transfers PT goals addressed during session: Mobility/safety with mobility OT goals addressed during session: ADL's and self-care      AM-PAC OT "6 Clicks" Daily Activity     Outcome Measure   Help from another person eating meals?: None Help from another person taking care of personal grooming?: A Little Help from another person toileting, which includes using toliet, bedpan, or urinal?: A Little Help from another person bathing (including washing, rinsing, drying)?: A Little Help from another person to put on and taking off regular upper body clothing?: None Help from another person to put on and taking off regular lower body clothing?: A Little 6 Click Score: 20    End of Session  Equipment Utilized During Treatment: Gait belt;Rolling walker;Oxygen  OT Visit Diagnosis: Unsteadiness on feet (R26.81);Other abnormalities of gait and mobility (R26.89);Muscle weakness (generalized) (M62.81);Other symptoms and signs involving cognitive function   Activity Tolerance Patient tolerated treatment well   Patient Left in chair;with call bell/phone within reach;with chair alarm set   Nurse Communication Mobility status        Time: 1660-6301 OT Time Calculation (min): 63 min  Charges: OT General Charges $OT Visit: 1 Visit OT Evaluation $OT Eval Moderate Complexity: 1 Mod OT Treatments $Self Care/Home Management : 8-22 mins  Berdie Malter MSOT, OTR/L Acute Rehab Pager: [phone] Office: [place] 04/16/2019, 3:40 PM

## 2019-04-16 NOTE — Progress Notes (Signed)
Physical Therapy Note  (Full PT eval to follow)  SATURATION QUALIFICATIONS: (This note is used to comply with regulatory documentation for home oxygen)  Patient Saturations on Room Air at Rest = 95%  Patient Saturations on Room Air while Ambulating = 79%  Patient Saturations on 2 Liters of oxygen while Ambulating = 93%  Please briefly explain why patient needs home oxygen:  Patient requires supplemental oxygen to maintain oxygen saturations at acceptable, safe levels with physical activity.  Cory Miller, [place]  Acute Rehabilitation Services Pager [phone] Office [phone]

## 2019-04-16 NOTE — Progress Notes (Signed)
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 Reports having fevers up to 101 F at home. Patient is not normally on oxygen, but found himself getting easily winded even with minimal exertion. Associated symptoms included sore throat, nasal congestion, diarrhea earlier in the week, and intermittent chest pain. He had continued to go to hemodialysis sessions and had a near complete dialysis session on 5/15. However, was sent to the emergency department for further evaluation due to symptoms. On admission patient was noted to blood pressures up to 191/71, pulse 107, respirations24, O2 saturations 94% on 2 L nasal cannula oxygen. Lab work revealed WBC 5.9, potassium 3.6, CO2 29, BUN 20, creatinine 3.09, and. Lactic acid 1.4. Chest x-ray showed chronic cardiomegaly with basilar congestion without any focal airspace opacity noted. COVID-19 testing was positive. Transfer was requested for need of hemodialysis and COVID-19 testing. Patient was accepted to a progressive bed here at [facility] for need of hemodialysis.   Assessment & Plan:  COVID 19 positive: Acute hypoxic respiratory failure Chronic diastolic CHF     - presenting with fever, tachycardia; qualifies as sepsis  - Patient presents with complaints of fever, chills, shortness of breath, and generalized malaise.      - Chest x-ray noted cardiomegaly without any signs of any focal infiltrate or edema.  - Found to be COVID-19 positive, but with limited oxygen requirements at this time. - Continuous pulse oximetry with nasal cannula oxygen as  needed - Combivent inhaler - follow ferritin, CRP uptredning; continue to follow still febrile.  ESRD on HD:      - Renal diet and carb modified with fluid restriction     - HD per nephrology  Possible UTI/asymptomatic bacteriuria:  - UA urine positive for large leukocytes with >50 WBCs. - UCx: pending - Rocephin IV; can change to augmentin for 2 more doses after HD  Second-degree heart block:  - New finding on EKG when compared to previous tracings. - Continue to monitor - Consult cardiology if needed  Essential hypertension:  - Blood pressure seen to be elevated up to 191/71. - Continue Norvasc, Coreg, clonidine, torsemide and hydralazine.  Diabetes mellitus type 2:  - Last hemoglobin A1c noted to be 6.9 in 09/2018. - Hypoglycemic protocol - Continue home Levemir 10 units at bedtime - CBGs with moderate sliding scale insulin  Anemia of chronic disease:  - Hemoglobin appears stable at 12 - Continue to monitor  Hypokalemia:  - Acute.  - Recheck potassium noted to be 3.4 after blood work obtained. - resolved had a nearly complete dialysis session on 5/16. - Renal diet and carb modified with fluid restriction - HD per nephrology  Deconditioning     - PT/OT consults recommends SNF.  DVT prophylaxis: heparin Code Status: FULL   Disposition Plan: TBD   Consultants:   Nephrology   Antimicrobials:  . Rocephin    Subjective: Patient is hard of hearing their communication was made with the use of written notes.  Patient denied any acute complaint no nausea no vomiting.  Objective: Vitals:   04/16/19 1630 04/16/19 1700 04/16/19 1730 04/16/19 1800  BP: (!) 97/56 132/88 (!) 104/46 123/86  Pulse: 94 88 82 90  Resp:      Temp:      TempSrc:      SpO2:      Weight:      Height:        Intake/Output Summary (Last 24 hours) at 04/16/2019 1824 Last data filed at 04/15/2019  2156 Gross per 24 hour  Intake 3 ml  Output -  Net 3 ml   Filed Weights   04/14/19 1730 04/15/19 0607 04/16/19 1415  Weight: 115 kg 115.3 kg 117.7 kg    Examination:  General: 78 y.o. male resting in bed in NAD Cardiovascular: RRR, +S1, S2, no m/g/r, equal pulses throughout, RUE fistula Respiratory: decreased at bases, tachypnic, no accessory muscle use GI: BS+, NDNT, no masses noted, no organomegaly noted MSK: No e/c/c Skin: No rashes, bruises, ulcerations noted Neuro: A&O x 3, no focal deficits, very hard of hearing Psyc: Appropriate interaction and affect, calm/cooperative    Data Reviewed: I have personally reviewed following labs and imaging studies.  CBC: Recent Labs  Lab 04/12/19 1513 04/13/19 0629 04/14/19 0805 04/14/19 1410 04/15/19 0557 04/16/19 0535  WBC 4.7 4.5 3.8* 4.1 4.0 3.9*  NEUTROABS 3.6 3.1 2.3  --  2.9 2.9  HGB 12.0*  11.9* 11.4* 10.9* 11.2* 10.8* 10.7*  HCT 37.6* 35.9* 34.2* 34.4* 34.1* 33.4*  MCV 100.8* 101.4* 99.4 98.6 100.3* 99.7  PLT 160 166 164 180 174 962   Basic Metabolic Panel: Recent Labs  Lab 04/12/19 1513 04/13/19 0629 04/14/19 0805 04/15/19 0557 04/16/19 0535  NA 138 137 136 136 135  K 3.4* 3.6 3.6 4.1 3.7  CL 94* 94* 95* 94* 94*  CO2 29 26 26 27 26   GLUCOSE 255* 190* 171* 215* 261*  BUN 27* 36* 56* 34* 53*  CREATININE 4.23* 4.84* 6.28* 5.02* 7.22*  CALCIUM 7.9* 7.6* 6.9* 7.4* 7.2*  MG  --  1.3* 1.3* 1.5* 1.5*  PHOS  --  3.5 3.8 3.2 4.2   GFR: Estimated Creatinine Clearance: 11.5 mL/min (A) (by C-G formula based on SCr of 7.22 mg/dL (H)). Liver Function Tests: Recent Labs  Lab 04/12/19 1513 04/13/19 0629 04/14/19 0805 04/15/19 0557 04/16/19 0535  AST 24 23 23 31  33  ALT 27 21 21 21 20   ALKPHOS 130* 120 113 119 120  BILITOT 1.0 1.0 1.1 0.9 0.6  PROT 7.0 6.5 6.2* 6.4* 5.9*  ALBUMIN 3.4* 3.1* 2.8* 2.7* 2.5*   No results for input(s): LIPASE, AMYLASE in the last 168 hours. No results for input(s): AMMONIA in  the last 168 hours. Coagulation Profile: Recent Labs  Lab 04/11/19 1936  INR 1.0   Cardiac Enzymes: Recent Labs  Lab 04/12/19 1513 04/13/19 0629 04/14/19 0805 04/15/19 0557 04/16/19 0535  CKTOTAL  --  81 86 98 75  TROPONINI 0.26*  --   --   --   --    BNP (last 3 results) No results for input(s): PROBNP in the last 8760 hours. HbA1C: No results for input(s): HGBA1C in the last 72 hours. CBG: Recent Labs  Lab 04/15/19 1547 04/15/19 2150 04/16/19 0734 04/16/19 1129 04/16/19 1538  GLUCAP 156* 126* 225* 176* 170*   Lipid Profile: Recent Labs    04/15/19 0557 04/16/19 0535  TRIG 263* 240*   Thyroid Function Tests: No results for input(s): TSH, T4TOTAL, FREET4, T3FREE, THYROIDAB in the last 72 hours. Anemia Panel: Recent Labs    04/15/19 0557 04/16/19 0535  FERRITIN 1,470* 1,761*   Sepsis Labs: Recent  Labs  Lab 04/11/19 1936 04/12/19 1513  PROCALCITON  --  0.90  LATICACIDVEN 1.4  --     Recent Results (from the past 240 hour(s))  Culture, blood (Routine x 2)     Status: None   Collection Time: 04/11/19  7:36 PM  Result Value Ref Range Status   Specimen Description BLOOD LEFT ANTECUBITAL  Final   Special Requests   Final    BOTTLES DRAWN AEROBIC AND ANAEROBIC Blood Culture results may not be optimal due to an excessive volume of blood received in culture bottles   Culture   Final    NO GROWTH 5 DAYS Performed at [facility], [address] [place], [place] [address]    Report Status 04/16/2019 FINAL  Final  SARS Coronavirus 2 (CEPHEID- Performed in [place] hospital lab), Hosp Order     Status: Abnormal   Collection Time: 04/11/19  7:36 PM  Result Value Ref Range Status   SARS Coronavirus 2 POSITIVE (A) NEGATIVE Final    Comment: RESULT CALLED TO, READ BACK BY AND VERIFIED WITH: CALLED TO [place] @2121  04/11/2019 SAC (NOTE) If result is NEGATIVE SARS-CoV-2 target nucleic acids are NOT DETECTED. The SARS-CoV-2 RNA is generally  detectable in upper and lower  respiratory specimens during the acute phase of infection. The lowest  concentration of SARS-CoV-2 viral copies this assay can detect is 250  copies / mL. A negative result does not preclude SARS-CoV-2 infection  and should not be used as the sole basis for treatment or other  patient management decisions.  A negative result may occur with  improper specimen collection / handling, submission of specimen other  than nasopharyngeal swab, presence of viral mutation(s) within the  areas targeted by this assay, and inadequate number of viral copies  (<250 copies / mL). A negative result must be combined with clinical  observations, patient history, and epidemiological information. If result is POSITIVE SARS-CoV-2 target nucleic acids are DETEC TED. The SARS-CoV-2 RNA is generally detectable in upper and lower  respiratory specimens during the acute phase of infection.  Positive  results are indicative of active infection with SARS-CoV-2.  Clinical  correlation with patient history and other diagnostic information is  necessary to determine patient infection status.  Positive results do  not rule out bacterial infection or co-infection with other viruses. If result is PRESUMPTIVE POSTIVE SARS-CoV-2 nucleic acids MAY BE PRESENT.   A presumptive positive result was obtained on the submitted specimen  and confirmed on repeat testing.  While 2019 novel coronavirus  (SARS-CoV-2) nucleic acids may be present in the submitted sample  additional confirmatory testing may be necessary for epidemiological  and / or clinical management purposes  to differentiate between  SARS-CoV-2 and other Sarbecovirus currently known to infect humans.  If clinically indicated additional testing with an alternate test  methodology (LAB7 [id number]) is advised. The SARS-CoV-2 RNA is generally  detectable in upper and lower respiratory specimens during the acute  phase of infection. The  expected result is Negative. Fact Sheet for Patients:  StrictlyIdeas.no Fact Sheet for Healthcare Providers: BankingDealers.co.za This test is not yet approved or cleared by the [place] FDA and has been authorized for detection and/or diagnosis of SARS-CoV-2 by FDA under an Emergency Use Authorization (EUA).  This EUA will remain in effect (meaning this test can be used) for the duration of the COVID-19 declaration under Section 564(b)(1) of the Act, 21 U.S.C. section 360bbb-3(b)(1), unless the authorization is terminated or revoked  sooner. Performed at [facility], [address] [place], [place] [address]   Culture, blood (Routine x 2)     Status: None   Collection Time: 04/11/19  7:38 PM  Result Value Ref Range Status   Specimen Description BLOOD LEFT HAND  Final   Special Requests   Final    BOTTLES DRAWN AEROBIC AND ANAEROBIC Blood Culture adequate volume   Culture   Final    NO GROWTH 5 DAYS Performed at [facility], [address] [place], [place] [address]    Report Status 04/16/2019 FINAL  Final  Urine Culture     Status: None   Collection Time: 04/15/19  1:47 AM  Result Value Ref Range Status   Specimen Description URINE, CLEAN CATCH  Final   Special Requests NONE  Final   Culture   Final    NO GROWTH Performed at [facility], [place] [address], [place], [place] [address]    Report Status 04/16/2019 FINAL  Final         Radiology Studies: No results found.      Scheduled Meds: . amoxicillin-clavulanate  500 mg Oral Q24H  . atorvastatin  40 mg Oral QHS  . benzonatate  100 mg Oral TID  . carvedilol  6.25 mg Oral BID WC  . Chlorhexidine Gluconate Cloth  6 each Topical Q0600  . Chlorhexidine Gluconate Cloth  6 each Topical Q0600  . cloNIDine  0.1 mg Oral TID  . clopidogrel  75 mg Oral Daily  . docusate sodium  100 mg Oral BID  . dorzolamide-timolol  1 drop Both Eyes BID  .  heparin  5,000 Units Subcutaneous Q8H  . insulin aspart  0-15 Units Subcutaneous TID WC  . insulin detemir  10 Units Subcutaneous QHS  . Ipratropium-Albuterol  1 puff Inhalation Q6H  . pantoprazole  40 mg Oral BID  . sodium chloride flush  3 mL Intravenous Q12H  . vitamin C  500 mg Oral Daily  . zinc sulfate  220 mg Oral Daily   Continuous Infusions: . sodium chloride    . sodium chloride       LOS: 4 days    Time spent: 35 minutes spent in the coordination of care today.   Berle Mull   If 7PM-7AM, please contact night-coverage [email or web address] Password [facility] 04/16/2019, 6:24 PM

## 2019-04-17 ENCOUNTER — Inpatient Hospital Stay (HOSPITAL_COMMUNITY): Payer: Medicare PPO

## 2019-04-17 LAB — CBC WITH DIFFERENTIAL/PLATELET
Abs Immature Granulocytes: 0.02 10*3/uL (ref 0.00–0.07)
Basophils Absolute: 0 10*3/uL (ref 0.0–0.1)
Basophils Relative: 0 %
Eosinophils Absolute: 0 10*3/uL (ref 0.0–0.5)
Eosinophils Relative: 1 %
HCT: 35.7 % — ABNORMAL LOW (ref 39.0–52.0)
Hemoglobin: 11.3 g/dL — ABNORMAL LOW (ref 13.0–17.0)
Immature Granulocytes: 1 %
Lymphocytes Relative: 23 %
Lymphs Abs: 1 10*3/uL (ref 0.7–4.0)
MCH: 31.7 pg (ref 26.0–34.0)
MCHC: 31.7 g/dL (ref 30.0–36.0)
MCV: 100 fL (ref 80.0–100.0)
Monocytes Absolute: 0.5 10*3/uL (ref 0.1–1.0)
Monocytes Relative: 11 %
Neutro Abs: 2.8 10*3/uL (ref 1.7–7.7)
Neutrophils Relative %: 64 %
Platelets: 176 10*3/uL (ref 150–400)
RBC: 3.57 MIL/uL — ABNORMAL LOW (ref 4.22–5.81)
RDW: 14.9 % (ref 11.5–15.5)
WBC: 4.3 10*3/uL (ref 4.0–10.5)
nRBC: 0.5 % — ABNORMAL HIGH (ref 0.0–0.2)

## 2019-04-17 LAB — COMPREHENSIVE METABOLIC PANEL
ALT: 28 U/L (ref 0–44)
AST: 49 U/L — ABNORMAL HIGH (ref 15–41)
Albumin: 2.6 g/dL — ABNORMAL LOW (ref 3.5–5.0)
Alkaline Phosphatase: 164 U/L — ABNORMAL HIGH (ref 38–126)
Anion gap: 13 (ref 5–15)
BUN: 28 mg/dL — ABNORMAL HIGH (ref 8–23)
CO2: 24 mmol/L (ref 22–32)
Calcium: 7.8 mg/dL — ABNORMAL LOW (ref 8.9–10.3)
Chloride: 101 mmol/L (ref 98–111)
Creatinine, Ser: 5.42 mg/dL — ABNORMAL HIGH (ref 0.61–1.24)
GFR calc Af Amer: 11 mL/min — ABNORMAL LOW (ref >60)
GFR calc non Af Amer: 9 mL/min — ABNORMAL LOW (ref >60)
Glucose, Bld: 153 mg/dL — ABNORMAL HIGH (ref 70–99)
Potassium: 4.3 mmol/L (ref 3.5–5.1)
Sodium: 138 mmol/L (ref 135–145)
Total Bilirubin: 1 mg/dL (ref 0.3–1.2)
Total Protein: 6.5 g/dL (ref 6.5–8.1)

## 2019-04-17 LAB — CK: Total CK: 75 U/L (ref 49–397)

## 2019-04-17 LAB — FERRITIN: Ferritin: 2419 ng/mL — ABNORMAL HIGH (ref 24–336)

## 2019-04-17 LAB — TRIGLYCERIDES: Triglycerides: 199 mg/dL — ABNORMAL HIGH (ref <150)

## 2019-04-17 LAB — C-REACTIVE PROTEIN: CRP: 11.4 mg/dL — ABNORMAL HIGH (ref <1.0)

## 2019-04-17 LAB — GLUCOSE, CAPILLARY
Glucose-Capillary: 178 mg/dL — ABNORMAL HIGH (ref 70–99)
Glucose-Capillary: 197 mg/dL — ABNORMAL HIGH (ref 70–99)
Glucose-Capillary: 181 mg/dL — ABNORMAL HIGH (ref 70–99)
Glucose-Capillary: 211 mg/dL — ABNORMAL HIGH (ref 70–99)

## 2019-04-17 LAB — D-DIMER, QUANTITATIVE: D-Dimer, Quant: 1.01 ug/mL-FEU — ABNORMAL HIGH (ref 0.00–0.50)

## 2019-04-17 LAB — PHOSPHORUS: Phosphorus: 3.3 mg/dL (ref 2.5–4.6)

## 2019-04-17 LAB — INTERLEUKIN-6, PLASMA: Interleukin-6, Plasma: 95 pg/mL — ABNORMAL HIGH (ref 0.0–12.2)

## 2019-04-17 LAB — MAGNESIUM: Magnesium: 1.6 mg/dL — ABNORMAL LOW (ref 1.7–2.4)

## 2019-04-17 MED ORDER — FAMOTIDINE 20 MG PO TABS
20.0000 mg | ORAL_TABLET | Freq: Every day | ORAL | Status: DC
  Administered 2019-04-17 – 2019-05-12 (×25): 20 mg via ORAL
  Filled 2019-04-17 (×25): qty 1

## 2019-04-17 MED ORDER — CHLORHEXIDINE GLUCONATE CLOTH 2 % EX PADS: 6.0000 | MEDICATED_PAD | Freq: Every day | CUTANEOUS | Status: DC

## 2019-04-17 NOTE — TOC Initial Note (Signed)
Transition of Care [facility]) - Initial/Assessment Note    Patient Details  Name: Cory Miller MRN: [id number] Date of Birth: 01/28/1941  Transition of Care [facility]) CM/SW Contact:    Eileen Stanford, LCSW Phone Number: 04/17/2019, 11:45 AM  Clinical Narrative:     Pt is positive COVID-19 and hard of hearing. Pt unable to hear CSW on the phone. CSW spoke with pt's sister and pt's sister would prefer pt come home at d/c. RN to communicate with pt via witting on paper at bedside and get back to CSW to determine what pt would prefer for d/c.            Expected Discharge Plan: [place] Barriers to Discharge: Continued Medical Work up   Patient Goals and CMS Choice Patient states their goals for this hospitalization and ongoing recovery are:: per pt's sister "pt to get over virus then come home"      Expected Discharge Plan and Services Expected Discharge Plan: [place] Choice: [place] arrangements for the past 2 months: Single Family Home                                      Prior Living Arrangements/Services Living arrangements for the past 2 months: Single Family Home Lives with:: Siblings Patient language and need for interpreter reviewed:: Yes Do you feel safe going back to the place where you live?: Yes      Need for Family Participation in Patient Care: Yes (Comment) Care giver support system in place?: Yes (comment)   Criminal Activity/Legal Involvement Pertinent to Current Situation/Hospitalization: No - Comment as needed  Activities of Daily Living Home Assistive Devices/Equipment: Cane (specify quad or straight) ADL Screening (condition at time of admission) Patient's cognitive ability adequate to safely complete daily activities?: Yes Is the patient deaf or have difficulty hearing?: Yes Does the patient have difficulty seeing, even when wearing glasses/contacts?: No Does the patient have  difficulty concentrating, remembering, or making decisions?: No Patient able to express need for assistance with ADLs?: Yes Does the patient have difficulty dressing or bathing?: No Independently performs ADLs?: Yes (appropriate for developmental age) Does the patient have difficulty walking or climbing stairs?: Yes Weakness of Legs: None Weakness of Arms/Hands: None  Permission Sought/Granted Permission sought to share information with : Facility Sport and exercise psychologist, Family Supports    Share Information with NAME: [name]     Permission granted to share info w Relationship: Sister     Emotional Assessment Appearance:: Appears stated age Attitude/Demeanor/Rapport: Unable to Assess Affect (typically observed): Unable to Assess Orientation: : Oriented to Self, Oriented to Place, Oriented to  Time, Oriented to Situation Alcohol / Substance Use: Not Applicable Psych Involvement: No (comment)  Admission diagnosis:  COVID-19 VIRUS INFECTION Patient Active Problem List   Diagnosis Date Noted  . Hypokalemia 04/12/2019  . Anemia of chronic disease 04/12/2019  . COVID-19 virus infection 04/12/2019  . End stage renal disease ([place]) 12/26/2018  . SOB (shortness of breath) 10/17/2018  . Pressure injury of skin 08/23/2018  . Respiratory failure ([place]) 08/22/2018  . Sepsis ([place]) 04/04/2016  . Diabetes ([place]) 04/04/2016  . HTN (hypertension) 04/04/2016  . GERD (gastroesophageal reflux disease) 04/04/2016  . CAD (coronary artery disease) 04/04/2016  . CKD (chronic kidney disease), stage IV ([place]) 04/04/2016  . Abdominal pain 08/02/2015  .  Abdominal pain, acute, epigastric   . Pain in the abdomen    PCP:  [name], MD Pharmacy:   [facility], [place], [place] - [address]. [place] [address] Phone: [phone] Fax: [phone]     Social Determinants of Health (SDOH) Interventions    Readmission Risk  Interventions No flowsheet data found.

## 2019-04-17 NOTE — Progress Notes (Signed)
[facility] ROUNDING NOTE   Subjective:   Summary this is a 78 year old gentleman with hypertension hyperlipidemia history of coronary artery disease status post CABG Monday Wednesday Friday end-stage renal disease.  He is also diabetic history of CVA and gastroesophageal reflux disease.  He presents [facility] if he is cough chills for 2 weeks.  He was COVID positive.  And is requiring oxygen supplementation to prevent hypoxic respiratory failure.  He underwent successful dialysis treatment 04/16/2019.  Ultrafiltration 903 cc.  Weight stable at 116.7 kg  Augmentin 500/125 mg daily  atorvastatin 40 mg a day Coreg to 6.25 mg twice daily clonidine 0.1 mg 3 times daily,  daily insulin 10 units subcu Levemir and sliding scale, Protonix 40 mg twice daily , Plavix 75 mg daily.,  Combivent inhaler 1 puff every 6 hours.  Blood pressure 133/59 pulse 85 temperature 102.1 max  Sodium 138 potassium 4.3 chloride 101 CO2 24 BUN 28 creatinine 5.42 glucose 153 calcium 7.8 phosphorus 3.3 magnesium 1.6 albumin 2.6 AST 49 ALT 28 CPK 75 triglycerides 199 CRP 11.4 WBC 4.3 hemoglobin 11.3 platelets 176  2D echo EF 55 to 60% 10/18/2018   Objective:  Vital signs in last 24 hours:  Temp:  [98.1 F (36.7 C)-102.1 F (38.9 C)] 98.6 F (37 C) (05/21 0011) Pulse Rate:  [76-94] 85 (05/21 0700) Resp:  [23-31] 31 (05/21 0700) BP: (75-133)/(22-88) 133/59 (05/21 0700) SpO2:  [91 %-100 %] 91 % (05/21 0700) Weight:  [116.7 kg-117.7 kg] 116.7 kg (05/21 0500)  Weight change:  Filed Weights   04/16/19 1415 04/16/19 1916 04/17/19 0500  Weight: 117.7 kg 116.8 kg 116.7 kg    Intake/Output: I/O last 3 completed shifts: In: 68 [P.O.:500; I.V.:6] Out: 903 [Other:903]   Intake/Output this shift:  No intake/output data recorded.  Physical examination deferred in light of COVID-19 medication strategies discussed with RN in detail trace edema normal work of breathing.  Dialyzes  [place] [phone]  MWF, [id number] AVF 4 hours EDW 116kg 4H/6PR   Basic Metabolic Panel: Recent Labs  Lab 04/13/19 0629 04/14/19 0805 04/15/19 0557 04/16/19 0535 04/17/19 0409  NA 137 136 136 135 138  K 3.6 3.6 4.1 3.7 4.3  CL 94* 95* 94* 94* 101  CO2 26 26 27 26 24   GLUCOSE 190* 171* 215* 261* 153*  BUN 36* 56* 34* 53* 28*  CREATININE 4.84* 6.28* 5.02* 7.22* 5.42*  CALCIUM 7.6* 6.9* 7.4* 7.2* 7.8*  MG 1.3* 1.3* 1.5* 1.5* 1.6*  PHOS 3.5 3.8 3.2 4.2 3.3    Liver Function Tests: Recent Labs  Lab 04/13/19 0629 04/14/19 0805 04/15/19 0557 04/16/19 0535 04/17/19 0409  AST 23 23 31  33 49*  ALT 21 21 21 20 28   ALKPHOS 120 113 119 120 164*  BILITOT 1.0 1.1 0.9 0.6 1.0  PROT 6.5 6.2* 6.4* 5.9* 6.5  ALBUMIN 3.1* 2.8* 2.7* 2.5* 2.6*   No results for input(s): LIPASE, AMYLASE in the last 168 hours. No results for input(s): AMMONIA in the last 168 hours.  CBC: Recent Labs  Lab 04/13/19 0629 04/14/19 0805 04/14/19 1410 04/15/19 0557 04/16/19 0535 04/17/19 0409  WBC 4.5 3.8* 4.1 4.0 3.9* 4.3  NEUTROABS 3.1 2.3  --  2.9 2.9 2.8  HGB 11.4* 10.9* 11.2* 10.8* 10.7* 11.3*  HCT 35.9* 34.2* 34.4* 34.1* 33.4* 35.7*  MCV 101.4* 99.4 98.6 100.3* 99.7 100.0  PLT 166 164 180 174 179 176    Cardiac Enzymes: Recent Labs  Lab 04/12/19 1513 04/13/19 0629  04/14/19 0805 04/15/19 0557 04/16/19 0535 04/17/19 0409  CKTOTAL  --  81 86 98 75 75  TROPONINI 0.26*  --   --   --   --   --     BNP: Invalid input(s): POCBNP  CBG: Recent Labs  Lab 04/15/19 2150 04/16/19 0734 04/16/19 1129 04/16/19 1538 04/16/19 2212  GLUCAP 126* 225* 176* 170* 105*    Microbiology: Results for orders placed or performed during the hospital encounter of 04/12/19  Urine Culture     Status: None   Collection Time: 04/15/19  1:47 AM  Result Value Ref Range Status   Specimen Description URINE, CLEAN CATCH  Final   Special Requests NONE  Final   Culture   Final    NO  GROWTH Performed at [facility], [place] [address] [place], [place] [address]    Report Status 04/16/2019 FINAL  Final    Coagulation Studies: No results for input(s): LABPROT, INR in the last 72 hours.  Urinalysis: No results for input(s): COLORURINE, LABSPEC, PHURINE, GLUCOSEU, HGBUR, BILIRUBINUR, KETONESUR, PROTEINUR, UROBILINOGEN, NITRITE, LEUKOCYTESUR in the last 72 hours.  Invalid input(s): APPERANCEUR    Imaging: No results found.   Medications:   . sodium chloride    . sodium chloride     . atorvastatin  40 mg Oral QHS  . benzonatate  100 mg Oral TID  . carvedilol  6.25 mg Oral BID WC  . Chlorhexidine Gluconate Cloth  6 each Topical Q0600  . Chlorhexidine Gluconate Cloth  6 each Topical Q0600  . cloNIDine  0.1 mg Oral TID  . clopidogrel  75 mg Oral Daily  . docusate sodium  100 mg Oral BID  . dorzolamide-timolol  1 drop Both Eyes BID  . heparin  5,000 Units Subcutaneous Q8H  . insulin aspart  0-15 Units Subcutaneous TID WC  . insulin detemir  10 Units Subcutaneous QHS  . Ipratropium-Albuterol  1 puff Inhalation Q6H  . pantoprazole  40 mg Oral BID  . sodium chloride flush  3 mL Intravenous Q12H  . vitamin C  500 mg Oral Daily  . zinc sulfate  220 mg Oral Daily   sodium chloride, sodium chloride, acetaminophen, alteplase, chlorpheniramine-HYDROcodone, guaiFENesin-dextromethorphan, heparin, heparin, hydrOXYzine, lidocaine (PF), lidocaine-prilocaine, ondansetron **OR** ondansetron (ZOFRAN) IV, oxyCODONE-acetaminophen, pentafluoroprop-tetrafluoroeth, zolpidem  Assessment/ Plan:   End-stage renal disease Monday Wednesday Friday tolerated dialysis 04/16/2019 next dialysis treatment will be planned for 04/17/2019 probably secondary to sepsis from COVID-19 infection.  Patient seen and evaluated during dialysis treatment 04/16/2019  Anemia no indication for ESA hemoglobin stable  Diabetes mellitus appears to be well controlled  Hypertension appears to be stable on  current doses of antihypertensive medications I   Metabolic acidosis resolved  Bones will will follow calcium phosphorus appear to be acceptable at this point  Pneumonia secondary to COVID-19   LOS: [place] @TODAY @8 :31 AM

## 2019-04-17 NOTE — Progress Notes (Signed)
Patient alert and oriented x4, hearing impaired.  Voiced complaints of dialysis technician, he informed that he no longer wanted her to provide services for him due to "attitude and lack of care", charge nurse informed.  Patient appeared agitated throughout shift.  Spoke with son overnight who verbalized that he would like for Dr. Posey Pronto to call him for patient status update.  Notes left for Dr. Posey Pronto.  No respiratory distress noted during shift, oxygen saturation 98% on tapered 1 liter oxygen.  Will continue to monitor.

## 2019-04-17 NOTE — Progress Notes (Signed)
Physical Therapy Treatment Patient Details Name: Cory Miller MRN: [id number] DOB: 1941-09-13 Today's Date: 04/17/2019    History of Present Illness Cory Miller is a 78 y.o. male with medical history significant of HTN, HLD, CAD  s/p CABG, ESRD on HD(M/W/F), DM type II, CVA, GERD, and hard of hearing; who presented to [facility] with complaints of fever, chills, cough, and shortness of breath approximately 2 weeks.  Reports having fevers up to 101 F at home. found to be Covid 19 +    PT Comments    Pt admitted with above diagnosis. Pt currently with functional limitations due to the deficits listed below (see PT Problem List). Pt was able to ambulate in room with RW with better stability, sats >90% and less DOE today.  Progressing. Still will need SNF as he is unsteady without the RW and has poor endurance.  Pt will benefit from skilled PT to increase their independence and safety with mobility to allow discharge to the venue listed below.    Follow Up Recommendations  SNF;Other (comment)(Will monitor for progress)      Equipment Recommendations  Other (comment)(consider rollator)    Recommendations for Other Services       Precautions / Restrictions Precautions Precautions: Fall Precaution Comments: Fall risk low (especially with RW) Restrictions Weight Bearing Restrictions: No    Mobility  Bed Mobility Overal bed mobility: Needs Assistance Bed Mobility: Supine to Sit     Supine to sit: Min guard     General bed mobility comments: Minguard assist for safety and lines  Transfers Overall transfer level: Needs assistance Equipment used: Rolling walker (2 wheeled) Transfers: Sit to/from Stand Sit to Stand: Min assist         General transfer comment: Min assist for safety and steadiness; dependent on UE push/support from assistive device  Ambulation/Gait Ambulation/Gait assistance: Min guard(with and without physical contact) Gait Distance  (Feet): 70 Feet(back and forth in room) Assistive device: Rolling walker (2 wheeled) Gait Pattern/deviations: Step-through pattern;Decreased stride length   Gait velocity interpretation: <1.31 ft/sec, indicative of household ambulator General Gait Details: walked with RW in room to decr work of walking on 2 L supplemental O2 with  DOE 2/4,O2 sats >90% throughout   [employer] Mobility    Modified Rankin (Stroke Patients Only)       Balance Overall balance assessment: Needs assistance Sitting-balance support: No upper extremity supported;Feet supported Sitting balance-Leahy Scale: Fair     Standing balance support: Bilateral upper extremity supported;During functional activity Standing balance-Leahy Scale: Fair Standing balance comment: Relies on UE support on RW>                             Cognition Arousal/Alertness: Awake/alert Behavior During Therapy: WFL for tasks assessed/performed Overall Cognitive Status: Difficult to assess                                 General Comments: Following commands and retaining education.       Exercises General Exercises - Lower Extremity Long Arc Quad: AROM;Both;10 reps;Seated    General Comments        Pertinent Vitals/Pain Pain Assessment: No/denies pain    Home Living  Prior Function            PT Goals (current goals can now be found in the care plan section) Acute Rehab PT Goals Patient Stated Goal: did not specifically state Progress towards PT goals: Progressing toward goals    Frequency    Min 3X/week      PT Plan Current plan remains appropriate    Co-evaluation              AM-PAC PT "6 Clicks" Mobility   Outcome Measure  Help needed turning from your back to your side while in a flat bed without using bedrails?: None Help needed moving from lying on your back to sitting on the side of a flat bed without using  bedrails?: A Little Help needed moving to and from a bed to a chair (including a wheelchair)?: A Little Help needed standing up from a chair using your arms (e.g., wheelchair or bedside chair)?: A Little Help needed to walk in hospital room?: A Little Help needed climbing 3-5 steps with a railing? : A Lot 6 Click Score: 18    End of Session Equipment Utilized During Treatment: Gait belt;Oxygen Activity Tolerance: Patient tolerated treatment well;Patient limited by fatigue Patient left: in chair;with chair alarm set;with call bell/phone within reach Nurse Communication: Mobility status;Other (comment)(Need for supplemental O2 with activity) PT Visit Diagnosis: Difficulty in walking, not elsewhere classified (R26.2);Other (comment)(decr functional capacity)     Time: 4327-6147 PT Time Calculation (min) (ACUTE ONLY): 19 min  Charges:  $Gait Training: 8-22 mins                     [place] Pager:  [phone]  Office:  [place] 04/17/2019, 1:12 PM

## 2019-04-17 NOTE — Progress Notes (Signed)
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 Reports having fevers up to 101 F at home. Patient is not normally on oxygen, but found himself getting easily winded even with minimal exertion. Associated symptoms included sore throat, nasal congestion, diarrhea earlier in the week, and intermittent chest pain. He had continued to go to hemodialysis sessions and had a near complete dialysis session on 5/15. However, was sent to the emergency department for further evaluation due to symptoms. On admission patient was noted to blood pressures up to 191/71, pulse 107, respirations24, O2 saturations 94% on 2 L nasal cannula oxygen. Lab work revealed WBC 5.9, potassium 3.6, CO2 29, BUN 20, creatinine 3.09, and. Lactic acid 1.4. Chest x-ray showed chronic cardiomegaly with basilar congestion without any focal airspace opacity noted. COVID-19 testing was positive. Transfer was requested for need of hemodialysis and COVID-19 testing. Patient was accepted to a progressive bed here at [facility] for need of hemodialysis.   Assessment & Plan:  COVID 19 positive: Acute hypoxic respiratory failure Chronic diastolic CHF     - presenting with fever, tachycardia; qualifies as sepsis  - Patient presents with complaints of fever, chills, shortness of breath, and generalized malaise.      - Chest x-ray noted cardiomegaly without any signs of any focal infiltrate or edema.  - Found to be COVID-19 positive, but with limited oxygen requirements at this time. - Continuous pulse oximetry with nasal cannula oxygen as  needed - Combivent inhaler - follow ferritin, CRP uptredning; continue to follow still febrile.  ESRD on HD:      - Renal diet and carb modified with fluid restriction     - HD per nephrology  Possible UTI/asymptomatic bacteriuria:  - UA urine positive for large leukocytes with >50 WBCs. - UCx: pending - Rocephin IV; can change to augmentin for 2 more doses after HD  Second-degree heart block:  - New finding on EKG when compared to previous tracings. - Continue to monitor - Consult cardiology if needed  Essential hypertension:  - Blood pressure seen to be elevated up to 191/71. - Continue Norvasc, Coreg, clonidine, torsemide and hydralazine.  Diabetes mellitus type 2:  - Last hemoglobin A1c noted to be 6.9 in 09/2018. - Hypoglycemic protocol - Continue home Levemir 10 units at bedtime - CBGs with moderate sliding scale insulin  Anemia of chronic disease:  - Hemoglobin appears stable at 12 - Continue to monitor  Hypokalemia:  - Acute.  - Recheck potassium noted to be 3.4 after blood work obtained. - resolved had a nearly complete dialysis session on 5/16. - Renal diet and carb modified with fluid restriction - HD per nephrology  Deconditioning     - PT/OT consults recommends SNF.  DVT prophylaxis: heparin Code Status: FULL   Disposition Plan: TBD   Consultants:   Nephrology   Antimicrobials:  . Rocephin    Subjective: Patient is hard of hearing their communication was made with the use of written notes.   Reported indigestion. Patient denied any acute complaint no nausea no vomiting.  Objective: Vitals:   04/17/19 0700 04/17/19 0937 04/17/19 1200 04/17/19 1710  BP: (!) 133/59 (!) 150/56 Cory Miller)  141/52 (!) 148/45  Pulse: 85 89 88 75  Resp: (!) 31 (!) 30 (!) 25 (!) 28  Temp:  98.8 F (37.1 C) 98.7 F (37.1 C) 99.4 F (37.4 C)  TempSrc:  Oral Oral Oral  SpO2: 91%  97% 99% 99%  Weight:      Height:        Intake/Output Summary (Last 24 hours) at 04/17/2019 1924 Last data filed at 04/17/2019 0600 Gross per 24 hour  Intake 503 ml  Output -  Net 503 ml   Filed Weights   04/16/19 1415 04/16/19 1916 04/17/19 0500  Weight: 117.7 kg 116.8 kg 116.7 kg    Examination:  General: 78 y.o. male resting in bed in NAD Cardiovascular: RRR, +S1, S2, no m/g/r, equal pulses throughout, RUE fistula Respiratory: decreased at bases, tachypnic, no accessory muscle use GI: BS+, NDNT, no masses noted, no organomegaly noted MSK: No e/c/c Skin: No rashes, bruises, ulcerations noted Neuro: A&O x 3, no focal deficits, very hard of hearing Psyc: Appropriate interaction and affect, calm/cooperative    Data Reviewed: I have personally reviewed following labs and imaging studies.  CBC: Recent Labs  Lab 04/13/19 0629 04/14/19 0805 04/14/19 1410 04/15/19 0557 04/16/19 0535 04/17/19 0409  WBC 4.5 3.8* 4.1 4.0 3.9* 4.3  NEUTROABS 3.1 2.3  --  2.9 2.9 2.8  HGB 11.4* 10.9* 11.2* 10.8* 10.7* 11.3*  HCT 35.9* 34.2* 34.4* 34.1* 33.4* 35.7*  MCV 101.4* 99.4 98.6 100.3* 99.7 100.0  PLT 166 164 180 174 179 094   Basic Metabolic Panel: Recent Labs  Lab 04/13/19 0629 04/14/19 0805 04/15/19 0557 04/16/19 0535 04/17/19 0409  NA 137 136 136 135 138  K 3.6 3.6 4.1 3.7 4.3  CL 94* 95* 94* 94* 101  CO2 26 26 27 26 24   GLUCOSE 190* 171* 215* 261* 153*  BUN 36* 56* 34* 53* 28*  CREATININE 4.84* 6.28* 5.02* 7.22* 5.42*  CALCIUM 7.6* 6.9* 7.4* 7.2* 7.8*  MG 1.3* 1.3* 1.5* 1.5* 1.6*  PHOS 3.5 3.8 3.2 4.2 3.3   GFR: Estimated Creatinine Clearance: 15.3 mL/min (A) (by C-G formula based on SCr of 5.42 mg/dL (H)). Liver Function Tests: Recent Labs  Lab 04/13/19 0629 04/14/19 0805 04/15/19 0557 04/16/19 0535 04/17/19 0409  AST 23 23 31  33 49*  ALT 21 21 21 20 28   ALKPHOS 120 113 119 120 164*  BILITOT 1.0 1.1 0.9 0.6 1.0  PROT 6.5 6.2* 6.4* 5.9* 6.5  ALBUMIN  3.1* 2.8* 2.7* 2.5* 2.6*   No results for input(s): LIPASE, AMYLASE in the last 168 hours. No results for input(s): AMMONIA in the last 168 hours. Coagulation Profile: Recent Labs  Lab 04/11/19 1936  INR 1.0   Cardiac Enzymes: Recent Labs  Lab 04/12/19 1513 04/13/19 0629 04/14/19 0805 04/15/19 0557 04/16/19 0535 04/17/19 0409  CKTOTAL  --  81 86 98 75 75  TROPONINI 0.26*  --   --   --   --   --    BNP (last 3 results) No results for input(s): PROBNP in the last 8760 hours. HbA1C: No results for input(s): HGBA1C in the last 72 hours. CBG: Recent Labs  Lab 04/16/19 1538 04/16/19 2212 04/17/19 0933 04/17/19 1234 04/17/19 1707  GLUCAP 170* 105* 178* 197* 181*   Lipid Profile: Recent Labs    04/16/19 0535 04/17/19 0409  TRIG 240* 199*   Thyroid Function Tests: No results for input(s): TSH, T4TOTAL, FREET4, T3FREE, THYROIDAB in the last 72 hours. Anemia Panel: Recent  Labs    04/16/19 0535 04/17/19 0409  FERRITIN 1,761* 2,419*   Sepsis Labs: Recent Labs  Lab 04/11/19 1936 04/12/19 1513  PROCALCITON  --  0.90  LATICACIDVEN 1.4  --     Recent Results (from the past 240 hour(s))  Culture, blood (Routine x 2)     Status: None   Collection Time: 04/11/19  7:36 PM  Result Value Ref Range Status   Specimen Description BLOOD LEFT ANTECUBITAL  Final   Special Requests   Final    BOTTLES DRAWN AEROBIC AND ANAEROBIC Blood Culture results may not be optimal due to an excessive volume of blood received in culture bottles   Culture   Final    NO GROWTH 5 DAYS Performed at [facility], [address] [place], [place] [address]    Report Status 04/16/2019 FINAL  Final  SARS Coronavirus 2 (CEPHEID- Performed in [place] hospital lab), Hosp Order     Status: Abnormal   Collection Time: 04/11/19  7:36 PM  Result Value Ref Range Status   SARS Coronavirus 2 POSITIVE (A) NEGATIVE Final    Comment: RESULT CALLED TO, READ BACK BY AND VERIFIED WITH: CALLED  TO [place] @2121  04/11/2019 SAC (NOTE) If result is NEGATIVE SARS-CoV-2 target nucleic acids are NOT DETECTED. The SARS-CoV-2 RNA is generally detectable in upper and lower  respiratory specimens during the acute phase of infection. The lowest  concentration of SARS-CoV-2 viral copies this assay can detect is 250  copies / mL. A negative result does not preclude SARS-CoV-2 infection  and should not be used as the sole basis for treatment or other  patient management decisions.  A negative result may occur with  improper specimen collection / handling, submission of specimen other  than nasopharyngeal swab, presence of viral mutation(s) within the  areas targeted by this assay, and inadequate number of viral copies  (<250 copies / mL). A negative result must be combined with clinical  observations, patient history, and epidemiological information. If result is POSITIVE SARS-CoV-2 target nucleic acids are DETEC TED. The SARS-CoV-2 RNA is generally detectable in upper and lower  respiratory specimens during the acute phase of infection.  Positive  results are indicative of active infection with SARS-CoV-2.  Clinical  correlation with patient history and other diagnostic information is  necessary to determine patient infection status.  Positive results do  not rule out bacterial infection or co-infection with other viruses. If result is PRESUMPTIVE POSTIVE SARS-CoV-2 nucleic acids MAY BE PRESENT.   A presumptive positive result was obtained on the submitted specimen  and confirmed on repeat testing.  While 2019 novel coronavirus  (SARS-CoV-2) nucleic acids may be present in the submitted sample  additional confirmatory testing may be necessary for epidemiological  and / or clinical management purposes  to differentiate between  SARS-CoV-2 and other Sarbecovirus currently known to infect humans.  If clinically indicated additional testing with an alternate test  methodology (LAB7  453) is advised. The SARS-CoV-2 RNA is generally  detectable in upper and lower respiratory specimens during the acute  phase of infection. The expected result is Negative. Fact Sheet for Patients:  StrictlyIdeas.no Fact Sheet for Healthcare Providers: BankingDealers.co.za This test is not yet approved or cleared by the [place] FDA and has been authorized for detection and/or diagnosis of SARS-CoV-2 by FDA under an Emergency Use Authorization (EUA).  This EUA will remain in effect (meaning this test can be used) for the duration of the COVID-19 declaration  under Section 564(b)(1) of the Act, 21 U.S.C. section 360bbb-3(b)(1), unless the authorization is terminated or revoked sooner. Performed at [facility], [address] [place], [place] [address]   Culture, blood (Routine x 2)     Status: None   Collection Time: 04/11/19  7:38 PM  Result Value Ref Range Status   Specimen Description BLOOD LEFT HAND  Final   Special Requests   Final    BOTTLES DRAWN AEROBIC AND ANAEROBIC Blood Culture adequate volume   Culture   Final    NO GROWTH 5 DAYS Performed at [facility], [address] [place], [place] [address]    Report Status 04/16/2019 FINAL  Final  Urine Culture     Status: None   Collection Time: 04/15/19  1:47 AM  Result Value Ref Range Status   Specimen Description URINE, CLEAN CATCH  Final   Special Requests NONE  Final   Culture   Final    NO GROWTH Performed at [facility], [place] [address], [place], [place] [address]    Report Status 04/16/2019 FINAL  Final         Radiology Studies: Dg Chest Port 1 View  Result Date: 04/17/2019 CLINICAL DATA:  Shortness of breath EXAM: PORTABLE CHEST 1 VIEW COMPARISON:  04/11/2019 FINDINGS: Previous coronary bypass changes. Similar cardiomegaly with low lung volumes and basilar atelectasis. Suspect small pleural effusions layering posteriorly. No  pneumothorax. Aorta atherosclerotic. Trachea is midline. IMPRESSION: Stable cardiomegaly, low lung volumes and basilar atelectasis. Suspect small pleural effusions. Electronically Signed   By: Jerilynn Mages.  Shick M.D.   On: 04/17/2019 09:15        Scheduled Meds: . atorvastatin  40 mg Oral QHS  . benzonatate  100 mg Oral TID  . carvedilol  6.25 mg Oral BID WC  . Chlorhexidine Gluconate Cloth  6 each Topical Q0600  . cloNIDine  0.1 mg Oral TID  . clopidogrel  75 mg Oral Daily  . docusate sodium  100 mg Oral BID  . dorzolamide-timolol  1 drop Both Eyes BID  . famotidine  20 mg Oral Daily  . heparin  5,000 Units Subcutaneous Q8H  . insulin aspart  0-15 Units Subcutaneous TID WC  . insulin detemir  10 Units Subcutaneous QHS  . Ipratropium-Albuterol  1 puff Inhalation Q6H  . pantoprazole  40 mg Oral BID  . sodium chloride flush  3 mL Intravenous Q12H  . vitamin C  500 mg Oral Daily  . zinc sulfate  220 mg Oral Daily   Continuous Infusions: . sodium chloride    . sodium chloride       LOS: 5 days    Time spent: 35 minutes spent in the coordination of care today.   Berle Mull   If 7PM-7AM, please contact night-coverage [email or web address] Password [facility] 04/17/2019, 7:24 PM

## 2019-04-18 LAB — RENAL FUNCTION PANEL
Albumin: 2.4 g/dL — ABNORMAL LOW (ref 3.5–5.0)
Anion gap: 14 (ref 5–15)
BUN: 45 mg/dL — ABNORMAL HIGH (ref 8–23)
CO2: 24 mmol/L (ref 22–32)
Calcium: 7.5 mg/dL — ABNORMAL LOW (ref 8.9–10.3)
Chloride: 98 mmol/L (ref 98–111)
Creatinine, Ser: 8.13 mg/dL — ABNORMAL HIGH (ref 0.61–1.24)
GFR calc Af Amer: 7 mL/min — ABNORMAL LOW (ref >60)
GFR calc non Af Amer: 6 mL/min — ABNORMAL LOW (ref >60)
Glucose, Bld: 198 mg/dL — ABNORMAL HIGH (ref 70–99)
Phosphorus: 4.1 mg/dL (ref 2.5–4.6)
Potassium: 4.6 mmol/L (ref 3.5–5.1)
Sodium: 136 mmol/L (ref 135–145)

## 2019-04-18 LAB — GLUCOSE, CAPILLARY
Glucose-Capillary: 203 mg/dL — ABNORMAL HIGH (ref 70–99)
Glucose-Capillary: 121 mg/dL — ABNORMAL HIGH (ref 70–99)
Glucose-Capillary: 151 mg/dL — ABNORMAL HIGH (ref 70–99)

## 2019-04-18 LAB — C-REACTIVE PROTEIN: CRP: 13.6 mg/dL — ABNORMAL HIGH (ref <1.0)

## 2019-04-18 LAB — FERRITIN: Ferritin: 2580 ng/mL — ABNORMAL HIGH (ref 24–336)

## 2019-04-18 MED ORDER — HEPARIN SODIUM (PORCINE) 1000 UNIT/ML DIALYSIS
1000.0000 [IU] | INTRAMUSCULAR | Status: DC | PRN
  Filled 2019-04-18: qty 1

## 2019-04-18 MED ORDER — SODIUM CHLORIDE 0.9 % IV SOLN: 100.0000 mL | INTRAVENOUS | Status: DC | PRN

## 2019-04-18 MED ORDER — PENTAFLUOROPROP-TETRAFLUOROETH EX AERO: 1.0000 "application " | INHALATION_SPRAY | CUTANEOUS | Status: DC | PRN

## 2019-04-18 MED ORDER — LIDOCAINE-PRILOCAINE 2.5-2.5 % EX CREA
1.0000 "application " | TOPICAL_CREAM | CUTANEOUS | Status: DC | PRN
  Filled 2019-04-18: qty 5

## 2019-04-18 MED ORDER — ALUM & MAG HYDROXIDE-SIMETH 200-200-20 MG/5ML PO SUSP: 15.0000 mL | ORAL | Status: DC | PRN

## 2019-04-18 MED ORDER — CLONIDINE HCL 0.1 MG PO TABS
0.1000 mg | ORAL_TABLET | Freq: Every day | ORAL | Status: DC
  Administered 2019-04-19 – 2019-04-25 (×5): 0.1 mg via ORAL
  Filled 2019-04-18 (×7): qty 1

## 2019-04-18 MED ORDER — ALTEPLASE 2 MG IJ SOLR: 2.0000 mg | Freq: Once | INTRAMUSCULAR | Status: DC | PRN

## 2019-04-18 MED ORDER — LIDOCAINE HCL (PF) 1 % IJ SOLN: 5.0000 mL | INTRAMUSCULAR | Status: DC | PRN

## 2019-04-18 NOTE — Progress Notes (Signed)
Inpatient Diabetes Program Recommendations  AACE/ADA: New Consensus Statement on Inpatient Glycemic Control (2015)  Target Ranges:  Prepandial:   less than 140 mg/dL      Peak postprandial:   less than 180 mg/dL (1-2 hours)      Critically ill patients:  140 - 180 mg/dL   Lab Results  Component Value Date   GLUCAP 203 (H) 04/18/2019   HGBA1C 6.9 (H) 10/18/2018    Review of Glycemic Control Results for CORNELIS, KLUVER (MRN [id number]) as of 04/18/2019 11:20  Ref. Range 04/17/2019 12:34 04/17/2019 17:07 04/17/2019 21:34 04/18/2019 06:25  Glucose-Capillary Latest Ref Range: 70 - 99 mg/dL 197 (H) 181 (H) 211 (H) 203 (H)   Diabetes history: Type 2 DM Outpatient Diabetes medications: Novolog 10 units TID, Levemir 10 units QHS Current orders for Inpatient glycemic control: Novolog 0-15 units TID, Levemir 10 units QHS  Inpatient Diabetes Program Recommendations:    If glucose trends continue to exceed 180 mg/dL, consider increasing Levemir to 12 units QHS.   Thanks, Bronson Curb, MSN, RNC-OB Diabetes Coordinator [phone] (8a-5p)

## 2019-04-18 NOTE — Progress Notes (Signed)
OT Cancellation Note  Patient Details Name: Cory [place] MRN: [id number] DOB: 10-14-1941   Cancelled Treatment:    Reason Eval/Treat Not Completed: Other (comment)(Pt having dialysis for 4 hours today. OT to follow-up.)   Darryl Nestle) Marsa Aris OTR/L Acute Rehabilitation Services Pager: [phone] Office: [place] 04/18/2019, 3:55 PM

## 2019-04-18 NOTE — [place] FL2 (Addendum)
[place] MEDICAID FL2 LEVEL OF CARE SCREENING TOOL     IDENTIFICATION  Patient Name: Cory Miller Birthdate: 1940-12-25 Sex: male Admission Date (Current Location): 04/12/2019  [facility] and [place] Number:  Herbalist and Address:  [facility], [place] [address] [place], [place] [address]      Provider Number: [id number]  Attending Physician Name and Address:  Lavina Hamman, MD  Relative Name and Phone Number:  Estill Dooms- sister- [phone]    Current Level of Care: Hospital Recommended Level of Care: [place] Prior Approval Number:    Date Approved/Denied:   PASRR Number: [id number]  Discharge Plan: SNF    Current Diagnoses: Patient Active Problem List   Diagnosis Date Noted  . Hypokalemia 04/12/2019  . Anemia of chronic disease 04/12/2019  . COVID-19 virus infection 04/12/2019  . End stage renal disease ([place]) 12/26/2018  . SOB (shortness of breath) 10/17/2018  . Pressure injury of skin 08/23/2018  . Respiratory failure ([place]) 08/22/2018  . Sepsis ([place]) 04/04/2016  . Diabetes ([place]) 04/04/2016  . HTN (hypertension) 04/04/2016  . GERD (gastroesophageal reflux disease) 04/04/2016  . CAD (coronary artery disease) 04/04/2016  . CKD (chronic kidney disease), stage IV ([place]) 04/04/2016  . Abdominal pain 08/02/2015  . Abdominal pain, acute, epigastric   . Pain in the abdomen     Orientation RESPIRATION BLADDER Height & Weight     Place, Situation, Time, Self  O2(Nasal Cannula 1L) Continent Weight: 253 lb 8.5 oz (115 kg) Height:  6\' 1"  (185.4 cm)  BEHAVIORAL SYMPTOMS/MOOD NEUROLOGICAL BOWEL NUTRITION STATUS      Continent Diet(Diet renal/carb modified with fluid restriction Diet-HS Snack? Nothing; Fluid restriction: 1200 mL Fluid)  AMBULATORY STATUS COMMUNICATION OF NEEDS Skin   Limited Assist Verbally Normal                       Personal Care Assistance Level of Assistance  Feeding, Bathing,  Dressing Bathing Assistance: Limited assistance Feeding assistance: Independent Dressing Assistance: Limited assistance     Functional Limitations Info  Sight, Hearing, Speech Sight Info: Adequate Hearing Info: Adequate Speech Info: Adequate    SPECIAL CARE FACTORS FREQUENCY  PT (By licensed PT), OT (By licensed OT)     PT Frequency: 5x OT Frequency: 5x            Contractures Contractures Info: Not present    Additional Factors Info  Code Status, Allergies, Isolation Precautions Code Status Info: Full Code Allergies Info: No known allergies     Isolation Precautions Info: COVID-19 positive     Current Medications (04/18/2019):  This is the current hospital active medication list Current Facility-Administered Medications  Medication Dose Route Frequency Provider Last Rate Last Dose  . 0.9 %  sodium chloride infusion  100 mL Intravenous PRN Edrick Oh, MD      . 0.9 %  sodium chloride infusion  100 mL Intravenous PRN Edrick Oh, MD      . acetaminophen (TYLENOL) tablet 650 mg  650 mg Oral Q4H PRN Schorr, Rhetta Mura, NP   650 mg at 04/16/19 2214  . alteplase (CATHFLO ACTIVASE) injection 2 mg  2 mg Intracatheter Once PRN Edrick Oh, MD      . alum & mag hydroxide-simeth (MAALOX/MYLANTA) 200-200-20 MG/5ML suspension 15 mL  15 mL Oral Q4H PRN Lavina Hamman, MD      . atorvastatin (LIPITOR) tablet 40 mg  40 mg Oral QHS Smith, Rondell A,  MD   40 mg at 04/17/19 2136  . benzonatate (TESSALON) capsule 100 mg  100 mg Oral TID Lavina Hamman, MD   100 mg at 04/18/19 0834  . carvedilol (COREG) tablet 6.25 mg  6.25 mg Oral BID WC Smith, Rondell A, MD   6.25 mg at 04/18/19 0630  . Chlorhexidine Gluconate Cloth 2 % PADS 6 each  6 each Topical Q0600 Justin Mend, MD   6 each at 04/18/19 0600  . chlorpheniramine-HYDROcodone (TUSSIONEX) 10-8 MG/5ML suspension 5 mL  5 mL Oral Q12H PRN Fuller Plan A, MD   5 mL at 04/15/19 2211  . [START ON 04/19/2019] cloNIDine (CATAPRES)  tablet 0.1 mg  0.1 mg Oral Daily Edrick Oh, MD      . clopidogrel (PLAVIX) tablet 75 mg  75 mg Oral Daily Fuller Plan A, MD   75 mg at 04/18/19 0834  . docusate sodium (COLACE) capsule 100 mg  100 mg Oral BID Fuller Plan A, MD   100 mg at 04/18/19 0834  . dorzolamide-timolol (COSOPT) 22.3-6.8 MG/ML ophthalmic solution 1 drop  1 drop Both Eyes BID Fuller Plan A, MD   1 drop at 04/18/19 0837  . famotidine (PEPCID) tablet 20 mg  20 mg Oral Daily Lavina Hamman, MD   20 mg at 04/18/19 0834  . guaiFENesin-dextromethorphan (ROBITUSSIN DM) 100-10 MG/5ML syrup 10 mL  10 mL Oral Q4H PRN Fuller Plan A, MD   10 mL at 04/17/19 0121  . heparin injection 1,000 Units  1,000 Units Dialysis PRN Edrick Oh, MD      . heparin injection 2,400 Units  20 Units/kg Dialysis PRN Justin Mend, MD      . heparin injection 5,000 Units  5,000 Units Subcutaneous Q8H Fuller Plan A, MD   5,000 Units at 04/18/19 6606  . hydrOXYzine (ATARAX/VISTARIL) tablet 25 mg  25 mg Oral QID PRN Fuller Plan A, MD      . insulin aspart (novoLOG) injection 0-15 Units  0-15 Units Subcutaneous TID WC Norval Morton, MD   5 Units at 04/18/19 [phone]  . insulin detemir (LEVEMIR) injection 10 Units  10 Units Subcutaneous QHS Norval Morton, MD   10 Units at 04/17/19 2135  . Ipratropium-Albuterol (COMBIVENT) respimat 1 puff  1 puff Inhalation Q6H Fuller Plan A, MD   1 puff at 04/18/19 0837  . lidocaine (PF) (XYLOCAINE) 1 % injection 5 mL  5 mL Intradermal PRN Edrick Oh, MD      . lidocaine-prilocaine (EMLA) cream 1 application  1 application Topical PRN Edrick Oh, MD      . ondansetron [facility]) tablet 4 mg  4 mg Oral Q6H PRN Fuller Plan A, MD       Or  . ondansetron (ZOFRAN) injection 4 mg  4 mg Intravenous Q6H PRN Fuller Plan A, MD      . oxyCODONE-acetaminophen (PERCOCET/ROXICET) 5-325 MG per tablet 1 tablet  1 tablet Oral Q8H PRN Norval Morton, MD   1 tablet at 04/14/19 1813  . pantoprazole (PROTONIX)  EC tablet 40 mg  40 mg Oral BID Fuller Plan A, MD   40 mg at 04/18/19 0834  . pentafluoroprop-tetrafluoroeth (GEBAUERS) aerosol 1 application  1 application Topical PRN Edrick Oh, MD      . sodium chloride flush (NS) 0.9 % injection 3 mL  3 mL Intravenous Q12H Smith, Rondell A, MD   3 mL at 04/18/19 0837  . vitamin C (ASCORBIC ACID) tablet 500 mg  500 mg Oral Daily Fuller Plan A, MD   500 mg at 04/18/19 0834  . zinc sulfate capsule 220 mg  220 mg Oral Daily Tamala Julian, Rondell A, MD   220 mg at 04/18/19 5830  . zolpidem (AMBIEN) tablet 5 mg  5 mg Oral QHS PRN Norval Morton, MD   5 mg at 04/12/19 2242     Discharge Medications: Please see discharge summary for a list of discharge medications.  Relevant Imaging Results:  Relevant Lab Results:   Additional Information SSN: [id number]  Gets dialysis M, W, F at [facility] in [place], [place]

## 2019-04-18 NOTE — Progress Notes (Signed)
[facility] ROUNDING NOTE   Subjective:   Summary this is a 78 year old gentleman with hypertension hyperlipidemia history of coronary artery disease status post CABG Monday Wednesday Friday end-stage renal disease.  He is also diabetic history of CVA and gastroesophageal reflux disease.  He presents [facility] if he is cough chills for 2 weeks.  He was COVID positive.  And is requiring oxygen supplementation to prevent hypoxic respiratory failure.  He underwent successful dialysis treatment 04/16/2019.  Ultrafiltration 903 cc.  Weight stable at 116.7 kg.  Plan for dialysis 04/18/2019  atorvastatin 40 mg a day Coreg to 6.25 mg twice daily clonidine 0.1 mg 3 times daily,  daily insulin 10 units subcu Levemir and sliding scale, Protonix 40 mg twice daily , Plavix 75 mg daily.,  Combivent inhaler 1 puff every 6 hours.  Blood pressure 115/50 pulse 77 temperature 97.5 O2 sats 99% 1 L nasal cannula  Sodium 136 potassium 4.6 chloride 98 CO2 24 BUN 845 creatinine 8.13 glucose 198 calcium 7.5 phosphorus 4.1 albumin 2.4 hemoglobin 11.3  2D echo EF 55 to 60% 10/18/2018   Objective:  Vital signs in last 24 hours:  Temp:  [97.5 F (36.4 C)-99.5 F (37.5 C)] 97.5 F (36.4 C) (05/22 0833) Pulse Rate:  [75-89] 77 (05/22 0833) Resp:  [16-35] 16 (05/22 0833) BP: (112-150)/(45-56) 115/50 (05/22 0833) SpO2:  [87 %-99 %] 99 % (05/22 0833) Weight:  [114.3 kg] 114.3 kg (05/22 0500)  Weight change: -3.4 kg Filed Weights   04/16/19 1916 04/17/19 0500 04/18/19 0500  Weight: 116.8 kg 116.7 kg 114.3 kg    Intake/Output: I/O last 3 completed shifts: In: 326 [P.O.:740; I.V.:3] Out: -    Intake/Output this shift:  No intake/output data recorded.  Physical examination deferred in light of COVID-19 medication strategies discussed with RN in detail trace edema normal work of breathing.  [facility] [phone]  MWF, BFR400 PJA250 AVF 4 hours EDW 116kg  5L/9JQ   Basic Metabolic Panel: Recent Labs  Lab 04/13/19 0629 04/14/19 0805 04/15/19 0557 04/16/19 0535 04/17/19 0409 04/18/19 0653  NA 137 136 136 135 138 136  K 3.6 3.6 4.1 3.7 4.3 4.6  CL 94* 95* 94* 94* 101 98  CO2 26 26 27 26 24 24   GLUCOSE 190* 171* 215* 261* 153* 198*  BUN 36* 56* 34* 53* 28* 45*  CREATININE 4.84* 6.28* 5.02* 7.22* 5.42* 8.13*  CALCIUM 7.6* 6.9* 7.4* 7.2* 7.8* 7.5*  MG 1.3* 1.3* 1.5* 1.5* 1.6*  --   PHOS 3.5 3.8 3.2 4.2 3.3 4.1    Liver Function Tests: Recent Labs  Lab 04/13/19 0629 04/14/19 0805 04/15/19 0557 04/16/19 0535 04/17/19 0409 04/18/19 0653  AST 23 23 31  33 49*  --   ALT 21 21 21 20 28   --   ALKPHOS 120 113 119 120 164*  --   BILITOT 1.0 1.1 0.9 0.6 1.0  --   PROT 6.5 6.2* 6.4* 5.9* 6.5  --   ALBUMIN 3.1* 2.8* 2.7* 2.5* 2.6* 2.4*   No results for input(s): LIPASE, AMYLASE in the last 168 hours. No results for input(s): AMMONIA in the last 168 hours.  CBC: Recent Labs  Lab 04/13/19 0629 04/14/19 0805 04/14/19 1410 04/15/19 0557 04/16/19 0535 04/17/19 0409  WBC 4.5 3.8* 4.1 4.0 3.9* 4.3  NEUTROABS 3.1 2.3  --  2.9 2.9 2.8  HGB 11.4* 10.9* 11.2* 10.8* 10.7* 11.3*  HCT 35.9* 34.2* 34.4* 34.1* 33.4* 35.7*  MCV 101.4* 99.4 98.6 100.3* 99.7  100.0  PLT 166 164 180 174 179 176    Cardiac Enzymes: Recent Labs  Lab 04/12/19 1513 04/13/19 0629 04/14/19 0805 04/15/19 0557 04/16/19 0535 04/17/19 0409  CKTOTAL  --  81 86 98 75 75  TROPONINI 0.26*  --   --   --   --   --     BNP: Invalid input(s): POCBNP  CBG: Recent Labs  Lab 04/17/19 0933 04/17/19 1234 04/17/19 1707 04/17/19 2134 04/18/19 0625  GLUCAP 178* 197* 181* 211* 22*    Microbiology: Results for orders placed or performed during the hospital encounter of 04/12/19  Urine Culture     Status: None   Collection Time: 04/15/19  1:47 AM  Result Value Ref Range Status   Specimen Description URINE, CLEAN CATCH  Final   Special Requests NONE  Final    Culture   Final    NO GROWTH Performed at [facility], [place] [address] [place], [place] [address]    Report Status 04/16/2019 FINAL  Final    Coagulation Studies: No results for input(s): LABPROT, INR in the last 72 hours.  Urinalysis: No results for input(s): COLORURINE, LABSPEC, PHURINE, GLUCOSEU, HGBUR, BILIRUBINUR, KETONESUR, PROTEINUR, UROBILINOGEN, NITRITE, LEUKOCYTESUR in the last 72 hours.  Invalid input(s): APPERANCEUR    Imaging: Dg Chest Port 1 View  Result Date: 04/17/2019 CLINICAL DATA:  Shortness of breath EXAM: PORTABLE CHEST 1 VIEW COMPARISON:  04/11/2019 FINDINGS: Previous coronary bypass changes. Similar cardiomegaly with low lung volumes and basilar atelectasis. Suspect small pleural effusions layering posteriorly. No pneumothorax. Aorta atherosclerotic. Trachea is midline. IMPRESSION: Stable cardiomegaly, low lung volumes and basilar atelectasis. Suspect small pleural effusions. Electronically Signed   By: Jerilynn Mages.  Shick M.D.   On: 04/17/2019 09:15     Medications:   . sodium chloride    . sodium chloride     . atorvastatin  40 mg Oral QHS  . benzonatate  100 mg Oral TID  . carvedilol  6.25 mg Oral BID WC  . Chlorhexidine Gluconate Cloth  6 each Topical Q0600  . cloNIDine  0.1 mg Oral TID  . clopidogrel  75 mg Oral Daily  . docusate sodium  100 mg Oral BID  . dorzolamide-timolol  1 drop Both Eyes BID  . famotidine  20 mg Oral Daily  . heparin  5,000 Units Subcutaneous Q8H  . insulin aspart  0-15 Units Subcutaneous TID WC  . insulin detemir  10 Units Subcutaneous QHS  . Ipratropium-Albuterol  1 puff Inhalation Q6H  . pantoprazole  40 mg Oral BID  . sodium chloride flush  3 mL Intravenous Q12H  . vitamin C  500 mg Oral Daily  . zinc sulfate  220 mg Oral Daily   sodium chloride, sodium chloride, acetaminophen, alteplase, chlorpheniramine-HYDROcodone, guaiFENesin-dextromethorphan, heparin, heparin, hydrOXYzine, lidocaine (PF), lidocaine-prilocaine,  ondansetron **OR** ondansetron (ZOFRAN) IV, oxyCODONE-acetaminophen, pentafluoroprop-tetrafluoroeth, zolpidem  Assessment/ Plan:   End-stage renal disease Monday Wednesday Friday tolerated dialysis 04/16/2019 next dialysis treatment will be planned for 04/18/2019 patient has been seen and evaluated during dialysis treatment  Anemia no indication for ESA hemoglobin stable  Diabetes mellitus appears to be well controlled  Hypertension we will titrate clonidine to 0.1 mg daily due to lowish blood pressures  Metabolic acidosis resolved  Bones will will follow calcium phosphorus appear to be acceptable at this point  Pneumonia secondary to COVID-19 stable requiring 1 L nasal cannula   LOS: [place] @TODAY @9 :26 AM

## 2019-04-18 NOTE — Progress Notes (Addendum)
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 Reports having fevers up to 101 F at home. Patient is not normally on oxygen, but found himself getting easily winded even with minimal exertion. Associated symptoms included sore throat, nasal congestion, diarrhea earlier in the week, and intermittent chest pain. He had continued to go to hemodialysis sessions and had a near complete dialysis session on 5/15. However, was sent to the emergency department for further evaluation due to symptoms. On admission patient was noted to blood pressures up to 191/71, pulse 107, respirations24, O2 saturations 94% on 2 L nasal cannula oxygen. Lab work revealed WBC 5.9, potassium 3.6, CO2 29, BUN 20, creatinine 3.09, and. Lactic acid 1.4. Chest x-ray showed chronic cardiomegaly with basilar congestion without any focal airspace opacity noted. COVID-19 testing was positive. Transfer was requested for need of hemodialysis and COVID-19 testing. Patient was accepted to a progressive bed here at [facility] for need of hemodialysis.   Assessment & Plan:  COVID 19 positive: Acute hypoxic respiratory failure Chronic diastolic CHF     - presenting with fever, tachycardia; qualifies as sepsis  - Patient presents with complaints of fever, chills, shortness of breath, and generalized malaise.      - Chest x-ray noted cardiomegaly without any signs of any focal infiltrate or edema.  - Found to be COVID-19 positive, but with limited oxygen requirements at this time. - Continuous pulse oximetry with nasal cannula oxygen as  needed - Combivent inhaler - follow ferritin, CRP uptredning; Which is concerning and suggestive of high chances of morbidity mortality down the road.continue to follow still febrile.  ESRD on HD:      - Renal diet and carb modified with fluid restriction     - HD per nephrology  Possible UTI/asymptomatic bacteriuria:  - UA urine positive for large leukocytes with >50 WBCs. - UCx: pending - Rocephin IV; can change to augmentin for 2 more doses after HD  Second-degree heart block:  - New finding on EKG when compared to previous tracings. - Continue to monitor - Consult cardiology if needed  Essential hypertension:  - Blood pressure seen to be elevated up to 191/71. - Continue Norvasc, Coreg, clonidine, torsemide and hydralazine.  Diabetes mellitus type 2:  - Last hemoglobin A1c noted to be 6.9 in 09/2018. - Hypoglycemic protocol - Continue home Levemir 10 units at bedtime - CBGs with moderate sliding scale insulin  Anemia of chronic disease:  - Hemoglobin appears stable at 12 - Continue to monitor  Hypokalemia:  - Acute.  - Recheck potassium noted to be 3.4 after blood work obtained. - resolved had a nearly complete dialysis session on 5/16. - Renal diet and carb modified with fluid restriction - HD per nephrology  Deconditioning     - PT/OT consults recommends SNF.  Indigestion. Continue Pepcid and add Maalox.  DVT prophylaxis: heparin Code Status: FULL   Disposition Plan: TBD   Consultants:   Nephrology   Antimicrobials:  . Rocephin    Subjective: Patient is hard of hearing their communication was made with the use of written notes.   Feeling better.  No nausea no vomiting no fever no  chills.  Denies any pain.  Had some headache this morning which resolved.  Objective: Vitals:   04/18/19 1416 04/18/19 1431 04/18/19 1458 04/18/19 1637  BP: (!) 116/54 (!) 124/59  (!) 143/59 (!) 152/56  Pulse:  87 83 92  Resp:   (!) 30   Temp:      TempSrc:      SpO2:      Weight:      Height:        Intake/Output Summary (Last 24 hours) at 04/18/2019 1852 Last data filed at 04/18/2019 0400 Gross per 24 hour  Intake 240 ml  Output -  Net 240 ml   Filed Weights   04/17/19 0500 04/18/19 0500 04/18/19 1035  Weight: 116.7 kg 114.3 kg 115 kg    Examination:  General: 78 y.o. male resting in bed in NAD Cardiovascular: RRR, +S1, S2, no m/g/r, equal pulses throughout, RUE fistula Respiratory: decreased at bases, tachypnic, no accessory muscle use GI: BS+, NDNT, no masses noted, no organomegaly noted MSK: No e/c/c Skin: No rashes, bruises, ulcerations noted Neuro: A&O x 3, no focal deficits, very hard of hearing Psyc: Appropriate interaction and affect, calm/cooperative    Data Reviewed: I have personally reviewed following labs and imaging studies.  CBC: Recent Labs  Lab 04/13/19 0629 04/14/19 0805 04/14/19 1410 04/15/19 0557 04/16/19 0535 04/17/19 0409  WBC 4.5 3.8* 4.1 4.0 3.9* 4.3  NEUTROABS 3.1 2.3  --  2.9 2.9 2.8  HGB 11.4* 10.9* 11.2* 10.8* 10.7* 11.3*  HCT 35.9* 34.2* 34.4* 34.1* 33.4* 35.7*  MCV 101.4* 99.4 98.6 100.3* 99.7 100.0  PLT 166 164 180 174 179 937   Basic Metabolic Panel: Recent Labs  Lab 04/13/19 0629 04/14/19 0805 04/15/19 0557 04/16/19 0535 04/17/19 0409 04/18/19 0653  NA 137 136 136 135 138 136  K 3.6 3.6 4.1 3.7 4.3 4.6  CL 94* 95* 94* 94* 101 98  CO2 26 26 27 26 24 24   GLUCOSE 190* 171* 215* 261* 153* 198*  BUN 36* 56* 34* 53* 28* 45*  CREATININE 4.84* 6.28* 5.02* 7.22* 5.42* 8.13*  CALCIUM 7.6* 6.9* 7.4* 7.2* 7.8* 7.5*  MG 1.3* 1.3* 1.5* 1.5* 1.6*  --   PHOS 3.5 3.8 3.2 4.2 3.3 4.1   GFR: Estimated Creatinine Clearance: 10.1 mL/min (A) (by C-G formula based on SCr of 8.13 mg/dL (H)). Liver Function Tests: Recent Labs  Lab 04/13/19 0629 04/14/19 0805 04/15/19 0557 04/16/19 0535 04/17/19 0409  04/18/19 0653  AST 23 23 31  33 49*  --   ALT 21 21 21 20 28   --   ALKPHOS 120 113 119 120 164*  --   BILITOT 1.0 1.1 0.9 0.6 1.0  --   PROT 6.5 6.2* 6.4* 5.9* 6.5  --   ALBUMIN 3.1* 2.8* 2.7* 2.5* 2.6* 2.4*   No results for input(s): LIPASE, AMYLASE in the last 168 hours. No results for input(s): AMMONIA in the last 168 hours. Coagulation Profile: Recent Labs  Lab 04/11/19 1936  INR 1.0   Cardiac Enzymes: Recent Labs  Lab 04/12/19 1513 04/13/19 0629 04/14/19 0805 04/15/19 0557 04/16/19 0535 04/17/19 0409  CKTOTAL  --  81 86 98 75 75  TROPONINI 0.26*  --   --   --   --   --    BNP (last 3 results) No results for input(s): PROBNP in the last 8760 hours. HbA1C: No results for input(s): HGBA1C in the last 72 hours. CBG: Recent Labs  Lab 04/17/19 1707 04/17/19  2134 04/18/19 0625 04/18/19 1234 04/18/19 1648  GLUCAP 181* 211* 203* 121* 151*   Lipid Profile: Recent Labs    04/16/19 0535 04/17/19 0409  TRIG 240* 199*   Thyroid Function Tests: No results for input(s): TSH, T4TOTAL, FREET4, T3FREE, THYROIDAB in the last 72 hours. Anemia Panel: Recent Labs    04/17/19 0409 04/18/19 0940  FERRITIN 2,419* 2,580*   Sepsis Labs: Recent Labs  Lab 04/11/19 1936 04/12/19 1513  PROCALCITON  --  0.90  LATICACIDVEN 1.4  --     Recent Results (from the past 240 hour(s))  Culture, blood (Routine x 2)     Status: None   Collection Time: 04/11/19  7:36 PM  Result Value Ref Range Status   Specimen Description BLOOD LEFT ANTECUBITAL  Final   Special Requests   Final    BOTTLES DRAWN AEROBIC AND ANAEROBIC Blood Culture results may not be optimal due to an excessive volume of blood received in culture bottles   Culture   Final    NO GROWTH 5 DAYS Performed at [facility], [address] [place], [place] [address]    Report Status 04/16/2019 FINAL  Final  SARS Coronavirus 2 (CEPHEID- Performed in [place] hospital lab), Hosp Order     Status: Abnormal    Collection Time: 04/11/19  7:36 PM  Result Value Ref Range Status   SARS Coronavirus 2 POSITIVE (A) NEGATIVE Final    Comment: RESULT CALLED TO, READ BACK BY AND VERIFIED WITH: CALLED TO [place] @2121  04/11/2019 SAC (NOTE) If result is NEGATIVE SARS-CoV-2 target nucleic acids are NOT DETECTED. The SARS-CoV-2 RNA is generally detectable in upper and lower  respiratory specimens during the acute phase of infection. The lowest  concentration of SARS-CoV-2 viral copies this assay can detect is 250  copies / mL. A negative result does not preclude SARS-CoV-2 infection  and should not be used as the sole basis for treatment or other  patient management decisions.  A negative result may occur with  improper specimen collection / handling, submission of specimen other  than nasopharyngeal swab, presence of viral mutation(s) within the  areas targeted by this assay, and inadequate number of viral copies  (<250 copies / mL). A negative result must be combined with clinical  observations, patient history, and epidemiological information. If result is POSITIVE SARS-CoV-2 target nucleic acids are DETEC TED. The SARS-CoV-2 RNA is generally detectable in upper and lower  respiratory specimens during the acute phase of infection.  Positive  results are indicative of active infection with SARS-CoV-2.  Clinical  correlation with patient history and other diagnostic information is  necessary to determine patient infection status.  Positive results do  not rule out bacterial infection or co-infection with other viruses. If result is PRESUMPTIVE POSTIVE SARS-CoV-2 nucleic acids MAY BE PRESENT.   A presumptive positive result was obtained on the submitted specimen  and confirmed on repeat testing.  While 2019 novel coronavirus  (SARS-CoV-2) nucleic acids may be present in the submitted sample  additional confirmatory testing may be necessary for epidemiological  and / or clinical management  purposes  to differentiate between  SARS-CoV-2 and other Sarbecovirus currently known to infect humans.  If clinically indicated additional testing with an alternate test  methodology (LAB7 [id number]) is advised. The SARS-CoV-2 RNA is generally  detectable in upper and lower respiratory specimens during the acute  phase of infection. The expected result is Negative. Fact Sheet for Patients:  StrictlyIdeas.no Fact Sheet for Healthcare  Providers: BankingDealers.co.za This test is not yet approved or cleared by the [place] and has been authorized for detection and/or diagnosis of SARS-CoV-2 by FDA under an Emergency Use Authorization (EUA).  This EUA will remain in effect (meaning this test can be used) for the duration of the COVID-19 declaration under Section 564(b)(1) of the Act, 21 U.S.C. section 360bbb-3(b)(1), unless the authorization is terminated or revoked sooner. Performed at [facility], [address] [place], [place] [address]   Culture, blood (Routine x 2)     Status: None   Collection Time: 04/11/19  7:38 PM  Result Value Ref Range Status   Specimen Description BLOOD LEFT HAND  Final   Special Requests   Final    BOTTLES DRAWN AEROBIC AND ANAEROBIC Blood Culture adequate volume   Culture   Final    NO GROWTH 5 DAYS Performed at [facility], [address] [place], [place] [address]    Report Status 04/16/2019 FINAL  Final  Urine Culture     Status: None   Collection Time: 04/15/19  1:47 AM  Result Value Ref Range Status   Specimen Description URINE, CLEAN CATCH  Final   Special Requests NONE  Final   Culture   Final    NO GROWTH Performed at [facility], [place] [address], [place], [place] [address]    Report Status 04/16/2019 FINAL  Final  Culture, blood (routine x 2)     Status: None (Preliminary result)   Collection Time: 04/17/19  8:50 AM  Result Value Ref Range Status   Specimen  Description BLOOD LEFT HAND  Final   Special Requests   Final    BOTTLES DRAWN AEROBIC ONLY Blood Culture adequate volume   Culture   Final    NO GROWTH 1 DAY Performed at [facility], [place] [address] [place], [place] [address]    Report Status PENDING  Incomplete  Culture, blood (routine x 2)     Status: None (Preliminary result)   Collection Time: 04/17/19  8:55 AM  Result Value Ref Range Status   Specimen Description BLOOD LEFT HAND  Final   Special Requests   Final    BOTTLES DRAWN AEROBIC ONLY Blood Culture adequate volume   Culture   Final    NO GROWTH 1 DAY Performed at [facility], [place] [address], [place], [place] [address]    Report Status PENDING  Incomplete         Radiology Studies: Dg Chest Port 1 View  Result Date: 04/17/2019 CLINICAL DATA:  Shortness of breath EXAM: PORTABLE CHEST 1 VIEW COMPARISON:  04/11/2019 FINDINGS: Previous coronary bypass changes. Similar cardiomegaly with low lung volumes and basilar atelectasis. Suspect small pleural effusions layering posteriorly. No pneumothorax. Aorta atherosclerotic. Trachea is midline. IMPRESSION: Stable cardiomegaly, low lung volumes and basilar atelectasis. Suspect small pleural effusions. Electronically Signed   By: Jerilynn Mages.  Shick M.D.   On: 04/17/2019 09:15        Scheduled Meds: . atorvastatin  40 mg Oral QHS  . benzonatate  100 mg Oral TID  . carvedilol  6.25 mg Oral BID WC  . Chlorhexidine Gluconate Cloth  6 each Topical Q0600  . [START ON 04/19/2019] cloNIDine  0.1 mg Oral Daily  . clopidogrel  75 mg Oral Daily  . docusate sodium  100 mg Oral BID  . dorzolamide-timolol  1 drop Both Eyes BID  . famotidine  20 mg Oral Daily  .  heparin  5,000 Units Subcutaneous Q8H  . insulin aspart  0-15 Units Subcutaneous TID WC  . insulin detemir  10 Units Subcutaneous QHS  . Ipratropium-Albuterol  1 puff Inhalation Q6H  . pantoprazole  40 mg Oral BID  . sodium chloride flush  3 mL Intravenous Q12H  .  vitamin C  500 mg Oral Daily  . zinc sulfate  220 mg Oral Daily   Continuous Infusions: . sodium chloride    . sodium chloride       LOS: 6 days    Time spent: 35 minutes spent in the coordination of care today.   Berle Mull   If 7PM-7AM, please contact night-coverage [email or web address] Password [facility] 04/18/2019, 6:52 PM

## 2019-04-18 NOTE — TOC Progression Note (Signed)
Transition of Care [facility]) - Progression Note    Patient Details  Name: Truitt Cruey MRN: [id number] Date of Birth: 15-Mar-1941  Transition of Care [facility]) CM/SW Contact  Eileen Stanford, LCSW Phone Number: 04/18/2019, 3:41 PM  Clinical Narrative:   CSW received a call from pt's sister. At this time family has decided pt will need to go to SNF in order for pt to get to dialysis and get adequate therapy. Pt would prefer [facility] in [place]. CSW to follow up with facility.    Expected Discharge Plan: [place] Barriers to Discharge: Continued Medical Work up  Expected Discharge Plan and Services Expected Discharge Plan: [place] Choice: [place] arrangements for the past 2 months: Single Family Home                                       Social Determinants of Health (SDOH) Interventions    Readmission Risk Interventions Readmission Risk Prevention Plan 04/17/2019  Transportation Screening Complete  Medication Review [employer]) Complete  PCP or Specialist appointment within 3-5 days of discharge Complete  HRI or [place] Not Complete  SW Recovery Care/Counseling Consult Complete  Palliative Care Screening Not Applicable  Skilled Nursing Facility Complete  Some recent data might be hidden

## 2019-04-19 ENCOUNTER — Inpatient Hospital Stay (HOSPITAL_COMMUNITY): Payer: Medicare PPO

## 2019-04-19 LAB — GLUCOSE, CAPILLARY
Glucose-Capillary: 198 mg/dL — ABNORMAL HIGH (ref 70–99)
Glucose-Capillary: 174 mg/dL — ABNORMAL HIGH (ref 70–99)
Glucose-Capillary: 202 mg/dL — ABNORMAL HIGH (ref 70–99)

## 2019-04-19 LAB — RENAL FUNCTION PANEL
Albumin: 2.2 g/dL — ABNORMAL LOW (ref 3.5–5.0)
Anion gap: 16 — ABNORMAL HIGH (ref 5–15)
BUN: 24 mg/dL — ABNORMAL HIGH (ref 8–23)
CO2: 25 mmol/L (ref 22–32)
Calcium: 7.9 mg/dL — ABNORMAL LOW (ref 8.9–10.3)
Chloride: 95 mmol/L — ABNORMAL LOW (ref 98–111)
Creatinine, Ser: 6.75 mg/dL — ABNORMAL HIGH (ref 0.61–1.24)
GFR calc Af Amer: 8 mL/min — ABNORMAL LOW (ref >60)
GFR calc non Af Amer: 7 mL/min — ABNORMAL LOW (ref >60)
Glucose, Bld: 203 mg/dL — ABNORMAL HIGH (ref 70–99)
Phosphorus: 4.1 mg/dL (ref 2.5–4.6)
Potassium: 4.2 mmol/L (ref 3.5–5.1)
Sodium: 136 mmol/L (ref 135–145)

## 2019-04-19 LAB — INTERLEUKIN-6, PLASMA: Interleukin-6, Plasma: 945.9 pg/mL — ABNORMAL HIGH (ref 0.0–12.2)

## 2019-04-19 NOTE — Progress Notes (Signed)
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 Reports having fevers up to 101 F at home. Patient is not normally on oxygen, but found himself getting easily winded even with minimal exertion. Associated symptoms included sore throat, nasal congestion, diarrhea earlier in the week, and intermittent chest pain. He had continued to go to hemodialysis sessions and had a near complete dialysis session on 5/15. However, was sent to the emergency department for further evaluation due to symptoms. On admission patient was noted to blood pressures up to 191/71, pulse 107, respirations24, O2 saturations 94% on 2 L nasal cannula oxygen. Lab work revealed WBC 5.9, potassium 3.6, CO2 29, BUN 20, creatinine 3.09, and. Lactic acid 1.4. Chest x-ray showed chronic cardiomegaly with basilar congestion without any focal airspace opacity noted. COVID-19 testing was positive. Transfer was requested for need of hemodialysis and COVID-19 testing. Patient was accepted to a progressive bed here at [facility] for need of hemodialysis.   Assessment & Plan:  COVID 19 positive: Acute hypoxic respiratory failure Chronic diastolic CHF     - presenting with fever, tachycardia; qualifies as sepsis  - Patient presents with complaints of fever, chills, shortness of breath, and generalized malaise.      - Chest x-ray noted cardiomegaly without any signs of any focal infiltrate or edema.  - Found to be COVID-19 positive, but with limited oxygen requirements at this time. - Continuous pulse oximetry with nasal cannula oxygen as  needed - Combivent inhaler - follow ferritin, CRP uptredning; Which is concerning and suggestive of high chances of morbidity mortality down the road. continue to follow still febrile. Checking chest x-ray and x-ray abdomen.  ESRD on HD:      - Renal diet and carb modified with fluid restriction     - HD per nephrology  Possible UTI/asymptomatic bacteriuria:  - UA urine positive for large leukocytes with >50 WBCs. - UCx: Negative.  Completed antibiotics.  Second-degree heart block:  - New finding on EKG when compared to previous tracings. - Continue to monitor - Consult cardiology if needed  Essential hypertension:  - Blood pressure seen to be elevated up to 191/71. - Continue Norvasc, Coreg, clonidine, torsemide and hydralazine.  Diabetes mellitus type 2:  - Last hemoglobin A1c noted to be 6.9 in 09/2018. - Hypoglycemic protocol - Continue home Levemir 10 units at bedtime - CBGs with moderate sliding scale insulin  Anemia of chronic disease:  - Hemoglobin appears stable at 12 - Continue to monitor  Hypokalemia:  - Acute.  - Recheck potassium noted to be 3.4 after blood work obtained. - resolved had a nearly complete dialysis session on 5/16. - Renal diet and carb modified with fluid restriction - HD per nephrology  Deconditioning     - PT/OT consults recommends SNF.  Indigestion. Continue Pepcid and add Maalox.  DVT prophylaxis: heparin Code Status: FULL   Disposition Plan: TBD   Consultants:   Nephrology   Antimicrobials:  . Rocephin    Subjective: Patient is hard of hearing their communication was made with the use of written notes.   Not feeling better.  Reports fatigue and tiredness.  Also on exam right-sided  tenderness.  Objective: Vitals:   04/18/19 2209 04/18/19 2300 04/19/19 0000 04/19/19 0800  BP: (!) 107/45 (!) 111/49 (!) 136/45   Pulse: 89 87 84    Resp: 20 (!) 30 (!) 26   Temp: 98.7 F (37.1 C)   99.6 F (37.6 C)  TempSrc:      SpO2: 96% 96% 99%   Weight:      Height:       No intake or output data in the 24 hours ending 04/19/19 1711 Filed Weights   04/17/19 0500 04/18/19 0500 04/18/19 1035  Weight: 116.7 kg 114.3 kg 115 kg    Examination:  General: 78 y.o. male resting in bed in NAD Cardiovascular: RRR, +S1, S2, no m/g/r, equal pulses throughout, RUE fistula Respiratory: decreased at bases, tachypnic, no accessory muscle use GI: BS+, NDNT, no masses noted, no organomegaly noted MSK: No e/c/c Skin: No rashes, bruises, ulcerations noted Neuro: A&O x 3, no focal deficits, very hard of hearing Psyc: Appropriate interaction and affect, calm/cooperative    Data Reviewed: I have personally reviewed following labs and imaging studies.  CBC: Recent Labs  Lab 04/13/19 0629 04/14/19 0805 04/14/19 1410 04/15/19 0557 04/16/19 0535 04/17/19 0409  WBC 4.5 3.8* 4.1 4.0 3.9* 4.3  NEUTROABS 3.1 2.3  --  2.9 2.9 2.8  HGB 11.4* 10.9* 11.2* 10.8* 10.7* 11.3*  HCT 35.9* 34.2* 34.4* 34.1* 33.4* 35.7*  MCV 101.4* 99.4 98.6 100.3* 99.7 100.0  PLT 166 164 180 174 179 161   Basic Metabolic Panel: Recent Labs  Lab 04/13/19 0629 04/14/19 0805 04/15/19 0557 04/16/19 0535 04/17/19 0409 04/18/19 0653 04/19/19 0708  NA 137 136 136 135 138 136 136  K 3.6 3.6 4.1 3.7 4.3 4.6 4.2  CL 94* 95* 94* 94* 101 98 95*  CO2 26 26 27 26 24 24 25   GLUCOSE 190* 171* 215* 261* 153* 198* 203*  BUN 36* 56* 34* 53* 28* 45* 24*  CREATININE 4.84* 6.28* 5.02* 7.22* 5.42* 8.13* 6.75*  CALCIUM 7.6* 6.9* 7.4* 7.2* 7.8* 7.5* 7.9*  MG 1.3* 1.3* 1.5* 1.5* 1.6*  --   --   PHOS 3.5 3.8 3.2 4.2 3.3 4.1 4.1   GFR: Estimated Creatinine Clearance: 12.2 mL/min (A) (by C-G formula based on SCr of 6.75 mg/dL (H)). Liver Function Tests: Recent Labs  Lab 04/13/19 0629 04/14/19 0805 04/15/19 0557 04/16/19 0535 04/17/19 0409 04/18/19 0653 04/19/19 0708   AST 23 23 31  33 49*  --   --   ALT 21 21 21 20 28   --   --   ALKPHOS 120 113 119 120 164*  --   --   BILITOT 1.0 1.1 0.9 0.6 1.0  --   --   PROT 6.5 6.2* 6.4* 5.9* 6.5  --   --   ALBUMIN 3.1* 2.8* 2.7* 2.5* 2.6* 2.4* 2.2*   No results for input(s): LIPASE, AMYLASE in the last 168 hours. No results for input(s): AMMONIA in the last 168 hours. Coagulation Profile: No results for input(s): INR, PROTIME in the last 168 hours. Cardiac Enzymes: Recent Labs  Lab 04/13/19 0629 04/14/19 0805 04/15/19 0557 04/16/19 0535 04/17/19 0409  CKTOTAL 81 86 98 75 75   BNP (last 3 results) No results for input(s): PROBNP in the last 8760 hours. HbA1C: No results for input(s): HGBA1C in the last 72 hours. CBG: Recent Labs  Lab 04/18/19 1234 04/18/19 1648 04/19/19 0857 04/19/19 1220 04/19/19 1649  GLUCAP 121* 151* 198* 174* 202*   Lipid  Profile: Recent Labs    04/17/19 0409  TRIG 199*   Thyroid Function Tests: No results for input(s): TSH, T4TOTAL, FREET4, T3FREE, THYROIDAB in the last 72 hours. Anemia Panel: Recent Labs    04/17/19 0409 04/18/19 0940  FERRITIN 2,419* 2,580*   Sepsis Labs: No results for input(s): PROCALCITON, LATICACIDVEN in the last 168 hours.  Recent Results (from the past 240 hour(s))  Culture, blood (Routine x 2)     Status: None   Collection Time: 04/11/19  7:36 PM  Result Value Ref Range Status   Specimen Description BLOOD LEFT ANTECUBITAL  Final   Special Requests   Final    BOTTLES DRAWN AEROBIC AND ANAEROBIC Blood Culture results may not be optimal due to an excessive volume of blood received in culture bottles   Culture   Final    NO GROWTH 5 DAYS Performed at [facility], [address], [place], [place] [address]    Report Status 04/16/2019 FINAL  Final  SARS Coronavirus 2 (CEPHEID- Performed in [place] hospital lab), Hosp Order     Status: Abnormal   Collection Time: 04/11/19  7:36 PM  Result Value Ref Range Status   SARS  Coronavirus 2 POSITIVE (A) NEGATIVE Final    Comment: RESULT CALLED TO, READ BACK BY AND VERIFIED WITH: CALLED TO [place] @2121  04/11/2019 SAC (NOTE) If result is NEGATIVE SARS-CoV-2 target nucleic acids are NOT DETECTED. The SARS-CoV-2 RNA is generally detectable in upper and lower  respiratory specimens during the acute phase of infection. The lowest  concentration of SARS-CoV-2 viral copies this assay can detect is 250  copies / mL. A negative result does not preclude SARS-CoV-2 infection  and should not be used as the sole basis for treatment or other  patient management decisions.  A negative result may occur with  improper specimen collection / handling, submission of specimen other  than nasopharyngeal swab, presence of viral mutation(s) within the  areas targeted by this assay, and inadequate number of viral copies  (<250 copies / mL). A negative result must be combined with clinical  observations, patient history, and epidemiological information. If result is POSITIVE SARS-CoV-2 target nucleic acids are DETEC TED. The SARS-CoV-2 RNA is generally detectable in upper and lower  respiratory specimens during the acute phase of infection.  Positive  results are indicative of active infection with SARS-CoV-2.  Clinical  correlation with patient history and other diagnostic information is  necessary to determine patient infection status.  Positive results do  not rule out bacterial infection or co-infection with other viruses. If result is PRESUMPTIVE POSTIVE SARS-CoV-2 nucleic acids MAY BE PRESENT.   A presumptive positive result was obtained on the submitted specimen  and confirmed on repeat testing.  While 2019 novel coronavirus  (SARS-CoV-2) nucleic acids may be present in the submitted sample  additional confirmatory testing may be necessary for epidemiological  and / or clinical management purposes  to differentiate between  SARS-CoV-2 and other Sarbecovirus currently  known to infect humans.  If clinically indicated additional testing with an alternate test  methodology (LAB7 [id number]) is advised. The SARS-CoV-2 RNA is generally  detectable in upper and lower respiratory specimens during the acute  phase of infection. The expected result is Negative. Fact Sheet for Patients:  StrictlyIdeas.no Fact Sheet for Healthcare Providers: BankingDealers.co.za This test is not yet approved or cleared by the [place] FDA and has been authorized for detection and/or diagnosis of SARS-CoV-2 by FDA under an Emergency Use  Authorization (EUA).  This EUA will remain in effect (meaning this test can be used) for the duration of the COVID-19 declaration under Section 564(b)(1) of the Act, 21 U.S.C. section 360bbb-3(b)(1), unless the authorization is terminated or revoked sooner. Performed at [facility], [address] [place], [place] [address]   Culture, blood (Routine x 2)     Status: None   Collection Time: 04/11/19  7:38 PM  Result Value Ref Range Status   Specimen Description BLOOD LEFT HAND  Final   Special Requests   Final    BOTTLES DRAWN AEROBIC AND ANAEROBIC Blood Culture adequate volume   Culture   Final    NO GROWTH 5 DAYS Performed at [facility], [address] [place], [place] [address]    Report Status 04/16/2019 FINAL  Final  Urine Culture     Status: None   Collection Time: 04/15/19  1:47 AM  Result Value Ref Range Status   Specimen Description URINE, CLEAN CATCH  Final   Special Requests NONE  Final   Culture   Final    NO GROWTH Performed at [facility], [place] [address] [place], [place] [address]    Report Status 04/16/2019 FINAL  Final  Culture, blood (routine x 2)     Status: None (Preliminary result)   Collection Time: 04/17/19  8:50 AM  Result Value Ref Range Status   Specimen Description BLOOD LEFT HAND  Final   Special Requests   Final    BOTTLES DRAWN  AEROBIC ONLY Blood Culture adequate volume   Culture   Final    NO GROWTH 2 DAYS Performed at [facility], [place] [address], [place], [place] [address]    Report Status PENDING  Incomplete  Culture, blood (routine x 2)     Status: None (Preliminary result)   Collection Time: 04/17/19  8:55 AM  Result Value Ref Range Status   Specimen Description BLOOD LEFT HAND  Final   Special Requests   Final    BOTTLES DRAWN AEROBIC ONLY Blood Culture adequate volume   Culture   Final    NO GROWTH 2 DAYS Performed at [facility], [place] [address] [place], [place] [address]    Report Status PENDING  Incomplete         Radiology Studies: Dg Chest Port 1 View  Result Date: 04/19/2019 CLINICAL DATA:  Shortness of breath EXAM: PORTABLE CHEST 1 VIEW COMPARISON:  Chest x-ray dated 04/17/2019 and chest x-ray dated 10/19/2018. FINDINGS: Stable cardiomegaly. Median sternotomy wires appear stable in alignment. Persistent bibasilar opacities, likely atelectasis and/or small pleural effusions. No new lung findings. IMPRESSION: Stable chest x-ray. Persistent bibasilar opacities, likely atelectasis and/or small pleural effusions. Electronically Signed   By: Franki Cabot M.D.   On: 04/19/2019 13:09   Dg Abd Portable 1v  Result Date: 04/19/2019 CLINICAL DATA:  RIGHT lower quadrant abdominal pain. EXAM: PORTABLE ABDOMEN - 1 VIEW COMPARISON:  None. FINDINGS: Visualized bowel gas pattern is nonobstructive. No evidence of soft tissue mass or abnormal fluid collection. No evidence of free intraperitoneal air. No evidence of renal or ureteral calculi. Osseous structures are unremarkable. IMPRESSION: Negative. Electronically Signed   By: Franki Cabot M.D.   On: 04/19/2019 13:10        Scheduled Meds: . atorvastatin  40 mg Oral QHS  . benzonatate  100 mg Oral TID  . carvedilol  6.25 mg Oral BID WC  . Chlorhexidine Gluconate Cloth  6  each Topical [id number]  . cloNIDine  0.1 mg Oral Daily  . clopidogrel   75 mg Oral Daily  . docusate sodium  100 mg Oral BID  . dorzolamide-timolol  1 drop Both Eyes BID  . famotidine  20 mg Oral Daily  . heparin  5,000 Units Subcutaneous Q8H  . insulin aspart  0-15 Units Subcutaneous TID WC  . insulin detemir  10 Units Subcutaneous QHS  . Ipratropium-Albuterol  1 puff Inhalation Q6H  . pantoprazole  40 mg Oral BID  . sodium chloride flush  3 mL Intravenous Q12H  . vitamin C  500 mg Oral Daily  . zinc sulfate  220 mg Oral Daily   Continuous Infusions: . sodium chloride    . sodium chloride       LOS: 7 days    Time spent: 35 minutes spent in the coordination of care today.   Berle Mull   If 7PM-7AM, please contact night-coverage [email or web address] Password [email or web address] 04/19/2019, 5:11 PM

## 2019-04-19 NOTE — Progress Notes (Signed)
[facility] ROUNDING NOTE   Subjective:   Summary this is a 78 year old gentleman with hypertension hyperlipidemia history of coronary artery disease status post CABG Monday Wednesday Friday end-stage renal disease.  He is also diabetic history of CVA and gastroesophageal reflux disease.  He presents [facility] if he is cough chills for 2 weeks.  He was COVID positive.  And is requiring oxygen supplementation to prevent hypoxic respiratory failure.  Underwent successful dialysis treatment with removal of 2.7 L 04/18/2019  atorvastatin 40 mg a day Coreg to 6.25 mg twice daily clonidine 0.1 mg 3 times daily,  daily insulin 10 units subcu Levemir and sliding scale, Protonix 40 mg twice daily , Plavix 75 mg daily.,  Combivent inhaler 1 puff every 6 hours.  Blood pressure 144/58 pulse 93 temperature 99.6 O2 sats 92%  Sodium 136 potassium 4.2 chloride 95 CO2 25 BUN 24 creatinine 6.75 glucose 203 calcium 7.9 phosphorus 4.1 albumin 2.2  2D echo EF 55 to 60% 10/18/2018   Objective:  Vital signs in last 24 hours:  Temp:  [97.6 F (36.4 C)-98.7 F (37.1 C)] 98.7 F (37.1 C) (05/22 2209) Pulse Rate:  [76-99] 84 (05/23 0000) Resp:  [20-38] 26 (05/23 0000) BP: (107-147)/(34-71) 136/45 (05/23 0000) SpO2:  [93 %-100 %] 99 % (05/23 0000) Weight:  [831 kg] 115 kg (05/22 1035)  Weight change: 0.7 kg Filed Weights   04/17/19 0500 04/18/19 0500 04/18/19 1035  Weight: 116.7 kg 114.3 kg 115 kg    Intake/Output: I/O last 3 completed shifts: In: 240 [P.O.:240] Out: -    Intake/Output this shift:  No intake/output data recorded.  Physical examination deferred in light of COVID-19 medication strategies discussed with RN in detail trace edema normal work of breathing.  Dialyzes [place] [phone]  MWF, [id number] AVF 4 hours EDW 116kg 4W/5IO   Basic Metabolic Panel: Recent Labs  Lab 04/13/19 0629 04/14/19 0805 04/15/19 0557 04/16/19 0535  04/17/19 0409 04/18/19 0653 04/19/19 0708  NA 137 136 136 135 138 136 136  K 3.6 3.6 4.1 3.7 4.3 4.6 4.2  CL 94* 95* 94* 94* 101 98 95*  CO2 26 26 27 26 24 24 25   GLUCOSE 190* 171* 215* 261* 153* 198* 203*  BUN 36* 56* 34* 53* 28* 45* 24*  CREATININE 4.84* 6.28* 5.02* 7.22* 5.42* 8.13* 6.75*  CALCIUM 7.6* 6.9* 7.4* 7.2* 7.8* 7.5* 7.9*  MG 1.3* 1.3* 1.5* 1.5* 1.6*  --   --   PHOS 3.5 3.8 3.2 4.2 3.3 4.1 4.1    Liver Function Tests: Recent Labs  Lab 04/13/19 0629 04/14/19 0805 04/15/19 0557 04/16/19 0535 04/17/19 0409 04/18/19 0653 04/19/19 0708  AST 23 23 31  33 49*  --   --   ALT 21 21 21 20 28   --   --   ALKPHOS 120 113 119 120 164*  --   --   BILITOT 1.0 1.1 0.9 0.6 1.0  --   --   PROT 6.5 6.2* 6.4* 5.9* 6.5  --   --   ALBUMIN 3.1* 2.8* 2.7* 2.5* 2.6* 2.4* 2.2*   No results for input(s): LIPASE, AMYLASE in the last 168 hours. No results for input(s): AMMONIA in the last 168 hours.  CBC: Recent Labs  Lab 04/13/19 0629 04/14/19 0805 04/14/19 1410 04/15/19 0557 04/16/19 0535 04/17/19 0409  WBC 4.5 3.8* 4.1 4.0 3.9* 4.3  NEUTROABS 3.1 2.3  --  2.9 2.9 2.8  HGB 11.4* 10.9* 11.2* 10.8* 10.7* 11.3*  HCT 35.9* 34.2* 34.4* 34.1* 33.4* 35.7*  MCV 101.4* 99.4 98.6 100.3* 99.7 100.0  PLT 166 164 180 174 179 176    Cardiac Enzymes: Recent Labs  Lab 04/12/19 1513 04/13/19 0629 04/14/19 0805 04/15/19 0557 04/16/19 0535 04/17/19 0409  CKTOTAL  --  81 86 98 75 75  TROPONINI 0.26*  --   --   --   --   --     BNP: Invalid input(s): POCBNP  CBG: Recent Labs  Lab 04/17/19 1707 04/17/19 2134 04/18/19 0625 04/18/19 1234 04/18/19 1648  GLUCAP 181* 211* 203* 121* 151*    Microbiology: Results for orders placed or performed during the hospital encounter of 04/12/19  Urine Culture     Status: None   Collection Time: 04/15/19  1:47 AM  Result Value Ref Range Status   Specimen Description URINE, CLEAN CATCH  Final   Special Requests NONE  Final   Culture    Final    NO GROWTH Performed at [facility], [place] [address] [place], [place] [address]    Report Status 04/16/2019 FINAL  Final  Culture, blood (routine x 2)     Status: None (Preliminary result)   Collection Time: 04/17/19  8:50 AM  Result Value Ref Range Status   Specimen Description BLOOD LEFT HAND  Final   Special Requests   Final    BOTTLES DRAWN AEROBIC ONLY Blood Culture adequate volume   Culture   Final    NO GROWTH 1 DAY Performed at [facility], [place] [address], [place], [place] [address]    Report Status PENDING  Incomplete  Culture, blood (routine x 2)     Status: None (Preliminary result)   Collection Time: 04/17/19  8:55 AM  Result Value Ref Range Status   Specimen Description BLOOD LEFT HAND  Final   Special Requests   Final    BOTTLES DRAWN AEROBIC ONLY Blood Culture adequate volume   Culture   Final    NO GROWTH 1 DAY Performed at [facility], [place] [address] [place], [place] [address]    Report Status PENDING  Incomplete    Coagulation Studies: No results for input(s): LABPROT, INR in the last 72 hours.  Urinalysis: No results for input(s): COLORURINE, LABSPEC, PHURINE, GLUCOSEU, HGBUR, BILIRUBINUR, KETONESUR, PROTEINUR, UROBILINOGEN, NITRITE, LEUKOCYTESUR in the last 72 hours.  Invalid input(s): APPERANCEUR    Imaging: No results found.   Medications:   . sodium chloride    . sodium chloride     . atorvastatin  40 mg Oral QHS  . benzonatate  100 mg Oral TID  . carvedilol  6.25 mg Oral BID WC  . Chlorhexidine Gluconate Cloth  6 each Topical Q0600  . cloNIDine  0.1 mg Oral Daily  . clopidogrel  75 mg Oral Daily  . docusate sodium  100 mg Oral BID  . dorzolamide-timolol  1 drop Both Eyes BID  . famotidine  20 mg Oral Daily  . heparin  5,000 Units Subcutaneous Q8H  . insulin aspart  0-15 Units Subcutaneous TID WC  . insulin detemir  10 Units Subcutaneous QHS  . Ipratropium-Albuterol  1 puff Inhalation Q6H  . pantoprazole   40 mg Oral BID  . sodium chloride flush  3 mL Intravenous Q12H  . vitamin C  500 mg Oral Daily  . zinc sulfate  220 mg Oral Daily   sodium chloride, sodium chloride, acetaminophen, alteplase, alum & mag hydroxide-simeth, chlorpheniramine-HYDROcodone, guaiFENesin-dextromethorphan, heparin, heparin,  hydrOXYzine, lidocaine (PF), lidocaine-prilocaine, ondansetron **OR** ondansetron (ZOFRAN) IV, oxyCODONE-acetaminophen, pentafluoroprop-tetrafluoroeth, zolpidem  Assessment/ Plan:   End-stage renal disease Monday Wednesday Friday tolerated dialysis 04/16/2019 next dialysis treatment planned for 04/21/2019 continues Monday Wednesday Friday treatments  Anemia no indication for ESA hemoglobin stable  Diabetes mellitus appears to be well controlled  Hypertension we will titrate clonidine to 0.1 mg daily due to lowish blood pressures  Metabolic acidosis resolved  Bones will will follow calcium phosphorus appear to be acceptable at this point  Pneumonia secondary to COVID-19 stable requiring 1 L nasal cannula   LOS: 7 Sherril Croon @TODAY @9 :07 AM

## 2019-04-19 NOTE — Progress Notes (Signed)
CSW called and spoke with Deneise Lever, Conservation officer, historic buildings at [facility]. She is unable to give the CSW an answer if they will be able to accept the patient. She suggested calling back on Monday once the admissions director was back in the office to see if they will be able to accept the patient.   CSW will continue to assist with disposition planning.   Domenic Schwab, MSW, [place]

## 2019-04-19 NOTE — Progress Notes (Signed)
CSW called and left a message with Jackelyn Poling at [facility] asking for a call back.   CSW called [facility], they are not taking COVID+ patients at this time.   CSW will continue to assist with finding a placement.   Domenic Schwab, MSW, [place]

## 2019-04-20 DIAGNOSIS — Z515 Encounter for palliative care: Secondary | ICD-10-CM

## 2019-04-20 DIAGNOSIS — E1169 Type 2 diabetes mellitus with other specified complication: Secondary | ICD-10-CM

## 2019-04-20 DIAGNOSIS — Z7189 Other specified counseling: Secondary | ICD-10-CM

## 2019-04-20 DIAGNOSIS — U071 COVID-19: Secondary | ICD-10-CM

## 2019-04-20 LAB — RENAL FUNCTION PANEL
Albumin: 2.3 g/dL — ABNORMAL LOW (ref 3.5–5.0)
Anion gap: 15 (ref 5–15)
BUN: 41 mg/dL — ABNORMAL HIGH (ref 8–23)
CO2: 26 mmol/L (ref 22–32)
Calcium: 7.9 mg/dL — ABNORMAL LOW (ref 8.9–10.3)
Chloride: 95 mmol/L — ABNORMAL LOW (ref 98–111)
Creatinine, Ser: 9.01 mg/dL — ABNORMAL HIGH (ref 0.61–1.24)
GFR calc Af Amer: 6 mL/min — ABNORMAL LOW (ref >60)
GFR calc non Af Amer: 5 mL/min — ABNORMAL LOW (ref >60)
Glucose, Bld: 181 mg/dL — ABNORMAL HIGH (ref 70–99)
Phosphorus: 3.9 mg/dL (ref 2.5–4.6)
Potassium: 4.1 mmol/L (ref 3.5–5.1)
Sodium: 136 mmol/L (ref 135–145)

## 2019-04-20 LAB — GLUCOSE, CAPILLARY
Glucose-Capillary: 166 mg/dL — ABNORMAL HIGH (ref 70–99)
Glucose-Capillary: 193 mg/dL — ABNORMAL HIGH (ref 70–99)
Glucose-Capillary: 191 mg/dL — ABNORMAL HIGH (ref 70–99)
Glucose-Capillary: 305 mg/dL — ABNORMAL HIGH (ref 70–99)

## 2019-04-20 LAB — C-REACTIVE PROTEIN: CRP: 19.3 mg/dL — ABNORMAL HIGH (ref <1.0)

## 2019-04-20 LAB — D-DIMER, QUANTITATIVE: D-Dimer, Quant: 1.48 ug/mL-FEU — ABNORMAL HIGH (ref 0.00–0.50)

## 2019-04-20 LAB — FERRITIN: Ferritin: 3404 ng/mL — ABNORMAL HIGH (ref 24–336)

## 2019-04-20 MED ORDER — OXYCODONE-ACETAMINOPHEN 5-325 MG PO TABS
1.0000 | ORAL_TABLET | ORAL | Status: DC | PRN
  Administered 2019-04-28 – 2019-05-04 (×5): 1 via ORAL
  Filled 2019-04-20 (×5): qty 1

## 2019-04-20 MED ORDER — HEPARIN SODIUM (PORCINE) 5000 UNIT/ML IJ SOLN
7500.0000 [IU] | Freq: Three times a day (TID) | INTRAMUSCULAR | Status: DC
  Administered 2019-04-20 – 2019-05-12 (×63): 7500 [IU] via SUBCUTANEOUS
  Filled 2019-04-20 (×64): qty 2

## 2019-04-20 MED ORDER — SIMETHICONE 80 MG PO CHEW
80.0000 mg | CHEWABLE_TABLET | Freq: Four times a day (QID) | ORAL | Status: DC | PRN
  Administered 2019-04-20 – 2019-05-11 (×10): 80 mg via ORAL
  Filled 2019-04-20 (×10): qty 1

## 2019-04-20 MED ORDER — CHLORHEXIDINE GLUCONATE CLOTH 2 % EX PADS: 6.0000 | MEDICATED_PAD | Freq: Every day | CUTANEOUS | Status: DC

## 2019-04-20 MED ORDER — BUTALBITAL-APAP-CAFFEINE 50-325-40 MG PO TABS
1.0000 | ORAL_TABLET | ORAL | Status: DC | PRN
  Administered 2019-05-06: 1 via ORAL
  Filled 2019-04-20: qty 1

## 2019-04-20 MED ORDER — METHYLPREDNISOLONE SODIUM SUCC 125 MG IJ SOLR
60.0000 mg | Freq: Every day | INTRAMUSCULAR | Status: DC
  Administered 2019-04-20 – 2019-04-24 (×4): 60 mg via INTRAVENOUS
  Filled 2019-04-20 (×4): qty 2

## 2019-04-20 NOTE — Consult Note (Signed)
Consultation Note Date: 04/20/2019   Patient Name: Cory Miller  DOB: 28-Oct-1941  MRN: [id number]  Age / Sex: 78 y.o., male  PCP: [name], MD Referring Physician: Lavina Hamman, MD  Reason for Consultation: Establishing goals of care  HPI/Patient Profile: 78 y.o. male  with past medical history of CKD- on dialysis, DM, very HOH, CAD s/p CABG, GERD admitted on 04/12/2019 with COVID-19 viral infection. Currently his D-dimer, ferritin, and CRP are trending up to levels that are poor prognostic indicators for patient's with COVID and comorbidities and high indicators of possible mortality. Palliative medicine consulted for [place].   Clinical Assessment and Goals of Care: Evaluated patient- he was lethargic, but aroused easily. Had not eaten any bites of his lunch tray.   Patient very [place], communication was complicated due to having to be done via written questions and responses, however, he was oriented to his situation- having discussed with Dr. Posey Pronto earlier.   Patient expressed his family and church as important values in his life. He continues to be hopeful for recovery back to his baseline- prior to admission he was living alone, independent at home.  He expressed worries regarding his illness and trajectory. Emotional support was provided. He asks for "for some medicine for gas".  We discussed his preferences for intubation and CPR. He stated he would like to be intubated if his respiratory status worsened. He stated he would want resuscitation with CPR if he was found in cardiac arrest or in a state where he had died. However, if he were on a ventilator and his physician's did not think he would improve or come off the ventilator he states he would rather, "Just go on, if its time to go".   His sister- Estill Dooms is main decision maker for patient if he were unable.   Primary Decision  Maker PATIENT    SUMMARY OF RECOMMENDATIONS -Patient is at high risk for decompensating -He wishes for continued aggressive care for now, intubation and CPR, however, he would not want prolonged intubation or prolonged artifical measures to keep him alive if physicians' felt he would not recover -He agrees to continued followup from [place]:  Full code  Prognosis:    Unable to determine- current indicators are poor   Primary Diagnoses: Present on Admission:  End stage renal disease ([place])  HTN (hypertension)  Hypokalemia  Anemia of chronic disease  COVID-19 virus infection   I have reviewed the medical record, interviewed the patient and family, and examined the patient. The following aspects are pertinent.  Past Medical History:  Diagnosis Date   Blood dyscrasia    prostate   CAD (coronary artery disease) of artery bypass graft    Carotid atherosclerosis    Carotid stenosis    Chronic kidney disease    CKD (chronic kidney disease), stage V (HCC)    CVA (cerebrovascular accident) ([place])    Diabetes mellitus without complication ([place])    Dyspnea    ED (erectile dysfunction)  Elevated troponin I level    Esophageal ulcer    GERD (gastroesophageal reflux disease)    Gout    Hard of hearing    Pt very hard  of hearing   Hyperlipidemia    Hypertension    MI (myocardial infarction) ([place])    Neuropathy    Occlusion of both internal carotid arteries    Prostate CA ([place])    Sleep apnea    Social History   Socioeconomic History   Marital status: Divorced    Spouse name: Not on file   Number of children: Not on file   Years of education: Not on file   Highest education level: Not on file  Occupational History   Not on file  Social Needs   Financial resource strain: Not on file   Food insecurity:    Worry: Not on file    Inability: Not on file   Transportation needs:    Medical: Not on file     Non-medical: Not on file  Tobacco Use   Smoking status: Former Smoker    Types: Cigarettes   Smokeless tobacco: Never Used  Substance and Sexual Activity   Alcohol use: Not Currently    Alcohol/week: 0.0 standard drinks   Drug use: No   Sexual activity: Not on file  Lifestyle   Physical activity:    Days per week: Not on file    Minutes per session: Not on file   Stress: Not on file  Relationships   Social connections:    Talks on phone: Not on file    Gets together: Not on file    Attends religious service: Not on file    Active member of club or organization: Not on file    Attends meetings of clubs or organizations: Not on file    Relationship status: Not on file  Other Topics Concern   Not on file  Social History Narrative   Not on file   Family History  Problem Relation Age of Onset   CAD Other    Diabetes Other    Cancer Other    Stroke Other    Scheduled Meds:  atorvastatin  40 mg Oral QHS   benzonatate  100 mg Oral TID   carvedilol  6.25 mg Oral BID WC   Chlorhexidine Gluconate Cloth  6 each Topical Q0600   cloNIDine  0.1 mg Oral Daily   clopidogrel  75 mg Oral Daily   docusate sodium  100 mg Oral BID   dorzolamide-timolol  1 drop Both Eyes BID   famotidine  20 mg Oral Daily   heparin  5,000 Units Subcutaneous Q8H   insulin aspart  0-15 Units Subcutaneous TID WC   insulin detemir  10 Units Subcutaneous QHS   Ipratropium-Albuterol  1 puff Inhalation Q6H   methylPREDNISolone (SOLU-MEDROL) injection  60 mg Intravenous Daily   pantoprazole  40 mg Oral BID   sodium chloride flush  3 mL Intravenous Q12H   vitamin C  500 mg Oral Daily   zinc sulfate  220 mg Oral Daily   Continuous Infusions:  sodium chloride     sodium chloride     PRN Meds:.sodium chloride, sodium chloride, acetaminophen, alteplase, alum & mag hydroxide-simeth, butalbital-acetaminophen-caffeine, chlorpheniramine-HYDROcodone,  guaiFENesin-dextromethorphan, heparin, heparin, hydrOXYzine, lidocaine (PF), lidocaine-prilocaine, ondansetron **OR** ondansetron (ZOFRAN) IV, oxyCODONE-acetaminophen, pentafluoroprop-tetrafluoroeth, simethicone, zolpidem Medications Prior to Admission:  Prior to Admission medications   Medication Sig Start Date End Date Taking? Authorizing Provider  hydrOXYzine (ATARAX/VISTARIL) 25 MG  tablet Take 25 mg by mouth 4 (four) times daily as needed for itching.    Yes [provider]  allopurinol (ZYLOPRIM) 100 MG tablet Take 100 mg by mouth daily.    [provider]  amLODipine (NORVASC) 10 MG tablet Take 10 mg by mouth daily.    [provider]  aspirin EC 81 MG tablet Take 81 mg by mouth daily.    [provider]  atorvastatin (LIPITOR) 40 MG tablet Take 40 mg by mouth at bedtime.    [provider]  carvedilol (COREG) 6.25 MG tablet Take 6.25 mg by mouth 2 (two) times daily with a meal.  09/25/17   [provider]  cholecalciferol (VITAMIN D) 1000 units tablet Take 1,000 Units by mouth daily.     [provider]  cloNIDine (CATAPRES) 0.1 MG tablet Take 1 tablet (0.1 mg total) by mouth 3 (three) times daily. 08/27/18   Vaughan Basta, MD  clopidogrel (PLAVIX) 75 MG tablet Take 75 mg by mouth daily.    [provider]  colchicine 0.6 MG tablet Take 0.6 mg by mouth every other day.     [provider]  docusate sodium (COLACE) 100 MG capsule Take 100 mg by mouth 2 (two) times daily.    [provider]  dorzolamide-timolol (COSOPT) 22.3-6.8 MG/ML ophthalmic solution Place 1 drop into both eyes 2 (two) times daily.  02/28/18   [provider]  enalapril (VASOTEC) 2.5 MG tablet Take 1 tablet (2.5 mg total) by mouth daily. 08/28/18   Vaughan Basta, MD  Flaxseed, Linseed, (EQL FLAXSEED OIL) 1200 MG CAPS Take 1,200 mg by mouth daily.    [provider]  hydrALAZINE (APRESOLINE) 100 MG  tablet Take 100 mg by mouth 3 (three) times daily.     [provider]  insulin aspart (NOVOLOG) 100 UNIT/ML FlexPen Inject 10 Units into the skin 3 (three) times daily with meals.     [provider]  insulin detemir (LEVEMIR) 100 UNIT/ML injection Inject 0.1 mLs (10 Units total) into the skin daily. Patient taking differently: Inject 10 Units into the skin at bedtime.  08/28/18   Vaughan Basta, MD  isosorbide mononitrate (IMDUR) 120 MG 24 hr tablet Take 120 mg by mouth daily.    [provider]  ketoconazole (NIZORAL) 2 % shampoo Apply 1 application topically 2 (two) times a week.  11/30/17   [provider]  multivitamin (RENA-VIT) TABS tablet Take 1 tablet by mouth at bedtime. 10/23/18   Fritzi Mandes, MD  nitroGLYCERIN (NITROSTAT) 0.4 MG SL tablet Place 0.4 mg under the tongue every 5 (five) minutes as needed for chest pain.  12/05/18   [provider]  oxyCODONE-acetaminophen (PERCOCET/ROXICET) 5-325 MG per tablet Take 1 tablet by mouth 3 (three) times daily.    [provider]  pantoprazole (PROTONIX) 40 MG tablet Take 1 tablet (40 mg total) by mouth daily. 40 mg BID for 1 month followed by once daily Patient taking differently: Take 40 mg by mouth 2 (two) times daily.  08/06/15   Max Sane, MD  simethicone (MYLICON) 80 MG chewable tablet Chew 2 tablets (160 mg total) by mouth 4 (four) times daily as needed for flatulence. Patient not taking: Reported on 12/24/2018 08/27/18   Vaughan Basta, MD  sodium bicarbonate 650 MG tablet Take 650 mg by mouth 2 (two) times daily.    [provider]  SPS 15 GM/60ML suspension Take 60 mLs by mouth as directed. When eating potassium  rich foods 06/05/18   [provider]  torsemide (DEMADEX) 20 MG tablet Take 2 tablets (40 mg total) by mouth daily. 10/23/18   Fritzi Mandes, MD  triamcinolone cream (KENALOG) 0.1 % Apply 1 application topically 2 (two) times daily as needed (ITCHING).      [provider]   No Known Allergies Review of Systems  Constitutional: Positive for fatigue.  Respiratory: Positive for shortness of breath.   Gastrointestinal: Positive for abdominal pain.    Physical Exam Vitals signs and nursing note reviewed.  Constitutional:      Appearance: He is ill-appearing.  Cardiovascular:     Rate and Rhythm: Normal rate and regular rhythm.  Pulmonary:     Comments: Increased RR Skin:    General: Skin is warm and dry.  Neurological:     Mental Status: He is oriented to person, place, and time.     Vital Signs: BP (!) 145/61 (BP Location: Left Arm)    Pulse 92    Temp 98.9 F (37.2 C) (Oral)    Resp (!) 31    Ht 6\' 1"  (1.854 m)    Wt 115 kg    SpO2 95%    BMI 33.45 kg/m  Pain Scale: 0-10 POSS *See Group Information*: 1-Acceptable,Awake and alert Pain Score: 0-No pain   SpO2: SpO2: 95 % O2 Device:SpO2: 95 % O2 Flow Rate: .O2 Flow Rate (L/min): 4 L/min  IO: Intake/output summary:   Intake/Output Summary (Last 24 hours) at 04/20/2019 1348 Last data filed at 04/20/2019 0354 Gross per 24 hour  Intake 240 ml  Output --  Net 240 ml    LBM: Last BM Date: 04/19/19 Baseline Weight: Weight: 117.9 kg Most recent weight: Weight: 115 kg     Palliative Assessment/Data: PPS: 30%     Thank you for this consult. Palliative medicine will continue to follow and assist as needed.   Time In: 6568 Time Out: 1400 Time Total: 75 minutes Greater than 50%  of this time was spent counseling and coordinating care related to the above assessment and plan.  Signed by: Mariana Kaufman, AGNP-C Palliative Medicine    Please contact Palliative Medicine Team phone at [phone] for questions and concerns.  For individual provider: See Shea Evans

## 2019-04-20 NOTE — Progress Notes (Signed)
[facility] ROUNDING NOTE   Subjective:   Summary this is a 78 year old gentleman with hypertension hyperlipidemia history of coronary artery disease status post CABG Monday Wednesday Friday end-stage renal disease.  He is also diabetic history of CVA and gastroesophageal reflux disease.  He presents [facility] if he is cough chills for 2 weeks.  He was COVID positive.  And is requiring oxygen supplementation to prevent hypoxic respiratory failure.  Underwent successful dialysis treatment with removal of 2.7 L 04/18/2019  atorvastatin 40 mg a day Coreg to 6.25 mg twice daily clonidine 0.1 mg  daily,  daily insulin 10 units subcu Levemir and sliding scale, Protonix 40 mg twice daily , Plavix 75 mg daily.,  Combivent inhaler 1 puff every 6 hours.  Blood pressure 139/51 pulse 92 temperature 98.9 O2 sats 98% 4 L nasal cannula  Sodium 136 potassium 4.1 chloride 95 CO2 26 BUN 41 creatinine 9 glucose 181, calcium 7.9 phosphorus 3.9 albumin 2.3.  2D echo EF 55 to 60% 10/18/2018   Objective:  Vital signs in last 24 hours:  Temp:  [98.2 F (36.8 C)-98.9 F (37.2 C)] 98.9 F (37.2 C) (05/24 0823) Pulse Rate:  [83-92] 92 (05/24 0904) Resp:  [22-34] 31 (05/24 0823) BP: (105-138)/(52-60) 138/54 (05/24 0823) SpO2:  [88 %-99 %] 97 % (05/24 0823)  Weight change:  Filed Weights   04/17/19 0500 04/18/19 0500 04/18/19 1035  Weight: 116.7 kg 114.3 kg 115 kg    Intake/Output: I/O last 3 completed shifts: In: 443 [P.O.:440; I.V.:3] Out: -    Intake/Output this shift:  No intake/output data recorded.  Physical examination deferred in light of COVID-19 medication strategies discussed with RN in detail trace edema normal work of breathing.  D[place] [phone]  MWF, [id number] AVF 4 hours EDW 116kg 8H/8EX   Basic Metabolic Panel: Recent Labs  Lab 04/14/19 0805 04/15/19 0557 04/16/19 0535 04/17/19 0409 04/18/19 0653 04/19/19 0708  04/20/19 0516  NA 136 136 135 138 136 136 136  K 3.6 4.1 3.7 4.3 4.6 4.2 4.1  CL 95* 94* 94* 101 98 95* 95*  CO2 26 27 26 24 24 25 26   GLUCOSE 171* 215* 261* 153* 198* 203* 181*  BUN 56* 34* 53* 28* 45* 24* 41*  CREATININE 6.28* 5.02* 7.22* 5.42* 8.13* 6.75* 9.01*  CALCIUM 6.9* 7.4* 7.2* 7.8* 7.5* 7.9* 7.9*  MG 1.3* 1.5* 1.5* 1.6*  --   --   --   PHOS 3.8 3.2 4.2 3.3 4.1 4.1 3.9    Liver Function Tests: Recent Labs  Lab 04/14/19 0805 04/15/19 0557 04/16/19 0535 04/17/19 0409 04/18/19 0653 04/19/19 0708 04/20/19 0516  AST 23 31 33 49*  --   --   --   ALT 21 21 20 28   --   --   --   ALKPHOS 113 119 120 164*  --   --   --   BILITOT 1.1 0.9 0.6 1.0  --   --   --   PROT 6.2* 6.4* 5.9* 6.5  --   --   --   ALBUMIN 2.8* 2.7* 2.5* 2.6* 2.4* 2.2* 2.3*   No results for input(s): LIPASE, AMYLASE in the last 168 hours. No results for input(s): AMMONIA in the last 168 hours.  CBC: Recent Labs  Lab 04/14/19 0805 04/14/19 1410 04/15/19 0557 04/16/19 0535 04/17/19 0409  WBC 3.8* 4.1 4.0 3.9* 4.3  NEUTROABS 2.3  --  2.9 2.9 2.8  HGB 10.9* 11.2* 10.8* 10.7*  11.3*  HCT 34.2* 34.4* 34.1* 33.4* 35.7*  MCV 99.4 98.6 100.3* 99.7 100.0  PLT 164 180 174 179 176    Cardiac Enzymes: Recent Labs  Lab 04/14/19 0805 04/15/19 0557 04/16/19 0535 04/17/19 0409  CKTOTAL 86 98 75 75    BNP: Invalid input(s): POCBNP  CBG: Recent Labs  Lab 04/18/19 1648 04/19/19 0857 04/19/19 1220 04/19/19 1649 04/20/19 0820  GLUCAP 151* 198* 174* 202* 166*    Microbiology: Results for orders placed or performed during the hospital encounter of 04/12/19  Urine Culture     Status: None   Collection Time: 04/15/19  1:47 AM  Result Value Ref Range Status   Specimen Description URINE, CLEAN CATCH  Final   Special Requests NONE  Final   Culture   Final    NO GROWTH Performed at [facility], [place] [address], [place], [place] [address]    Report Status 04/16/2019 FINAL  Final  Culture,  blood (routine x 2)     Status: None (Preliminary result)   Collection Time: 04/17/19  8:50 AM  Result Value Ref Range Status   Specimen Description BLOOD LEFT HAND  Final   Special Requests   Final    BOTTLES DRAWN AEROBIC ONLY Blood Culture adequate volume   Culture   Final    NO GROWTH 2 DAYS Performed at [facility], [place] [address] [place], [place] [address]    Report Status PENDING  Incomplete  Culture, blood (routine x 2)     Status: None (Preliminary result)   Collection Time: 04/17/19  8:55 AM  Result Value Ref Range Status   Specimen Description BLOOD LEFT HAND  Final   Special Requests   Final    BOTTLES DRAWN AEROBIC ONLY Blood Culture adequate volume   Culture   Final    NO GROWTH 2 DAYS Performed at [facility], [place] [address] [place], [place] [address]    Report Status PENDING  Incomplete    Coagulation Studies: No results for input(s): LABPROT, INR in the last 72 hours.  Urinalysis: No results for input(s): COLORURINE, LABSPEC, PHURINE, GLUCOSEU, HGBUR, BILIRUBINUR, KETONESUR, PROTEINUR, UROBILINOGEN, NITRITE, LEUKOCYTESUR in the last 72 hours.  Invalid input(s): APPERANCEUR    Imaging: Dg Chest Port 1 View  Result Date: 04/19/2019 CLINICAL DATA:  Shortness of breath EXAM: PORTABLE CHEST 1 VIEW COMPARISON:  Chest x-ray dated 04/17/2019 and chest x-ray dated 10/19/2018. FINDINGS: Stable cardiomegaly. Median sternotomy wires appear stable in alignment. Persistent bibasilar opacities, likely atelectasis and/or small pleural effusions. No new lung findings. IMPRESSION: Stable chest x-ray. Persistent bibasilar opacities, likely atelectasis and/or small pleural effusions. Electronically Signed   By: Franki Cabot M.D.   On: 04/19/2019 13:09   Dg Abd Portable 1v  Result Date: 04/19/2019 CLINICAL DATA:  RIGHT lower quadrant abdominal pain. EXAM: PORTABLE ABDOMEN - 1 VIEW COMPARISON:  None. FINDINGS: Visualized bowel gas pattern is nonobstructive. No  evidence of soft tissue mass or abnormal fluid collection. No evidence of free intraperitoneal air. No evidence of renal or ureteral calculi. Osseous structures are unremarkable. IMPRESSION: Negative. Electronically Signed   By: Franki Cabot M.D.   On: 04/19/2019 13:10     Medications:   . sodium chloride    . sodium chloride     . atorvastatin  40 mg Oral QHS  . benzonatate  100 mg Oral TID  . carvedilol  6.25 mg Oral BID WC  . Chlorhexidine Gluconate Cloth  6 each Topical  [id number]  . cloNIDine  0.1 mg Oral Daily  . clopidogrel  75 mg Oral Daily  . docusate sodium  100 mg Oral BID  . dorzolamide-timolol  1 drop Both Eyes BID  . famotidine  20 mg Oral Daily  . heparin  5,000 Units Subcutaneous Q8H  . insulin aspart  0-15 Units Subcutaneous TID WC  . insulin detemir  10 Units Subcutaneous QHS  . Ipratropium-Albuterol  1 puff Inhalation Q6H  . methylPREDNISolone (SOLU-MEDROL) injection  60 mg Intravenous Daily  . pantoprazole  40 mg Oral BID  . sodium chloride flush  3 mL Intravenous Q12H  . vitamin C  500 mg Oral Daily  . zinc sulfate  220 mg Oral Daily   sodium chloride, sodium chloride, acetaminophen, alteplase, alum & mag hydroxide-simeth, butalbital-acetaminophen-caffeine, chlorpheniramine-HYDROcodone, guaiFENesin-dextromethorphan, heparin, heparin, hydrOXYzine, lidocaine (PF), lidocaine-prilocaine, ondansetron **OR** ondansetron (ZOFRAN) IV, oxyCODONE-acetaminophen, pentafluoroprop-tetrafluoroeth, zolpidem  Assessment/ Plan:   End-stage renal disease Monday Wednesday Friday tolerated dialysis 04/16/2019 next dialysis treatment planned for 04/21/2019 continues Monday Wednesday Friday treatments  Anemia no indication for ESA hemoglobin stable  Diabetes mellitus appears to be well controlled  Hypertension titrated clonidine to 0.1 mg daily due to lowish blood pressures  Metabolic acidosis resolved  Bones will will follow calcium phosphorus appear to be acceptable at this  point  Pneumonia secondary to COVID-19 stable requiring 1 L nasal cannula   LOS: [place] @TODAY @11 :15 AM

## 2019-04-20 NOTE — Progress Notes (Signed)
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 Reports having fevers up to 101 F at home. Patient is not normally on oxygen, but found himself getting easily winded even with minimal exertion. Associated symptoms included sore throat, nasal congestion, diarrhea earlier in the week, and intermittent chest pain. He had continued to go to hemodialysis sessions and had a near complete dialysis session on 5/15. However, was sent to the emergency department for further evaluation due to symptoms. On admission patient was noted to blood pressures up to 191/71, pulse 107, respirations24, O2 saturations 94% on 2 L nasal cannula oxygen. Lab work revealed WBC 5.9, potassium 3.6, CO2 29, BUN 20, creatinine 3.09, and. Lactic acid 1.4. Chest x-ray showed chronic cardiomegaly with basilar congestion without any focal airspace opacity noted. COVID-19 testing was positive. Transfer was requested for need of hemodialysis and COVID-19 testing. Patient was accepted to a progressive bed here at [facility] for need of hemodialysis.   Assessment & Plan: Acute COVID-19 Viral illness Lab Results  Component Value Date   SARSCOV2NAA POSITIVE (A) 04/11/2019   RVP not done No lymphopenia, Low Procalcitonin CXR: hazy bilateral peripheral opacities  Recent Labs    04/18/19 0940 04/20/19 0516  DDIMER  --  1.48*  FERRITIN 2,580* 3,404*  CRP 13.6* 19.3*   Tmax:  99.6 Oxygen requirements: 4 LPM  Antibiotics:none Diuretics: none on HD Vitamin C and Zinc: Ordered DVT Prophylaxis: Subcutaneous Heparin  Dose increased due to worsening of  inflammatory markers Remdesivir: Contraindicated in ESRD patient Steroids: Starting at 1 mg/kg dose, continue until 04/27/2019 Actemra: Currently holding Prone positioning: Patient encouraged to stay in prone position as much as possible.  During this encounter: Patient Isolation: Droplet + Contact HCP PPE: CAPR, gown. gloves Patient PPE: None  The treatment plan and use of medications and known side effects were discussed with patient/family. It was clearly explained that there is no proven definitive treatment for COVID-19 infection yet. Any medications used here are based on case reports/anecdotal data which are not peer-reviewed and has not been studied using randomized control trials.  Complete risks and long-term side effects are unknown, however in the best clinical judgment they seem to be of some clinical benefit rather than medical risks.  Patient/family agree with the treatment plan and want to receive these treatments as indicated.   ESRD on HD:  Renal diet and carb modified with fluid restriction HD per nephrology  Possible UTI/asymptomatic bacteriuria:  UA urine positive for large leukocytes with >50 WBCs. UCx: Negative.  Completed antibiotics.  Second-degree heart block:  New finding on EKG when compared to previous tracings. Continue to monitor Consult cardiology if needed  Essential hypertension:  Blood pressure seen to be elevated up to 191/71. Continue Norvasc, Coreg, clonidine, torsemide and hydralazine.  Diabetes mellitus type 2 uncontrolled with hyperglycemia with renal complication Last hemoglobin A1c noted to be 6.9 in 09/2018. Hypoglycemic protocol Continue home Levemir 10 units at bedtime CBGs with moderate sliding scale insulin  Anemia of chronic disease:  Hemoglobin appears stable Continue to monitor  Hypokalemia:  Renal diet and carb modified with fluid restriction HD per nephrology  Deconditioning PT/OT consults recommends  SNF.  Indigestion. Continue Pepcid and add Maalox.  DVT prophylaxis: heparin Code Status: FULL had a prolonged goals of care discussion with this patient as well as his son.  Patient currently wants everything to be done. We will involve palliative care as the patient's condition will likely worsen further. Disposition Plan: TBD   Consultants:   Nephrology  Palliative care    Antimicrobials:   Rocephin    Subjective: Patient is hard of hearing their communication was made with the use of written notes.   Not feeling better.  Reports fatigue and tiredness.  Also on exam right-sided tenderness.  Objective: Vitals:   04/20/19 1206 04/20/19 1604 04/20/19 1634 04/20/19 1922  BP: (!) 145/61  (!) 136/56 132/78  Pulse: 92 91 95 93  Resp:   (!) 29   Temp:      TempSrc:      SpO2: 95%  91% 93%  Weight:      Height:        Intake/Output Summary (Last 24 hours) at 04/20/2019 1940 Last data filed at 04/20/2019 1600 Gross per 24 hour  Intake 600 ml  Output --  Net 600 ml   Filed Weights   04/17/19 0500 04/18/19 0500 04/18/19 1035  Weight: 116.7 kg 114.3 kg 115 kg    Examination:  General: 78 y.o. male resting in bed in NAD Cardiovascular: RRR, +S1, S2, no m/g/r, equal pulses throughout, RUE fistula Respiratory: decreased at bases, tachypnic, no accessory muscle use GI: BS+, NDNT, no masses noted, no organomegaly noted MSK: No e/c/c Skin: No rashes, bruises, ulcerations noted Neuro: A&O x 3, no focal deficits, very hard of hearing Psyc: Appropriate interaction and affect, calm/cooperative    Data Reviewed: I have personally reviewed following labs and imaging studies.  CBC: Recent Labs  Lab 04/14/19 0805 04/14/19 1410 04/15/19 0557 04/16/19 0535 04/17/19 0409  WBC 3.8* 4.1 4.0 3.9* 4.3  NEUTROABS 2.3  --  2.9 2.9 2.8  HGB 10.9* 11.2* 10.8* 10.7* 11.3*  HCT 34.2* 34.4* 34.1* 33.4* 35.7*  MCV 99.4 98.6 100.3* 99.7 100.0  PLT 164 180 174 179 710   Basic  Metabolic Panel: Recent Labs  Lab 04/14/19 0805 04/15/19 0557 04/16/19 0535 04/17/19 0409 04/18/19 0653 04/19/19 0708 04/20/19 0516  NA 136 136 135 138 136 136 136  K 3.6 4.1 3.7 4.3 4.6 4.2 4.1  CL 95* 94* 94* 101 98 95* 95*  CO2 26 27 26 24 24 25 26   GLUCOSE 171* 215* 261* 153* 198* 203* 181*  BUN 56* 34* 53* 28* 45* 24* 41*  CREATININE 6.28* 5.02* 7.22* 5.42* 8.13* 6.75* 9.01*  CALCIUM 6.9* 7.4* 7.2* 7.8* 7.5* 7.9* 7.9*  MG 1.3* 1.5* 1.5* 1.6*  --   --   --   PHOS 3.8 3.2 4.2 3.3 4.1 4.1 3.9   GFR: Estimated Creatinine Clearance: 9.1 mL/min (A) (by C-G formula based on SCr of 9.01 mg/dL (H)). Liver Function Tests: Recent Labs  Lab 04/14/19 0805 04/15/19 0557 04/16/19 0535 04/17/19 0409 04/18/19 0653 04/19/19 0708 04/20/19 0516  AST 23 31 33 49*  --   --   --   ALT 21 21 20 28   --   --   --   ALKPHOS 113 119 120 164*  --   --   --   BILITOT 1.1 0.9 0.6 1.0  --   --   --   PROT 6.2* 6.4* 5.9* 6.5  --   --   --  ALBUMIN 2.8* 2.7* 2.5* 2.6* 2.4* 2.2* 2.3*   No results for input(s): LIPASE, AMYLASE in the last 168 hours. No results for input(s): AMMONIA in the last 168 hours. Coagulation Profile: No results for input(s): INR, PROTIME in the last 168 hours. Cardiac Enzymes: Recent Labs  Lab 04/14/19 0805 04/15/19 0557 04/16/19 0535 04/17/19 0409  CKTOTAL 86 98 75 75   BNP (last 3 results) No results for input(s): PROBNP in the last 8760 hours. HbA1C: No results for input(s): HGBA1C in the last 72 hours. CBG: Recent Labs  Lab 04/19/19 1220 04/19/19 1649 04/20/19 0820 04/20/19 1224 04/20/19 1602  GLUCAP 174* 202* 166* 193* 191*   Lipid Profile: No results for input(s): CHOL, HDL, LDLCALC, TRIG, CHOLHDL, LDLDIRECT in the last 72 hours. Thyroid Function Tests: No results for input(s): TSH, T4TOTAL, FREET4, T3FREE, THYROIDAB in the last 72 hours. Anemia Panel: Recent Labs    04/18/19 0940 04/20/19 0516  FERRITIN 2,580* 3,404*   Sepsis Labs: No  results for input(s): PROCALCITON, LATICACIDVEN in the last 168 hours.  Recent Results (from the past 240 hour(s))  Culture, blood (Routine x 2)     Status: None   Collection Time: 04/11/19  7:36 PM  Result Value Ref Range Status   Specimen Description BLOOD LEFT ANTECUBITAL  Final   Special Requests   Final    BOTTLES DRAWN AEROBIC AND ANAEROBIC Blood Culture results may not be optimal due to an excessive volume of blood received in culture bottles   Culture   Final    NO GROWTH 5 DAYS Performed at [facility], [address] [place], [place] [address]    Report Status 04/16/2019 FINAL  Final  SARS Coronavirus 2 (CEPHEID- Performed in [place] hospital lab), Hosp Order     Status: Abnormal   Collection Time: 04/11/19  7:36 PM  Result Value Ref Range Status   SARS Coronavirus 2 POSITIVE (A) NEGATIVE Final    Comment: RESULT CALLED TO, READ BACK BY AND VERIFIED WITH: CALLED TO [place] @2121  04/11/2019 S[facility] (NOTE) If result is NEGATIVE SARS-CoV-2 target nucleic acids are NOT DETECTED. The SARS-CoV-2 RNA is generally detectable in upper and lower  respiratory specimens during the acute phase of infection. The lowest  concentration of SARS-CoV-2 viral copies this assay can detect is 250  copies / mL. A negative result does not preclude SARS-CoV-2 infection  and should not be used as the sole basis for treatment or other  patient management decisions.  A negative result may occur with  improper specimen collection / handling, submission of specimen other  than nasopharyngeal swab, presence of viral mutation(s) within the  areas targeted by this assay, and inadequate number of viral copies  (<250 copies / mL). A negative result must be combined with clinical  observations, patient history, and epidemiological information. If result is POSITIVE SARS-CoV-2 target nucleic acids are DETEC TED. The SARS-CoV-2 RNA is generally detectable in upper and lower  respiratory  specimens during the acute phase of infection.  Positive  results are indicative of active infection with SARS-CoV-2.  Clinical  correlation with patient history and other diagnostic information is  necessary to determine patient infection status.  Positive results do  not rule out bacterial infection or co-infection with other viruses. If result is PRESUMPTIVE POSTIVE SARS-CoV-2 nucleic acids MAY BE PRESENT.   A presumptive positive result was obtained on the submitted specimen  and confirmed on repeat testing.  While 2019 novel coronavirus  (SARS-CoV-2) nucleic acids  may be present in the submitted sample  additional confirmatory testing may be necessary for epidemiological  and / or clinical management purposes  to differentiate between  SARS-CoV-2 and other Sarbecovirus currently known to infect humans.  If clinically indicated additional testing with an alternate test  methodology (LAB7 453) is advised. The SARS-CoV-2 RNA is generally  detectable in upper and lower respiratory specimens during the acute  phase of infection. The expected result is Negative. Fact Sheet for Patients:  StrictlyIdeas.no Fact Sheet for Healthcare Providers: BankingDealers.co.za This test is not yet approved or cleared by the [place] FDA and has been authorized for detection and/or diagnosis of SARS-CoV-2 by FDA under an Emergency Use Authorization (EUA).  This EUA will remain in effect (meaning this test can be used) for the duration of the COVID-19 declaration under Section 564(b)(1) of the Act, 21 U.S.C. section 360bbb-3(b)(1), unless the authorization is terminated or revoked sooner. Performed at [facility], [address] [place], [place] [address]   Culture, blood (Routine x 2)     Status: None   Collection Time: 04/11/19  7:38 PM  Result Value Ref Range Status   Specimen Description BLOOD LEFT HAND  Final   Special Requests    Final    BOTTLES DRAWN AEROBIC AND ANAEROBIC Blood Culture adequate volume   Culture   Final    NO GROWTH 5 DAYS Performed at [facility], [address] [place], [place] [address]    Report Status 04/16/2019 FINAL  Final  Urine Culture     Status: None   Collection Time: 04/15/19  1:47 AM  Result Value Ref Range Status   Specimen Description URINE, CLEAN CATCH  Final   Special Requests NONE  Final   Culture   Final    NO GROWTH Performed at [facility], [place] [address], [place], [place] [address]    Report Status 04/16/2019 FINAL  Final  Culture, blood (routine x 2)     Status: None (Preliminary result)   Collection Time: 04/17/19  8:50 AM  Result Value Ref Range Status   Specimen Description BLOOD LEFT HAND  Final   Special Requests   Final    BOTTLES DRAWN AEROBIC ONLY Blood Culture adequate volume   Culture   Final    NO GROWTH 3 DAYS Performed at [facility], [place] [address], [place], [place] [address]    Report Status PENDING  Incomplete  Culture, blood (routine x 2)     Status: None (Preliminary result)   Collection Time: 04/17/19  8:55 AM  Result Value Ref Range Status   Specimen Description BLOOD LEFT HAND  Final   Special Requests   Final    BOTTLES DRAWN AEROBIC ONLY Blood Culture adequate volume   Culture   Final    NO GROWTH 3 DAYS Performed at [facility], [place] [address], [place],  [address]    Report Status PENDING  Incomplete         Radiology Studies: Dg Chest Port 1 View  Result Date: 04/19/2019 CLINICAL DATA:  Shortness of breath EXAM: PORTABLE CHEST 1 VIEW COMPARISON:  Chest x-ray dated 04/17/2019 and chest x-ray dated 10/19/2018. FINDINGS: Stable cardiomegaly. Median sternotomy wires appear stable in alignment. Persistent bibasilar opacities, likely atelectasis and/or small pleural effusions. No new lung findings. IMPRESSION: Stable chest x-ray. Persistent bibasilar opacities, likely atelectasis and/or small  pleural effusions. Electronically Signed   By: Roxy Horseman.D.  On: 04/19/2019 13:09   Dg Abd Portable 1v  Result Date: 04/19/2019 CLINICAL DATA:  RIGHT lower quadrant abdominal pain. EXAM: PORTABLE ABDOMEN - 1 VIEW COMPARISON:  None. FINDINGS: Visualized bowel gas pattern is nonobstructive. No evidence of soft tissue mass or abnormal fluid collection. No evidence of free intraperitoneal air. No evidence of renal or ureteral calculi. Osseous structures are unremarkable. IMPRESSION: Negative. Electronically Signed   By: Franki Cabot M.D.   On: 04/19/2019 13:10        Scheduled Meds:  atorvastatin  40 mg Oral QHS   benzonatate  100 mg Oral TID   carvedilol  6.25 mg Oral BID WC   Chlorhexidine Gluconate Cloth  6 each Topical Q0600   cloNIDine  0.1 mg Oral Daily   clopidogrel  75 mg Oral Daily   docusate sodium  100 mg Oral BID   dorzolamide-timolol  1 drop Both Eyes BID   famotidine  20 mg Oral Daily   heparin  7,500 Units Subcutaneous Q8H   insulin aspart  0-15 Units Subcutaneous TID WC   insulin detemir  10 Units Subcutaneous QHS   Ipratropium-Albuterol  1 puff Inhalation Q6H   methylPREDNISolone (SOLU-MEDROL) injection  60 mg Intravenous Daily   pantoprazole  40 mg Oral BID   sodium chloride flush  3 mL Intravenous Q12H   vitamin C  500 mg Oral Daily   zinc sulfate  220 mg Oral Daily   Continuous Infusions:  sodium chloride     sodium chloride       LOS: 8 days    Time spent: 35 minutes spent in the coordination of care today.   Berle Mull   If 7PM-7AM, please contact night-coverage [email or web address] Password [facility] 04/20/2019, 7:40 PM

## 2019-04-21 LAB — RENAL FUNCTION PANEL
Albumin: 2.2 g/dL — ABNORMAL LOW (ref 3.5–5.0)
Anion gap: 16 — ABNORMAL HIGH (ref 5–15)
BUN: 65 mg/dL — ABNORMAL HIGH (ref 8–23)
CO2: 25 mmol/L (ref 22–32)
Calcium: 8.1 mg/dL — ABNORMAL LOW (ref 8.9–10.3)
Chloride: 92 mmol/L — ABNORMAL LOW (ref 98–111)
Creatinine, Ser: 11.43 mg/dL — ABNORMAL HIGH (ref 0.61–1.24)
GFR calc Af Amer: 4 mL/min — ABNORMAL LOW (ref >60)
GFR calc non Af Amer: 4 mL/min — ABNORMAL LOW (ref >60)
Glucose, Bld: 434 mg/dL — ABNORMAL HIGH (ref 70–99)
Phosphorus: 4.5 mg/dL (ref 2.5–4.6)
Potassium: 4.6 mmol/L (ref 3.5–5.1)
Sodium: 133 mmol/L — ABNORMAL LOW (ref 135–145)

## 2019-04-21 LAB — GLUCOSE, CAPILLARY
Glucose-Capillary: 380 mg/dL — ABNORMAL HIGH (ref 70–99)
Glucose-Capillary: 162 mg/dL — ABNORMAL HIGH (ref 70–99)
Glucose-Capillary: 243 mg/dL — ABNORMAL HIGH (ref 70–99)
Glucose-Capillary: 254 mg/dL — ABNORMAL HIGH (ref 70–99)

## 2019-04-21 LAB — C-REACTIVE PROTEIN: CRP: 20.2 mg/dL — ABNORMAL HIGH (ref <1.0)

## 2019-04-21 LAB — CBC
HCT: 32.5 % — ABNORMAL LOW (ref 39.0–52.0)
Hemoglobin: 10.7 g/dL — ABNORMAL LOW (ref 13.0–17.0)
MCH: 32.3 pg (ref 26.0–34.0)
MCHC: 32.9 g/dL (ref 30.0–36.0)
MCV: 98.2 fL (ref 80.0–100.0)
Platelets: 325 10*3/uL (ref 150–400)
RBC: 3.31 MIL/uL — ABNORMAL LOW (ref 4.22–5.81)
RDW: 14.5 % (ref 11.5–15.5)
WBC: 7.4 10*3/uL (ref 4.0–10.5)
nRBC: 0.3 % — ABNORMAL HIGH (ref 0.0–0.2)

## 2019-04-21 LAB — D-DIMER, QUANTITATIVE: D-Dimer, Quant: 1.3 ug/mL-FEU — ABNORMAL HIGH (ref 0.00–0.50)

## 2019-04-21 LAB — FERRITIN: Ferritin: 3581 ng/mL — ABNORMAL HIGH (ref 24–336)

## 2019-04-21 LAB — LACTATE DEHYDROGENASE: LDH: 381 U/L — ABNORMAL HIGH (ref 98–192)

## 2019-04-21 MED ORDER — PENTAFLUOROPROP-TETRAFLUOROETH EX AERO: 1.0000 "application " | INHALATION_SPRAY | CUTANEOUS | Status: DC | PRN

## 2019-04-21 MED ORDER — LIDOCAINE-PRILOCAINE 2.5-2.5 % EX CREA: 1.0000 "application " | TOPICAL_CREAM | CUTANEOUS | Status: DC | PRN

## 2019-04-21 MED ORDER — SODIUM CHLORIDE 0.9 % IV SOLN: 100.0000 mL | INTRAVENOUS | Status: DC | PRN

## 2019-04-21 MED ORDER — LIDOCAINE HCL (PF) 1 % IJ SOLN: 5.0000 mL | INTRAMUSCULAR | Status: DC | PRN

## 2019-04-21 MED ORDER — ALTEPLASE 2 MG IJ SOLR: 2.0000 mg | Freq: Once | INTRAMUSCULAR | Status: DC | PRN

## 2019-04-21 MED ORDER — INSULIN ASPART 100 UNIT/ML ~~LOC~~ SOLN
6.0000 [IU] | Freq: Three times a day (TID) | SUBCUTANEOUS | Status: DC
  Administered 2019-04-21 (×2): 6 [IU] via SUBCUTANEOUS

## 2019-04-21 MED ORDER — HEPARIN SODIUM (PORCINE) 1000 UNIT/ML IJ SOLN
INTRAMUSCULAR | Status: AC
  Filled 2019-04-21: qty 3

## 2019-04-21 MED ORDER — HEPARIN SODIUM (PORCINE) 1000 UNIT/ML DIALYSIS: 1000.0000 [IU] | INTRAMUSCULAR | Status: DC | PRN

## 2019-04-21 NOTE — Progress Notes (Signed)
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 Reports having fevers up to 101 F at home. Patient is not normally on oxygen, but found himself getting easily winded even with minimal exertion. Associated symptoms included sore throat, nasal congestion, diarrhea earlier in the week, and intermittent chest pain. He had continued to go to hemodialysis sessions and had a near complete dialysis session on 5/15. However, was sent to the emergency department for further evaluation due to symptoms. On admission patient was noted to blood pressures up to 191/71, pulse 107, respirations24, O2 saturations 94% on 2 L nasal cannula oxygen. Lab work revealed WBC 5.9, potassium 3.6, CO2 29, BUN 20, creatinine 3.09, and. Lactic acid 1.4. Chest x-ray showed chronic cardiomegaly with basilar congestion without any focal airspace opacity noted. COVID-19 testing was positive. Transfer was requested for need of hemodialysis and COVID-19 testing. Patient was accepted to a progressive bed here at [facility] for need of hemodialysis.  Assessment & Plan: Acute COVID-19 Viral illness Lab Results  Component Value Date   SARSCOV2NAA POSITIVE (A) 04/11/2019   RVP not done No lymphopenia, Low Procalcitonin CXR: hazy bilateral peripheral opacities  Recent Labs    04/20/19 0516 04/21/19 0458  DDIMER 1.48* 1.30*  FERRITIN 3,404* 3,581*  LDH  --  381*  CRP 19.3* 20.2*   Tmax:  99.6 Oxygen requirements: 4 LPM  Antibiotics:none Diuretics: none on HD Vitamin C and Zinc: Ordered DVT Prophylaxis: Subcutaneous Heparin  Dose increased due  to worsening of inflammatory markers Remdesivir: Contraindicated in ESRD patient Steroids: Starting at 1 mg/kg dose, continue until 04/27/2019 Actemra: Currently holding Prone positioning: Patient encouraged to stay in prone position as much as possible.  During this encounter: Patient Isolation: Droplet + Contact HCP PPE: CAPR, gown. gloves Patient PPE: None  The treatment plan and use of medications and known side effects were discussed with patient/family. It was clearly explained that there is no proven definitive treatment for COVID-19 infection yet. Any medications used here are based on case reports/anecdotal data which are not peer-reviewed and has not been studied using randomized control trials.  Complete risks and long-term side effects are unknown, however in the best clinical judgment they seem to be of some clinical benefit rather than medical risks.  Patient/family agree with the treatment plan and want to receive these treatments as indicated.   ESRD on HD:  Renal diet and carb modified with fluid restriction HD per nephrology  Possible UTI/asymptomatic bacteriuria:  UA urine positive for large leukocytes with >50 WBCs. UCx: Negative.  Completed antibiotics.  Second-degree heart block:  New finding on EKG when compared to previous tracings. Continue to monitor Consult cardiology if needed  Essential hypertension:  Blood pressure seen to be elevated up to 191/71. Continue Norvasc, Coreg, clonidine, torsemide and hydralazine.  Diabetes mellitus type 2 uncontrolled with hyperglycemia with renal complication Last hemoglobin A1c noted to be 6.9 in 09/2018. Hypoglycemic protocol Continue home Levemir 10 units at bedtime CBGs with moderate sliding scale insulin  Anemia of chronic disease:  Hemoglobin appears stable Continue to monitor  Hypokalemia:  Renal diet and carb modified with fluid restriction HD per nephrology  Deconditioning PT/OT  consults  recommends SNF.  Indigestion. Continue Pepcid and add Maalox.  DVT prophylaxis: heparin Code Status: FULL had a prolonged goals of care discussion with this patient as well as his son.  Patient currently wants everything to be done. We will involve palliative care as the patient's condition will likely worsen further. Disposition Plan: TBD   Consultants:   Nephrology  Palliative care    Antimicrobials:  . Rocephin    Subjective: Patient is hard of hearing their communication was made with the use of written notes.   Not feeling better.  Reports fatigue and tiredness.  Also on exam right-sided tenderness.  Objective: Vitals:   04/21/19 1147 04/21/19 1200 04/21/19 1300 04/21/19 1652  BP:  (!) 153/61 (!) 146/107 (!) 119/52  Pulse:  80 87 88  Resp:  (!) 24 (!) 26   Temp: (!) 95.5 F (35.3 C)  (!) 95.7 F (35.4 C)   TempSrc: Axillary  Axillary   SpO2:  94% 96% 97%  Weight: 112.6 kg     Height:        Intake/Output Summary (Last 24 hours) at 04/21/2019 0569 Last data filed at 04/21/2019 1147 Gross per 24 hour  Intake 480 ml  Output 2493 ml  Net -2013 ml   Filed Weights   04/21/19 0536 04/21/19 0745 04/21/19 1147  Weight: 114.5 kg 114.9 kg 112.6 kg    Examination:  General: 78 y.o. male resting in bed in NAD Cardiovascular: RRR, +S1, S2, no m/g/r, equal pulses throughout, RUE fistula Respiratory: decreased at bases, tachypnic, no accessory muscle use GI: BS+, NDNT, no masses noted, no organomegaly noted MSK: No e/c/c Skin: No rashes, bruises, ulcerations noted Neuro: A&O x 3, no focal deficits, very hard of hearing Psyc: Appropriate interaction and affect, calm/cooperative    Data Reviewed: I have personally reviewed following labs and imaging studies.  CBC: Recent Labs  Lab 04/15/19 0557 04/16/19 0535 04/17/19 0409 04/21/19 0809  WBC 4.0 3.9* 4.3 7.4  NEUTROABS 2.9 2.9 2.8  --   HGB 10.8* 10.7* 11.3* 10.7*  HCT 34.1* 33.4* 35.7* 32.5*  MCV 100.3*  99.7 100.0 98.2  PLT 174 179 176 794   Basic Metabolic Panel: Recent Labs  Lab 04/15/19 0557 04/16/19 0535 04/17/19 0409 04/18/19 0653 04/19/19 0708 04/20/19 0516 04/21/19 0458  NA 136 135 138 136 136 136 133*  K 4.1 3.7 4.3 4.6 4.2 4.1 4.6  CL 94* 94* 101 98 95* 95* 92*  CO2 27 26 24 24 25 26 25   GLUCOSE 215* 261* 153* 198* 203* 181* 434*  BUN 34* 53* 28* 45* 24* 41* 65*  CREATININE 5.02* 7.22* 5.42* 8.13* 6.75* 9.01* 11.43*  CALCIUM 7.4* 7.2* 7.8* 7.5* 7.9* 7.9* 8.1*  MG 1.5* 1.5* 1.6*  --   --   --   --   PHOS 3.2 4.2 3.3 4.1 4.1 3.9 4.5   GFR: Estimated Creatinine Clearance: 7.1 mL/min (A) (by C-G formula based on SCr of 11.43 mg/dL (H)). Liver Function Tests: Recent Labs  Lab 04/15/19 0557 04/16/19 0535 04/17/19 0409 04/18/19 0653 04/19/19 0708 04/20/19 0516 04/21/19 0458  AST 31 33 49*  --   --   --   --   ALT 21 20 28   --   --   --   --   ALKPHOS 119 120 164*  --   --   --   --   BILITOT 0.9 0.6 1.0  --   --   --   --  PROT 6.4* 5.9* 6.5  --   --   --   --   ALBUMIN 2.7* 2.5* 2.6* 2.4* 2.2* 2.3* 2.2*   No results for input(s): LIPASE, AMYLASE in the last 168 hours. No results for input(s): AMMONIA in the last 168 hours. Coagulation Profile: No results for input(s): INR, PROTIME in the last 168 hours. Cardiac Enzymes: Recent Labs  Lab 04/15/19 0557 04/16/19 0535 04/17/19 0409  CKTOTAL 98 75 75   BNP (last 3 results) No results for input(s): PROBNP in the last 8760 hours. HbA1C: No results for input(s): HGBA1C in the last 72 hours. CBG: Recent Labs  Lab 04/20/19 1602 04/20/19 2104 04/21/19 0756 04/21/19 1341 04/21/19 1715  GLUCAP 191* 305* 380* 162* 243*   Lipid Profile: No results for input(s): CHOL, HDL, LDLCALC, TRIG, CHOLHDL, LDLDIRECT in the last 72 hours. Thyroid Function Tests: No results for input(s): TSH, T4TOTAL, FREET4, T3FREE, THYROIDAB in the last 72 hours. Anemia Panel: Recent Labs    04/20/19 0516 04/21/19 0458   FERRITIN 3,404* 3,581*   Sepsis Labs: No results for input(s): PROCALCITON, LATICACIDVEN in the last 168 hours.  Recent Results (from the past 240 hour(s))  Culture, blood (Routine x 2)     Status: None   Collection Time: 04/11/19  7:36 PM  Result Value Ref Range Status   Specimen Description BLOOD LEFT ANTECUBITAL  Final   Special Requests   Final    BOTTLES DRAWN AEROBIC AND ANAEROBIC Blood Culture results may not be optimal due to an excessive volume of blood received in culture bottles   Culture   Final    NO GROWTH 5 DAYS Performed at [facility], [address], [place], [place] [address]    Report Status 04/16/2019 FINAL  Final  SARS Coronavirus 2 (CEPHEID- Performed in [place] hospital lab), Hosp Order     Status: Abnormal   Collection Time: 04/11/19  7:36 PM  Result Value Ref Range Status   SARS Coronavirus 2 POSITIVE (A) NEGATIVE Final    Comment: RESULT CALLED TO, READ BACK BY AND VERIFIED WITH: CALLED TO [facility] @2121  04/11/2019 SAC (NOTE) If result is NEGATIVE SARS-CoV-2 target nucleic acids are NOT DETECTED. The SARS-CoV-2 RNA is generally detectable in upper and lower  respiratory specimens during the acute phase of infection. The lowest  concentration of SARS-CoV-2 viral copies this assay can detect is 250  copies / mL. A negative result does not preclude SARS-CoV-2 infection  and should not be used as the sole basis for treatment or other  patient management decisions.  A negative result may occur with  improper specimen collection / handling, submission of specimen other  than nasopharyngeal swab, presence of viral mutation(s) within the  areas targeted by this assay, and inadequate number of viral copies  (<250 copies / mL). A negative result must be combined with clinical  observations, patient history, and epidemiological information. If result is POSITIVE SARS-CoV-2 target nucleic acids are DETEC TED. The SARS-CoV-2 RNA is generally  detectable in upper and lower  respiratory specimens during the acute phase of infection.  Positive  results are indicative of active infection with SARS-CoV-2.  Clinical  correlation with patient history and other diagnostic information is  necessary to determine patient infection status.  Positive results do  not rule out bacterial infection or co-infection with other viruses. If result is PRESUMPTIVE POSTIVE SARS-CoV-2 nucleic acids MAY BE PRESENT.   A presumptive positive result was obtained on the submitted specimen  and confirmed on repeat testing.  While 2019 novel coronavirus  (SARS-CoV-2) nucleic acids may be present in the submitted sample  additional confirmatory testing may be necessary for epidemiological  and / or clinical management purposes  to differentiate between  SARS-CoV-2 and other Sarbecovirus currently known to infect humans.  If clinically indicated additional testing with an alternate test  methodology (LAB7 453) is advised. The SARS-CoV-2 RNA is generally  detectable in upper and lower respiratory specimens during the acute  phase of infection. The expected result is Negative. Fact Sheet for Patients:  StrictlyIdeas.no Fact Sheet for Healthcare Providers: BankingDealers.co.za This test is not yet approved or cleared by the [place] FDA and has been authorized for detection and/or diagnosis of SARS-CoV-2 by FDA under an Emergency Use Authorization (EUA).  This EUA will remain in effect (meaning this test can be used) for the duration of the COVID-19 declaration under Section 564(b)(1) of the Act, 21 U.S.C. section 360bbb-3(b)(1), unless the authorization is terminated or revoked sooner. Performed at [facility], [address] [place], [place] [address]   Culture, blood (Routine x 2)     Status: None   Collection Time: 04/11/19  7:38 PM  Result Value Ref Range Status   Specimen Description  BLOOD LEFT HAND  Final   Special Requests   Final    BOTTLES DRAWN AEROBIC AND ANAEROBIC Blood Culture adequate volume   Culture   Final    NO GROWTH 5 DAYS Performed at [facility], [address] [place], [place] [address]    Report Status 04/16/2019 FINAL  Final  Urine Culture     Status: None   Collection Time: 04/15/19  1:47 AM  Result Value Ref Range Status   Specimen Description URINE, CLEAN CATCH  Final   Special Requests NONE  Final   Culture   Final    NO GROWTH Performed at [facility], [place] [address], [place], [place] [address]    Report Status 04/16/2019 FINAL  Final  Culture, blood (routine x 2)     Status: None (Preliminary result)   Collection Time: 04/17/19  8:50 AM  Result Value Ref Range Status   Specimen Description BLOOD LEFT HAND  Final   Special Requests   Final    BOTTLES DRAWN AEROBIC ONLY Blood Culture adequate volume   Culture   Final    NO GROWTH 4 DAYS Performed at [facility], [place] [address] [place], [place] [address]    Report Status PENDING  Incomplete  Culture, blood (routine x 2)     Status: None (Preliminary result)   Collection Time: 04/17/19  8:55 AM  Result Value Ref Range Status   Specimen Description BLOOD LEFT HAND  Final   Special Requests   Final    BOTTLES DRAWN AEROBIC ONLY Blood Culture adequate volume   Culture   Final    NO GROWTH 4 DAYS Performed at [facility], [place] [address], [place], [place] [address]    Report Status PENDING  Incomplete         Radiology Studies: No results found.      Scheduled Meds: . atorvastatin  40 mg Oral QHS  . benzonatate  100 mg Oral TID  . carvedilol  6.25 mg Oral BID WC  . Chlorhexidine Gluconate Cloth  6 each Topical Q0600  . cloNIDine  0.1 mg Oral Daily  . clopidogrel  75 mg Oral Daily  . docusate sodium  100 mg Oral BID  . dorzolamide-timolol  1 drop Both Eyes BID  . famotidine  20 mg Oral Daily  . heparin      . heparin  7,500 Units  Subcutaneous Q8H  . insulin aspart  0-15 Units Subcutaneous TID WC  . insulin aspart  6 Units Subcutaneous TID WC  . insulin detemir  10 Units Subcutaneous QHS  . Ipratropium-Albuterol  1 puff Inhalation Q6H  . methylPREDNISolone (SOLU-MEDROL) injection  60 mg Intravenous Daily  . pantoprazole  40 mg Oral BID  . sodium chloride flush  3 mL Intravenous Q12H  . vitamin C  500 mg Oral Daily  . zinc sulfate  220 mg Oral Daily   Continuous Infusions: . sodium chloride    . sodium chloride       LOS: 9 days    Time spent: 35 minutes spent in the coordination of care today.   Berle Mull   If 7PM-7AM, please contact night-coverage [email or web address] Password [facility] 04/21/2019, 6:38 PM

## 2019-04-21 NOTE — Progress Notes (Signed)
MD on call paged regarding HS blood sugar of 301 mg/dl, N.N.O. received. Pt received his HS Levemir, no s/s of Hyperglycemia at this time. Will continue to monitor.

## 2019-04-21 NOTE — Progress Notes (Signed)
CSW received the following additional facilities from the patient's family.   CSW called the following:   [facility])- [phone]. Left message for admissions director Kieth Brightly) to call me back about accepting the patient.   [facility])- [phone] Admissions director not in and wasn't able to leave message due to the voicemail box being full. Try again on Tuesday.   [facility]one[facility] Caraway [facility])- [phone] Left messaged for admissions director.

## 2019-04-21 NOTE — Procedures (Signed)
I was present at this dialysis session. I have reviewed the session itself and made appropriate changes.   Vital signs in last 24 hours:  Temp:  [96 F (35.6 C)-98.9 F (37.2 C)] 96.4 F (35.8 C) (05/25 0745) Pulse Rate:  [51-100] 74 (05/25 0930) Resp:  [20-37] 25 (05/25 0930) BP: (119-167)/(52-135) 145/73 (05/25 0930) SpO2:  [87 %-100 %] 100 % (05/25 0915) Weight:  [114.5 kg-114.9 kg] 114.9 kg (05/25 0745) Weight change:  Filed Weights   04/18/19 1035 04/21/19 0536 04/21/19 0745  Weight: 115 kg 114.5 kg 114.9 kg    Recent Labs  Lab 04/21/19 0458  NA 133*  K 4.6  CL 92*  CO2 25  GLUCOSE 434*  BUN 65*  CREATININE 11.43*  CALCIUM 8.1*  PHOS 4.5    Recent Labs  Lab 04/15/19 0557 04/16/19 0535 04/17/19 0409 04/21/19 0809  WBC 4.0 3.9* 4.3 7.4  NEUTROABS 2.9 2.9 2.8  --   HGB 10.8* 10.7* 11.3* 10.7*  HCT 34.1* 33.4* 35.7* 32.5*  MCV 100.3* 99.7 100.0 98.2  PLT 174 179 176 325    Scheduled Meds: . atorvastatin  40 mg Oral QHS  . benzonatate  100 mg Oral TID  . carvedilol  6.25 mg Oral BID WC  . Chlorhexidine Gluconate Cloth  6 each Topical Q0600  . cloNIDine  0.1 mg Oral Daily  . clopidogrel  75 mg Oral Daily  . docusate sodium  100 mg Oral BID  . dorzolamide-timolol  1 drop Both Eyes BID  . famotidine  20 mg Oral Daily  . heparin      . heparin  7,500 Units Subcutaneous Q8H  . insulin aspart  0-15 Units Subcutaneous TID WC  . insulin aspart  6 Units Subcutaneous TID WC  . insulin detemir  10 Units Subcutaneous QHS  . Ipratropium-Albuterol  1 puff Inhalation Q6H  . methylPREDNISolone (SOLU-MEDROL) injection  60 mg Intravenous Daily  . pantoprazole  40 mg Oral BID  . sodium chloride flush  3 mL Intravenous Q12H  . vitamin C  500 mg Oral Daily  . zinc sulfate  220 mg Oral Daily   Continuous Infusions: . sodium chloride    . sodium chloride     PRN Meds:.sodium chloride, sodium chloride, acetaminophen, alteplase, alum & mag hydroxide-simeth,  butalbital-acetaminophen-caffeine, chlorpheniramine-HYDROcodone, guaiFENesin-dextromethorphan, heparin, heparin, hydrOXYzine, lidocaine (PF), lidocaine-prilocaine, ondansetron **OR** ondansetron (ZOFRAN) IV, oxyCODONE-acetaminophen, pentafluoroprop-tetrafluoroeth, simethicone, zolpidem    Dial[place]zes [place] [phone]  MWF, BFR400 DFR600 AVF 4 hours EDW 116kg 2K/3Ca  Assessment/Plan: 1. covid-19 pneumonia- on 2 L [place].  Repeat swab to see if he is clear/noninfectious  2. ESRD- cont with HD qMWF 3. Anemia of ESRD- cont with ESA 4. SHPTH- stable 5. Second degree heart block- per primary  Donetta Potts,  MD 04/21/2019, 9:50 AM

## 2019-04-21 NOTE — Progress Notes (Signed)
PT Cancellation Note  Patient Details Name: Cory Miller MRN: [id number] DOB: 01/03/1941   Cancelled Treatment:    Reason Eval/Treat Not Completed: Patient at procedure or test/unavailable. Pt receiving HD.   Shary Decamp [facility] 04/21/2019, 11:16 AM [place] Pager [phone] Office [phone]

## 2019-04-22 LAB — CULTURE, BLOOD (ROUTINE X 2)
Culture: NO GROWTH
Culture: NO GROWTH
Special Requests: ADEQUATE
Special Requests: ADEQUATE

## 2019-04-22 LAB — RENAL FUNCTION PANEL
Albumin: 2.6 g/dL — ABNORMAL LOW (ref 3.5–5.0)
Anion gap: 18 — ABNORMAL HIGH (ref 5–15)
BUN: 50 mg/dL — ABNORMAL HIGH (ref 8–23)
CO2: 25 mmol/L (ref 22–32)
Calcium: 8.6 mg/dL — ABNORMAL LOW (ref 8.9–10.3)
Chloride: 92 mmol/L — ABNORMAL LOW (ref 98–111)
Creatinine, Ser: 8.68 mg/dL — ABNORMAL HIGH (ref 0.61–1.24)
GFR calc Af Amer: 6 mL/min — ABNORMAL LOW (ref >60)
GFR calc non Af Amer: 5 mL/min — ABNORMAL LOW (ref >60)
Glucose, Bld: 264 mg/dL — ABNORMAL HIGH (ref 70–99)
Phosphorus: 3.8 mg/dL (ref 2.5–4.6)
Potassium: 4 mmol/L (ref 3.5–5.1)
Sodium: 135 mmol/L (ref 135–145)

## 2019-04-22 LAB — GLUCOSE, CAPILLARY
Glucose-Capillary: 252 mg/dL — ABNORMAL HIGH (ref 70–99)
Glucose-Capillary: 218 mg/dL — ABNORMAL HIGH (ref 70–99)
Glucose-Capillary: 353 mg/dL — ABNORMAL HIGH (ref 70–99)
Glucose-Capillary: 174 mg/dL — ABNORMAL HIGH (ref 70–99)

## 2019-04-22 LAB — FERRITIN: Ferritin: 3862 ng/mL — ABNORMAL HIGH (ref 24–336)

## 2019-04-22 LAB — C-REACTIVE PROTEIN: CRP: 9.6 mg/dL — ABNORMAL HIGH (ref <1.0)

## 2019-04-22 LAB — D-DIMER, QUANTITATIVE: D-Dimer, Quant: 1.38 ug/mL-FEU — ABNORMAL HIGH (ref 0.00–0.50)

## 2019-04-22 MED ORDER — IPRATROPIUM-ALBUTEROL 20-100 MCG/ACT IN AERS
2.0000 | INHALATION_SPRAY | Freq: Three times a day (TID) | RESPIRATORY_TRACT | Status: DC
  Filled 2019-04-22: qty 4

## 2019-04-22 MED ORDER — IPRATROPIUM-ALBUTEROL 20-100 MCG/ACT IN AERS
1.0000 | INHALATION_SPRAY | Freq: Three times a day (TID) | RESPIRATORY_TRACT | Status: DC
  Administered 2019-04-22 – 2019-04-27 (×13): 1 via RESPIRATORY_TRACT

## 2019-04-22 MED ORDER — IPRATROPIUM-ALBUTEROL 20-100 MCG/ACT IN AERS
1.0000 | INHALATION_SPRAY | Freq: Four times a day (QID) | RESPIRATORY_TRACT | Status: DC | PRN
  Administered 2019-04-23 – 2019-05-03 (×2): 1 via RESPIRATORY_TRACT
  Filled 2019-04-22: qty 4

## 2019-04-22 MED ORDER — INSULIN DETEMIR 100 UNIT/ML ~~LOC~~ SOLN
15.0000 [IU] | Freq: Every day | SUBCUTANEOUS | Status: DC
  Administered 2019-04-22 – 2019-05-11 (×20): 15 [IU] via SUBCUTANEOUS
  Filled 2019-04-22 (×21): qty 0.15

## 2019-04-22 MED ORDER — INSULIN ASPART 100 UNIT/ML ~~LOC~~ SOLN
8.0000 [IU] | Freq: Three times a day (TID) | SUBCUTANEOUS | Status: DC
  Administered 2019-04-22 – 2019-05-12 (×53): 8 [IU] via SUBCUTANEOUS

## 2019-04-22 NOTE — Progress Notes (Signed)
Patient ID: Cory Miller, male   DOB: 1941/07/15, 78 y.o.   MRN: [id number] S:No events overnight O:BP (!) 162/68 (BP Location: Left Arm)   Pulse 97   Temp 98.2 F (36.8 C) (Oral)   Resp (!) 26   Ht 6\' 1"  (1.854 m)   Wt 112.4 kg   SpO2 99%   BMI 32.69 kg/m  No intake or output data in the 24 hours ending 04/22/19 1238 Intake/Output: I/O last 3 completed shifts: In: 480 [P.O.:480] Out: 2493 [Other:2493]  Intake/Output this shift:  No intake/output data recorded. Weight change: 0.4 kg Direct physical contact was not performed due to concerns for covid-19 infection.  Recent Labs  Lab 04/16/19 0535 04/17/19 0409 04/18/19 0653 04/19/19 0708 04/20/19 0516 04/21/19 0458 04/22/19 1006  NA 135 138 136 136 136 133* 135  K 3.7 4.3 4.6 4.2 4.1 4.6 4.0  CL 94* 101 98 95* 95* 92* 92*  CO2 26 24 24 25 26 25 25   GLUCOSE 261* 153* 198* 203* 181* 434* 264*  BUN 53* 28* 45* 24* 41* 65* 50*  CREATININE 7.22* 5.42* 8.13* 6.75* 9.01* 11.43* 8.68*  ALBUMIN 2.5* 2.6* 2.4* 2.2* 2.3* 2.2* 2.6*  CALCIUM 7.2* 7.8* 7.5* 7.9* 7.9* 8.1* 8.6*  PHOS 4.2 3.3 4.1 4.1 3.9 4.5 3.8  AST 33 49*  --   --   --   --   --   ALT 20 28  --   --   --   --   --    Liver Function Tests: Recent Labs  Lab 04/16/19 0535 04/17/19 0409  04/20/19 0516 04/21/19 0458 04/22/19 1006  AST 33 49*  --   --   --   --   ALT 20 28  --   --   --   --   ALKPHOS 120 164*  --   --   --   --   BILITOT 0.6 1.0  --   --   --   --   PROT 5.9* 6.5  --   --   --   --   ALBUMIN 2.5* 2.6*   < > 2.3* 2.2* 2.6*   < > = values in this interval not displayed.   No results for input(s): LIPASE, AMYLASE in the last 168 hours. No results for input(s): AMMONIA in the last 168 hours. CBC: Recent Labs  Lab 04/16/19 0535 04/17/19 0409 04/21/19 0809  WBC 3.9* 4.3 7.4  NEUTROABS 2.9 2.8  --   HGB 10.7* 11.3* 10.7*  HCT 33.4* 35.7* 32.5*  MCV 99.7 100.0 98.2  PLT 179 176 325   Cardiac Enzymes: Recent Labs  Lab 04/16/19 0535  04/17/19 0409  CKTOTAL 75 75   CBG: Recent Labs  Lab 04/21/19 0756 04/21/19 1341 04/21/19 1715 04/21/19 2245 04/22/19 0842  GLUCAP 380* 162* 243* 254* 252*    Iron Studies:  Recent Labs    04/22/19 1006  FERRITIN 3,862*   Studies/Results: No results found. [name] atorvastatin  40 mg Oral QHS  . benzonatate  100 mg Oral TID  . carvedilol  6.25 mg Oral BID WC  . Chlorhexidine Gluconate Cloth  6 each Topical Q0600  . cloNIDine  0.1 mg Oral Daily  . clopidogrel  75 mg Oral Daily  . docusate sodium  100 mg Oral BID  . dorzolamide-timolol  1 drop Both Eyes BID  . famotidine  20 mg Oral Daily  . heparin  7,500 Units Subcutaneous Q8H  . insulin aspart  0-15 Units Subcutaneous TID WC  . insulin aspart  8 Units Subcutaneous TID WC  . insulin detemir  15 Units Subcutaneous QHS  . Ipratropium-Albuterol  1 puff Inhalation Q6H  . methylPREDNISolone (SOLU-MEDROL) injection  60 mg Intravenous Daily  . pantoprazole  40 mg Oral BID  . sodium chloride flush  3 mL Intravenous Q12H  . vitamin C  500 mg Oral Daily  . zinc sulfate  220 mg Oral Daily    BMET    Component Value Date/Time   NA 135 04/22/2019 1006   K 4.0 04/22/2019 1006   CL 92 (L) 04/22/2019 1006   CO2 25 04/22/2019 1006   GLUCOSE 264 (H) 04/22/2019 1006   BUN 50 (H) 04/22/2019 1006   CREATININE 8.68 (H) 04/22/2019 1006   CALCIUM 8.6 (L) 04/22/2019 1006   GFRNONAA 5 (L) 04/22/2019 1006   GFRAA 6 (L) 04/22/2019 1006   CBC    Component Value Date/Time   WBC 7.4 04/21/2019 0809   RBC 3.31 (L) 04/21/2019 0809   HGB 10.7 (L) 04/21/2019 0809   HGB 11.9 (L) 04/12/2019 1513   HCT 32.5 (L) 04/21/2019 0809   PLT 325 04/21/2019 0809   MCV 98.2 04/21/2019 0809   MCH 32.3 04/21/2019 0809   MCHC 32.9 04/21/2019 0809   RDW 14.5 04/21/2019 0809   LYMPHSABS 1.0 04/17/2019 0409   MONOABS 0.5 04/17/2019 0409   EOSABS 0.0 04/17/2019 0409   BASOSABS 0.0 04/17/2019 0409     [place] [phone]  MWF,  BFR400 DFR600 AVF 4 hours EDW 116kg (new EDW closer to 112.5kg) 2K/3Ca  Assessment/Plan: 1. covid-19 pneumonia- on 2 L [place].  Repeat swab to see if he is clear/noninfectious  2. ESRD- cont with HD qMWF 3. Anemia of ESRD- cont with ESA 4. SHPTH- stable 5. Second degree heart block- per primary  Donetta Potts, MD [facility] [phone]

## 2019-04-22 NOTE — Progress Notes (Signed)
Pt's son Jazir Newey called for update. This nurse explained that she had just received pt. Son, Ardith Lewman, wanted to know "numbers" stating that provider had called his aunt, but the provider had not given the "numbers". This nurse explained to pt's son, Remo Lipps, that he would have to speak to a provider for lab values. Pt's son wanted to speak to charge nurse. Call forwarded to charge nurse.

## 2019-04-22 NOTE — Progress Notes (Signed)
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 Reports having fevers up to 101 F at home. Patient is not normally on oxygen, but found himself getting easily winded even with minimal exertion. Associated symptoms included sore throat, nasal congestion, diarrhea earlier in the week, and intermittent chest pain. He had continued to go to hemodialysis sessions and had a near complete dialysis session on 5/15. However, was sent to the emergency department for further evaluation due to symptoms. On admission patient was noted to blood pressures up to 191/71, pulse 107, respirations24, O2 saturations 94% on 2 L nasal cannula oxygen. Lab work revealed WBC 5.9, potassium 3.6, CO2 29, BUN 20, creatinine 3.09, and. Lactic acid 1.4. Chest x-ray showed chronic cardiomegaly with basilar congestion without any focal airspace opacity noted. COVID-19 testing was positive. Transfer was requested for need of hemodialysis and COVID-19 testing. Patient was accepted to a progressive bed here at [facility] for need of hemodialysis.  Assessment & Plan: Acute COVID-19 Viral illness Lab Results  Component Value Date   SARSCOV2NAA POSITIVE (A) 04/11/2019   RVP not done No lymphopenia, Low Procalcitonin CXR: hazy bilateral peripheral opacities  Recent Labs    04/20/19 0516 04/21/19 0458 04/22/19 1006  DDIMER 1.48* 1.30* 1.38*  FERRITIN 3,404* 3,581* 3,862*  LDH  --  381*  --   CRP 19.3* 20.2* 9.6*   Tmax:  98.9 Oxygen requirements: 3 LPM, Drops to 70% on 3 L with exertion and needs 5 L.  Antibiotics:none Diuretics: none, on HD  Vitamin C and Zinc: Ordered DVT Prophylaxis: Subcutaneous Heparin  Dose increased due to worsening of inflammatory markers Remdesivir: Contraindicated in ESRD patient Steroids: Starting at 40 mg IV Solu-Medrol dose, continue until 04/27/2019 Actemra: Currently holding, may require if the condition worsens Prone positioning: Patient encouraged to stay in prone position as much as possible.  During this encounter: Patient Isolation: Droplet + Contact HCP PPE: CAPR, gown. gloves Patient PPE: None  Patient is somewhat stable with steroids.  Anticipating continued improvement down the road even though there is some worsening of his hypoxia.  The treatment plan and use of medications and known side effects were discussed with patient/family. It was clearly explained that there is no proven definitive treatment for COVID-19 infection yet. Any medications used here are based on case reports/anecdotal data which are not peer-reviewed and has not been studied using randomized control trials.  Complete risks and long-term side effects are unknown, however in the best clinical judgment they seem to be of some clinical benefit rather than medical risks.  Patient/family agree with the treatment plan and want to receive these treatments as indicated.   ESRD on HD Renal diet and carb modified with fluid restriction HD per nephrology  Possible UTI/asymptomatic bacteriuria UA urine positive for large leukocytes with >50 WBCs. UCx: Negative.  Completed antibiotics.  Second-degree heart block New finding on EKG when compared to previous tracings. Continue to monitor Consult cardiology if needed  Essential hypertension Blood pressure seen to be elevated up to 191/71. Continue Norvasc, Coreg, clonidine, torsemide and hydralazine.  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 Hypoglycemic protocol Continue home  Levemir 10 units at bedtime CBGs with moderate sliding scale insulin  Anemia of chronic disease Hemoglobin appears stable Continue to monitor  Hypokalemia Renal diet and carb modified with fluid restriction HD per nephrology  Deconditioning PT/OT consults recommends SNF.  Indigestion. Continue Pepcid and add Maalox.  DVT prophylaxis: heparin Code Status: FULL had a prolonged goals of care discussion with this patient as well as his son.  Patient currently wants everything to be done. D/w sister on 04/22/2019  Disposition Plan: TBD If gets better can send first COVID repeat testing on Thursday to see if he is negative.   Consultants:   Nephrology  Palliative care    Antimicrobials:  . Rocephin    Subjective: Patient is hard of hearing their communication was made with the use of written notes.   Still not feeling better.  Continues to have shortness of breath and cough.  No nausea no vomiting.  RN reports that the patient gets hypoxic with minimal exertion.  Objective: Vitals:   04/22/19 0526 04/22/19 0844 04/22/19 1600 04/22/19 1653  BP: 140/69 (!) 162/68 (!) 132/56 140/63  Pulse: 90 97 90 89  Resp: 18 (!) 26 (!) 33   Temp: 98.9 F (37.2 C) 98.2 F (36.8 C) 98.9 F (37.2 C)   TempSrc: Oral Oral Oral   SpO2: 96% 99% 91% 93%  Weight: 112.4 kg     Height:       No intake or output data in the 24 hours ending 04/22/19 1901 Filed Weights   04/21/19 0745 04/21/19 1147 04/22/19 0526  Weight: 114.9 kg 112.6 kg 112.4 kg    Examination:  General: 78 y.o. male resting in bed in NAD Cardiovascular: RRR, +S1, S2, no m/g/r, equal pulses throughout, RUE fistula Respiratory: decreased at bases, tachypnic, no accessory muscle use GI: BS+, NDNT, no masses noted, no organomegaly noted MSK: No e/c/c Skin: No rashes, bruises, ulcerations noted Neuro: A&O x 3, no focal deficits, very hard of hearing Psyc: Appropriate interaction and affect, calm/cooperative    Data  Reviewed: I have personally reviewed following labs and imaging studies.  CBC: Recent Labs  Lab 04/16/19 0535 04/17/19 0409 04/21/19 0809  WBC 3.9* 4.3 7.4  NEUTROABS 2.9 2.8  --   HGB 10.7* 11.3* 10.7*  HCT 33.4* 35.7* 32.5*  MCV 99.7 100.0 98.2  PLT 179 176 025   Basic Metabolic Panel: Recent Labs  Lab 04/16/19 0535 04/17/19 0409 04/18/19 0653 04/19/19 0708 04/20/19 0516 04/21/19 0458 04/22/19 1006  NA 135 138 136 136 136 133* 135  K 3.7 4.3 4.6 4.2 4.1 4.6 4.0  CL 94* 101 98 95* 95* 92* 92*  CO2 26 24 24 25 26 25 25   GLUCOSE 261* 153* 198* 203* 181* 434* 264*  BUN 53* 28* 45* 24* 41* 65* 50*  CREATININE 7.22* 5.42* 8.13* 6.75* 9.01* 11.43* 8.68*  CALCIUM 7.2* 7.8* 7.5* 7.9* 7.9* 8.1* 8.6*  MG 1.5* 1.6*  --   --   --   --   --   PHOS 4.2 3.3 4.1 4.1 3.9 4.5 3.8   GFR: Estimated Creatinine Clearance: 9.4 mL/min (A) (by C-G formula based on SCr of 8.68 mg/dL (H)). Liver Function Tests: Recent Labs  Lab 04/16/19 0535 04/17/19 0409 04/18/19 0653 04/19/19 0708 04/20/19 0516 04/21/19 0458 04/22/19 1006  AST 33 49*  --   --   --   --   --   ALT 20 28  --   --   --   --   --  ALKPHOS 120 164*  --   --   --   --   --   BILITOT 0.6 1.0  --   --   --   --   --   PROT 5.9* 6.5  --   --   --   --   --   ALBUMIN 2.5* 2.6* 2.4* 2.2* 2.3* 2.2* 2.6*   No results for input(s): LIPASE, AMYLASE in the last 168 hours. No results for input(s): AMMONIA in the last 168 hours. Coagulation Profile: No results for input(s): INR, PROTIME in the last 168 hours. Cardiac Enzymes: Recent Labs  Lab 04/16/19 0535 04/17/19 0409  CKTOTAL 75 75   BNP (last 3 results) No results for input(s): PROBNP in the last 8760 hours. HbA1C: No results for input(s): HGBA1C in the last 72 hours. CBG: Recent Labs  Lab 04/21/19 1715 04/21/19 2245 04/22/19 0842 04/22/19 1316 04/22/19 1651  GLUCAP 243* 254* 252* 218* 353*   Lipid Profile: No results for input(s): CHOL, HDL, LDLCALC,  TRIG, CHOLHDL, LDLDIRECT in the last 72 hours. Thyroid Function Tests: No results for input(s): TSH, T4TOTAL, FREET4, T3FREE, THYROIDAB in the last 72 hours. Anemia Panel: Recent Labs    04/21/19 0458 04/22/19 1006  FERRITIN 3,581* 3,862*   Sepsis Labs: No results for input(s): PROCALCITON, LATICACIDVEN in the last 168 hours.  Recent Results (from the past 240 hour(s))  Urine Culture     Status: None   Collection Time: 04/15/19  1:47 AM  Result Value Ref Range Status   Specimen Description URINE, CLEAN CATCH  Final   Special Requests NONE  Final   Culture   Final    NO GROWTH Performed at [facility], [address] [place], [place] [address]    Report Status 04/16/2019 FINAL  Final  Culture, blood (routine x 2)     Status: None   Collection Time: 04/17/19  8:50 AM  Result Value Ref Range Status   Specimen Description BLOOD LEFT HAND  Final   Special Requests   Final    BOTTLES DRAWN AEROBIC ONLY Blood Culture adequate volume   Culture   Final    NO GROWTH 5 DAYS Performed at [facility], [place] [address] [place], [place] [address]    Report Status 04/22/2019 FINAL  Final  Culture, blood (routine x 2)     Status: None   Collection Time: 04/17/19  8:55 AM  Result Value Ref Range Status   Specimen Description BLOOD LEFT HAND  Final   Special Requests   Final    BOTTLES DRAWN AEROBIC ONLY Blood Culture adequate volume   Culture   Final    NO GROWTH 5 DAYS Performed at [facility], [place] [address] [place], [place] [address]    Report Status 04/22/2019 FINAL  Final         Radiology Studies: No results found.      Scheduled Meds: . atorvastatin  40 mg Oral QHS  . benzonatate  100 mg Oral TID  . carvedilol  6.25 mg Oral BID WC  . Chlorhexidine Gluconate Cloth  6 each Topical Q0600  . cloNIDine  0.1 mg Oral Daily  . clopidogrel  75 mg Oral Daily  . docusate sodium  100 mg Oral BID  . dorzolamide-timolol  1 drop Both Eyes BID  .  famotidine  20 mg Oral Daily  . heparin  7,500 Units Subcutaneous Q8H  . insulin aspart  0-15 Units Subcutaneous  TID WC  . insulin aspart  8 Units Subcutaneous TID WC  . insulin detemir  15 Units Subcutaneous QHS  . Ipratropium-Albuterol  1 puff Inhalation TID  . methylPREDNISolone (SOLU-MEDROL) injection  60 mg Intravenous Daily  . pantoprazole  40 mg Oral BID  . sodium chloride flush  3 mL Intravenous Q12H  . vitamin C  500 mg Oral Daily  . zinc sulfate  220 mg Oral Daily   Continuous Infusions: . sodium chloride    . sodium chloride       LOS: 10 days    Time spent: 35 minutes spent in the coordination of care today.   Berle Mull   If 7PM-7AM, please contact night-coverage [email or web address] Password [email or web address] 04/22/2019, 7:01 PM

## 2019-04-22 NOTE — Progress Notes (Signed)
PT Cancellation Note  Patient Details Name: Cory Miller MRN: [id number] DOB: 1940/12/01   Cancelled Treatment:    Reason Eval/Treat Not Completed: Patient not medically ready. Per RN, pt medically declining. Yesterday pt was able to ambulate to the bathroom, pt now with difficulty transferring to [facility]. Pt asking MD "Am I going to make it?" Pt's SpO2 declining drastically with any movement on 5lO2 via [place]. PT to return as able if/when appropriate.  Kittie Plater, PT, DPT Acute Rehabilitation Services Pager #: [phone] Office #: [phone]    Berline Lopes 04/22/2019, 1:25 PM

## 2019-04-23 DIAGNOSIS — E1122 Type 2 diabetes mellitus with diabetic chronic kidney disease: Secondary | ICD-10-CM

## 2019-04-23 LAB — RENAL FUNCTION PANEL
Albumin: 2.4 g/dL — ABNORMAL LOW (ref 3.5–5.0)
Anion gap: 19 — ABNORMAL HIGH (ref 5–15)
BUN: 77 mg/dL — ABNORMAL HIGH (ref 8–23)
CO2: 24 mmol/L (ref 22–32)
Calcium: 8.4 mg/dL — ABNORMAL LOW (ref 8.9–10.3)
Chloride: 91 mmol/L — ABNORMAL LOW (ref 98–111)
Creatinine, Ser: 10.28 mg/dL — ABNORMAL HIGH (ref 0.61–1.24)
GFR calc Af Amer: 5 mL/min — ABNORMAL LOW (ref >60)
GFR calc non Af Amer: 4 mL/min — ABNORMAL LOW (ref >60)
Glucose, Bld: 347 mg/dL — ABNORMAL HIGH (ref 70–99)
Phosphorus: 4.8 mg/dL — ABNORMAL HIGH (ref 2.5–4.6)
Potassium: 4.7 mmol/L (ref 3.5–5.1)
Sodium: 134 mmol/L — ABNORMAL LOW (ref 135–145)

## 2019-04-23 LAB — CBC
HCT: 32.4 % — ABNORMAL LOW (ref 39.0–52.0)
Hemoglobin: 10.7 g/dL — ABNORMAL LOW (ref 13.0–17.0)
MCH: 31.7 pg (ref 26.0–34.0)
MCHC: 33 g/dL (ref 30.0–36.0)
MCV: 95.9 fL (ref 80.0–100.0)
Platelets: 417 10*3/uL — ABNORMAL HIGH (ref 150–400)
RBC: 3.38 MIL/uL — ABNORMAL LOW (ref 4.22–5.81)
RDW: 14.5 % (ref 11.5–15.5)
WBC: 14.1 10*3/uL — ABNORMAL HIGH (ref 4.0–10.5)
nRBC: 0 % (ref 0.0–0.2)

## 2019-04-23 LAB — GLUCOSE, CAPILLARY
Glucose-Capillary: 206 mg/dL — ABNORMAL HIGH (ref 70–99)
Glucose-Capillary: 351 mg/dL — ABNORMAL HIGH (ref 70–99)
Glucose-Capillary: 401 mg/dL — ABNORMAL HIGH (ref 70–99)

## 2019-04-23 LAB — D-DIMER, QUANTITATIVE: D-Dimer, Quant: 0.9 ug/mL-FEU — ABNORMAL HIGH (ref 0.00–0.50)

## 2019-04-23 LAB — FERRITIN: Ferritin: 3241 ng/mL — ABNORMAL HIGH (ref 24–336)

## 2019-04-23 LAB — C-REACTIVE PROTEIN: CRP: 8.7 mg/dL — ABNORMAL HIGH (ref <1.0)

## 2019-04-23 NOTE — Progress Notes (Signed)
Physical Therapy Treatment Patient Details Name: Cory Miller MRN: [id number] DOB: 08-Jan-1941 Today's Date: 04/23/2019    History of Present Illness Cory Miller is a 78 y.o. 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  Reports having fevers up to 101 F at home. found to be Covid 19 +    PT Comments    Patient seen after several missed visits. Now on 4L. Ambulating laps in room amounting to ~60' before he asks to rest. SpO2 in 90s majority of session, dipped to 85% towards end, returns quickly to 98%. With more progress pt may be able to transition to HHPT, cont to rec SNF at this time.      Follow Up Recommendations  SNF;Other (comment)(Will monitor for progress)     Equipment Recommendations  Other (comment)(consider rollator)    Recommendations for Other Services OT consult(as ordered)     Precautions / Restrictions Precautions Precautions: Fall Precaution Comments: Fall risk low (especially with RW), but present Restrictions Weight Bearing Restrictions: No    Mobility  Bed Mobility Overal bed mobility: Needs Assistance Bed Mobility: Supine to Sit     Supine to sit: Min guard     General bed mobility comments: Minguard assist for safety and lines  Transfers Overall transfer level: Needs assistance Equipment used: Straight cane;Rolling walker (2 wheeled) Transfers: Sit to/from Stand Sit to Stand: Min assist         General transfer comment: Min assist for safety and steadiness; a few tries before successfully standing from bed and recliner; dependent on UE push/support from assistive device  Ambulation/Gait Ambulation/Gait assistance: Min guard(with and without physical contact) Gait Distance (Feet): 50 Feet Assistive device: Straight  cane;Rolling walker (2 wheeled) Gait Pattern/deviations: Step-through pattern Gait velocity: decreased   General Gait Details: pt with mulitple laps in room to door and back on 4L, desat to 85% towards end of visit, reutrns to 96% quickly.    Stairs             Wheelchair Mobility    Modified Rankin (Stroke Patients Only)       Balance Overall balance assessment: Needs assistance   Sitting balance-Leahy Scale: Good       Standing balance-Leahy Scale: Fair                              Cognition Arousal/Alertness: Awake/alert Behavior During Therapy: WFL for tasks assessed/performed Overall Cognitive Status: Difficult to assess                                        Exercises      General Comments        Pertinent Vitals/Pain      Home Living                      Prior Function            PT Goals (current goals can now be found in the care plan section) Acute Rehab PT Goals Patient Stated Goal: did not specifically state PT Goal Formulation: With patient Time For Goal Achievement: 04/30/19 Potential to Achieve Goals: Good Progress towards  PT goals: Progressing toward goals    Frequency    Min 3X/week      PT Plan Current plan remains appropriate    Co-evaluation PT/OT/SLP Co-Evaluation/Treatment: Yes            AM-PAC PT "6 Clicks" Mobility   Outcome Measure  Help needed turning from your back to your side while in a flat bed without using bedrails?: None Help needed moving from lying on your back to sitting on the side of a flat bed without using bedrails?: A Little Help needed moving to and from a bed to a chair (including a wheelchair)?: A Little Help needed standing up from a chair using your arms (e.g., wheelchair or bedside chair)?: A Little Help needed to walk in hospital room?: A Little Help needed climbing 3-5 steps with a railing? : A Lot 6 Click Score: 18    End of Session  Equipment Utilized During Treatment: Gait belt;Oxygen Activity Tolerance: Patient tolerated treatment well Patient left: in chair;with bed alarm set;with chair alarm set Nurse Communication: Mobility status;Other (comment)(Need for supplemental O2 with activity) PT Visit Diagnosis: Difficulty in walking, not elsewhere classified (R26.2);Other (comment)(decr functional capacity)     Time: 1400-1430 PT Time Calculation (min) (ACUTE ONLY): 30 min  Charges:  $Gait Training: 8-22 mins                    Reinaldo Berber, PT, DPT Acute Rehabilitation Services Pager: [phone] Office: [phone]     Reinaldo Berber 04/23/2019, 2:27 PM

## 2019-04-23 NOTE — Progress Notes (Signed)
OT Cancellation Note  Patient Details Name: Cory Miller MRN: [id number] DOB: 1941/05/05   Cancelled Treatment:    Reason Eval/Treat Not Completed: Patient at procedure or test/ unavailable(HD. Will return as schedule allows. Thank you)  [place], OTR/L Acute Rehab Pager: [phone] Office: [phone] 04/23/2019, 8:58 AM

## 2019-04-23 NOTE — Progress Notes (Signed)
Occupational Therapy Treatment Patient Details Name: Cory Miller MRN: [id number] DOB: 08-11-1941 Today's Date: 04/23/2019    History of present illness Cory Miller is a 78 y.o. 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  Reports having fevers up to 101 F at home. found to be Covid 19 +   OT comments  Pt increasing independence with ADL and requires motivation and increased time as pt fatigues easily. Pt performing LB dressing EOB with supervisionA and figure 4 technique. Pt ambulating in room with SPC and minguardA for stability; transferring to recliner with minA to avoid plopping. Pt continues to require OT skilled services for ADL and mobility in SNF setting. Pt could progress to [facility] depending on assist at home. OT to follow acutely.    Follow Up Recommendations  SNF;Supervision/Assistance - 24 hour(may progress to [facility] )    Equipment Recommendations  None recommended by OT    Recommendations for Other Services      Precautions / Restrictions Precautions Precautions: Fall Precaution Comments: Fall risk low (especially with RW), but present Restrictions Weight Bearing Restrictions: No       Mobility Bed Mobility Overal bed mobility: Needs Assistance Bed Mobility: Supine to Sit     Supine to sit: Min guard     General bed mobility comments: Minguard assist for safety and lines  Transfers Overall transfer level: Needs assistance Equipment used: Straight cane Transfers: Sit to/from Stand Sit to Stand: Min assist         General transfer comment: minA for power up and management of lines    Balance Overall balance assessment: Needs assistance   Sitting balance-Leahy Scale: Good     Standing balance support: Bilateral upper extremity  supported;During functional activity Standing balance-Leahy Scale: Fair Standing balance comment: Relies on UE support on [facility]                           ADL either performed or assessed with clinical judgement   ADL Overall ADL's : Needs assistance/impaired     Grooming: Wash/dry hands;Wash/dry face;Standing               Lower Body Dressing: Min guard;Sitting/lateral leans;Sit to/from stand Lower Body Dressing Details (indicate cue type and reason): sat EOB for donning and doffing of socks with increased effort and time                     Vision       Perception     Praxis      Cognition Arousal/Alertness: Awake/alert Behavior During Therapy: WFL for tasks assessed/performed Overall Cognitive Status: Difficult to assess                                          Exercises     Shoulder Instructions       General Comments >88% on 4L O2    Pertinent Vitals/ Pain          Home Living  Prior Functioning/Environment              Frequency  Min 2X/week        Progress Toward Goals  OT Goals(current goals can now be found in the care plan section)  Progress towards OT goals: Progressing toward goals  Acute Rehab OT Goals Patient Stated Goal: I want to go home OT Goal Formulation: With patient Time For Goal Achievement: 04/30/19 Potential to Achieve Goals: Good ADL Goals Pt Will Perform Grooming: with modified independence;standing Pt Will Perform Lower Body Dressing: with modified independence;sit to/from stand;sitting/lateral leans Pt Will Transfer to Toilet: with modified independence;ambulating;regular height toilet Pt Will Perform Toileting - Clothing Manipulation and hygiene: with modified independence;sit to/from stand Additional ADL Goal #1: Pt will demonstrate increased activity tolerance to perform four ADL tasks in standing with supervision   Plan Discharge plan remains appropriate    Co-evaluation    PT/OT/SLP Co-Evaluation/Treatment: Yes Reason for Co-Treatment: Complexity of the patient's impairments (multi-system involvement);To address functional/ADL transfers;Other (comment)(pt was thought to have had a significant decline)   OT goals addressed during session: ADL's and self-care      AM-PAC OT "6 Clicks" Daily Activity     Outcome Measure   Help from another person eating meals?: None Help from another person taking care of personal grooming?: None Help from another person toileting, which includes using toliet, bedpan, or urinal?: A Little Help from another person bathing (including washing, rinsing, drying)?: A Little Help from another person to put on and taking off regular upper body clothing?: None Help from another person to put on and taking off regular lower body clothing?: A Little 6 Click Score: 21    End of Session Equipment Utilized During Treatment: Gait belt;Rolling walker;Oxygen  OT Visit Diagnosis: Unsteadiness on feet (R26.81);Other abnormalities of gait and mobility (R26.89);Muscle weakness (generalized) (M62.81);Other symptoms and signs involving cognitive function   Activity Tolerance Patient tolerated treatment well   Patient Left in chair;with call bell/phone within reach;with chair alarm set   Nurse Communication Mobility status        Time: 1400-1435 OT Time Calculation (min): 35 min  Charges: OT General Charges $OT Visit: 1 Visit OT Treatments $Self Care/Home Management : 8-22 mins  Darryl Nestle) Marsa Aris OTR/L Acute Rehabilitation Services Pager: [phone] Office: [place] 04/23/2019, 4:30 PM

## 2019-04-23 NOTE — Progress Notes (Signed)
Inpatient Diabetes Program Recommendations  AACE/ADA: New Consensus Statement on Inpatient Glycemic Control (2015)  Target Ranges:  Prepandial:   less than 140 mg/dL      Peak postprandial:   less than 180 mg/dL (1-2 hours)      Critically ill patients:  140 - 180 mg/dL    Results for TORIANO, AIKEY (MRN [id number]) as of 04/23/2019 11:01  Ref. Range 04/22/2019 08:42 04/22/2019 13:16 04/22/2019 16:51 04/22/2019 20:55  Glucose-Capillary Latest Ref Range: 70 - 99 mg/dL 252 (H) 218 (H) 353 (H) 174 (H)  Results for TED, GOODNER (MRN [id number]) as of 04/23/2019 11:01  Ref. Range 04/23/2019 04:26  Glucose Latest Ref Range: 70 - 99 mg/dL 347 (H)   Review of Glycemic Control  Diabetes history: DM 2 Outpatient Diabetes medications: Levemir 10 units qhs, Novolog 10 units tid with meals  Current orders for Inpatient glycemic control: Levemir 15 units qhs, Novolog 0-15 units tid, Novolog 8 units tid meal coverage  Inpatient Diabetes Program Recommendations:    Glucose 300's this am. Patient receiving IV Solumedrol 60 mg Daily. If steroid dose remains the same consider:   -  increasing Levemir to 12 units bid.    Thanks,  Tama Headings RN, MSN, BC-ADM Inpatient Diabetes Coordinator Team Pager [phone] (8a-5p)

## 2019-04-23 NOTE — Progress Notes (Signed)
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 Reports having fevers up to 101 F at home. Patient is not normally on oxygen, but found himself getting easily winded even with minimal exertion. Associated symptoms included sore throat, nasal congestion, diarrhea earlier in the week, and intermittent chest pain. He had continued to go to hemodialysis sessions and had a near complete dialysis session on 5/15. However, was sent to the emergency department for further evaluation due to symptoms. On admission patient was noted to blood pressures up to 191/71, pulse 107, respirations24, O2 saturations 94% on 2 L nasal cannula oxygen. Lab work revealed WBC 5.9, potassium 3.6, CO2 29, BUN 20, creatinine 3.09, and. Lactic acid 1.4. Chest x-ray showed chronic cardiomegaly with basilar congestion without any focal airspace opacity noted. COVID-19 testing was positive. Transfer was requested for need of hemodialysis and COVID-19 testing. Patient was accepted to a progressive bed here at [facility] for need of hemodialysis.  Assessment & Plan: Acute COVID-19 Viral illness Lab Results  Component Value Date   SARSCOV2NAA POSITIVE (A) 04/11/2019   RVP not done No lymphopenia, Low Procalcitonin CXR: hazy bilateral peripheral opacities  Recent Labs    04/21/19 0458 04/22/19 1006 04/23/19 0426  DDIMER 1.30* 1.38* 0.90*  FERRITIN 3,581* 3,862* 3,241*  LDH 381*  --   --   CRP 20.2* 9.6* 8.7*   Tmax:  98.9 Oxygen requirements: 3 LPM, Drops to 70% on 3 L with exertion and needs 5 L.  Antibiotics:none Diuretics: none, on HD  Vitamin C and Zinc: Ordered DVT Prophylaxis: Subcutaneous Heparin  Dose increased due to worsening of inflammatory markers Remdesivir: Contraindicated in ESRD patient Steroids: Starting at 40 mg IV Solu-Medrol dose, continue until 04/27/2019 Actemra: Currently holding, may require if the condition worsens Prone positioning: Patient encouraged to stay in prone position as much as possible.  During this encounter: Patient Isolation: Droplet + Contact HCP PPE: CAPR, gown. gloves Patient PPE: None  Patient is somewhat stable with steroids.  Anticipating continued improvement down the road even though there is some worsening of his hypoxia.  The treatment plan and use of medications and known side effects were discussed with patient/family. It was clearly explained that there is no proven definitive treatment for COVID-19 infection yet. Any medications used here are based on case reports/anecdotal data which are not peer-reviewed and has not been studied using randomized control trials.  Complete risks and long-term side effects are unknown, however in the best clinical judgment they seem to be of some clinical benefit rather than medical risks.  Patient/family agree with the treatment plan and want to receive these treatments as indicated.   ESRD on HD Renal diet and carb modified with fluid restriction HD per nephrology  Possible UTI/asymptomatic bacteriuria UA urine positive for large leukocytes with >50 WBCs. UCx: Negative.  Completed antibiotics.  Second-degree heart block New finding on EKG when compared to previous tracings. Continue to monitor Consult cardiology if needed  Essential hypertension Blood pressure seen to be elevated up to 191/71. Continue Norvasc, Coreg, clonidine, torsemide and hydralazine.  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 Hypoglycemic protocol Continue home  Levemir 10 units at bedtime CBGs with moderate sliding scale insulin  Anemia of chronic disease Hemoglobin appears stable Continue to monitor  Hypokalemia Renal diet and carb modified with fluid restriction HD per nephrology  Deconditioning PT/OT consults recommends SNF.  Indigestion. Continue Pepcid and add Maalox.  DVT prophylaxis: heparin Code Status: FULL had a prolonged goals of care discussion with this patient as well as his son.  Patient currently wants everything to be done. D/w sister on 04/23/2019  Disposition Plan: needs snf placement If gets better can send first COVID repeat testing on Thursday to see if he is negative.    Consultants:   Nephrology  Palliative care    Antimicrobials:  . Rocephin    Subjective: Patient is hard of hearing their communication was made with the use of written notes.   Still not feeling better.  Continues to have shortness of breath and cough.  No nausea no vomiting.  RN reports that the patient gets hypoxic with minimal exertion.  He is on 3liter o2, rr range from 20-30 He is smiling when I exam him , he reports feeling better  Objective: Vitals:   04/23/19 1100 04/23/19 1115 04/23/19 1120 04/23/19 1621  BP: (!) 154/66 135/72 136/71 (!) 151/65  Pulse: 87 91 92 99  Resp: (!) 25 (!) 30 (!) 30 (!) 31  Temp:      TempSrc:      SpO2: 94% 95% 95% 99%  Weight:   109.1 kg   Height:        Intake/Output Summary (Last 24 hours) at 04/23/2019 1929 Last data filed at 04/23/2019 1120 Gross per 24 hour  Intake -  Output 2000 ml  Net -2000 ml   Filed Weights   04/23/19 0539 04/23/19 0710 04/23/19 1120  Weight: 112.6 kg 111.7 kg 109.1 kg    Examination:  General: 78 y.o. male resting in bed in NAD Cardiovascular: RRR, +S1, S2, no m/g/r, equal pulses throughout, RUE fistula Respiratory: decreased at bases, tachypnic, no accessory muscle use GI: BS+, NDNT, no masses noted, no organomegaly noted MSK: No e/c/c Skin: No  rashes, bruises, ulcerations noted Neuro: A&O x 3, no focal deficits, very hard of hearing Psyc: Appropriate interaction and affect, calm/cooperative    Data Reviewed: I have personally reviewed following labs and imaging studies.  CBC: Recent Labs  Lab 04/17/19 0409 04/21/19 0809 04/23/19 0719  WBC 4.3 7.4 14.1*  NEUTROABS 2.8  --   --   HGB 11.3* 10.7* 10.7*  HCT 35.7* 32.5* 32.4*  MCV 100.0 98.2 95.9  PLT 176 325 810*   Basic Metabolic Panel: Recent Labs  Lab 04/17/19 0409  04/19/19 0708 04/20/19 0516 04/21/19 0458 04/22/19 1006 04/23/19 0426  NA 138   < > 136 136 133* 135 134*  K 4.3   < > 4.2 4.1 4.6 4.0 4.7  CL 101   < > 95* 95* 92* 92* 91*  CO2 24   < > 25 26 25 25 24   GLUCOSE 153*   < > 203* 181* 434* 264* 347*  BUN 28*   < > 24* 41* 65* 50* 77*  CREATININE 5.42*   < > 6.75* 9.01* 11.43* 8.68* 10.28*  CALCIUM 7.8*   < > 7.9* 7.9* 8.1* 8.6* 8.4*  MG 1.6*  --   --   --   --   --   --   PHOS 3.3   < > 4.1 3.9  4.5 3.8 4.8*   < > = values in this interval not displayed.   GFR: Estimated Creatinine Clearance: 7.8 mL/min (A) (by C-G formula based on SCr of 10.28 mg/dL (H)). Liver Function Tests: Recent Labs  Lab 04/17/19 0409  04/19/19 0708 04/20/19 0516 04/21/19 0458 04/22/19 1006 04/23/19 0426  AST 49*  --   --   --   --   --   --   ALT 28  --   --   --   --   --   --   ALKPHOS 164*  --   --   --   --   --   --   BILITOT 1.0  --   --   --   --   --   --   PROT 6.5  --   --   --   --   --   --   ALBUMIN 2.6*   < > 2.2* 2.3* 2.2* 2.6* 2.4*   < > = values in this interval not displayed.   No results for input(s): LIPASE, AMYLASE in the last 168 hours. No results for input(s): AMMONIA in the last 168 hours. Coagulation Profile: No results for input(s): INR, PROTIME in the last 168 hours. Cardiac Enzymes: Recent Labs  Lab 04/17/19 0409  CKTOTAL 75   BNP (last 3 results) No results for input(s): PROBNP in the last 8760 hours. HbA1C: No results  for input(s): HGBA1C in the last 72 hours. CBG: Recent Labs  Lab 04/22/19 1316 04/22/19 1651 04/22/19 2055 04/23/19 1213 04/23/19 1624  GLUCAP 218* 353* 174* 206* 351*   Lipid Profile: No results for input(s): CHOL, HDL, LDLCALC, TRIG, CHOLHDL, LDLDIRECT in the last 72 hours. Thyroid Function Tests: No results for input(s): TSH, T4TOTAL, FREET4, T3FREE, THYROIDAB in the last 72 hours. Anemia Panel: Recent Labs    04/22/19 1006 04/23/19 0426  FERRITIN 3,862* 3,241*   Sepsis Labs: No results for input(s): PROCALCITON, LATICACIDVEN in the last 168 hours.  Recent Results (from the past 240 hour(s))  Urine Culture     Status: None   Collection Time: 04/15/19  1:47 AM  Result Value Ref Range Status   Specimen Description URINE, CLEAN CATCH  Final   Special Requests NONE  Final   Culture   Final    NO GROWTH Performed at [facility], [address] [place], [place] [address]    Report Status 04/16/2019 FINAL  Final  Culture, blood (routine x 2)     Status: None   Collection Time: 04/17/19  8:50 AM  Result Value Ref Range Status   Specimen Description BLOOD LEFT HAND  Final   Special Requests   Final    BOTTLES DRAWN AEROBIC ONLY Blood Culture adequate volume   Culture   Final    NO GROWTH 5 DAYS Performed at [facility], [place] [address], [place], [place] [address]    Report Status 04/22/2019 FINAL  Final  Culture, blood (routine x 2)     Status: None   Collection Time: 04/17/19  8:55 AM  Result Value Ref Range Status   Specimen Description BLOOD LEFT HAND  Final   Special Requests   Final    BOTTLES DRAWN AEROBIC ONLY Blood Culture adequate volume   Culture   Final    NO GROWTH 5 DAYS Performed at [facility], [place] [address] [place], [place] [address]    Report Status 04/22/2019 FINAL  Final  Radiology Studies: No results found.      Scheduled Meds: . atorvastatin  40 mg Oral QHS  . benzonatate  100 mg Oral TID  .  carvedilol  6.25 mg Oral BID WC  . Chlorhexidine Gluconate Cloth  6 each Topical Q0600  . cloNIDine  0.1 mg Oral Daily  . clopidogrel  75 mg Oral Daily  . docusate sodium  100 mg Oral BID  . dorzolamide-timolol  1 drop Both Eyes BID  . famotidine  20 mg Oral Daily  . heparin  7,500 Units Subcutaneous Q8H  . insulin aspart  0-15 Units Subcutaneous TID WC  . insulin aspart  8 Units Subcutaneous TID WC  . insulin detemir  15 Units Subcutaneous QHS  . Ipratropium-Albuterol  1 puff Inhalation TID  . methylPREDNISolone (SOLU-MEDROL) injection  60 mg Intravenous Daily  . pantoprazole  40 mg Oral BID  . sodium chloride flush  3 mL Intravenous Q12H  . vitamin C  500 mg Oral Daily  . zinc sulfate  220 mg Oral Daily   Continuous Infusions: . sodium chloride    . sodium chloride       LOS: 11 days    Time spent: 35 minutes spent in the coordination of care today.   Florencia Reasons  MD PhD  If 7PM-7AM, please contact night-coverage [email or web address] Password [facility] 04/23/2019, 7:29 PM

## 2019-04-23 NOTE — Procedures (Signed)
I was present at this dialysis session. I have reviewed the session itself and made appropriate changes.   Vital signs in last 24 hours:  Temp:  [97.4 F (36.3 C)-98.9 F (37.2 C)] 97.4 F (36.3 C) (05/27 0710) Pulse Rate:  [76-97] 88 (05/27 0830) Resp:  [16-33] 27 (05/27 0830) BP: (121-162)/(43-75) 153/64 (05/27 0830) SpO2:  [91 %-99 %] 93 % (05/27 0830) Weight:  [111.7 kg-112.6 kg] 111.7 kg (05/27 0710) Weight change: -2.3 kg Filed Weights   04/22/19 0526 04/23/19 0539 04/23/19 0710  Weight: 112.4 kg 112.6 kg 111.7 kg    Recent Labs  Lab 04/23/19 0426  NA 134*  K 4.7  CL 91*  CO2 24  GLUCOSE 347*  BUN 77*  CREATININE 10.28*  CALCIUM 8.4*  PHOS 4.8*    Recent Labs  Lab 04/17/19 0409 04/21/19 0809 04/23/19 0719  WBC 4.3 7.4 14.1*  NEUTROABS 2.8  --   --   HGB 11.3* 10.7* 10.7*  HCT 35.7* 32.5* 32.4*  MCV 100.0 98.2 95.9  PLT 176 325 417*    Scheduled Meds: . atorvastatin  40 mg Oral QHS  . benzonatate  100 mg Oral TID  . carvedilol  6.25 mg Oral BID WC  . Chlorhexidine Gluconate Cloth  6 each Topical Q0600  . cloNIDine  0.1 mg Oral Daily  . clopidogrel  75 mg Oral Daily  . docusate sodium  100 mg Oral BID  . dorzolamide-timolol  1 drop Both Eyes BID  . famotidine  20 mg Oral Daily  . heparin  7,500 Units Subcutaneous Q8H  . insulin aspart  0-15 Units Subcutaneous TID WC  . insulin aspart  8 Units Subcutaneous TID WC  . insulin detemir  15 Units Subcutaneous QHS  . Ipratropium-Albuterol  1 puff Inhalation TID  . methylPREDNISolone (SOLU-MEDROL) injection  60 mg Intravenous Daily  . pantoprazole  40 mg Oral BID  . sodium chloride flush  3 mL Intravenous Q12H  . vitamin C  500 mg Oral Daily  . zinc sulfate  220 mg Oral Daily   Continuous Infusions: . sodium chloride    . sodium chloride     PRN Meds:.sodium chloride, sodium chloride, acetaminophen, alteplase, alum & mag hydroxide-simeth, butalbital-acetaminophen-caffeine,  chlorpheniramine-HYDROcodone, guaiFENesin-dextromethorphan, heparin, heparin, hydrOXYzine, Ipratropium-Albuterol, lidocaine (PF), lidocaine-prilocaine, ondansetron **OR** ondansetron (ZOFRAN) IV, oxyCODONE-acetaminophen, pentafluoroprop-tetrafluoroeth, simethicone, zolpidem   Donetta Potts,  MD 04/23/2019, 8:37 AM

## 2019-04-23 NOTE — Care Management Important Message (Signed)
Important Message  Patient Details  Name: Cory Miller MRN: [id number] Date of Birth: [birth year]   Medicare Important Message Given:  Yes    Gerrianne Scale Corney Knighton, LCSW 04/23/2019, 4:21 PM

## 2019-04-23 NOTE — Progress Notes (Signed)
PT Cancellation Note  Patient Details Name: Cory Miller MRN: [id number] DOB: 07-07-1941   Cancelled Treatment:    Reason Eval/Treat Not Completed: Patient at procedure or test/unavailable.  HD is still running in his room.  PT will see if a colleague can check back at a later time.   Thanks,  Barbarann Ehlers. Johanna Stafford, PT, DPT  Acute Rehabilitation [phone] pager [phone]) [phone] office     [name] 04/23/2019, 11:36 AM

## 2019-04-24 LAB — RENAL FUNCTION PANEL
Albumin: 2.5 g/dL — ABNORMAL LOW (ref 3.5–5.0)
Anion gap: 15 (ref 5–15)
BUN: 57 mg/dL — ABNORMAL HIGH (ref 8–23)
CO2: 25 mmol/L (ref 22–32)
Calcium: 8.4 mg/dL — ABNORMAL LOW (ref 8.9–10.3)
Chloride: 95 mmol/L — ABNORMAL LOW (ref 98–111)
Creatinine, Ser: 7.56 mg/dL — ABNORMAL HIGH (ref 0.61–1.24)
GFR calc Af Amer: 7 mL/min — ABNORMAL LOW (ref >60)
GFR calc non Af Amer: 6 mL/min — ABNORMAL LOW (ref >60)
Glucose, Bld: 254 mg/dL — ABNORMAL HIGH (ref 70–99)
Phosphorus: 5.5 mg/dL — ABNORMAL HIGH (ref 2.5–4.6)
Potassium: 3.9 mmol/L (ref 3.5–5.1)
Sodium: 135 mmol/L (ref 135–145)

## 2019-04-24 LAB — SARS CORONAVIRUS 2 BY RT PCR (HOSPITAL ORDER, PERFORMED IN ~~LOC~~ HOSPITAL LAB): SARS Coronavirus 2: POSITIVE — AB

## 2019-04-24 LAB — GLUCOSE, CAPILLARY
Glucose-Capillary: 238 mg/dL — ABNORMAL HIGH (ref 70–99)
Glucose-Capillary: 127 mg/dL — ABNORMAL HIGH (ref 70–99)
Glucose-Capillary: 173 mg/dL — ABNORMAL HIGH (ref 70–99)
Glucose-Capillary: 163 mg/dL — ABNORMAL HIGH (ref 70–99)

## 2019-04-24 LAB — FERRITIN: Ferritin: 2670 ng/mL — ABNORMAL HIGH (ref 24–336)

## 2019-04-24 LAB — C-REACTIVE PROTEIN: CRP: 4.4 mg/dL — ABNORMAL HIGH (ref <1.0)

## 2019-04-24 LAB — D-DIMER, QUANTITATIVE: D-Dimer, Quant: 0.97 ug/mL-FEU — ABNORMAL HIGH (ref 0.00–0.50)

## 2019-04-24 NOTE — Progress Notes (Addendum)
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 Reports having fevers up to 101 F at home. Patient is not normally on oxygen, but found himself getting easily winded even with minimal exertion. Associated symptoms included sore throat, nasal congestion, diarrhea earlier in the week, and intermittent chest pain. He had continued to go to hemodialysis sessions and had a near complete dialysis session on 5/15. However, was sent to the emergency department for further evaluation due to symptoms. On admission  (To [facility] )patient was noted to blood pressures up to 191/71, pulse 107, respirations24, O2 saturations 94% on 2 L nasal cannula oxygen. Lab work revealed WBC 5.9, potassium 3.6, CO2 29, BUN 20, creatinine 3.09, and. Lactic acid 1.4. Chest x-ray showed chronic cardiomegaly with basilar congestion without any focal airspace opacity noted. COVID-19 testing was positive. Transfer was requested for need of hemodialysis and COVID-19 testing was positive on 5/13 at [facility] . Patient was accepted to a progressive bed here at [facility] for need of hemodialysis.  Assessment & Plan: Acute COVID-19 Viral illness,  Lab Results  Component Value Date   SARSCOV2NAA POSITIVE (A) 04/11/2019   RVP not done No lymphopenia, Low Procalcitonin CXR: hazy bilateral peripheral opacities  Recent Labs    04/22/19 1006 04/23/19 0426 04/24/19 0502  DDIMER 1.38* 0.90* 0.97*  FERRITIN 3,862* 3,241* 2,670*  CRP 9.6* 8.7* 4.4*   Tmax:  98.9 Oxygen requirements: 3 LPM, Drops to 70% on 3 L with exertion and needs 5  L.  Antibiotics:none Diuretics: none, on HD Vitamin C and Zinc: Ordered DVT Prophylaxis: Subcutaneous Heparin  Dose increased due to worsening of inflammatory markers Remdesivir: Contraindicated in ESRD patient Steroids: Starting at 40 mg IV Solu-Medrol dose, finished total of 5 days treatment, patient appear improving, now blood glucose elevated, will d/c steroids Actemra: Currently holding, may require if the condition worsens Prone positioning: Patient encouraged to stay in prone position as much as possible.  During this encounter: Patient Isolation: Droplet + Contact HCP PPE: CAPR, gown. gloves Patient PPE: None  Appear slightly improved today, will repeat covid test today, will need to be tested negative for discharge due to the need of outpatient dialysis Anticipating improvement  The treatment plan and use of medications and known side effects were discussed with patient/family. It was clearly explained that there is no proven definitive treatment for COVID-19 infection yet. Any medications used here are based on case reports/anecdotal data which are not peer-reviewed and has not been studied using randomized control trials.  Complete risks and long-term side effects are unknown, however in the best clinical judgment they seem to be of some clinical benefit rather than medical risks.  Patient/family agree with the treatment plan and want to receive these treatments as indicated.   ESRD on HD Renal diet and carb modified with fluid restriction HD per nephrology  Possible UTI/asymptomatic bacteriuria UA urine positive for large leukocytes with >50 WBCs. UCx: Negative.  Completed antibiotics.  Second-degree heart block New finding on EKG when compared to previous tracings. Continue to monitor Consult cardiology if needed  Essential hypertension Blood pressure seen to be elevated up to 191/71. Continue Norvasc, Coreg,  clonidine, torsemide and hydralazine.  Diabetes  mellitus type 2 uncontrolled with hyperglycemia with renal complication Last hemoglobin A1c noted to be 6.9 in 09/2018. Hypoglycemic protocol Continue home Levemir 10 units at bedtime CBGs with moderate sliding scale insulin Blood glucose elevated , partly due to steroids, d/c steroids, add on meal coverage  Anemia of chronic disease Hemoglobin appears stable Continue to monitor  Hypokalemia Renal diet and carb modified with fluid restriction HD per nephrology  Deconditioning PT/OT consults recommends SNF.  Indigestion. Continue Pepcid and add Maalox.  DVT prophylaxis: heparin Code Status: FULL had a prolonged goals of care discussion with this patient as well as his son.  Patient currently wants everything to be done. D/w sister on 04/24/2019  Disposition Plan: needs snf placement If gets better can send first COVID repeat testing on Thursday to see if he is negative.    Consultants:   Nephrology  Palliative care    Antimicrobials:  . Rocephin    Subjective: Patient is hard of hearing their communication was made with the use of written notes.   Still not feeling better.  Continues to have shortness of breath and cough.  No nausea no vomiting.  RN reports that the patient gets hypoxic with minimal exertion.  He is on 3liter o2, rr range from 20-30 He is smiling when I exam him , he reports feeling better  Objective: Vitals:   04/24/19 0349 04/24/19 0438 04/24/19 0745 04/24/19 1248  BP: (!) 159/76  (!) 166/86 119/68  Pulse: 87  (!) 104 96  Resp: (!) 22  (!) 28 (!) 22  Temp: 98.4 F (36.9 C)  98.3 F (36.8 C) 98.4 F (36.9 C)  TempSrc: Oral  Oral Oral  SpO2: 97%  97% 96%  Weight:  108.1 kg    Height:        Intake/Output Summary (Last 24 hours) at 04/24/2019 1629 Last data filed at 04/24/2019 1446 Gross per 24 hour  Intake 480 ml  Output 75 ml  Net 405 ml   Filed Weights   04/23/19 0710 04/23/19 1120 04/24/19 0438  Weight: 111.7 kg 109.1 kg 108.1  kg    Examination:  General: 78 y.o. male resting in bed in NAD, smiling  Cardiovascular: RRR, +S1, S2, no m/g/r, equal pulses throughout, RUE fistula Respiratory: decreased at bases, tachypnic, no accessory muscle use GI: BS+, NDNT, no masses noted, no organomegaly noted MSK: No e/c/c Skin: No rashes, bruises, ulcerations noted Neuro: A&O x 3, no focal deficits, very hard of hearing Psyc: Appropriate interaction and affect, calm/cooperative    Data Reviewed: I have personally reviewed following labs and imaging studies.  CBC: Recent Labs  Lab 04/21/19 0809 04/23/19 0719  WBC 7.4 14.1*  HGB 10.7* 10.7*  HCT 32.5* 32.4*  MCV 98.2 95.9  PLT 325 263*   Basic Metabolic Panel: Recent Labs  Lab 04/20/19 0516 04/21/19 0458 04/22/19 1006 04/23/19 0426 04/24/19 0502  NA 136 133* 135 134* 135  K 4.1 4.6 4.0 4.7 3.9  CL 95* 92* 92* 91* 95*  CO2 26 25 25 24 25   GLUCOSE 181* 434* 264* 347* 254*  BUN 41* 65* 50* 77* 57*  CREATININE 9.01* 11.43* 8.68* 10.28* 7.56*  CALCIUM 7.9* 8.1* 8.6* 8.4* 8.4*  PHOS 3.9 4.5 3.8 4.8* 5.5*   GFR: Estimated Creatinine Clearance: 10.6 mL/min (A) (by C-G formula based on SCr of 7.56 mg/dL (H)). Liver Function Tests: Recent Labs  Lab 04/20/19 0516 04/21/19 0458 04/22/19 1006 04/23/19 0426 04/24/19  0502  ALBUMIN 2.3* 2.2* 2.6* 2.4* 2.5*   No results for input(s): LIPASE, AMYLASE in the last 168 hours. No results for input(s): AMMONIA in the last 168 hours. Coagulation Profile: No results for input(s): INR, PROTIME in the last 168 hours. Cardiac Enzymes: No results for input(s): CKTOTAL, CKMB, CKMBINDEX, TROPONINI in the last 168 hours. BNP (last 3 results) No results for input(s): PROBNP in the last 8760 hours. HbA1C: No results for input(s): HGBA1C in the last 72 hours. CBG: Recent Labs  Lab 04/23/19 1213 04/23/19 1624 04/23/19 2108 04/24/19 0744 04/24/19 1252  GLUCAP 206* 351* 401* 238* 127*   Lipid Profile: No results  for input(s): CHOL, HDL, LDLCALC, TRIG, CHOLHDL, LDLDIRECT in the last 72 hours. Thyroid Function Tests: No results for input(s): TSH, T4TOTAL, FREET4, T3FREE, THYROIDAB in the last 72 hours. Anemia Panel: Recent Labs    04/23/19 0426 04/24/19 0502  FERRITIN 3,241* 2,670*   Sepsis Labs: No results for input(s): PROCALCITON, LATICACIDVEN in the last 168 hours.  Recent Results (from the past 240 hour(s))  Urine Culture     Status: None   Collection Time: 04/15/19  1:47 AM  Result Value Ref Range Status   Specimen Description URINE, CLEAN CATCH  Final   Special Requests NONE  Final   Culture   Final    NO GROWTH Performed at [facility], [address], [place], [place] [address]    Report Status 04/16/2019 FINAL  Final  Culture, blood (routine x 2)     Status: None   Collection Time: 04/17/19  8:50 AM  Result Value Ref Range Status   Specimen Description BLOOD LEFT HAND  Final   Special Requests   Final    BOTTLES DRAWN AEROBIC ONLY Blood Culture adequate volume   Culture   Final    NO GROWTH 5 DAYS Performed at [facility], [place] [address], [place], [place] [address]    Report Status 04/22/2019 FINAL  Final  Culture, blood (routine x 2)     Status: None   Collection Time: 04/17/19  8:55 AM  Result Value Ref Range Status   Specimen Description BLOOD LEFT HAND  Final   Special Requests   Final    BOTTLES DRAWN AEROBIC ONLY Blood Culture adequate volume   Culture   Final    NO GROWTH 5 DAYS Performed at [facility], [place] [address] [place],  [address]    Report Status 04/22/2019 FINAL  Final         Radiology Studies: No results found.      Scheduled Meds: . atorvastatin  40 mg Oral QHS  . benzonatate  100 mg Oral TID  . carvedilol  6.25 mg Oral BID WC  . Chlorhexidine Gluconate Cloth  6 each Topical Q0600  . cloNIDine  0.1 mg Oral Daily  . clopidogrel  75 mg Oral Daily  . docusate sodium  100 mg Oral BID  . dorzolamide-timolol   1 drop Both Eyes BID  . famotidine  20 mg Oral Daily  . heparin  7,500 Units Subcutaneous Q8H  . insulin aspart  0-15 Units Subcutaneous TID WC  . insulin aspart  8 Units Subcutaneous TID WC  . insulin detemir  15 Units Subcutaneous QHS  . Ipratropium-Albuterol  1 puff Inhalation TID  . methylPREDNISolone (SOLU-MEDROL) injection  60 mg Intravenous Daily  . pantoprazole  40 mg Oral BID  . sodium chloride flush  3 mL Intravenous Q12H  .  vitamin C  500 mg Oral Daily  . zinc sulfate  220 mg Oral Daily   Continuous Infusions: . sodium chloride    . sodium chloride       LOS: 12 days    Time spent: 25 minutes spent in the coordination of care today.   Florencia Reasons  MD PhD  If 7PM-7AM, please contact night-coverage [email or web address] Password [facility] 04/24/2019, 4:29 PM

## 2019-04-24 NOTE — Progress Notes (Signed)
Physical Therapy Treatment Patient Details Name: Cory Miller MRN: [id number] DOB: [birth year] Today's Date: 04/24/2019    History of Present Illness Cory Miller is a 78 y.o. 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  Reports having fevers up to 101 F at home. found to be Covid 19 +    PT Comments    Patient doing well with therapy today, ambulating further distances. desat to 81% on RA with 40', returned to 3L quickly rose back to 95%. SPC use and min guard. With more progress next session may be able to progress to HHPT if he has support at home. Will update recs if appropriate next session .     Follow Up Recommendations  SNF;Other (comment)(HHPT with more progression )     Equipment Recommendations  Other (comment)    Recommendations for Other Services OT consult     Precautions / Restrictions Precautions Precautions: Fall Precaution Comments: Fall risk low (especially with RW), but present Restrictions Weight Bearing Restrictions: No    Mobility  Bed Mobility Overal bed mobility: Needs Assistance Bed Mobility: Supine to Sit     Supine to sit: Min guard     General bed mobility comments: Minguard assist for safety and lines  Transfers Overall transfer level: Needs assistance Equipment used: Straight cane Transfers: Sit to/from Stand Sit to Stand: Min assist         General transfer comment: minA for power up and management of lines  Ambulation/Gait Ambulation/Gait assistance: Min guard Gait Distance (Feet): 40 Feet Assistive device: Straight cane Gait Pattern/deviations: Step-through pattern     General Gait Details: several laps in room, patient ambulating . de sat on RA to 81%   Stairs             Wheelchair  Mobility    Modified Rankin (Stroke Patients Only)       Balance Overall balance assessment: Needs assistance   Sitting balance-Leahy Scale: Good     Standing balance support: Bilateral upper extremity supported;During functional activity Standing balance-Leahy Scale: Fair Standing balance comment: Relies on UE support on [facility]                            Cognition Arousal/Alertness: Awake/alert Behavior During Therapy: WFL for tasks assessed/performed Overall Cognitive Status: Difficult to assess                                        Exercises      General Comments        Pertinent Vitals/Pain      Home Living                      Prior Function            PT Goals (current goals can now be found in the care plan section) Acute Rehab PT Goals Patient Stated Goal: I want to go home PT Goal Formulation: With patient Time For Goal Achievement: 04/30/19 Potential to Achieve Goals: Good Progress towards PT goals: Progressing toward goals    Frequency    Min 3X/week      PT Plan Current plan  remains appropriate    Co-evaluation              AM-PAC PT "6 Clicks" Mobility   Outcome Measure  Help needed turning from your back to your side while in a flat bed without using bedrails?: None Help needed moving from lying on your back to sitting on the side of a flat bed without using bedrails?: A Little Help needed moving to and from a bed to a chair (including a wheelchair)?: A Little Help needed standing up from a chair using your arms (e.g., wheelchair or bedside chair)?: A Little Help needed to walk in hospital room?: A Little Help needed climbing 3-5 steps with a railing? : A Lot 6 Click Score: 18    End of Session Equipment Utilized During Treatment: Gait belt;Oxygen Activity Tolerance: Patient tolerated treatment well Patient left: in chair;with bed alarm set;with chair alarm set Nurse Communication:  Mobility status;Other (comment) PT Visit Diagnosis: Difficulty in walking, not elsewhere classified (R26.2);Other (comment)     Time: 1800-1820 PT Time Calculation (min) (ACUTE ONLY): 20 min  Charges:  $Gait Training: 8-22 mins                     Reinaldo Berber, PT, DPT Acute Rehabilitation Services Pager: [phone] Office: [phone]     Reinaldo Berber 04/24/2019, 6:43 PM

## 2019-04-24 NOTE — Progress Notes (Signed)
Inpatient Diabetes Program Recommendations  AACE/ADA: New Consensus Statement on Inpatient Glycemic Control (2015)  Target Ranges:  Prepandial:   less than 140 mg/dL      Peak postprandial:   less than 180 mg/dL (1-2 hours)      Critically ill patients:  140 - 180 mg/dL   Lab Results  Component Value Date   GLUCAP 238 (H) 04/24/2019   HGBA1C 6.9 (H) 10/18/2018    Review of Glycemic Control Results for Cory Miller, Cory Miller (MRN [id number]) as of 04/24/2019 10:56  Ref. Range 04/23/2019 12:13 04/23/2019 16:24 04/23/2019 21:08 04/24/2019 07:44  Glucose-Capillary Latest Ref Range: 70 - 99 mg/dL 206 (H) 351 (H) 401 (H) 238 (H)   Diabetes history: DM 2 Outpatient Diabetes medications: Levemir 10 units qhs, Novolog 10 units tid with meals  Current orders for Inpatient glycemic control: Levemir 15 units qhs, Novolog 0-15 units tid, Novolog 8 units tid meal coverage  Inpatient Diabetes Program Recommendations:    In the setting of steroids, consider increasing Levemir to 12 units bid and adding Novolog 0-5 units QHS. Also, if appropriate, consider adding A1C (last one was from 2019).  Thanks, Bronson Curb, MSN, RNC-OB Diabetes Coordinator [phone] (8a-5p)

## 2019-04-24 NOTE — Progress Notes (Signed)
Patient ID: Kaiden Pech, male   DOB: [birth year], 78 y.o.   MRN: [id number] S: No events overnight and tolerated HD well O:BP (!) 166/86 (BP Location: Left Arm)   Pulse (!) 104   Temp 98.3 F (36.8 C) (Oral)   Resp (!) 28   Ht 6\' 1"  (1.854 m)   Wt 108.1 kg   SpO2 97%   BMI 31.44 kg/m   Intake/Output Summary (Last 24 hours) at 04/24/2019 1032 Last data filed at 04/24/2019 0746 Gross per 24 hour  Intake -  Output 2075 ml  Net -2075 ml   Intake/Output: I/O last 3 completed shifts: In: -  Out: 2000 [Other:2000]  Intake/Output this shift:  Total I/O In: -  Out: 75 [Urine:75] Weight change: -0.9 kg Direct physical contact was not performed due to covid-19 infection and exam of the primary service was used to make clinical decisions.  Recent Labs  Lab 04/18/19 0653 04/19/19 0708 04/20/19 0516 04/21/19 0458 04/22/19 1006 04/23/19 0426 04/24/19 0502  NA 136 136 136 133* 135 134* 135  K 4.6 4.2 4.1 4.6 4.0 4.7 3.9  CL 98 95* 95* 92* 92* 91* 95*  CO2 24 25 26 25 25 24 25   GLUCOSE 198* 203* 181* 434* 264* 347* 254*  BUN 45* 24* 41* 65* 50* 77* 57*  CREATININE 8.13* 6.75* 9.01* 11.43* 8.68* 10.28* 7.56*  ALBUMIN 2.4* 2.2* 2.3* 2.2* 2.6* 2.4* 2.5*  CALCIUM 7.5* 7.9* 7.9* 8.1* 8.6* 8.4* 8.4*  PHOS 4.1 4.1 3.9 4.5 3.8 4.8* 5.5*   Liver Function Tests: Recent Labs  Lab 04/22/19 1006 04/23/19 0426 04/24/19 0502  ALBUMIN 2.6* 2.4* 2.5*   No results for input(s): LIPASE, AMYLASE in the last 168 hours. No results for input(s): AMMONIA in the last 168 hours. CBC: Recent Labs  Lab 04/21/19 0809 04/23/19 0719  WBC 7.4 14.1*  HGB 10.7* 10.7*  HCT 32.5* 32.4*  MCV 98.2 95.9  PLT 325 417*   Cardiac Enzymes: No results for input(s): CKTOTAL, CKMB, CKMBINDEX, TROPONINI in the last 168 hours. CBG: Recent Labs  Lab 04/22/19 2055 04/23/19 1213 04/23/19 1624 04/23/19 2108 04/24/19 0744  GLUCAP 174* 206* 351* 401* 238*    Iron Studies:  Recent Labs     04/24/19 0502  FERRITIN 2,670*   Studies/Results: No results found. [name] atorvastatin  40 mg Oral QHS  . benzonatate  100 mg Oral TID  . carvedilol  6.25 mg Oral BID WC  . Chlorhexidine Gluconate Cloth  6 each Topical Q0600  . cloNIDine  0.1 mg Oral Daily  . clopidogrel  75 mg Oral Daily  . docusate sodium  100 mg Oral BID  . dorzolamide-timolol  1 drop Both Eyes BID  . famotidine  20 mg Oral Daily  . heparin  7,500 Units Subcutaneous Q8H  . insulin aspart  0-15 Units Subcutaneous TID WC  . insulin aspart  8 Units Subcutaneous TID WC  . insulin detemir  15 Units Subcutaneous QHS  . Ipratropium-Albuterol  1 puff Inhalation TID  . methylPREDNISolone (SOLU-MEDROL) injection  60 mg Intravenous Daily  . pantoprazole  40 mg Oral BID  . sodium chloride flush  3 mL Intravenous Q12H  . vitamin C  500 mg Oral Daily  . zinc sulfate  220 mg Oral Daily    BMET    Component Value Date/Time   NA 135 04/24/2019 0502   K 3.9 04/24/2019 0502   CL 95 (L) 04/24/2019 0502   CO2 25 04/24/2019 0502  GLUCOSE 254 (H) 04/24/2019 0502   BUN 57 (H) 04/24/2019 0502   CREATININE 7.56 (H) 04/24/2019 0502   CALCIUM 8.4 (L) 04/24/2019 0502   GFRNONAA 6 (L) 04/24/2019 0502   GFRAA 7 (L) 04/24/2019 0502   CBC    Component Value Date/Time   WBC 14.1 (H) 04/23/2019 0719   RBC 3.38 (L) 04/23/2019 0719   HGB 10.7 (L) 04/23/2019 0719   HGB 11.9 (L) 04/12/2019 1513   HCT 32.4 (L) 04/23/2019 0719   PLT 417 (H) 04/23/2019 0719   MCV 95.9 04/23/2019 0719   MCH 31.7 04/23/2019 0719   MCHC 33.0 04/23/2019 0719   RDW 14.5 04/23/2019 0719   LYMPHSABS 1.0 04/17/2019 0409   MONOABS 0.5 04/17/2019 0409   EOSABS 0.0 04/17/2019 0409   BASOSABS 0.0 04/17/2019 0409     [facility] [phone]  MWF, BFR400 DFR600 AVF 4 hours EDW 116kg (new EDW closer to 112.5kg) 2K/3Ca  Assessment/Plan: 1. covid-19 pneumonia- on 2 L [place].  Will need to repeat swab to see if he is clear/noninfectious prior to  discharge  2. ESRD- cont with HD qMWF.  He is 5 kg below edw and BP's stable. Continue to challenge edw to help with hypoxia. 3. Anemia of ESRD- cont with ESA 4. SHPTH- stable 5. Second degree heart block- per primary  Donetta Potts, MD [facility] [phone]

## 2019-04-25 LAB — CBC
HCT: 35.2 % — ABNORMAL LOW (ref 39.0–52.0)
Hemoglobin: 11.6 g/dL — ABNORMAL LOW (ref 13.0–17.0)
MCH: 31.2 pg (ref 26.0–34.0)
MCHC: 33 g/dL (ref 30.0–36.0)
MCV: 94.6 fL (ref 80.0–100.0)
Platelets: 488 10*3/uL — ABNORMAL HIGH (ref 150–400)
RBC: 3.72 MIL/uL — ABNORMAL LOW (ref 4.22–5.81)
RDW: 14.4 % (ref 11.5–15.5)
WBC: 13.8 10*3/uL — ABNORMAL HIGH (ref 4.0–10.5)
nRBC: 0 % (ref 0.0–0.2)

## 2019-04-25 LAB — HEMOGLOBIN A1C
Hgb A1c MFr Bld: 7.8 % — ABNORMAL HIGH (ref 4.8–5.6)
Mean Plasma Glucose: 177.16 mg/dL

## 2019-04-25 LAB — RENAL FUNCTION PANEL
Albumin: 2.4 g/dL — ABNORMAL LOW (ref 3.5–5.0)
Anion gap: 19 — ABNORMAL HIGH (ref 5–15)
BUN: 84 mg/dL — ABNORMAL HIGH (ref 8–23)
CO2: 23 mmol/L (ref 22–32)
Calcium: 8.2 mg/dL — ABNORMAL LOW (ref 8.9–10.3)
Chloride: 94 mmol/L — ABNORMAL LOW (ref 98–111)
Creatinine, Ser: 9.69 mg/dL — ABNORMAL HIGH (ref 0.61–1.24)
GFR calc Af Amer: 5 mL/min — ABNORMAL LOW (ref >60)
GFR calc non Af Amer: 5 mL/min — ABNORMAL LOW (ref >60)
Glucose, Bld: 202 mg/dL — ABNORMAL HIGH (ref 70–99)
Phosphorus: 5.8 mg/dL — ABNORMAL HIGH (ref 2.5–4.6)
Potassium: 3.8 mmol/L (ref 3.5–5.1)
Sodium: 136 mmol/L (ref 135–145)

## 2019-04-25 LAB — GLUCOSE, CAPILLARY
Glucose-Capillary: 161 mg/dL — ABNORMAL HIGH (ref 70–99)
Glucose-Capillary: 137 mg/dL — ABNORMAL HIGH (ref 70–99)
Glucose-Capillary: 163 mg/dL — ABNORMAL HIGH (ref 70–99)
Glucose-Capillary: 174 mg/dL — ABNORMAL HIGH (ref 70–99)

## 2019-04-25 LAB — D-DIMER, QUANTITATIVE: D-Dimer, Quant: 0.88 ug/mL-FEU — ABNORMAL HIGH (ref 0.00–0.50)

## 2019-04-25 LAB — FERRITIN: Ferritin: 1970 ng/mL — ABNORMAL HIGH (ref 24–336)

## 2019-04-25 LAB — C-REACTIVE PROTEIN: CRP: 3.9 mg/dL — ABNORMAL HIGH (ref <1.0)

## 2019-04-25 MED ORDER — LOPERAMIDE HCL 2 MG PO CAPS
4.0000 mg | ORAL_CAPSULE | Freq: Once | ORAL | Status: AC
  Administered 2019-04-25: 4 mg via ORAL
  Filled 2019-04-25: qty 2

## 2019-04-25 NOTE — Progress Notes (Addendum)
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 Reports having fevers up to 101 F at home. Patient is not normally on oxygen, but found himself getting easily winded even with minimal exertion. Associated symptoms included sore throat, nasal congestion, diarrhea earlier in the week, and intermittent chest pain. He had continued to go to hemodialysis sessions and had a near complete dialysis session on 5/15. However, was sent to the emergency department for further evaluation due to symptoms. On admission  (To [facility] )patient was noted to blood pressures up to 191/71, pulse 107, respirations24, O2 saturations 94% on 2 L nasal cannula oxygen. Lab work revealed WBC 5.9, potassium 3.6, CO2 29, BUN 20, creatinine 3.09, and. Lactic acid 1.4. Chest x-ray showed chronic cardiomegaly with basilar congestion without any focal airspace opacity noted. COVID-19 testing was positive. Transfer was requested for need of hemodialysis and COVID-19 testing was positive on 5/13 at [facility] . Patient was accepted to a progressive bed here at [facility] for need of hemodialysis.  Assessment & Plan: Acute COVID-19 Viral illness,  Lab Results  Component Value Date   SARSCOV2NAA POSITIVE (A) 04/24/2019   SARSCOV2NAA POSITIVE (A) 04/11/2019   RVP not done No lymphopenia, Low Procalcitonin CXR: hazy bilateral peripheral opacities  Recent Labs    04/23/19 0426 04/24/19 0502 04/25/19 0622  DDIMER 0.90* 0.97* 0.88*  FERRITIN 3,241* 2,670* 1,970*  CRP 8.7* 4.4* 3.9*   Tmax:  98.9 Oxygen requirements: 3 LPM, Drops to  70% on 3 L with exertion and needs 5 L.  Antibiotics:none Diuretics: none, on HD Vitamin C and Zinc: Ordered DVT Prophylaxis: Subcutaneous Heparin  Dose increased due to worsening of inflammatory markers Remdesivir: Contraindicated in ESRD patient Steroids: Starting at 40 mg IV Solu-Medrol dose, finished total of 5 days treatment, patient appear improving, now blood glucose elevated, steroids d/ced on 5/28  Actemra: Currently holding, may require if the condition worsens Prone positioning: Patient encouraged to stay in prone position as much as possible.  During this encounter: Patient Isolation: Droplet + Contact HCP PPE: CAPR, gown. gloves Patient PPE: None  Appear slightly improved today, will repeat covid test today, will need to be tested negative for discharge due to the need of outpatient dialysis Anticipating improvement  The treatment plan and use of medications and known side effects were discussed with patient/family. It was clearly explained that there is no proven definitive treatment for COVID-19 infection yet. Any medications used here are based on case reports/anecdotal data which are not peer-reviewed and has not been studied using randomized control trials.  Complete risks and long-term side effects are unknown, however in the best clinical judgment they seem to be of some clinical benefit rather than medical risks.  Patient/family agree with the treatment plan and want to receive these treatments as indicated.   ESRD on HD Renal diet and carb modified with fluid restriction HD per nephrology  Possible UTI/asymptomatic bacteriuria UA urine positive for large leukocytes with >50 WBCs. UCx: Negative.  Completed antibiotics.  Second-degree heart block New finding on EKG when compared to previous tracings. Continue to monitor Consult cardiology if needed  Essential hypertension Blood pressure seen to  be elevated up to 191/71 initially on presentation Have been on  Norvasc, Coreg, clonidine, torsemide and hydralazine. Currently on coreg only  Diabetes mellitus type 2 uncontrolled with hyperglycemia with renal complication Last hemoglobin A1c noted to be 6.9 in 09/2018. Hypoglycemic protocol Continue home Levemir 10 units at bedtime CBGs with moderate sliding scale insulin Blood glucose elevated , partly due to steroids, d/c steroids, add on meal coverage  Anemia of chronic disease Hemoglobin appears stable Continue to monitor  Hypokalemia Renal diet and carb modified with fluid restriction HD per nephrology  Deconditioning PT/OT consults recommends SNF.  Indigestion. Continue Pepcid and add Maalox.  DVT prophylaxis: heparin Code Status: FULL had a prolonged goals of care discussion with this patient as well as his son.  Patient currently wants everything to be done. D/w sister on 04/25/2019  Disposition Plan: needs snf placement, however, repeat covid testing on 5/28 remain positive     Consultants:   Nephrology  Palliative care    Antimicrobials:  . Rocephin    Subjective: Patient is hard of hearing their communication was made with the use of written notes.   Still not feeling better.  Continues to have shortness of breath and cough.  No nausea no vomiting.  RN reports that the patient gets hypoxic with minimal exertion.  He is on 3liter o2, rr range from 20-30 He is smiling when I exam him , he reports feeling better  Objective: Vitals:   04/25/19 1003 04/25/19 1030 04/25/19 1100 04/25/19 1134  BP: 90/65 (!) 87/40 (!) 90/56 (!) 100/48  Pulse: (!) 101 (!) 109 77 99  Resp:   (!) 26 (!) 28  Temp:    (!) 97 F (36.1 C)  TempSrc:    Axillary  SpO2:    96%  Weight:    109.4 kg  Height:        Intake/Output Summary (Last 24 hours) at 04/25/2019 1456 Last data filed at 04/25/2019 1120 Gross per 24 hour  Intake 60 ml  Output 585 ml  Net -525 ml   Filed Weights   04/24/19 0438 04/25/19 0715 04/25/19 1134   Weight: 108.1 kg 109.8 kg 109.4 kg    Examination:  General: 78 y.o. male resting in bed in NAD, smiling  Cardiovascular: RRR, +S1, S2, no m/g/r, equal pulses throughout, RUE fistula Respiratory: decreased at bases, tachypnic, no accessory muscle use GI: BS+, NDNT, no masses noted, no organomegaly noted MSK: No e/c/c Skin: No rashes, bruises, ulcerations noted Neuro: A&O x 3, no focal deficits, very hard of hearing Psyc: Appropriate interaction and affect, calm/cooperative    Data Reviewed: I have personally reviewed following labs and imaging studies.  CBC: Recent Labs  Lab 04/21/19 0809 04/23/19 0719 04/25/19 0622  WBC 7.4 14.1* 13.8*  HGB 10.7* 10.7* 11.6*  HCT 32.5* 32.4* 35.2*  MCV 98.2 95.9 94.6  PLT 325 417* 564*   Basic Metabolic Panel: Recent Labs  Lab 04/21/19 0458 04/22/19 1006 04/23/19 0426 04/24/19 0502 04/25/19 0621  NA 133* 135 134* 135 136  K 4.6 4.0 4.7 3.9 3.8  CL 92* 92* 91* 95* 94*  CO2 25 25 24 25 23   GLUCOSE 434* 264* 347* 254* 202*  BUN 65* 50* 77* 57* 84*  CREATININE 11.43* 8.68* 10.28* 7.56* 9.69*  CALCIUM 8.1* 8.6* 8.4* 8.4* 8.2*  PHOS 4.5 3.8 4.8* 5.5* 5.8*   GFR: Estimated Creatinine Clearance: 8.3 mL/min (A) (by C-G formula based on SCr of 9.69 mg/dL (H)). Liver Function Tests: Recent  Labs  Lab 04/21/19 0458 04/22/19 1006 04/23/19 0426 04/24/19 0502 04/25/19 0621  ALBUMIN 2.2* 2.6* 2.4* 2.5* 2.4*   No results for input(s): LIPASE, AMYLASE in the last 168 hours. No results for input(s): AMMONIA in the last 168 hours. Coagulation Profile: No results for input(s): INR, PROTIME in the last 168 hours. Cardiac Enzymes: No results for input(s): CKTOTAL, CKMB, CKMBINDEX, TROPONINI in the last 168 hours. BNP (last 3 results) No results for input(s): PROBNP in the last 8760 hours. HbA1C: Recent Labs    04/25/19 0500  HGBA1C 7.8*   CBG: Recent Labs  Lab 04/24/19 1252 04/24/19 1709 04/24/19 2048 04/25/19 0820 04/25/19  1146  GLUCAP 127* 173* 163* 161* 137*   Lipid Profile: No results for input(s): CHOL, HDL, LDLCALC, TRIG, CHOLHDL, LDLDIRECT in the last 72 hours. Thyroid Function Tests: No results for input(s): TSH, T4TOTAL, FREET4, T3FREE, THYROIDAB in the last 72 hours. Anemia Panel: Recent Labs    04/24/19 0502 04/25/19 0622  FERRITIN 2,670* 1,970*   Sepsis Labs: No results for input(s): PROCALCITON, LATICACIDVEN in the last 168 hours.  Recent Results (from the past 240 hour(s))  Culture, blood (routine x 2)     Status: None   Collection Time: 04/17/19  8:50 AM  Result Value Ref Range Status   Specimen Description BLOOD LEFT HAND  Final   Special Requests   Final    BOTTLES DRAWN AEROBIC ONLY Blood Culture adequate volume   Culture   Final    NO GROWTH 5 DAYS Performed at [facility], [address], [place], [place] [address]    Report Status 04/22/2019 FINAL  Final  Culture, blood (routine x 2)     Status: None   Collection Time: 04/17/19  8:55 AM  Result Value Ref Range Status   Specimen Description BLOOD LEFT HAND  Final   Special Requests   Final    BOTTLES DRAWN AEROBIC ONLY Blood Culture adequate volume   Culture   Final    NO GROWTH 5 DAYS Performed at [facility], [place] [address] [place], [place] [address]    Report Status 04/22/2019 FINAL  Final  SARS Coronavirus 2 (CEPHEID- Performed in [facility] lab), Hosp Order     Status: Abnormal   Collection Time: 04/24/19  5:20 PM  Result Value Ref Range Status   SARS Coronavirus 2 POSITIVE (A) NEGATIVE Final    Comment: RESULT CALLED TO, READ BACK BY AND VERIFIED WITH: B RODDY RN 04/24/19 1855 JDW (NOTE) If result is NEGATIVE SARS-CoV-2 target nucleic acids are NOT DETECTED. The SARS-CoV-2 RNA is generally detectable in upper and lower  respiratory specimens during the acute phase of infection. The lowest  concentration of SARS-CoV-2 viral copies this assay can detect is 250  copies / mL. A negative  result does not preclude SARS-CoV-2 infection  and should not be used as the sole basis for treatment or other  patient management decisions.  A negative result may occur with  improper specimen collection / handling, submission of specimen other  than nasopharyngeal swab, presence of viral mutation(s) within the  areas targeted by this assay, and inadequate number of viral copies  (<250 copies / mL). A negative result must be combined with clinical  observations, patient history, and epidemiological information. If result is POSITIVE SARS-CoV-2 target nucleic acids are DETECTED. The SARS- CoV-2 RNA is generally detectable in upper and lower  respiratory specimens during the acute phase of infection.  Positive  results are indicative of active infection with SARS-CoV-2.  Clinical  correlation with patient history and other diagnostic information is  necessary to determine patient infection status.  Positive results do  not rule out bacterial infection or co-infection with other viruses. If result is PRESUMPTIVE POSTIVE SARS-CoV-2 nucleic acids MAY BE PRESENT.   A presumptive positive result was obtained on the submitted specimen  and confirmed on repeat testing.  While 2019 novel coronavirus  (SARS-CoV-2) nucleic acids may be present in the submitted sample  additional confirmatory testing may be necessary for epidemiological  and / or clinical management purposes  to differentiate between  SARS-CoV-2 and other Sarbecovirus currently known to infect humans.  If clinically indicated additional testing with an alternate test  methodology [phone]) is advised . The SARS-CoV-2 RNA is generally  detectable in upper and lower respiratory specimens during the acute  phase of infection. The expected result is Negative. Fact Sheet for Patients:  StrictlyIdeas.no Fact Sheet for Healthcare Providers: BankingDealers.co.za This test is not yet  approved or cleared by the [place] FDA and has been authorized for detection and/or diagnosis of SARS-CoV-2 by FDA under an Emergency Use Authorization (EUA).  This EUA will remain in effect (meaning this test can be used) for the duration of the COVID-19 declaration under Section 564(b)(1) of the Act, 21 U.S.C. section 360bbb-3(b)(1), unless the authorization is terminated or revoked sooner. Performed at [facility], [place] [address], [place], [place] [address]          Radiology Studies: No results found.      Scheduled Meds: . atorvastatin  40 mg Oral QHS  . benzonatate  100 mg Oral TID  . carvedilol  6.25 mg Oral BID WC  . Chlorhexidine Gluconate Cloth  6 each Topical Q0600  . cloNIDine  0.1 mg Oral Daily  . clopidogrel  75 mg Oral Daily  . docusate sodium  100 mg Oral BID  . dorzolamide-timolol  1 drop Both Eyes BID  . famotidine  20 mg Oral Daily  . heparin  7,500 Units Subcutaneous Q8H  . insulin aspart  0-15 Units Subcutaneous TID WC  . insulin aspart  8 Units Subcutaneous TID WC  . insulin detemir  15 Units Subcutaneous QHS  . Ipratropium-Albuterol  1 puff Inhalation TID  . pantoprazole  40 mg Oral BID  . sodium chloride flush  3 mL Intravenous Q12H  . vitamin C  500 mg Oral Daily  . zinc sulfate  220 mg Oral Daily   Continuous Infusions: . sodium chloride    . sodium chloride       LOS: 13 days    Time spent: 25 minutes spent in the coordination of care today.   Florencia Reasons  MD PhD  If 7PM-7AM, please contact night-coverage [email or web address] Password [facility] 04/25/2019, 2:56 PM

## 2019-04-25 NOTE — Progress Notes (Signed)
Patient complaining of upset stomach and diarrhea. Paged Juanda Crumble, NP. Imodium ordered.

## 2019-04-25 NOTE — Progress Notes (Signed)
PT Cancellation Note  Patient Details Name: Cory Miller MRN: [id number] DOB: 1941-02-10   Cancelled Treatment:     pt declining therapy today, citing diarrhea. Will cont to follow.  Reinaldo Berber, PT, DPT Acute Rehabilitation Services Pager: [phone] Office: [place] 04/25/2019, 4:51 PM

## 2019-04-25 NOTE — Procedures (Signed)
I was present at this dialysis session. I have reviewed the session itself and made appropriate changes.   Vital signs in last 24 hours:  Temp:  [97.1 F (36.2 C)-98.4 F (36.9 C)] 97.1 F (36.2 C) (05/29 0715) Pulse Rate:  [87-101] 100 (05/29 0830) Resp:  [22-28] 28 (05/29 0715) BP: (96-161)/(56-71) 96/56 (05/29 0830) SpO2:  [92 %-96 %] 93 % (05/29 0715) Weight:  [109.8 kg] 109.8 kg (05/29 0715) Weight change:  Filed Weights   04/23/19 1120 04/24/19 0438 04/25/19 0715  Weight: 109.1 kg 108.1 kg 109.8 kg    Recent Labs  Lab 04/25/19 0621  NA 136  K 3.8  CL 94*  CO2 23  GLUCOSE 202*  BUN 84*  CREATININE 9.69*  CALCIUM 8.2*  PHOS 5.8*    Recent Labs  Lab 04/21/19 0809 04/23/19 0719 04/25/19 0622  WBC 7.4 14.1* 13.8*  HGB 10.7* 10.7* 11.6*  HCT 32.5* 32.4* 35.2*  MCV 98.2 95.9 94.6  PLT 325 417* 488*    Scheduled Meds: . atorvastatin  40 mg Oral QHS  . benzonatate  100 mg Oral TID  . carvedilol  6.25 mg Oral BID WC  . Chlorhexidine Gluconate Cloth  6 each Topical Q0600  . cloNIDine  0.1 mg Oral Daily  . clopidogrel  75 mg Oral Daily  . docusate sodium  100 mg Oral BID  . dorzolamide-timolol  1 drop Both Eyes BID  . famotidine  20 mg Oral Daily  . heparin  7,500 Units Subcutaneous Q8H  . insulin aspart  0-15 Units Subcutaneous TID WC  . insulin aspart  8 Units Subcutaneous TID WC  . insulin detemir  15 Units Subcutaneous QHS  . Ipratropium-Albuterol  1 puff Inhalation TID  . pantoprazole  40 mg Oral BID  . sodium chloride flush  3 mL Intravenous Q12H  . vitamin C  500 mg Oral Daily  . zinc sulfate  220 mg Oral Daily   Continuous Infusions: . sodium chloride    . sodium chloride     PRN Meds:.sodium chloride, sodium chloride, acetaminophen, alteplase, alum & mag hydroxide-simeth, butalbital-acetaminophen-caffeine, chlorpheniramine-HYDROcodone, guaiFENesin-dextromethorphan, heparin, heparin, hydrOXYzine, Ipratropium-Albuterol, lidocaine (PF),  lidocaine-prilocaine, ondansetron **OR** ondansetron (ZOFRAN) IV, oxyCODONE-acetaminophen, pentafluoroprop-tetrafluoroeth, simethicone, zolpidem   Donetta Potts,  MD 04/25/2019, 9:16 AM

## 2019-04-25 NOTE — Progress Notes (Signed)
OT Cancellation Note  Patient Details Name: Cory Miller MRN: [id number] DOB: 09/06/1941   Cancelled Treatment:    Reason Eval/Treat Not Completed: Patient at procedure or test/ unavailable (HD); will follow up for OT treatment as schedule permits.  Lou Cal, OT Supplemental Rehabilitation Services Pager [phone] Office [place] 04/25/2019, 10:08 AM

## 2019-04-26 LAB — RENAL FUNCTION PANEL
Albumin: 2.4 g/dL — ABNORMAL LOW (ref 3.5–5.0)
Anion gap: 22 — ABNORMAL HIGH (ref 5–15)
BUN: 54 mg/dL — ABNORMAL HIGH (ref 8–23)
CO2: 19 mmol/L — ABNORMAL LOW (ref 22–32)
Calcium: 8.2 mg/dL — ABNORMAL LOW (ref 8.9–10.3)
Chloride: 95 mmol/L — ABNORMAL LOW (ref 98–111)
Creatinine, Ser: 7.59 mg/dL — ABNORMAL HIGH (ref 0.61–1.24)
GFR calc Af Amer: 7 mL/min — ABNORMAL LOW (ref >60)
GFR calc non Af Amer: 6 mL/min — ABNORMAL LOW (ref >60)
Glucose, Bld: 152 mg/dL — ABNORMAL HIGH (ref 70–99)
Phosphorus: 5.7 mg/dL — ABNORMAL HIGH (ref 2.5–4.6)
Potassium: 4.5 mmol/L (ref 3.5–5.1)
Sodium: 136 mmol/L (ref 135–145)

## 2019-04-26 LAB — GLUCOSE, CAPILLARY
Glucose-Capillary: 182 mg/dL — ABNORMAL HIGH (ref 70–99)
Glucose-Capillary: 189 mg/dL — ABNORMAL HIGH (ref 70–99)
Glucose-Capillary: 238 mg/dL — ABNORMAL HIGH (ref 70–99)
Glucose-Capillary: 142 mg/dL — ABNORMAL HIGH (ref 70–99)

## 2019-04-26 LAB — FERRITIN: Ferritin: 1860 ng/mL — ABNORMAL HIGH (ref 24–336)

## 2019-04-26 LAB — D-DIMER, QUANTITATIVE: D-Dimer, Quant: 0.68 ug/mL-FEU — ABNORMAL HIGH (ref 0.00–0.50)

## 2019-04-26 LAB — C-REACTIVE PROTEIN: CRP: 5.8 mg/dL — ABNORMAL HIGH (ref <1.0)

## 2019-04-26 NOTE — Progress Notes (Signed)
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 Reports having fevers up to 101 F at home. Patient is not normally on oxygen, but found himself getting easily winded even with minimal exertion. Associated symptoms included sore throat, nasal congestion, diarrhea earlier in the week, and intermittent chest pain. He had continued to go to hemodialysis sessions and had a near complete dialysis session on 5/15. However, was sent to the emergency department for further evaluation due to symptoms. On admission  (To [facility] )patient was noted to blood pressures up to 191/71, pulse 107, respirations24, O2 saturations 94% on 2 L nasal cannula oxygen. Lab work revealed WBC 5.9, potassium 3.6, CO2 29, BUN 20, creatinine 3.09, and. Lactic acid 1.4. Chest x-ray showed chronic cardiomegaly with basilar congestion without any focal airspace opacity noted. COVID-19 testing was positive. Transfer was requested for need of hemodialysis and COVID-19 testing was positive on 5/13 at [place] . Patient was accepted to a progressive bed here at [facility] for need of hemodialysis.  Assessment & Plan: Acute COVID-19 Viral illness,  Lab Results  Component Value Date   SARSCOV2NAA POSITIVE (A) 04/24/2019   SARSCOV2NAA POSITIVE (A) 04/11/2019   RVP not done No lymphopenia, Low Procalcitonin CXR: hazy bilateral peripheral opacities  Recent Labs    04/24/19 0502 04/25/19 0622 04/26/19 0640  DDIMER 0.97* 0.88* 0.68*  FERRITIN 2,670* 1,970* 1,860*  CRP 4.4* 3.9* 5.8*   Tmax:  98 Oxygen requirements: 3 LPM, Drops to  70% on 3 L with exertion and needs 5 L.  Antibiotics:none Diuretics: none, on HD Vitamin C and Zinc: Ordered DVT Prophylaxis: Subcutaneous Heparin  Dose increased due to worsening of inflammatory markers Remdesivir: Contraindicated in ESRD patient Steroids: Starting at 40 mg IV Solu-Medrol dose, finished total of 5 days treatment, patient appear improving, now blood glucose elevated, steroids d/ced on 5/28  Actemra: Currently holding, may require if the condition worsens Prone positioning: Patient encouraged to stay in prone position as much as possible.  During this encounter: Patient Isolation: Droplet + Contact HCP PPE: CAPR, gown. gloves Patient PPE: None  Appear slightly improved today, will repeat covid test today, will need to be tested negative for discharge due to the need of outpatient dialysis Anticipating improvement   ESRD on HD Renal diet and carb modified with fluid restriction HD per nephrology  Possible UTI/asymptomatic bacteriuria UA urine positive for large leukocytes with >50 WBCs. UCx: Negative.  Completed antibiotics.  Second-degree heart block New finding on EKG when compared to previous tracings. Continue to monitor, does not appear symptomatic, will repeat ekg tomorrow Consult cardiology if needed  Essential hypertension Blood pressure seen to be elevated up to 191/71 initially on presentation Have been on Norvasc, Coreg, clonidine, torsemide and hydralazine. Currently on coreg only  Diabetes mellitus type 2 uncontrolled with hyperglycemia with renal complication Last hemoglobin A1c noted to be 6.9 in 09/2018. Hypoglycemic protocol Continue home Levemir 10 units at bedtime CBGs with moderate sliding scale insulin Blood glucose elevated , partly due to steroids, d/c steroids, add on meal coverage  Anemia of chronic disease Hemoglobin appears stable Continue to monitor  Hypokalemia Renal diet and carb modified  with fluid restriction HD  per nephrology  Deconditioning PT/OT consults recommends SNF.  Indigestion. Continue Pepcid and add Maalox.  My colleague Dr Posey Pronto has goals of care discussion with the patient and sister, confirmed full code  DVT prophylaxis: heparin Code Status: FULL Disposition Plan: needs snf placement with dialysis capacity , however, repeat covid testing on 5/28 remain positive     Consultants:   Nephrology  Palliative care    Antimicrobials:  . Rocephin    Subjective: He is hard of hearing,  communication was made with the use of written notes.   He states he is  feeling better today.   The suction helped when he cough, he appear to cough less  No nausea no vomiting.   RN reports that the patient gets hypoxic with minimal exertion.  He is on 4-5liter o2, rr range from 20-30 He does not appear in distress at rest, I did not hear cough during encounter  Objective: Vitals:   04/26/19 0500 04/26/19 0530 04/26/19 0700 04/26/19 1600  BP:   (!) 142/62 136/68  Pulse:      Resp:      Temp:   98 F (36.7 C) 97.8 F (36.6 C)  TempSrc:   Oral Oral  SpO2:  100%    Weight: 109 kg     Height:        Intake/Output Summary (Last 24 hours) at 04/26/2019 1737 Last data filed at 04/26/2019 1700 Gross per 24 hour  Intake 1200 ml  Output -  Net 1200 ml   Filed Weights   04/25/19 0715 04/25/19 1134 04/26/19 0500  Weight: 109.8 kg 109.4 kg 109 kg    Examination:  General: 78 y.o. male resting in bed in NAD, smiling  Cardiovascular: RRR, +S1, S2, no m/g/r, equal pulses throughout, RUE fistula Respiratory: decreased at bases, tachypnic, no accessory muscle use GI: BS+, NDNT, no masses noted, no organomegaly noted MSK: No e/c/c Skin: No rashes, bruises, ulcerations noted Neuro: A&O x 3, no focal deficits, very hard of hearing Psyc: Appropriate interaction and affect, calm/cooperative    Data Reviewed: I have personally reviewed following labs and imaging studies.  CBC:  Recent Labs  Lab 05/11/2019 0809 04/23/19 0719 04/25/19 0622  WBC 7.4 14.1* 13.8*  HGB 10.7* 10.7* 11.6*  HCT 32.5* 32.4* 35.2*  MCV 98.2 95.9 94.6  PLT 325 417* 948*   Basic Metabolic Panel: Recent Labs  Lab 04/22/19 1006 04/23/19 0426 04/24/19 0502 04/25/19 0621 04/26/19 0640  NA 135 134* 135 136 136  K 4.0 4.7 3.9 3.8 4.5  CL 92* 91* 95* 94* 95*  CO2 25 24 25 23  19*  GLUCOSE 264* 347* 254* 202* 152*  BUN 50* 77* 57* 84* 54*  CREATININE 8.68* 10.28* 7.56* 9.69* 7.59*  CALCIUM 8.6* 8.4* 8.4* 8.2* 8.2*  PHOS 3.8 4.8* 5.5* 5.8* 5.7*   GFR: Estimated Creatinine Clearance: 10.5 mL/min (A) (by C-G formula based on SCr of 7.59 mg/dL (H)). Liver Function Tests: Recent Labs  Lab 04/22/19 1006 04/23/19 0426 04/24/19 0502 04/25/19 0621 04/26/19 0640  ALBUMIN 2.6* 2.4* 2.5* 2.4* 2.4*   No results for input(s): LIPASE, AMYLASE in the last 168 hours. No results for input(s): AMMONIA in the last 168 hours. Coagulation Profile: No results for input(s): INR, PROTIME in the last 168 hours. Cardiac Enzymes: No results for input(s): CKTOTAL, CKMB, CKMBINDEX, TROPONINI in the last 168 hours. BNP (last 3 results) No results for input(s): PROBNP in the last 8760 hours. HbA1C: Recent  Labs    04/25/19 0500  HGBA1C 7.8*   CBG: Recent Labs  Lab 04/25/19 1146 04/25/19 1638 04/25/19 2247 04/26/19 0816 04/26/19 1303  GLUCAP 137* 163* 174* 182* 189*   Lipid Profile: No results for input(s): CHOL, HDL, LDLCALC, TRIG, CHOLHDL, LDLDIRECT in the last 72 hours. Thyroid Function Tests: No results for input(s): TSH, T4TOTAL, FREET4, T3FREE, THYROIDAB in the last 72 hours. Anemia Panel: Recent Labs    04/25/19 0622 04/26/19 0640  FERRITIN 1,970* 1,860*   Sepsis Labs: No results for input(s): PROCALCITON, LATICACIDVEN in the last 168 hours.  Recent Results (from the past 240 hour(s))  Culture, blood (routine x 2)     Status: None   Collection Time: 04/17/19  8:50 AM  Result  Value Ref Range Status   Specimen Description BLOOD LEFT HAND  Final   Special Requests   Final    BOTTLES DRAWN AEROBIC ONLY Blood Culture adequate volume   Culture   Final    NO GROWTH 5 DAYS Performed at [facility], [address], [place], [place] [address]    Report Status 04/22/2019 FINAL  Final  Culture, blood (routine x 2)     Status: None   Collection Time: 04/17/19  8:55 AM  Result Value Ref Range Status   Specimen Description BLOOD LEFT HAND  Final   Special Requests   Final    BOTTLES DRAWN AEROBIC ONLY Blood Culture adequate volume   Culture   Final    NO GROWTH 5 DAYS Performed at [facility], [place] [address], [place], [place] [address]    Report Status 04/22/2019 FINAL  Final  SARS Coronavirus 2 (CEPHEID- Performed in [place] hospital lab), Hosp Order     Status: Abnormal   Collection Time: 04/24/19  5:20 PM  Result Value Ref Range Status   SARS Coronavirus 2 POSITIVE (A) NEGATIVE Final    Comment: RESULT CALLED TO, READ BACK BY AND VERIFIED WITH: B RODDY RN 04/24/19 1855 JDW (NOTE) If result is NEGATIVE SARS-CoV-2 target nucleic acids are NOT DETECTED. The SARS-CoV-2 RNA is generally detectable in upper and lower  respiratory specimens during the acute phase of infection. The lowest  concentration of SARS-CoV-2 viral copies this assay can detect is 250  copies / mL. A negative result does not preclude SARS-CoV-2 infection  and should not be used as the sole basis for treatment or other  patient management decisions.  A negative result may occur with  improper specimen collection / handling, submission of specimen other  than nasopharyngeal swab, presence of viral mutation(s) within the  areas targeted by this assay, and inadequate number of viral copies  (<250 copies / mL). A negative result must be combined with clinical  observations, patient history, and epidemiological information. If result is POSITIVE SARS-CoV-2 target nucleic acids are  DETECTED. The SARS- CoV-2 RNA is generally detectable in upper and lower  respiratory specimens during the acute phase of infection.  Positive  results are indicative of active infection with SARS-CoV-2.  Clinical  correlation with patient history and other diagnostic information is  necessary to determine patient infection status.  Positive results do  not rule out bacterial infection or co-infection with other viruses. If result is PRESUMPTIVE POSTIVE SARS-CoV-2 nucleic acids MAY BE PRESENT.   A presumptive positive result was obtained on the submitted specimen  and confirmed on repeat testing.  While 2019 novel coronavirus  (SARS-CoV-2) nucleic acids may be present in the submitted sample  additional confirmatory testing may be necessary for epidemiological  and / or clinical management purposes  to differentiate between  SARS-CoV-2 and other Sarbecovirus currently known to infect humans.  If clinically indicated additional testing with an alternate test  methodology [phone]) is advised . The SARS-CoV-2 RNA is generally  detectable in upper and lower respiratory specimens during the acute  phase of infection. The expected result is Negative. Fact Sheet for Patients:  StrictlyIdeas.no Fact Sheet for Healthcare Providers: BankingDealers.co.za This test is not yet approved or cleared by the [place] FDA and has been authorized for detection and/or diagnosis of SARS-CoV-2 by FDA under an Emergency Use Authorization (EUA).  This EUA will remain in effect (meaning this test can be used) for the duration of the COVID-19 declaration under Section 564(b)(1) of the Act, 21 U.S.C. section 360bbb-3(b)(1), unless the authorization is terminated or revoked sooner. Performed at [facility], [place] [address], [place], [place] [address]          Radiology Studies: No results found.      Scheduled Meds: . atorvastatin  40 mg  Oral QHS  . benzonatate  100 mg Oral TID  . carvedilol  6.25 mg Oral BID WC  . Chlorhexidine Gluconate Cloth  6 each Topical Q0600  . clopidogrel  75 mg Oral Daily  . dorzolamide-timolol  1 drop Both Eyes BID  . famotidine  20 mg Oral Daily  . heparin  7,500 Units Subcutaneous Q8H  . insulin aspart  0-15 Units Subcutaneous TID WC  . insulin aspart  8 Units Subcutaneous TID WC  . insulin detemir  15 Units Subcutaneous QHS  . Ipratropium-Albuterol  1 puff Inhalation TID  . pantoprazole  40 mg Oral BID  . sodium chloride flush  3 mL Intravenous Q12H  . vitamin C  500 mg Oral Daily  . zinc sulfate  220 mg Oral Daily   Continuous Infusions: . sodium chloride    . sodium chloride       LOS: 14 days    Time spent: 25 minutes spent in the coordination of care today.   Florencia Reasons  MD PhD  If 7PM-7AM, please contact night-coverage [email or web address] Password [facility] 04/26/2019, 5:37 PM

## 2019-04-26 NOTE — Plan of Care (Signed)
  Problem: Nutrition: Goal: Adequate nutrition will be maintained Outcome: Progressing   Problem: Coping: Goal: Level of anxiety will decrease Outcome: Progressing   Problem: Pain Managment: Goal: General experience of comfort will improve Outcome: Progressing   

## 2019-04-26 NOTE — Progress Notes (Signed)
Physical Therapy Treatment Patient Details Name: Cory Miller MRN: [id number] DOB: [birth year] Today's Date: 04/26/2019    History of Present Illness Cory Miller is a 78 y.o. 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  Reports having fevers up to 101 F at home. found to be Covid 19 +    PT Comments    Patient doing well with therapy today, ambulating in room on 4L, VSS. SPC and supervision with mild balance and weakness deficits noted. Cont to rec SNF pending further progression, however possible that he may progress to HHPT if support at home can be confirmed. Will need SNF if not.      Follow Up Recommendations  SNF;Other (comment)(HHPT with more progression )     Equipment Recommendations  (TBD)    Recommendations for Other Services OT consult     Precautions / Restrictions Precautions Precautions: Fall Precaution Comments: Fall risk low (especially with RW), but present Restrictions Weight Bearing Restrictions: No    Mobility  Bed Mobility Overal bed mobility: Needs Assistance Bed Mobility: Supine to Sit     Supine to sit: Min guard     General bed mobility comments: Minguard assist for safety and lines  Transfers Overall transfer level: Needs assistance Equipment used: Straight cane Transfers: Sit to/from Stand Sit to Stand: Supervision            Ambulation/Gait Ambulation/Gait assistance: Supervision Gait Distance (Feet): 50 Feet Assistive device: Straight cane Gait Pattern/deviations: Step-through pattern Gait velocity: decreasds   General Gait Details: pt walking in room, requets to use BR for BM, session limited.    Stairs             Wheelchair Mobility    Modified Rankin (Stroke Patients Only)        Balance Overall balance assessment: Needs assistance Sitting-balance support: No upper extremity supported;Feet supported Sitting balance-Leahy Scale: Good     Standing balance support: Bilateral upper extremity supported;During functional activity Standing balance-Leahy Scale: Fair Standing balance comment: Relies on UE support on [facility]                            Cognition Arousal/Alertness: Awake/alert Behavior During Therapy: WFL for tasks assessed/performed Overall Cognitive Status: Difficult to assess                                 General Comments: Following commands and retaining education.       Exercises      General Comments        Pertinent Vitals/Pain Pain Assessment: No/denies pain    Home Living                      Prior Function            PT Goals (current goals can now be found in the care plan section) Acute Rehab PT Goals Patient Stated Goal: I want to go home PT Goal Formulation: With patient Time For Goal Achievement: 04/30/19 Potential to Achieve Goals: Good Progress towards PT goals: Progressing toward goals    Frequency    Min 3X/week      PT Plan Current plan remains appropriate  Co-evaluation              AM-PAC PT "6 Clicks" Mobility   Outcome Measure  Help needed turning from your back to your side while in a flat bed without using bedrails?: None Help needed moving from lying on your back to sitting on the side of a flat bed without using bedrails?: A Little Help needed moving to and from a bed to a chair (including a wheelchair)?: A Little Help needed standing up from a chair using your arms (e.g., wheelchair or bedside chair)?: A Little Help needed to walk in hospital room?: A Little Help needed climbing 3-5 steps with a railing? : A Lot 6 Click Score: 18    End of Session Equipment Utilized During Treatment: Gait belt;Oxygen Activity Tolerance: Patient tolerated treatment  well Patient left: in chair;with bed alarm set;with chair alarm set Nurse Communication: Mobility status;Other (comment) PT Visit Diagnosis: Difficulty in walking, not elsewhere classified (R26.2);Other (comment)     Time: 1700-1720 PT Time Calculation (min) (ACUTE ONLY): 20 min  Charges:  $Gait Training: 8-22 mins                    Reinaldo Berber, PT, DPT Acute Rehabilitation Services Pager: [phone] Office: [place] 04/26/2019, 5:43 PM

## 2019-04-26 NOTE — Progress Notes (Signed)
Patient ID: Cory Miller, male   DOB: 10-31-1941, 78 y.o.   MRN: [id number] S: reported diarrhea and nausea yesterday. O:BP (!) 142/62 (BP Location: Left Arm)   Pulse 91   Temp 98 F (36.7 C) (Oral)   Resp (!) 33   Ht 6\' 1"  (1.854 m)   Wt 109 kg   SpO2 100%   BMI 31.70 kg/m   Intake/Output Summary (Last 24 hours) at 04/26/2019 1103 Last data filed at 04/25/2019 2200 Gross per 24 hour  Intake 720 ml  Output 585 ml  Net 135 ml   Intake/Output: I/O last 3 completed shifts: In: 900 [P.O.:900] Out: 585 [Other:585]  Intake/Output this shift:  No intake/output data recorded. Weight change:  Physical exam not performed due to concern for covid-19 infection and decisions based upon exam by primary svc.  Recent Labs  Lab 04/20/19 0516 04/21/19 0458 04/22/19 1006 04/23/19 0426 04/24/19 0502 04/25/19 0621 04/26/19 0640  NA 136 133* 135 134* 135 136 136  K 4.1 4.6 4.0 4.7 3.9 3.8 4.5  CL 95* 92* 92* 91* 95* 94* 95*  CO2 26 25 25 24 25 23  19*  GLUCOSE 181* 434* 264* 347* 254* 202* 152*  BUN 41* 65* 50* 77* 57* 84* 54*  CREATININE 9.01* 11.43* 8.68* 10.28* 7.56* 9.69* 7.59*  ALBUMIN 2.3* 2.2* 2.6* 2.4* 2.5* 2.4* 2.4*  CALCIUM 7.9* 8.1* 8.6* 8.4* 8.4* 8.2* 8.2*  PHOS 3.9 4.5 3.8 4.8* 5.5* 5.8* 5.7*   Liver Function Tests: Recent Labs  Lab 04/24/19 0502 04/25/19 0621 04/26/19 0640  ALBUMIN 2.5* 2.4* 2.4*   No results for input(s): LIPASE, AMYLASE in the last 168 hours. No results for input(s): AMMONIA in the last 168 hours. CBC: Recent Labs  Lab 04/21/19 0809 04/23/19 0719 04/25/19 0622  WBC 7.4 14.1* 13.8*  HGB 10.7* 10.7* 11.6*  HCT 32.5* 32.4* 35.2*  MCV 98.2 95.9 94.6  PLT 325 417* 488*   Cardiac Enzymes: No results for input(s): CKTOTAL, CKMB, CKMBINDEX, TROPONINI in the last 168 hours. CBG: Recent Labs  Lab 04/25/19 0820 04/25/19 1146 04/25/19 1638 04/25/19 2247 04/26/19 0816  GLUCAP 161* 137* 163* 174* 182*    Iron Studies:  Recent Labs   04/26/19 0640  FERRITIN 1,860*   Studies/Results: No results found. [name] atorvastatin  40 mg Oral QHS  . benzonatate  100 mg Oral TID  . carvedilol  6.25 mg Oral BID WC  . Chlorhexidine Gluconate Cloth  6 each Topical Q0600  . clopidogrel  75 mg Oral Daily  . dorzolamide-timolol  1 drop Both Eyes BID  . famotidine  20 mg Oral Daily  . heparin  7,500 Units Subcutaneous Q8H  . insulin aspart  0-15 Units Subcutaneous TID WC  . insulin aspart  8 Units Subcutaneous TID WC  . insulin detemir  15 Units Subcutaneous QHS  . Ipratropium-Albuterol  1 puff Inhalation TID  . pantoprazole  40 mg Oral BID  . sodium chloride flush  3 mL Intravenous Q12H  . vitamin C  500 mg Oral Daily  . zinc sulfate  220 mg Oral Daily    BMET    Component Value Date/Time   NA 136 04/26/2019 0640   K 4.5 04/26/2019 0640   CL 95 (L) 04/26/2019 0640   CO2 19 (L) 04/26/2019 0640   GLUCOSE 152 (H) 04/26/2019 0640   BUN 54 (H) 04/26/2019 0640   CREATININE 7.59 (H) 04/26/2019 0640   CALCIUM 8.2 (L) 04/26/2019 0640   GFRNONAA 6 (L) 04/26/2019  0640   GFRAA 7 (L) 04/26/2019 0640   CBC    Component Value Date/Time   WBC 13.8 (H) 04/25/2019 0622   RBC 3.72 (L) 04/25/2019 0622   HGB 11.6 (L) 04/25/2019 0622   HGB 11.9 (L) 04/12/2019 1513   HCT 35.2 (L) 04/25/2019 0622   PLT 488 (H) 04/25/2019 0622   MCV 94.6 04/25/2019 0622   MCH 31.2 04/25/2019 0622   MCHC 33.0 04/25/2019 0622   RDW 14.4 04/25/2019 0622   LYMPHSABS 1.0 04/17/2019 0409   MONOABS 0.5 04/17/2019 0409   EOSABS 0.0 04/17/2019 0409   BASOSABS 0.0 04/17/2019 0409     [facility] [place] [phone]  MWF, BFR400 DFR600 AVF 4 hours EDW 116kg(new EDW closer to 112.5kg)2K/3Ca  Assessment/Plan: 1. covid-19 pneumonia- now with increased oxygen requirements of 3-5 liters O2 via [place].  Repeat swab remains positive for Covid-19.    2. ESRD- cont with HD qMWF.  He is 7 kg below edw and BP's stable. Continue to challenge edw to help with  hypoxia. 3. 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 SHPTH- stable 6. Second degree heart block- per primary Donetta Potts, MD [facility] [phone]

## 2019-04-26 NOTE — Progress Notes (Signed)
Ordered EKG completed and posted. MD paged.   Pt in no distress at this time.  HR 97, BP 131/72

## 2019-04-27 DIAGNOSIS — J9601 Acute respiratory failure with hypoxia: Secondary | ICD-10-CM

## 2019-04-27 LAB — GLUCOSE, CAPILLARY
Glucose-Capillary: 150 mg/dL — ABNORMAL HIGH (ref 70–99)
Glucose-Capillary: 222 mg/dL — ABNORMAL HIGH (ref 70–99)
Glucose-Capillary: 230 mg/dL — ABNORMAL HIGH (ref 70–99)
Glucose-Capillary: 161 mg/dL — ABNORMAL HIGH (ref 70–99)

## 2019-04-27 MED ORDER — HYDRALAZINE HCL 20 MG/ML IJ SOLN
10.0000 mg | Freq: Four times a day (QID) | INTRAMUSCULAR | Status: DC | PRN
  Administered 2019-05-06: 10 mg via INTRAVENOUS
  Filled 2019-04-27 (×2): qty 1

## 2019-04-27 MED ORDER — IPRATROPIUM-ALBUTEROL 20-100 MCG/ACT IN AERS
1.0000 | INHALATION_SPRAY | Freq: Three times a day (TID) | RESPIRATORY_TRACT | Status: DC
  Administered 2019-04-27 – 2019-05-06 (×23): 1 via RESPIRATORY_TRACT

## 2019-04-27 NOTE — Progress Notes (Signed)
Patient ID: Cory Miller, male   DOB: 25-Dec-1940, 78 y.o.   MRN: [id number] S: Pt getting out of bed without assistance and RN staff placed alarm on bed O:BP (!) 136/59   Pulse 96   Temp (!) 97.5 F (36.4 C) (Oral)   Resp (!) 27   Ht 6\' 1"  (1.854 m)   Wt 109.3 kg   SpO2 100%   BMI 31.79 kg/m   Intake/Output Summary (Last 24 hours) at 04/27/2019 1206 Last data filed at 04/26/2019 1700 Gross per 24 hour  Intake 600 ml  Output -  Net 600 ml   Intake/Output: I/O last 3 completed shifts: In: 1200 [P.O.:1200] Out: -   Intake/Output this shift:  No intake/output data recorded. Weight change: -0.5 kg Direct physical contact not performed out of concern for covid-19 infection and utilizing exam documented by primary team and RN documentation.   Recent Labs  Lab 04/21/19 0458 04/22/19 1006 04/23/19 0426 04/24/19 0502 04/25/19 0621 04/26/19 0640  NA 133* 135 134* 135 136 136  K 4.6 4.0 4.7 3.9 3.8 4.5  CL 92* 92* 91* 95* 94* 95*  CO2 25 25 24 25 23  19*  GLUCOSE 434* 264* 347* 254* 202* 152*  BUN 65* 50* 77* 57* 84* 54*  CREATININE 11.43* 8.68* 10.28* 7.56* 9.69* 7.59*  ALBUMIN 2.2* 2.6* 2.4* 2.5* 2.4* 2.4*  CALCIUM 8.1* 8.6* 8.4* 8.4* 8.2* 8.2*  PHOS 4.5 3.8 4.8* 5.5* 5.8* 5.7*   Liver Function Tests: Recent Labs  Lab 04/24/19 0502 04/25/19 0621 04/26/19 0640  ALBUMIN 2.5* 2.4* 2.4*   No results for input(s): LIPASE, AMYLASE in the last 168 hours. No results for input(s): AMMONIA in the last 168 hours. CBC: Recent Labs  Lab 04/21/19 0809 04/23/19 0719 04/25/19 0622  WBC 7.4 14.1* 13.8*  HGB 10.7* 10.7* 11.6*  HCT 32.5* 32.4* 35.2*  MCV 98.2 95.9 94.6  PLT 325 417* 488*   Cardiac Enzymes: No results for input(s): CKTOTAL, CKMB, CKMBINDEX, TROPONINI in the last 168 hours. CBG: Recent Labs  Lab 04/26/19 0816 04/26/19 1303 04/26/19 1647 04/26/19 2217 04/27/19 0806  GLUCAP 182* 189* 238* 142* 150*    Iron Studies:  Recent Labs    04/26/19 0640   FERRITIN 1,860*   Studies/Results: No results found. [name] atorvastatin  40 mg Oral QHS  . benzonatate  100 mg Oral TID  . carvedilol  6.25 mg Oral BID WC  . Chlorhexidine Gluconate Cloth  6 each Topical Q0600  . clopidogrel  75 mg Oral Daily  . dorzolamide-timolol  1 drop Both Eyes BID  . famotidine  20 mg Oral Daily  . heparin  7,500 Units Subcutaneous Q8H  . insulin aspart  0-15 Units Subcutaneous TID WC  . insulin aspart  8 Units Subcutaneous TID WC  . insulin detemir  15 Units Subcutaneous QHS  . Ipratropium-Albuterol  1 puff Inhalation TID  . pantoprazole  40 mg Oral BID  . sodium chloride flush  3 mL Intravenous Q12H  . vitamin C  500 mg Oral Daily  . zinc sulfate  220 mg Oral Daily    BMET    Component Value Date/Time   NA 136 04/26/2019 0640   K 4.5 04/26/2019 0640   CL 95 (L) 04/26/2019 0640   CO2 19 (L) 04/26/2019 0640   GLUCOSE 152 (H) 04/26/2019 0640   BUN 54 (H) 04/26/2019 0640   CREATININE 7.59 (H) 04/26/2019 0640   CALCIUM 8.2 (L) 04/26/2019 0640   GFRNONAA 6 (L) 04/26/2019  0640   GFRAA 7 (L) 04/26/2019 0640   CBC    Component Value Date/Time   WBC 13.8 (H) 04/25/2019 0622   RBC 3.72 (L) 04/25/2019 0622   HGB 11.6 (L) 04/25/2019 0622   HGB 11.9 (L) 04/12/2019 1513   HCT 35.2 (L) 04/25/2019 0622   PLT 488 (H) 04/25/2019 0622   MCV 94.6 04/25/2019 0622   MCH 31.2 04/25/2019 0622   MCHC 33.0 04/25/2019 0622   RDW 14.4 04/25/2019 0622   LYMPHSABS 1.0 04/17/2019 0409   MONOABS 0.5 04/17/2019 0409   EOSABS 0.0 04/17/2019 0409   BASOSABS 0.0 04/17/2019 0409     [facility] [place] [phone]  MWF, BFR400 DFR600 AVF 4 hours EDW 116kg(new EDW closer to 112.5kg)2K/3Ca  Assessment/Plan: 1. covid-19 pneumonia- now with increased oxygen requirements of 3-5 liters O2 via [place].Repeat swab remains positive for Covid-19.   2. ESRD- cont with HD qMWF. He is 7 kg below edw and BP's stable. Continue to challenge edw to help with  hypoxia. 3. 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 SHPTH- stable 6. Second degree heart block- per primary  Donetta Potts, MD [facility] [phone]

## 2019-04-27 NOTE — Progress Notes (Signed)
Pt impulsive and getting out of bed without calling.  RN educated pt on importance of calling & safety. Alarm set.

## 2019-04-27 NOTE — Progress Notes (Signed)
PIV left hand leaking.  IV team in to place new IV.    Old IV discontinued.

## 2019-04-27 NOTE — Progress Notes (Signed)
Pt now sleeping.  Pulled PRN hydralazine to administer, but recheck of BP was below ordered parameter.  Will hold for now and monitor, report to oncoming RN.  Pt appears comfortable.  Denies pain.

## 2019-04-27 NOTE — Progress Notes (Addendum)
Cory Miller  PROGRESS NOTE    Cory Miller  DEY:[id number] DOB: 1941-10-03 DOA: 04/12/2019 PCP: [name], MD   Brief Narrative:   Cory Miller a 78 y.o.malewith medical history significant ofHTN, HLD, CADs/pCABG, ESRD on HD(M/W/F), DM type II, CVA, GERD, and hard of hearing; who [facility] with complaints of fever, chills, cough, and shortness of breath approximately 2 weeks. Reports having fevers up to 101 F at home. Patient is not normally on oxygen, but found himself getting easily winded even with minimal exertion. Associated symptoms included sore throat, nasal congestion, diarrhea earlier in the week, and intermittent chest pain. He had continued to go to hemodialysis sessions and had a near complete dialysis session on 5/15. However, was sent to the emergency department for further evaluation due to symptoms. On admission  (To [facility] )patient was noted to blood pressures up to 191/71, pulse 107, respirations24, O2 saturations 94% on 2 L nasal cannula oxygen. Lab work revealed WBC 5.9, potassium 3.6, CO2 29, BUN 20, creatinine 3.09, and. Lactic acid 1.4. Chest x-ray showed chronic cardiomegaly with basilar congestion without any focal airspace opacity noted. COVID-19 testing was positive. Transfer was requested for need of hemodialysis and COVID-19 testing was positive on 5/13 at [place] . Patient was accepted to a progressive bed here at [facility] for need of hemodialysis.  Assessment & Plan: Acute COVID-19 Viral illness,   Tmax:  98 Oxygen requirements: today on 5/31, he started to show improvement, with decreased oxygen requirement ,appear stronger and more interative  Antibiotics:none Diuretics: none, on HD Vitamin C and Zinc: Ordered DVT Prophylaxis: Subcutaneous Heparin  Dose increased due to worsening of inflammatory markers, may be able to back down if he continue to improve Remdesivir: Contraindicated in  ESRD patient Steroids: Starting at 40 mg IV Solu-Medrol dose, finished total of 5 days treatment, patient appear improving, now blood glucose elevated, steroids d/ced on 5/28  Actemra: Currently holding, may require if the condition worsens Prone positioning: Patient encouraged to stay in prone position as much as possible.  During this encounter: Patient Isolation: Droplet + Contact HCP PPE: CAPR, gown. gloves Patient PPE: None    ESRD on HD Renal diet and carb modified with fluid restriction HD per nephrology Need to figure out if patient dialysis company has special arrangement for covid + patient.  Possible UTI/asymptomatic bacteriuria UA urine positive for large leukocytes with >50 WBCs. UCx: Negative.  Completed antibiotics.  Second-degree heart block ? Per previous documentation, repeat ekg on 5/30 with sinus rhythm, first degree av block Continue to monitor, does not appear symptomatic Continue on tele, Consult cardiology if needed  Essential hypertension Blood pressure seen to be elevated up to 191/71 initially on presentation Have been on Norvasc, Coreg, clonidine, torsemide and hydralazine. Currently on coreg only  Diabetes mellitus type 2 uncontrolled with hyperglycemia with renal complication Last hemoglobin A1c noted to be 6.9 in 09/2018. Hypoglycemic protocol Continue home Levemir 10 units at bedtime CBGs with moderate sliding scale insulin Blood glucose elevated , partly due to steroids, d/c steroids, add on meal coverage  Anemia of chronic disease Hemoglobin appears stable Continue to monitor   Deconditioning PT/OT consults recommends SNF.  Indigestion. Continue Pepcid and add Maalox.  My colleague Dr Posey Pronto has goals of care discussion with the patient and sister, confirmed full code  DVT prophylaxis: heparin Code Status: FULL Disposition Plan: needs snf placement with dialysis capacity , however, repeat covid testing on 5/28 remains  positive  Consultants:   Nephrology  Palliative care    Antimicrobials:  . Rocephin    Subjective: He is hard of hearing,  communication was made with the use of written notes.   He states he is  feeling better today with decreased oxygen requirement for the first day since hospitalization, anticipate continued improvement  Objective: Vitals:   04/27/19 1000 04/27/19 1218 04/27/19 1500 04/27/19 1751  BP:  (!) 146/65  (!) 147/67  Pulse:  (!) 109 92 (!) 102  Resp: (!) 27 (!) 23 (!) 24 (!) 30  Temp:  97.8 F (36.6 C)  98.2 F (36.8 C)  TempSrc:  Oral  Oral  SpO2: 100% 97% 99% 96%  Weight:      Height:       No intake or output data in the 24 hours ending 04/27/19 1854 Filed Weights   04/25/19 1134 04/26/19 0500 04/27/19 0300  Weight: 109.4 kg 109 kg 109.3 kg    Examination:  General: 78 y.o. male resting in bed in NAD, smiling , appear stronger  Cardiovascular: RRR, +S1, S2, no m/g/r, equal pulses throughout, RUE fistula Respiratory: decreased at bases, tachypnic, no accessory muscle use GI: BS+, NDNT, no masses noted, no organomegaly noted MSK: No e/c/c Skin: No rashes, bruises, ulcerations noted Neuro: A&O x 3, no focal deficits, very hard of hearing Psyc: Appropriate interaction and affect, calm/cooperative    Data Reviewed: I have personally reviewed following labs and imaging studies.  CBC: Recent Labs  Lab 04/21/19 0809 04/23/19 0719 04/25/19 0622  WBC 7.4 14.1* 13.8*  HGB 10.7* 10.7* 11.6*  HCT 32.5* 32.4* 35.2*  MCV 98.2 95.9 94.6  PLT 325 417* 833*   Basic Metabolic Panel: Recent Labs  Lab 04/22/19 1006 04/23/19 0426 04/24/19 0502 04/25/19 0621 04/26/19 0640  NA 135 134* 135 136 136  K 4.0 4.7 3.9 3.8 4.5  CL 92* 91* 95* 94* 95*  CO2 25 24 25 23  19*  GLUCOSE 264* 347* 254* 202* 152*  BUN 50* 77* 57* 84* 54*  CREATININE 8.68* 10.28* 7.56* 9.69* 7.59*  CALCIUM 8.6* 8.4* 8.4* 8.2* 8.2*  PHOS 3.8 4.8* 5.5* 5.8* 5.7*   GFR:  Estimated Creatinine Clearance: 10.6 mL/min (A) (by C-G formula based on SCr of 7.59 mg/dL (H)). Liver Function Tests: Recent Labs  Lab 04/22/19 1006 04/23/19 0426 04/24/19 0502 04/25/19 0621 04/26/19 0640  ALBUMIN 2.6* 2.4* 2.5* 2.4* 2.4*   No results for input(s): LIPASE, AMYLASE in the last 168 hours. No results for input(s): AMMONIA in the last 168 hours. Coagulation Profile: No results for input(s): INR, PROTIME in the last 168 hours. Cardiac Enzymes: No results for input(s): CKTOTAL, CKMB, CKMBINDEX, TROPONINI in the last 168 hours. BNP (last 3 results) No results for input(s): PROBNP in the last 8760 hours. HbA1C: Recent Labs    04/25/19 0500  HGBA1C 7.8*   CBG: Recent Labs  Lab 04/26/19 1647 04/26/19 2217 04/27/19 0806 04/27/19 1215 04/27/19 1746  GLUCAP 238* 142* 150* 222* 230*   Lipid Profile: No results for input(s): CHOL, HDL, LDLCALC, TRIG, CHOLHDL, LDLDIRECT in the last 72 hours. Thyroid Function Tests: No results for input(s): TSH, T4TOTAL, FREET4, T3FREE, THYROIDAB in the last 72 hours. Anemia Panel: Recent Labs    04/25/19 0622 04/26/19 0640  FERRITIN 1,970* 1,860*   Sepsis Labs: No results for input(s): PROCALCITON, LATICACIDVEN in the last 168 hours.  Recent Results (from the past 240 hour(s))  SARS Coronavirus 2 (CEPHEID- Performed in [place] lab), [facility]  Order     Status: Abnormal   Collection Time: 04/24/19  5:20 PM  Result Value Ref Range Status   SARS Coronavirus 2 POSITIVE (A) NEGATIVE Final    Comment: RESULT CALLED TO, READ BACK BY AND VERIFIED WITH: B RODDY RN 04/24/19 1855 JDW (NOTE) If result is NEGATIVE SARS-CoV-2 target nucleic acids are NOT DETECTED. The SARS-CoV-2 RNA is generally detectable in upper and lower  respiratory specimens during the acute phase of infection. The lowest  concentration of SARS-CoV-2 viral copies this assay can detect is 250  copies / mL. A negative result does not preclude SARS-CoV-2  infection  and should not be used as the sole basis for treatment or other  patient management decisions.  A negative result may occur with  improper specimen collection / handling, submission of specimen other  than nasopharyngeal swab, presence of viral mutation(s) within the  areas targeted by this assay, and inadequate number of viral copies  (<250 copies / mL). A negative result must be combined with clinical  observations, patient history, and epidemiological information. If result is POSITIVE SARS-CoV-2 target nucleic acids are DETECTED. The SARS- CoV-2 RNA is generally detectable in upper and lower  respiratory specimens during the acute phase of infection.  Positive  results are indicative of active infection with SARS-CoV-2.  Clinical  correlation with patient history and other diagnostic information is  necessary to determine patient infection status.  Positive results do  not rule out bacterial infection or co-infection with other viruses. If result is PRESUMPTIVE POSTIVE SARS-CoV-2 nucleic acids MAY BE PRESENT.   A presumptive positive result was obtained on the submitted specimen  and confirmed on repeat testing.  While 2019 novel coronavirus  (SARS-CoV-2) nucleic acids may be present in the submitted sample  additional confirmatory testing may be necessary for epidemiological  and / or clinical management purposes  to differentiate between  SARS-CoV-2 and other Sarbecovirus currently known to infect humans.  If clinically indicated additional testing with an alternate test  methodology [phone]) is advised . The SARS-CoV-2 RNA is generally  detectable in upper and lower respiratory specimens during the acute  phase of infection. The expected result is Negative. Fact Sheet for Patients:  StrictlyIdeas.no Fact Sheet for Healthcare Providers: BankingDealers.co.za This test is not yet approved or cleared by the [place]  FDA and has been authorized for detection and/or diagnosis of SARS-CoV-2 by FDA under an Emergency Use Authorization (EUA).  This EUA will remain in effect (meaning this test can be used) for the duration of the COVID-19 declaration under Section 564(b)(1) of the Act, 21 U.S.C. section 360bbb-3(b)(1), unless the authorization is terminated or revoked sooner. Performed at [facility], [address] [place], [place] [address]          Radiology Studies: No results found.      Scheduled Meds: . atorvastatin  40 mg Oral QHS  . benzonatate  100 mg Oral TID  . carvedilol  6.25 mg Oral BID WC  . Chlorhexidine Gluconate Cloth  6 each Topical Q0600  . clopidogrel  75 mg Oral Daily  . dorzolamide-timolol  1 drop Both Eyes BID  . famotidine  20 mg Oral Daily  . heparin  7,500 Units Subcutaneous Q8H  . insulin aspart  0-15 Units Subcutaneous TID WC  . insulin aspart  8 Units Subcutaneous TID WC  . insulin detemir  15 Units Subcutaneous QHS  . Ipratropium-Albuterol  1 puff Inhalation TID  . pantoprazole  40  mg Oral BID  . sodium chloride flush  3 mL Intravenous Q12H  . vitamin C  500 mg Oral Daily  . zinc sulfate  220 mg Oral Daily   Continuous Infusions: . sodium chloride    . sodium chloride       LOS: 15 days    Time spent: 35 minutes spent in the coordination of care today.   Florencia Reasons  MD PhD  If 7PM-7AM, please contact night-coverage [email or web address] Password [facility] 04/27/2019, 6:54 PM

## 2019-04-27 NOTE — Progress Notes (Signed)
Pt refusing lab draw this morning per lab team.  RN educated pt about safety and importance of labs, pt continuing to refuse labs to be drawn at this time.

## 2019-04-28 LAB — CBC
HCT: 32.3 % — ABNORMAL LOW (ref 39.0–52.0)
Hemoglobin: 10.6 g/dL — ABNORMAL LOW (ref 13.0–17.0)
MCH: 31.7 pg (ref 26.0–34.0)
MCHC: 32.8 g/dL (ref 30.0–36.0)
MCV: 96.7 fL (ref 80.0–100.0)
Platelets: 375 10*3/uL (ref 150–400)
RBC: 3.34 MIL/uL — ABNORMAL LOW (ref 4.22–5.81)
RDW: 14.5 % (ref 11.5–15.5)
WBC: 13.4 10*3/uL — ABNORMAL HIGH (ref 4.0–10.5)
nRBC: 0 % (ref 0.0–0.2)

## 2019-04-28 LAB — C-REACTIVE PROTEIN: CRP: 4.6 mg/dL — ABNORMAL HIGH (ref <1.0)

## 2019-04-28 LAB — GLUCOSE, CAPILLARY
Glucose-Capillary: 135 mg/dL — ABNORMAL HIGH (ref 70–99)
Glucose-Capillary: 163 mg/dL — ABNORMAL HIGH (ref 70–99)
Glucose-Capillary: 193 mg/dL — ABNORMAL HIGH (ref 70–99)
Glucose-Capillary: 78 mg/dL (ref 70–99)

## 2019-04-28 LAB — RENAL FUNCTION PANEL
Albumin: 2.4 g/dL — ABNORMAL LOW (ref 3.5–5.0)
Anion gap: 17 — ABNORMAL HIGH (ref 5–15)
BUN: 101 mg/dL — ABNORMAL HIGH (ref 8–23)
CO2: 23 mmol/L (ref 22–32)
Calcium: 8.2 mg/dL — ABNORMAL LOW (ref 8.9–10.3)
Chloride: 96 mmol/L — ABNORMAL LOW (ref 98–111)
Creatinine, Ser: 11.81 mg/dL — ABNORMAL HIGH (ref 0.61–1.24)
GFR calc Af Amer: 4 mL/min — ABNORMAL LOW (ref >60)
GFR calc non Af Amer: 4 mL/min — ABNORMAL LOW (ref >60)
Glucose, Bld: 163 mg/dL — ABNORMAL HIGH (ref 70–99)
Phosphorus: 6.1 mg/dL — ABNORMAL HIGH (ref 2.5–4.6)
Potassium: 4.3 mmol/L (ref 3.5–5.1)
Sodium: 136 mmol/L (ref 135–145)

## 2019-04-28 LAB — D-DIMER, QUANTITATIVE: D-Dimer, Quant: 0.74 ug/mL-FEU — ABNORMAL HIGH (ref 0.00–0.50)

## 2019-04-28 LAB — FERRITIN: Ferritin: 1540 ng/mL — ABNORMAL HIGH (ref 24–336)

## 2019-04-28 MED ORDER — CALCIUM ACETATE (PHOS BINDER) 667 MG PO CAPS
667.0000 mg | ORAL_CAPSULE | Freq: Three times a day (TID) | ORAL | Status: DC
  Administered 2019-04-28 – 2019-05-12 (×39): 667 mg via ORAL
  Filled 2019-04-28 (×39): qty 1

## 2019-04-28 MED ORDER — HEPARIN SODIUM (PORCINE) 1000 UNIT/ML DIALYSIS: 20.0000 [IU]/kg | INTRAMUSCULAR | Status: DC | PRN

## 2019-04-28 MED ORDER — HEPARIN SODIUM (PORCINE) 1000 UNIT/ML IJ SOLN
INTRAMUSCULAR | Status: AC
  Administered 2019-04-28: 2400 [IU] via INTRAVENOUS_CENTRAL
  Filled 2019-04-28: qty 3

## 2019-04-28 NOTE — Progress Notes (Addendum)
Cory Miller  PROGRESS NOTE    Cory Miller  YNW:[id number] DOB: December 28, 1940 DOA: 04/12/2019 PCP: [name], MD   Brief Narrative:   [name] a 78 y.o.malewith medical history significant ofHTN, HLD, CADs/pCABG, ESRD 23on HD(M/W/F), DM type II, CVA, GERD, and hard of hearing; who presented [facility] with complaints of fever, chills, cough, and shortness of breath approximately 2 weeks. Reports having fevers up to 101 F at home.  Presented to [facility] ER, chest x-ray noted basilar congestion without any focal airspace opacity. COVID-19 testing was positive [place] on 5/15, transferred to [facility], due to need for hemodialysis. -picked up care from my partner today 6/1  Assessment & Plan:  Acute COVID-19 Viral illness,  -Clinically improving with supportive care -Hypoxia improving slowly, initially required 4 L O2 via nasal cannula now down to 1.5 L -Treated with IV steroids x5 days earlier this hospital stay which was completed on 5/28 -Remdesivir contraindicated in ESRD -Actemra deferred for now, if clinically worsens this would be a consideration -Inflammatory markers improving CRP down from 20 a week ago to 4.6 today -Ferritin down from 2600 to 1500 -Continue to wean O2 -Patient encouraged to consider prone position as tolerated -Increase activity, ambulation -Swab from 5/28 was positive, will repeat  ESRD on HD -Continue renal diet, HD today -Nephrology following  Possible UTI/asymptomatic bacteriuria UA urine positive for large leukocytes with >50 WBCs. UCx: Negative.  Completed antibiotics.  Second-degree heart block ? Per previous documentation, repeat ekg on 5/30 with sinus rhythm, first degree av block Continue to monitor, does not appear symptomatic Continue Tele for now  Essential hypertension Blood pressure seen to be elevated up to 191/71 initially on presentation Have been on Norvasc, Coreg, clonidine, torsemide and  hydralazine. Currently on coreg only  Diabetes mellitus type 2 uncontrolled with hyperglycemia with renal complication -Hemoglobin A1c 7.8 -Stable, continue Levemir 15 units and NovoLog with meals  Anemia of chronic disease Hemoglobin appears stable Continue to monitor  History of CVA -Plavix and statin  Deconditioning PT/OT consults recommends SNF.  Indigestion. Continue Pepcid, Maalox.  DVT prophylaxis: heparin Code Status: FULL Disposition Plan: needs snf placement with dialysis capacity , however, repeat covid testing on 5/28 remains positive, per last PT note made progress to home health  Consultants:   Nephrology  Palliative care    Antimicrobials:   Rocephin    Subjective: -Breathing improving  -Currently getting dialyzed, down to 1.5 L of O2 -Communicated in part by writing  Objective: Vitals:   04/28/19 1030 04/28/19 1045 04/28/19 1100 04/28/19 1106  BP: 105/60 (!) 98/54 (!) 75/52   Pulse: 95 (!) 103 (!) 111   Resp:    20  Temp:    (!) 96.5 F (35.8 C)  TempSrc:    Axillary  SpO2: 94% 94% 91% 100%  Weight:    108.7 kg  Height:        Intake/Output Summary (Last 24 hours) at 04/28/2019 1333 Last data filed at 04/28/2019 1106 Gross per 24 hour  Intake 120 ml  Output 1557 ml  Net -1437 ml   Filed Weights   04/27/19 0300 04/28/19 0730 04/28/19 1106  Weight: 109.3 kg 110.3 kg 108.7 kg    Examination:  Gen: Awake, Alert, Oriented X 2, resting comfortably, very hard of hearing HEENT: Neck supple, unable to assess JVD Lungs: Clear bilaterally CVS: S1-S2/regular rate rhythm  abd: soft, Non tender, non distended, BS present Extremities: Right arm AV fistula Skin: no new rashes Psyc: Appropriate interaction  and affect, calm/cooperative,     Data Reviewed: I have personally reviewed following labs and imaging studies.  CBC: Recent Labs  Lab 04/23/19 0719 04/25/19 0622 04/28/19 0754  WBC 14.1* 13.8* 13.4*  HGB 10.7* 11.6* 10.6*  HCT  32.4* 35.2* 32.3*  MCV 95.9 94.6 96.7  PLT 417* 488* 701   Basic Metabolic Panel: Recent Labs  Lab 04/23/19 0426 04/24/19 0502 04/25/19 0621 04/26/19 0640 04/28/19 0500  NA 134* 135 136 136 136  K 4.7 3.9 3.8 4.5 4.3  CL 91* 95* 94* 95* 96*  CO2 24 25 23  19* 23  GLUCOSE 347* 254* 202* 152* 163*  BUN 77* 57* 84* 54* 101*  CREATININE 10.28* 7.56* 9.69* 7.59* 11.81*  CALCIUM 8.4* 8.4* 8.2* 8.2* 8.2*  PHOS 4.8* 5.5* 5.8* 5.7* 6.1*   GFR: Estimated Creatinine Clearance: 6.8 mL/min (A) (by C-G formula based on SCr of 11.81 mg/dL (H)). Liver Function Tests: Recent Labs  Lab 04/23/19 0426 04/24/19 0502 04/25/19 0621 04/26/19 0640 04/28/19 0500  ALBUMIN 2.4* 2.5* 2.4* 2.4* 2.4*   No results for input(s): LIPASE, AMYLASE in the last 168 hours. No results for input(s): AMMONIA in the last 168 hours. Coagulation Profile: No results for input(s): INR, PROTIME in the last 168 hours. Cardiac Enzymes: No results for input(s): CKTOTAL, CKMB, CKMBINDEX, TROPONINI in the last 168 hours. BNP (last 3 results) No results for input(s): PROBNP in the last 8760 hours. HbA1C: No results for input(s): HGBA1C in the last 72 hours. CBG: Recent Labs  Lab 04/27/19 1215 04/27/19 1746 04/27/19 2235 04/28/19 1049 04/28/19 1256  GLUCAP 222* 230* 161* 135* 163*   Lipid Profile: No results for input(s): CHOL, HDL, LDLCALC, TRIG, CHOLHDL, LDLDIRECT in the last 72 hours. Thyroid Function Tests: No results for input(s): TSH, T4TOTAL, FREET4, T3FREE, THYROIDAB in the last 72 hours. Anemia Panel: Recent Labs    04/26/19 0640 04/28/19 0500  FERRITIN 1,860* 1,540*   Sepsis Labs: No results for input(s): PROCALCITON, LATICACIDVEN in the last 168 hours.  Recent Results (from the past 240 hour(s))  SARS Coronavirus 2 (CEPHEID- Performed in [place] hospital lab), Hosp Order     Status: Abnormal   Collection Time: 04/24/19  5:20 PM  Result Value Ref Range Status   SARS Coronavirus 2  POSITIVE (A) NEGATIVE Final    Comment: RESULT CALLED TO, READ BACK BY AND VERIFIED WITH: B RODDY RN 04/24/19 1855 JDW (NOTE) If result is NEGATIVE SARS-CoV-2 target nucleic acids are NOT DETECTED. The SARS-CoV-2 RNA is generally detectable in upper and lower  respiratory specimens during the acute phase of infection. The lowest  concentration of SARS-CoV-2 viral copies this assay can detect is 250  copies / mL. A negative result does not preclude SARS-CoV-2 infection  and should not be used as the sole basis for treatment or other  patient management decisions.  A negative result may occur with  improper specimen collection / handling, submission of specimen other  than nasopharyngeal swab, presence of viral mutation(s) within the  areas targeted by this assay, and inadequate number of viral copies  (<250 copies / mL). A negative result must be combined with clinical  observations, patient history, and epidemiological information. If result is POSITIVE SARS-CoV-2 target nucleic acids are DETECTED. The SARS- CoV-2 RNA is generally detectable in upper and lower  respiratory specimens during the acute phase of infection.  Positive  results are indicative of active infection with SARS-CoV-2.  Clinical  correlation with patient history and other diagnostic  information is  necessary to determine patient infection status.  Positive results do  not rule out bacterial infection or co-infection with other viruses. If result is PRESUMPTIVE POSTIVE SARS-CoV-2 nucleic acids MAY BE PRESENT.   A presumptive positive result was obtained on the submitted specimen  and confirmed on repeat testing.  While 2019 novel coronavirus  (SARS-CoV-2) nucleic acids may be present in the submitted sample  additional confirmatory testing may be necessary for epidemiological  and / or clinical management purposes  to differentiate between  SARS-CoV-2 and other Sarbecovirus currently known to infect humans.  If  clinically indicated additional testing with an alternate test  methodology [phone]) is advised . The SARS-CoV-2 RNA is generally  detectable in upper and lower respiratory specimens during the acute  phase of infection. The expected result is Negative. Fact Sheet for Patients:  StrictlyIdeas.no Fact Sheet for Healthcare Providers: BankingDealers.co.za This test is not yet approved or cleared by the [place] FDA and has been authorized for detection and/or diagnosis of SARS-CoV-2 by FDA under an Emergency Use Authorization (EUA).  This EUA will remain in effect (meaning this test can be used) for the duration of the COVID-19 declaration under Section 564(b)(1) of the Act, 21 U.S.C. section 360bbb-3(b)(1), unless the authorization is terminated or revoked sooner. Performed at [facility], [place] [address], [place], [place] [address]          Radiology Studies: No results found.      Scheduled Meds:  atorvastatin  40 mg Oral QHS   benzonatate  100 mg Oral TID   calcium acetate  667 mg Oral TID WC   carvedilol  6.25 mg Oral BID WC   Chlorhexidine Gluconate Cloth  6 each Topical Q0600   clopidogrel  75 mg Oral Daily   dorzolamide-timolol  1 drop Both Eyes BID   famotidine  20 mg Oral Daily   heparin  7,500 Units Subcutaneous Q8H   insulin aspart  0-15 Units Subcutaneous TID WC   insulin aspart  8 Units Subcutaneous TID WC   insulin detemir  15 Units Subcutaneous QHS   Ipratropium-Albuterol  1 puff Inhalation TID   pantoprazole  40 mg Oral BID   sodium chloride flush  3 mL Intravenous Q12H   vitamin C  500 mg Oral Daily   zinc sulfate  220 mg Oral Daily   Continuous Infusions:  sodium chloride     sodium chloride       LOS: 16 days    Time spent: 35 minutes spent in the coordination of care today.   Domenic Polite  MD   04/28/2019, 1:33 PM

## 2019-04-28 NOTE — Progress Notes (Signed)
Physical Therapy Treatment Patient Details Name: Cory Miller MRN: [id number] DOB: 12-31-1940 Today's Date: 04/28/2019    History of Present Illness Cory Miller is a 78 y.o. male with medical history significant of HTN, HLD, CAD  s/p CABG, ESRD on HD(M/W/F), DM type II, CVA, GERD, and hard of hearing; who presented to [facility] with complaints of fever, chills, cough, and shortness of breath approximately 2 weeks.  Reports having fevers up to 101 F at home. found to be Covid 19 +    PT Comments    Continuing work on functional mobility and activity tolerance;  Session done after HD, which was telling of his activity tolerance; Noted low BP with simple amb to bathroom, leading to significantly decr activity tolerance and incr fatigue;    BP  81/53 (seated on toilet)  97/53 (LEs elevated in recliner)  112//61 (approx 3 min later seated in chair)  Pt on RA with O2 >94% Max HR low 120s  At this point, I'm concerned for his safety managing at home by himself on HD days; We still need to consider SNF for post-acute rehab to maximize independence and safety with mobility   Follow Up Recommendations  SNF     Equipment Recommendations  Rolling walker with 5" wheels    Recommendations for Other Services       Precautions / Restrictions Precautions Precautions: Fall    Mobility  Bed Mobility Overal bed mobility: Needs Assistance Bed Mobility: Supine to Sit     Supine to sit: Min guard     General bed mobility comments: Minguard assist for safety and lines  Transfers Overall transfer level: Needs assistance Equipment used: Straight cane Transfers: Sit to/from Stand Sit to Stand: Min assist         General transfer comment: minA for power up and management of lines, especially from 3in1 over toilet; cues to initiate  Ambulation/Gait Ambulation/Gait assistance: Min guard Gait Distance (Feet): 20 Feet Assistive device: Straight cane Gait  Pattern/deviations: Step-through pattern     General Gait Details: Walked with his own cane to bathroom; fatigued with incr time with upright/sitting activity, and after using toilet, unable to walk back to bed due to decr BP; brought recliner closer to bathroom   Stairs             Wheelchair Mobility    Modified Rankin (Stroke Patients Only)       Balance     Sitting balance-Leahy Scale: Good       Standing balance-Leahy Scale: Fair Standing balance comment: Relies on UE support on [facility]                            Cognition Arousal/Alertness: Awake/alert Behavior During Therapy: WFL for tasks assessed/performed Overall Cognitive Status: Difficult to assess                                 General Comments: Following commands and retaining education.       Exercises      General Comments General comments (skin integrity, edema, etc.): Majority of session conducted on room Air      Pertinent Vitals/Pain Pain Assessment: No/denies pain    Home Living                      Prior Function  PT Goals (current goals can now be found in the care plan section) Acute Rehab PT Goals Patient Stated Goal: Did not state today, but did want to use the bathroom PT Goal Formulation: With patient Time For Goal Achievement: 04/30/19 Potential to Achieve Goals: Good Progress towards PT goals: Not progressing toward goals - comment(Limtied by low BP post HD)    Frequency    Min 3X/week      PT Plan Current plan remains appropriate    Co-evaluation PT/OT/SLP Co-Evaluation/Treatment: Yes Reason for Co-Treatment: Complexity of the patient's impairments (multi-system involvement);For patient/therapist safety PT goals addressed during session: Mobility/safety with mobility        AM-PAC PT "6 Clicks" Mobility   Outcome Measure  Help needed turning from your back to your side while in a flat bed without using  bedrails?: None Help needed moving from lying on your back to sitting on the side of a flat bed without using bedrails?: A Little Help needed moving to and from a bed to a chair (including a wheelchair)?: A Little Help needed standing up from a chair using your arms (e.g., wheelchair or bedside chair)?: A Little Help needed to walk in hospital room?: A Little Help needed climbing 3-5 steps with a railing? : A Lot 6 Click Score: 18    End of Session Equipment Utilized During Treatment: Gait belt Activity Tolerance: Other (comment)(Limited by low BP) Patient left: in chair;with bed alarm set;with chair alarm set Nurse Communication: Mobility status;Other (comment) PT Visit Diagnosis: Difficulty in walking, not elsewhere classified (R26.2);Other (comment)     Time: 1450-1520(in and out times are approximate) PT Time Calculation (min) (ACUTE ONLY): 30 min  Charges:  $Gait Training: 8-22 mins                     Cory Miller, [place]  Acute Rehabilitation Services Pager [phone] Office [place] 04/28/2019, 4:35 PM

## 2019-04-28 NOTE — Procedures (Signed)
Patient was seen on dialysis and the procedure was supervised.  BFR 400  Via AVF BP is  123/63.   Patient appears to be tolerating treatment well- done in [place] 04/28/2019

## 2019-04-28 NOTE — Progress Notes (Signed)
Occupational Therapy Treatment Patient Details Name: Cory Miller MRN: [id number] DOB: [birth year] Today's Date: 04/28/2019    History of present illness Cory Miller is a 78 y.o. male with medical history significant of HTN, HLD, CAD  s/p CABG, ESRD on HD(M/W/F), DM type II, CVA, GERD, and hard of hearing; who presented to [facility] with complaints of fever, chills, cough, and shortness of breath approximately 2 weeks.  Reports having fevers up to 101 F at home. found to be Covid 19 +   OT comments  Pt very sleepy today, had HD this AM, but is agreeable to OOB to bathroom and recliner.  Pt requiring minA for room level mobility initially to bathroom using SPC; completing toileting with overall modA. Pt noted to have increased lethargy sitting on toilet and required cues to remain awake (vitals monitored, see below, notable decreased in BP). Given low BP brought recliner to pt and pt performing stand pivot transfer from toilet with minA+2. Given level of fatigue post HD and pt requiring increased assist for mobility/ADL feel SNF recommendation remains appropriate recommendation at this time. Will continue to follow acutely.   BP  81/53 (seated on toilet)  97/53 (LEs elevated in recliner)  112//61 (approx 3 min later seated in chair)  Pt on RA with O2 >94% Max HR low 120s    Follow Up Recommendations  SNF;Supervision/Assistance - 24 hour    Equipment Recommendations  None recommended by OT          Precautions / Restrictions Precautions Precautions: Fall       Mobility Bed Mobility Overal bed mobility: Needs Assistance Bed Mobility: Supine to Sit     Supine to sit: Min guard     General bed mobility comments: Minguard assist for safety and lines  Transfers Overall transfer level: Needs assistance Equipment used: Straight cane Transfers: Sit to/from Stand Sit to Stand: Min assist         General transfer comment: minA for power up and  management of lines, especially from 3in1 over toilet; cues to initiate    Balance     Sitting balance-Leahy Scale: Good       Standing balance-Leahy Scale: Fair Standing balance comment: Relies on UE support on [facility]                           ADL either performed or assessed with clinical judgement   ADL Overall ADL's : Needs assistance/impaired                         Toilet Transfer: Minimal assistance;+2 for safety/equipment(cane)   Toileting- Clothing Manipulation and Hygiene: Moderate assistance;Sit to/from stand;+2 for safety/equipment Toileting - Clothing Manipulation Details (indicate cue type and reason): assist for gown management and pericare after BM     Functional mobility during ADLs: Minimal assistance;+2 for physical assistance;+2 for safety/equipment General ADL Comments: pt with increased fatigue, weakness today after HD, low BP      Vision       Perception     Praxis      Cognition Arousal/Alertness: Awake/alert Behavior During Therapy: WFL for tasks assessed/performed Overall Cognitive Status: Difficult to assess                                 General Comments: Following commands and retaining education.  Exercises     Shoulder Instructions       General Comments Majority of session conducted on room Air    Pertinent Vitals/ Pain       Pain Assessment: No/denies pain  Home Living                                          Prior Functioning/Environment              Frequency  Min 2X/week        Progress Toward Goals  OT Goals(current goals can now be found in the care plan section)  Progress towards OT goals: Progressing toward goals  Acute Rehab OT Goals Patient Stated Goal: Did not state today, but did want to use the bathroom OT Goal Formulation: With patient Time For Goal Achievement: 04/30/19 Potential to Achieve Goals: Good  Plan Discharge plan remains  appropriate    Co-evaluation    PT/OT/SLP Co-Evaluation/Treatment: Yes Reason for Co-Treatment: For patient/therapist safety;Complexity of the patient's impairments (multi-system involvement) PT goals addressed during session: Mobility/safety with mobility OT goals addressed during session: ADL's and self-care      AM-PAC OT "6 Clicks" Daily Activity     Outcome Measure   Help from another person eating meals?: None Help from another person taking care of personal grooming?: None Help from another person toileting, which includes using toliet, bedpan, or urinal?: A Lot Help from another person bathing (including washing, rinsing, drying)?: A Little Help from another person to put on and taking off regular upper body clothing?: None Help from another person to put on and taking off regular lower body clothing?: A Little 6 Click Score: 20    End of Session Equipment Utilized During Treatment: Gait belt;Other (comment)(SPC)  OT Visit Diagnosis: Unsteadiness on feet (R26.81);Other abnormalities of gait and mobility (R26.89);Muscle weakness (generalized) (M62.81);Other symptoms and signs involving cognitive function   Activity Tolerance Patient tolerated treatment well   Patient Left in chair;with call bell/phone within reach;with chair alarm set   Nurse Communication Mobility status        Time: 1450-1520 OT Time Calculation (min): 30 min  Charges: OT General Charges $OT Visit: 1 Visit OT Treatments $Self Care/Home Management : 8-22 mins  Lou Cal, [place] Pager [phone] Office [phone]    Raymondo Band 04/28/2019, 5:22 PM

## 2019-04-28 NOTE — Progress Notes (Signed)
Patient ID: Cory Miller, male   DOB: [birth year], 78 y.o.   MRN: [id number] S: No issues overnight, on HD now   O:BP (!) 106/54   Pulse 99   Temp (!) 96.8 F (36 C) (Axillary)   Resp (!) 26   Ht 6\' 1"  (1.854 m)   Wt 110.3 kg   SpO2 97%   BMI 32.08 kg/m   Intake/Output Summary (Last 24 hours) at 04/28/2019 8502 Last data filed at 04/27/2019 2000 Gross per 24 hour  Intake 120 ml  Output -  Net 120 ml   Intake/Output: I/O last 3 completed shifts: In: 120 [P.O.:120] Out: -   Intake/Output this shift:  No intake/output data recorded. Weight change:  Direct physical contact not performed out of concern for covid-19 infection and utilizing exam documented by primary team and RN documentation.   Recent Labs  Lab 04/22/19 1006 04/23/19 0426 04/24/19 0502 04/25/19 0621 04/26/19 0640 04/28/19 0500  NA 135 134* 135 136 136 136  K 4.0 4.7 3.9 3.8 4.5 4.3  CL 92* 91* 95* 94* 95* 96*  CO2 25 24 25 23  19* 23  GLUCOSE 264* 347* 254* 202* 152* 163*  BUN 50* 77* 57* 84* 54* 101*  CREATININE 8.68* 10.28* 7.56* 9.69* 7.59* 11.81*  ALBUMIN 2.6* 2.4* 2.5* 2.4* 2.4* 2.4*  CALCIUM 8.6* 8.4* 8.4* 8.2* 8.2* 8.2*  PHOS 3.8 4.8* 5.5* 5.8* 5.7* 6.1*   Liver Function Tests: Recent Labs  Lab 04/25/19 0621 04/26/19 0640 04/28/19 0500  ALBUMIN 2.4* 2.4* 2.4*   No results for input(s): LIPASE, AMYLASE in the last 168 hours. No results for input(s): AMMONIA in the last 168 hours. CBC: Recent Labs  Lab 04/23/19 0719 04/25/19 0622 04/28/19 0754  WBC 14.1* 13.8* 13.4*  HGB 10.7* 11.6* 10.6*  HCT 32.4* 35.2* 32.3*  MCV 95.9 94.6 96.7  PLT 417* 488* 375   Cardiac Enzymes: No results for input(s): CKTOTAL, CKMB, CKMBINDEX, TROPONINI in the last 168 hours. CBG: Recent Labs  Lab 04/26/19 2217 04/27/19 0806 04/27/19 1215 04/27/19 1746 04/27/19 2235  GLUCAP 142* 150* 222* 230* 161*    Iron Studies:  Recent Labs    04/26/19 0640  FERRITIN 1,860*   Studies/Results: No  results found. [name] atorvastatin  40 mg Oral QHS  . benzonatate  100 mg Oral TID  . carvedilol  6.25 mg Oral BID WC  . Chlorhexidine Gluconate Cloth  6 each Topical Q0600  . clopidogrel  75 mg Oral Daily  . dorzolamide-timolol  1 drop Both Eyes BID  . famotidine  20 mg Oral Daily  . heparin  7,500 Units Subcutaneous Q8H  . insulin aspart  0-15 Units Subcutaneous TID WC  . insulin aspart  8 Units Subcutaneous TID WC  . insulin detemir  15 Units Subcutaneous QHS  . Ipratropium-Albuterol  1 puff Inhalation TID  . pantoprazole  40 mg Oral BID  . sodium chloride flush  3 mL Intravenous Q12H  . vitamin C  500 mg Oral Daily  . zinc sulfate  220 mg Oral Daily    BMET    Component Value Date/Time   NA 136 04/28/2019 0500   K 4.3 04/28/2019 0500   CL 96 (L) 04/28/2019 0500   CO2 23 04/28/2019 0500   GLUCOSE 163 (H) 04/28/2019 0500   BUN 101 (H) 04/28/2019 0500   CREATININE 11.81 (H) 04/28/2019 0500   CALCIUM 8.2 (L) 04/28/2019 0500   GFRNONAA 4 (L) 04/28/2019 0500   GFRAA 4 (L) 04/28/2019 0500  CBC    Component Value Date/Time   WBC 13.4 (H) 04/28/2019 0754   RBC 3.34 (L) 04/28/2019 0754   HGB 10.6 (L) 04/28/2019 0754   HGB 11.9 (L) 04/12/2019 1513   HCT 32.3 (L) 04/28/2019 0754   PLT 375 04/28/2019 0754   MCV 96.7 04/28/2019 0754   MCH 31.7 04/28/2019 0754   MCHC 32.8 04/28/2019 0754   RDW 14.5 04/28/2019 0754   LYMPHSABS 1.0 04/17/2019 0409   MONOABS 0.5 04/17/2019 0409   EOSABS 0.0 04/17/2019 0409   BASOSABS 0.0 04/17/2019 0409     [place] [phone]  MWF, BFR400 DFR600 AVF 4 hours EDW 116kg(new EDW closer to 112.5kg)2K/3Ca  Assessment/Plan: 1. covid-19 pneumonia- now with increased oxygen requirements of 3-5 liters O2 via [place].Repeat swab remains positive for Covid-19 from 5/28.   2. ESRD- cont with HD qMWF via AVF. He is 7 kg below edw and BP's stable. Continue to challenge edw to help with hypoxia. BP is low only on low dose coreg   3. Diarrhea and abdominal pain- workup per primary svc 4. Anemia of ESRD- not on  ESA at present- hgb over 10 5. SHPTH- stable- no binder listed on home med list- phos is starting to creep up- will add a phoslo - no recent PTH, will check one  6. Second degree heart block- per primary 7. Dispo-  Awaiting SNF placement -  Covid is holding up progress ?    Louis Meckel

## 2019-04-28 NOTE — Progress Notes (Signed)
PT Cancellation Note  Patient Details Name: Cory Miller MRN: [id number] DOB: 12-27-1940   Cancelled Treatment:    Reason Eval/Treat Not Completed: Patient at procedure or test/unavailable   Currently in HD;  Will follow up later today as time allows;  Otherwise, will follow up for PT tomorrow;   Thank you,  Cory Miller, PT  Acute Rehabilitation Services Pager [phone] Office [phone]     Cory Miller 04/28/2019, 8:38 AM

## 2019-04-29 LAB — RENAL FUNCTION PANEL
Albumin: 2.7 g/dL — ABNORMAL LOW (ref 3.5–5.0)
Anion gap: 19 — ABNORMAL HIGH (ref 5–15)
BUN: 56 mg/dL — ABNORMAL HIGH (ref 8–23)
CO2: 22 mmol/L (ref 22–32)
Calcium: 8.4 mg/dL — ABNORMAL LOW (ref 8.9–10.3)
Chloride: 93 mmol/L — ABNORMAL LOW (ref 98–111)
Creatinine, Ser: 8.38 mg/dL — ABNORMAL HIGH (ref 0.61–1.24)
GFR calc Af Amer: 6 mL/min — ABNORMAL LOW (ref >60)
GFR calc non Af Amer: 6 mL/min — ABNORMAL LOW (ref >60)
Glucose, Bld: 230 mg/dL — ABNORMAL HIGH (ref 70–99)
Phosphorus: 5.4 mg/dL — ABNORMAL HIGH (ref 2.5–4.6)
Potassium: 4.5 mmol/L (ref 3.5–5.1)
Sodium: 134 mmol/L — ABNORMAL LOW (ref 135–145)

## 2019-04-29 LAB — GLUCOSE, CAPILLARY
Glucose-Capillary: 150 mg/dL — ABNORMAL HIGH (ref 70–99)
Glucose-Capillary: 86 mg/dL (ref 70–99)
Glucose-Capillary: 178 mg/dL — ABNORMAL HIGH (ref 70–99)
Glucose-Capillary: 164 mg/dL — ABNORMAL HIGH (ref 70–99)

## 2019-04-29 LAB — FERRITIN: Ferritin: 1881 ng/mL — ABNORMAL HIGH (ref 24–336)

## 2019-04-29 LAB — C-REACTIVE PROTEIN: CRP: 7.7 mg/dL — ABNORMAL HIGH (ref <1.0)

## 2019-04-29 LAB — D-DIMER, QUANTITATIVE: D-Dimer, Quant: 0.61 ug/mL-FEU — ABNORMAL HIGH (ref 0.00–0.50)

## 2019-04-29 MED ORDER — CHLORHEXIDINE GLUCONATE CLOTH 2 % EX PADS
6.0000 | MEDICATED_PAD | Freq: Every day | CUTANEOUS | Status: DC
  Administered 2019-04-29 – 2019-05-04 (×4): 6 via TOPICAL

## 2019-04-29 MED ORDER — COLCHICINE 0.6 MG PO TABS
0.6000 mg | ORAL_TABLET | Freq: Once | ORAL | Status: AC
  Administered 2019-04-29: 0.6 mg via ORAL
  Filled 2019-04-29: qty 1

## 2019-04-29 NOTE — Plan of Care (Signed)
  Problem: Clinical Measurements: Goal: Respiratory complications will improve Outcome: Progressing Note: No s/s of respiratory complications noted.  Stable on 2L/[place].   

## 2019-04-29 NOTE — Progress Notes (Signed)
Physical Therapy Treatment Patient Details Name: Cory Miller MRN: [id number] DOB: 08/03/1941 Today's Date: 04/29/2019    History of Present Illness Cory Miller is a 78 y.o. male with medical history significant of HTN, HLD, CAD  s/p CABG, ESRD on HD(M/W/F), DM type II, CVA, GERD, and hard of hearing; who presented to [facility] with complaints of fever, chills, cough, and shortness of breath approximately 2 weeks.  Reports having fevers up to 101 F at home. found to be Covid 19 +    PT Comments    Pt with reports of lt ankle pain today. Pt reported it had just started hurting today. Asked pt if he had ever had gout and he reported he had. He isn't sure if this feels like gout. Pt also very sleepy throughout. Would remain awake when stimulated but would fall asleep within 20 sec when not stimulated.   Follow Up Recommendations  SNF     Equipment Recommendations  Rolling walker with 5" wheels    Recommendations for Other Services       Precautions / Restrictions Precautions Precautions: Fall    Mobility  Bed Mobility Overal bed mobility: Needs Assistance Bed Mobility: Supine to Sit;Sit to Supine     Supine to sit: Min assist Sit to supine: Min guard   General bed mobility comments: Assist to elevate trunk into sitting  Transfers Overall transfer level: Needs assistance Equipment used: Straight cane;Rolling walker (2 wheeled) Transfers: Sit to/from Stand Sit to Stand: Min assist         General transfer comment: Assist to bring hips up and for balance  Ambulation/Gait Ambulation/Gait assistance: Min guard Gait Distance (Feet): 12 Feet(x 2) Assistive device: Rolling walker (2 wheeled) Gait Pattern/deviations: Step-through pattern;Decreased weight shift to left;Decreased stance time - left;Antalgic Gait velocity: decr Gait velocity interpretation: <1.31 ft/sec, indicative of household ambulator General Gait Details: Attempted to amb with cane  but pt with c/o's pain in lt ankle and unable to take steps. Switched to walker and pt able to amb to bathroom and back. Amb on RA with SpO2 >90%   Stairs             Wheelchair Mobility    Modified Rankin (Stroke Patients Only)       Balance Overall balance assessment: Needs assistance Sitting-balance support: No upper extremity supported;Feet supported Sitting balance-Leahy Scale: Good     Standing balance support: Bilateral upper extremity supported Standing balance-Leahy Scale: Poor Standing balance comment: walker and min guard for static standing                            Cognition Arousal/Alertness: Lethargic Behavior During Therapy: WFL for tasks assessed/performed Overall Cognitive Status: Difficult to assess                                 General Comments: Pt awake when stimulated but falling asleep within 20 sec when not stimulated. Pt following written commands      Exercises      General Comments        Pertinent Vitals/Pain Pain Assessment: Faces Faces Pain Scale: Hurts even more Pain Location: lt ankle Pain Descriptors / Indicators: Grimacing;Guarding Pain Intervention(s): Limited activity within patient's tolerance;Monitored during session;Repositioned    Home Living  Prior Function            PT Goals (current goals can now be found in the care plan section) Progress towards PT goals: Not progressing toward goals - comment(painful lt ankle)    Frequency    Min 3X/week      PT Plan Current plan remains appropriate    Co-evaluation              AM-PAC PT "6 Clicks" Mobility   Outcome Measure  Help needed turning from your back to your side while in a flat bed without using bedrails?: A Little Help needed moving from lying on your back to sitting on the side of a flat bed without using bedrails?: A Little Help needed moving to and from a bed to a chair (including a  wheelchair)?: A Little Help needed standing up from a chair using your arms (e.g., wheelchair or bedside chair)?: A Little Help needed to walk in hospital room?: A Little Help needed climbing 3-5 steps with a railing? : A Lot 6 Click Score: 17    End of Session Equipment Utilized During Treatment: Gait belt Activity Tolerance: Patient limited by pain;Patient limited by fatigue;Patient limited by lethargy Patient left: with bed alarm set;in bed;with call bell/phone within reach Nurse Communication: Mobility status;Other (comment)(pain in lt ankle) PT Visit Diagnosis: Difficulty in walking, not elsewhere classified (R26.2);Other (comment);Pain Pain - Right/Left: Left Pain - part of body: Ankle and joints of foot     Time: 1455-1528 PT Time Calculation (min) (ACUTE ONLY): 33 min  Charges:  $Gait Training: 23-37 mins                     [place] Pager [phone] Office [place] 04/29/2019, 5:04 PM

## 2019-04-29 NOTE — Progress Notes (Signed)
[name]  PROGRESS NOTE    Cory Miller  GDJ:[id number] DOB: July 13, 1941 DOA: 04/12/2019 PCP: [name], MD   Brief Narrative:   Cory Miller a 78 y.o.malewith medical history significant ofHTN, HLD, CADs/pCABG, ESRD 23on HD(M/W/F), DM type II, CVA, GERD, and hard of hearing; who [facility] with complaints of fever, chills, cough, and shortness of breath approximately 2 weeks. Reports having fevers up to 101 F at home.  Presented to [facility] ER, chest x-ray noted basilar congestion without any focal airspace opacity. COVID-19 testing was positive [place] on 5/15, transferred to [facility], due to need for hemodialysis. -With supportive care oxygen and IV steroids initially -Followed by nephrology, continued hemodialysis -picked up care from my partner 6/1  Assessment & Plan:  Acute COVID-19 Viral illness,  -Clinically improving with supportive care -Hypoxia improving slowly, initially required 4 L O2 [place] now down to 1 L -Treated with IV steroids x5 days earlier this hospital stay which was completed on 5/28 -Remdesivir contraindicated in ESRD -Actemra deferred for now, if clinically worsens this would be a consideration -Inflammatory markers improving CRP down from 20 a week ago to 4.6 yesterday, slight bump to 7.7 today  -Ferritin down from 2600 to 1500, 1800 today -Clinically remains stable, hypoxia improving, will trend CRP and ferritin -Attempt to wean off O2, currently down to 1 L -Increase activity, ambulation, physical therapy, incentive spirometer -Swab from 5/28 was positive, need to repeat this in the next 1 to 2 days -Discharge planning to SNF which can accept COVID patients & dialysis  ESRD on HD -Continue renal diet, HD today -Nephrology following  Possible UTI/asymptomatic bacteriuria UA urine positive for large leukocytes with >50 WBCs. UCx: Negative.  Completed antibiotics.  Essential hypertension Blood pressure seen  to be elevated up to 191/71 initially on presentation Have been on Norvasc, Coreg, clonidine, torsemide and hydralazine. Currently on coreg only  Diabetes mellitus type 2 uncontrolled with hyperglycemia with renal complication -Hemoglobin A1c 7.8 -Stable, continue Levemir 15 units and NovoLog with meals  Anemia of chronic disease Hemoglobin appears stable Continue to monitor  History of CVA -Plavix and statin  Deconditioning PT/OT consulted recommends SNF.  Indigestion. Continue Pepcid, Maalox.  DVT prophylaxis: heparin Code Status: FULL Disposition Plan: needs snf placement with dialysis capacity , however, repeat covid testing on 5/28 remains positive,   Consultants:   Nephrology  Palliative care    Antimicrobials:  . Rocephin    Subjective: -Reports feeling tired but improving overall, communicated with patient by writing, he is very hard of hearing, dialyzed yesterday, O2 down to 1 L via nasal cannula  Objective: Vitals:   04/29/19 0301 04/29/19 0543 04/29/19 0800 04/29/19 1200  BP:  108/61 (!) 111/55 115/63  Pulse: 86 91 90 86  Resp: 20 20    Temp:  97.7 F (36.5 C)    TempSrc:  Oral    SpO2: (!) 87% 95% 95% 93%  Weight:  110.7 kg    Height:        Intake/Output Summary (Last 24 hours) at 04/29/2019 1419 Last data filed at 04/28/2019 2100 Gross per 24 hour  Intake 240 ml  Output -  Net 240 ml   Filed Weights   04/28/19 0730 04/28/19 1106 04/29/19 0543  Weight: 110.3 kg 108.7 kg 110.7 kg    Examination:  Gen: Awake alert oriented x2, resting comfortably, very hard of hearing HEENT: Neck supple, unable to assess JVD Lungs: Clear bilaterally CVS: S1-S2/regular rate rhythm Abd: soft, Non tender,  non distended, BS present Extremities: Right arm AV fistula  skin: no new rashes Psyc: Appropriate mood and affect, calm/cooperative,     Data Reviewed: I have personally reviewed following labs and imaging studies.  CBC: Recent Labs  Lab  05-08-19 0719 04/25/19 0622 04/28/19 0754  WBC 14.1* 13.8* 13.4*  HGB 10.7* 11.6* 10.6*  HCT 32.4* 35.2* 32.3*  MCV 95.9 94.6 96.7  PLT 417* 488* 353   Basic Metabolic Panel: Recent Labs  Lab 04/24/19 0502 04/25/19 0621 04/26/19 0640 04/28/19 0500 04/29/19 0449  NA 135 136 136 136 134*  K 3.9 3.8 4.5 4.3 4.5  CL 95* 94* 95* 96* 93*  CO2 25 23 19* 23 22  GLUCOSE 254* 202* 152* 163* 230*  BUN 57* 84* 54* 101* 56*  CREATININE 7.56* 9.69* 7.59* 11.81* 8.38*  CALCIUM 8.4* 8.2* 8.2* 8.2* 8.4*  PHOS 5.5* 5.8* 5.7* 6.1* 5.4*   GFR: Estimated Creatinine Clearance: 9.6 mL/min (A) (by C-G formula based on SCr of 8.38 mg/dL (H)). Liver Function Tests: Recent Labs  Lab 04/24/19 0502 04/25/19 0621 04/26/19 0640 04/28/19 0500 04/29/19 0449  ALBUMIN 2.5* 2.4* 2.4* 2.4* 2.7*   No results for input(s): LIPASE, AMYLASE in the last 168 hours. No results for input(s): AMMONIA in the last 168 hours. Coagulation Profile: No results for input(s): INR, PROTIME in the last 168 hours. Cardiac Enzymes: No results for input(s): CKTOTAL, CKMB, CKMBINDEX, TROPONINI in the last 168 hours. BNP (last 3 results) No results for input(s): PROBNP in the last 8760 hours. HbA1C: No results for input(s): HGBA1C in the last 72 hours. CBG: Recent Labs  Lab 04/28/19 1256 04/28/19 1554 04/28/19 2040 04/29/19 0848 04/29/19 1255  GLUCAP 163* 193* 78 150* 86   Lipid Profile: No results for input(s): CHOL, HDL, LDLCALC, TRIG, CHOLHDL, LDLDIRECT in the last 72 hours. Thyroid Function Tests: No results for input(s): TSH, T4TOTAL, FREET4, T3FREE, THYROIDAB in the last 72 hours. Anemia Panel: Recent Labs    04/28/19 0500 04/29/19 0449  FERRITIN 1,540* 1,881*   Sepsis Labs: No results for input(s): PROCALCITON, LATICACIDVEN in the last 168 hours.  Recent Results (from the past 240 hour(s))  SARS Coronavirus 2 (CEPHEID- Performed in [place] hospital lab), Hosp Order     Status: Abnormal    Collection Time: 04/24/19  5:20 PM  Result Value Ref Range Status   SARS Coronavirus 2 POSITIVE (A) NEGATIVE Final    Comment: RESULT CALLED TO, READ BACK BY AND VERIFIED WITH: B RODDY RN 04/24/19 1855 JDW (NOTE) If result is NEGATIVE SARS-CoV-2 target nucleic acids are NOT DETECTED. The SARS-CoV-2 RNA is generally detectable in upper and lower  respiratory specimens during the acute phase of infection. The lowest  concentration of SARS-CoV-2 viral copies this assay can detect is 250  copies / mL. A negative result does not preclude SARS-CoV-2 infection  and should not be used as the sole basis for treatment or other  patient management decisions.  A negative result may occur with  improper specimen collection / handling, submission of specimen other  than nasopharyngeal swab, presence of viral mutation(s) within the  areas targeted by this assay, and inadequate number of viral copies  (<250 copies / mL). A negative result must be combined with clinical  observations, patient history, and epidemiological information. If result is POSITIVE SARS-CoV-2 target nucleic acids are DETECTED. The SARS- CoV-2 RNA is generally detectable in upper and lower  respiratory specimens during the acute phase of infection.  Positive  results  are indicative of active infection with SARS-CoV-2.  Clinical  correlation with patient history and other diagnostic information is  necessary to determine patient infection status.  Positive results do  not rule out bacterial infection or co-infection with other viruses. If result is PRESUMPTIVE POSTIVE SARS-CoV-2 nucleic acids MAY BE PRESENT.   A presumptive positive result was obtained on the submitted specimen  and confirmed on repeat testing.  While 2019 novel coronavirus  (SARS-CoV-2) nucleic acids may be present in the submitted sample  additional confirmatory testing may be necessary for epidemiological  and / or clinical management purposes  to  differentiate between  SARS-CoV-2 and other Sarbecovirus currently known to infect humans.  If clinically indicated additional testing with an alternate test  methodology [phone]) is advised . The SARS-CoV-2 RNA is generally  detectable in upper and lower respiratory specimens during the acute  phase of infection. The expected result is Negative. Fact Sheet for Patients:  StrictlyIdeas.no Fact Sheet for Healthcare Providers: BankingDealers.co.za This test is not yet approved or cleared by the [place] FDA and has been authorized for detection and/or diagnosis of SARS-CoV-2 by FDA under an Emergency Use Authorization (EUA).  This EUA will remain in effect (meaning this test can be used) for the duration of the COVID-19 declaration under Section 564(b)(1) of the Act, 21 U.S.C. section 360bbb-3(b)(1), unless the authorization is terminated or revoked sooner. Performed at [facility], [place] [address] [place], [place] [address]          Radiology Studies: No results found.      Scheduled Meds: . atorvastatin  40 mg Oral QHS  . benzonatate  100 mg Oral TID  . calcium acetate  667 mg Oral TID WC  . carvedilol  6.25 mg Oral BID WC  . Chlorhexidine Gluconate Cloth  6 each Topical Q0600  . Chlorhexidine Gluconate Cloth  6 each Topical Q0600  . clopidogrel  75 mg Oral Daily  . dorzolamide-timolol  1 drop Both Eyes BID  . famotidine  20 mg Oral Daily  . heparin  7,500 Units Subcutaneous Q8H  . insulin aspart  0-15 Units Subcutaneous TID WC  . insulin aspart  8 Units Subcutaneous TID WC  . insulin detemir  15 Units Subcutaneous QHS  . Ipratropium-Albuterol  1 puff Inhalation TID  . pantoprazole  40 mg Oral BID  . sodium chloride flush  3 mL Intravenous Q12H  . vitamin C  500 mg Oral Daily  . zinc sulfate  220 mg Oral Daily   Continuous Infusions: . sodium chloride    . sodium chloride       LOS: 17 days     Time spent: 35 minutes   Domenic Polite  MD   04/29/2019, 2:19 PM

## 2019-04-29 NOTE — Progress Notes (Signed)
Patient ID: Shedric Fredericks, male   DOB: [birth year], 78 y.o.   MRN: [id number] S: No issues overnight  HD yest- removed 1500- tolerated well     O:BP (!) 111/55 (BP Location: Left Arm)   Pulse 90   Temp 97.7 F (36.5 C) (Oral)   Resp 20   Ht 6\' 1"  (1.854 m)   Wt 110.7 kg   SpO2 95%   BMI 32.20 kg/m   Intake/Output Summary (Last 24 hours) at 04/29/2019 0906 Last data filed at 04/28/2019 2100 Gross per 24 hour  Intake 240 ml  Output 1557 ml  Net -1317 ml   Intake/Output: I/O last 3 completed shifts: In: 360 [P.O.:360] Out: 1557 [CNOBS:9628]  Intake/Output this shift:  No intake/output data recorded. Weight change:  Direct physical contact not performed out of concern for covid-19 infection and utilizing exam documented by primary team and RN documentation.   Recent Labs  Lab 04/22/19 1006 04/23/19 0426 04/24/19 0502 04/25/19 0621 04/26/19 0640 04/28/19 0500 04/29/19 0449  NA 135 134* 135 136 136 136 134*  K 4.0 4.7 3.9 3.8 4.5 4.3 4.5  CL 92* 91* 95* 94* 95* 96* 93*  CO2 25 24 25 23  19* 23 22  GLUCOSE 264* 347* 254* 202* 152* 163* 230*  BUN 50* 77* 57* 84* 54* 101* 56*  CREATININE 8.68* 10.28* 7.56* 9.69* 7.59* 11.81* 8.38*  ALBUMIN 2.6* 2.4* 2.5* 2.4* 2.4* 2.4* 2.7*  CALCIUM 8.6* 8.4* 8.4* 8.2* 8.2* 8.2* 8.4*  PHOS 3.8 4.8* 5.5* 5.8* 5.7* 6.1* 5.4*   Liver Function Tests: Recent Labs  Lab 04/26/19 0640 04/28/19 0500 04/29/19 0449  ALBUMIN 2.4* 2.4* 2.7*   No results for input(s): LIPASE, AMYLASE in the last 168 hours. No results for input(s): AMMONIA in the last 168 hours. CBC: Recent Labs  Lab 04/23/19 0719 04/25/19 0622 04/28/19 0754  WBC 14.1* 13.8* 13.4*  HGB 10.7* 11.6* 10.6*  HCT 32.4* 35.2* 32.3*  MCV 95.9 94.6 96.7  PLT 417* 488* 375   Cardiac Enzymes: No results for input(s): CKTOTAL, CKMB, CKMBINDEX, TROPONINI in the last 168 hours. CBG: Recent Labs  Lab 04/28/19 1049 04/28/19 1256 04/28/19 1554 04/28/19 2040 04/29/19 0848  GLUCAP  135* 163* 193* 78 150*    Iron Studies:  Recent Labs    04/29/19 0449  FERRITIN 1,881*   Studies/Results: No results found. [name] atorvastatin  40 mg Oral QHS  . benzonatate  100 mg Oral TID  . calcium acetate  667 mg Oral TID WC  . carvedilol  6.25 mg Oral BID WC  . Chlorhexidine Gluconate Cloth  6 each Topical Q0600  . clopidogrel  75 mg Oral Daily  . dorzolamide-timolol  1 drop Both Eyes BID  . famotidine  20 mg Oral Daily  . heparin  7,500 Units Subcutaneous Q8H  . insulin aspart  0-15 Units Subcutaneous TID WC  . insulin aspart  8 Units Subcutaneous TID WC  . insulin detemir  15 Units Subcutaneous QHS  . Ipratropium-Albuterol  1 puff Inhalation TID  . pantoprazole  40 mg Oral BID  . sodium chloride flush  3 mL Intravenous Q12H  . vitamin C  500 mg Oral Daily  . zinc sulfate  220 mg Oral Daily    BMET    Component Value Date/Time   NA 134 (L) 04/29/2019 0449   K 4.5 04/29/2019 0449   CL 93 (L) 04/29/2019 0449   CO2 22 04/29/2019 0449   GLUCOSE 230 (H) 04/29/2019 0449   BUN  56 (H) 04/29/2019 0449   CREATININE 8.38 (H) 04/29/2019 0449   CALCIUM 8.4 (L) 04/29/2019 0449   GFRNONAA 6 (L) 04/29/2019 0449   GFRAA 6 (L) 04/29/2019 0449   CBC    Component Value Date/Time   WBC 13.4 (H) 04/28/2019 0754   RBC 3.34 (L) 04/28/2019 0754   HGB 10.6 (L) 04/28/2019 0754   HGB 11.9 (L) 04/12/2019 1513   HCT 32.3 (L) 04/28/2019 0754   PLT 375 04/28/2019 0754   MCV 96.7 04/28/2019 0754   MCH 31.7 04/28/2019 0754   MCHC 32.8 04/28/2019 0754   RDW 14.5 04/28/2019 0754   LYMPHSABS 1.0 04/17/2019 0409   MONOABS 0.5 04/17/2019 0409   EOSABS 0.0 04/17/2019 0409   BASOSABS 0.0 04/17/2019 0409     [facility] [place] [phone]  MWF, BFR400 DFR600 AVF 4 hours EDW 116kg(new EDW closer to 112.5kg)2K/3Ca  Assessment/Plan: 1. covid-19 pneumonia-  Repeat swab remains positive for Covid-19 from 5/28.   2. ESRD- cont with HD qMWF via AVF. He is much below edw and  BP's stable. Continue to challenge edw to help with hypoxia. BP is low only on low dose coreg  3. Diarrhea and abdominal pain- workup per primary svc 4. Anemia of ESRD- not on  ESA at present- hgb over 10 5. SHPTH- stable- no binder listed on home med list- phos is starting to creep up- will add a phoslo - no recent PTH, will check one... pending 6. Second degree heart block- per primary 7. Dispo-  Awaiting SNF placement -  Covid is holding up progress it seems     Louis Meckel

## 2019-04-30 LAB — CBC
HCT: 34.9 % — ABNORMAL LOW (ref 39.0–52.0)
Hemoglobin: 11.5 g/dL — ABNORMAL LOW (ref 13.0–17.0)
MCH: 31.2 pg (ref 26.0–34.0)
MCHC: 33 g/dL (ref 30.0–36.0)
MCV: 94.6 fL (ref 80.0–100.0)
Platelets: 293 10*3/uL (ref 150–400)
RBC: 3.69 MIL/uL — ABNORMAL LOW (ref 4.22–5.81)
RDW: 14.4 % (ref 11.5–15.5)
WBC: 12.3 10*3/uL — ABNORMAL HIGH (ref 4.0–10.5)
nRBC: 0 % (ref 0.0–0.2)

## 2019-04-30 LAB — RENAL FUNCTION PANEL
Albumin: 2.4 g/dL — ABNORMAL LOW (ref 3.5–5.0)
Anion gap: 20 — ABNORMAL HIGH (ref 5–15)
BUN: 77 mg/dL — ABNORMAL HIGH (ref 8–23)
CO2: 21 mmol/L — ABNORMAL LOW (ref 22–32)
Calcium: 8.4 mg/dL — ABNORMAL LOW (ref 8.9–10.3)
Chloride: 94 mmol/L — ABNORMAL LOW (ref 98–111)
Creatinine, Ser: 11.09 mg/dL — ABNORMAL HIGH (ref 0.61–1.24)
GFR calc Af Amer: 5 mL/min — ABNORMAL LOW (ref >60)
GFR calc non Af Amer: 4 mL/min — ABNORMAL LOW (ref >60)
Glucose, Bld: 166 mg/dL — ABNORMAL HIGH (ref 70–99)
Phosphorus: 5.4 mg/dL — ABNORMAL HIGH (ref 2.5–4.6)
Potassium: 4.8 mmol/L (ref 3.5–5.1)
Sodium: 135 mmol/L (ref 135–145)

## 2019-04-30 LAB — PARATHYROID HORMONE, INTACT (NO CA): PTH: 337 pg/mL — ABNORMAL HIGH (ref 15–65)

## 2019-04-30 LAB — GLUCOSE, CAPILLARY
Glucose-Capillary: 153 mg/dL — ABNORMAL HIGH (ref 70–99)
Glucose-Capillary: 101 mg/dL — ABNORMAL HIGH (ref 70–99)
Glucose-Capillary: 229 mg/dL — ABNORMAL HIGH (ref 70–99)

## 2019-04-30 LAB — D-DIMER, QUANTITATIVE: D-Dimer, Quant: 0.6 ug/mL-FEU — ABNORMAL HIGH (ref 0.00–0.50)

## 2019-04-30 LAB — FERRITIN: Ferritin: 1726 ng/mL — ABNORMAL HIGH (ref 24–336)

## 2019-04-30 LAB — C-REACTIVE PROTEIN: CRP: 5.8 mg/dL — ABNORMAL HIGH (ref <1.0)

## 2019-04-30 MED ORDER — HEPARIN SODIUM (PORCINE) 1000 UNIT/ML DIALYSIS: 20.0000 [IU]/kg | INTRAMUSCULAR | Status: DC | PRN

## 2019-04-30 NOTE — Progress Notes (Signed)
Patient ID: Cory Miller, male   DOB: [birth year], 78 y.o.   MRN: [id number] S: No issues overnight  - due for HD today   O:BP 128/66 (BP Location: Left Arm)   Pulse 89   Temp 98 F (36.7 C) (Oral)   Resp (!) 24   Ht 6\' 1"  (1.854 m)   Wt 108.8 kg   SpO2 98%   BMI 31.65 kg/m   Intake/Output Summary (Last 24 hours) at 04/30/2019 0849 Last data filed at 04/30/2019 0600 Gross per 24 hour  Intake 243 ml  Output 175 ml  Net 68 ml   Intake/Output: I/O last 3 completed shifts: In: 483 [P.O.:480; I.V.:3] Out: 175 [Urine:175]  Intake/Output this shift:  No intake/output data recorded. Weight change: -1.5 kg Direct physical contact not performed out of concern for covid-19 infection and utilizing exam documented by primary team and RN documentation.   Recent Labs  Lab 04/24/19 0502 04/25/19 0621 04/26/19 0640 04/28/19 0500 04/29/19 0449  NA 135 136 136 136 134*  K 3.9 3.8 4.5 4.3 4.5  CL 95* 94* 95* 96* 93*  CO2 25 23 19* 23 22  GLUCOSE 254* 202* 152* 163* 230*  BUN 57* 84* 54* 101* 56*  CREATININE 7.56* 9.69* 7.59* 11.81* 8.38*  ALBUMIN 2.5* 2.4* 2.4* 2.4* 2.7*  CALCIUM 8.4* 8.2* 8.2* 8.2* 8.4*  PHOS 5.5* 5.8* 5.7* 6.1* 5.4*   Liver Function Tests: Recent Labs  Lab 04/26/19 0640 04/28/19 0500 04/29/19 0449  ALBUMIN 2.4* 2.4* 2.7*   No results for input(s): LIPASE, AMYLASE in the last 168 hours. No results for input(s): AMMONIA in the last 168 hours. CBC: Recent Labs  Lab 04/25/19 0622 04/28/19 0754  WBC 13.8* 13.4*  HGB 11.6* 10.6*  HCT 35.2* 32.3*  MCV 94.6 96.7  PLT 488* 375   Cardiac Enzymes: No results for input(s): CKTOTAL, CKMB, CKMBINDEX, TROPONINI in the last 168 hours. CBG: Recent Labs  Lab 04/29/19 0848 04/29/19 1255 04/29/19 1643 04/29/19 2128 04/30/19 0759  GLUCAP 150* 86 178* 164* 153*    Iron Studies:  Recent Labs    04/29/19 0449  FERRITIN 1,881*   Studies/Results: No results found. [name] atorvastatin  40 mg Oral QHS  .  benzonatate  100 mg Oral TID  . calcium acetate  667 mg Oral TID WC  . carvedilol  6.25 mg Oral BID WC  . Chlorhexidine Gluconate Cloth  6 each Topical Q0600  . Chlorhexidine Gluconate Cloth  6 each Topical Q0600  . clopidogrel  75 mg Oral Daily  . dorzolamide-timolol  1 drop Both Eyes BID  . famotidine  20 mg Oral Daily  . heparin  7,500 Units Subcutaneous Q8H  . insulin aspart  0-15 Units Subcutaneous TID WC  . insulin aspart  8 Units Subcutaneous TID WC  . insulin detemir  15 Units Subcutaneous QHS  . Ipratropium-Albuterol  1 puff Inhalation TID  . pantoprazole  40 mg Oral BID  . sodium chloride flush  3 mL Intravenous Q12H  . vitamin C  500 mg Oral Daily  . zinc sulfate  220 mg Oral Daily    BMET    Component Value Date/Time   NA 134 (L) 04/29/2019 0449   K 4.5 04/29/2019 0449   CL 93 (L) 04/29/2019 0449   CO2 22 04/29/2019 0449   GLUCOSE 230 (H) 04/29/2019 0449   BUN 56 (H) 04/29/2019 0449   CREATININE 8.38 (H) 04/29/2019 0449   CALCIUM 8.4 (L) 04/29/2019 0449   GFRNONAA 6 (  L) 04/29/2019 0449   GFRAA 6 (L) 04/29/2019 0449   CBC    Component Value Date/Time   WBC 13.4 (H) 04/28/2019 0754   RBC 3.34 (L) 04/28/2019 0754   HGB 10.6 (L) 04/28/2019 0754   HGB 11.9 (L) 04/12/2019 1513   HCT 32.3 (L) 04/28/2019 0754   PLT 375 04/28/2019 0754   MCV 96.7 04/28/2019 0754   MCH 31.7 04/28/2019 0754   MCHC 32.8 04/28/2019 0754   RDW 14.5 04/28/2019 0754   LYMPHSABS 1.0 04/17/2019 0409   MONOABS 0.5 04/17/2019 0409   EOSABS 0.0 04/17/2019 0409   BASOSABS 0.0 04/17/2019 0409     [place] [phone]  MWF, BFR400 DFR600 AVF 4 hours EDW 116kg(new EDW closer to 112.5kg)2K/3Ca  Assessment/Plan: 1. covid-19 pneumonia-  Repeat swab remains positive for Covid-19 from 5/28.   2. ESRD- cont with HD qMWF via AVF. He is much below edw and BP's stable. Continue to challenge edw to help with hypoxia. BP is good/low  only on low dose coreg  3. Diarrhea  and abdominal pain- workup per primary svc 4. Anemia of ESRD- not on  ESA at present- hgb over 10 5. SHPTH- stable- no binder listed on home med list- phos is starting to creep up- will add a phoslo - no recent PTH, will check one... pending 6. Second degree heart block- per primary 7. Dispo-  Awaiting SNF placement -  Covid is holding up progress it seems     Louis Meckel

## 2019-04-30 NOTE — Progress Notes (Signed)
PROGRESS NOTE    Cory Miller  ERD:[id number] DOB: Sep 24, 1941 DOA: 04/12/2019 PCP: [name], MD  Brief Narrative:77 y.o.malewith medical history significant ofHTN, HLD, CADs/pCABG, ESRD 23on HD(M/W/F), DM type II, CVA, GERD, and hard of hearing; who presented to [facility] with complaints of fever, chills, cough, and shortness of breath approximately 2 weeks. Reports having fevers up to 101 F at home.  Presented to [facility] ER, chest x-ray noted basilar congestion without any focal airspace opacity. COVID-19 testing was positive [place] on 5/15, transferred to [facility], due to need for hemodialysis. 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 ([place])   HTN (hypertension)   End stage renal disease ([place])   Hypokalemia   Anemia of chronic disease   Palliative care by specialist   Goals of care, counseling/discussion   Advanced care planning/counseling discussion  Acute COVID-19 Viral illness,  -Clinically improving with supportive care -Hypoxia improving slowly, initially required 4 L O2 [place] now down to 1 L -Treated with IV steroids x5 days earlier this hospital stay which was completed on 5/28 -Remdesivir contraindicated in ESRD -Actemra deferred for now, if clinically worsens this would be a consideration -Inflammatory markers improving -Clinically remains stable, hypoxia improving -Attempt to wean off O2, currently down to 1 L -Increase activity, ambulation, physical therapy, incentive spirometer -Swab from 5/28 was positive, repeat co-test-Discharge planning to SNF which can accept COVID patients & dialysis  ESRD on HD -Continue renal diet, HD today -Nephrology following  Possible UTI/asymptomatic bacteriuria UA  urine positive for large leukocytes with >50 WBCs. UCx: Negative.  Completed antibiotics.  Essential hypertension Blood pressure seen to be elevated up to 191/71 initially on presentation Have been on Norvasc, Coreg, clonidine, torsemide and hydralazine. Currently on coreg only   Diabetes mellitus type 2 uncontrolled with hyperglycemia with renal complication -Hemoglobin A1c 7.8 -Stable, continue Levemir 15 units and NovoLog with meals  Anemia of chronic disease Hemoglobin appears stable Continue to monitor  History of CVA -Plavix and statin  Deconditioning PT/OT consulted recommends SNF.  Indigestion. Continue Pepcid, Maalox.  DVT prophylaxis: heparin Code Status: FULL Disposition Plan: needs snf placement with dialysis capacity , however, repeat covid testing on 5/28 remains positive, will repeat coitus today.  Consultants:   Nephrology  Palliative care    Antimicrobials:  Rocephin    Estimated body mass index is 31.65 kg/m as calculated from the following:   Height as of this encounter: 6\' 1"  (1.854 m).   Weight as of this encounter: 108.8 kg.  Subjective: He is awake alert currently undergoing dialysis denies any new complaints  Objective: Vitals:   04/29/19 2316 04/30/19 0440 04/30/19 0458 04/30/19 0803  BP: 133/61 109/66  128/66  Pulse:  91  89  Resp: (!) 25   (!) 24  Temp: 98.3 F (36.8 C) 98 F (36.7 C)    TempSrc: Oral Oral    SpO2: 92% 91%  98%  Weight:   108.8 kg   Height:        Intake/Output Summary (Last 24 hours) at 04/30/2019 1251 Last data filed at 04/30/2019 0600 Gross per 24 hour  Intake 243 ml  Output 175 ml  Net 68 ml   Filed Weights   04/28/19 1106 04/29/19 0543 04/30/19 0458  Weight: 108.7 kg 110.7 kg 108.8  kg    Examination:  General exam: Appears calm and comfortable  Respiratory system: Few scattered rhonchi to auscultation. Respiratory effort normal. Cardiovascular system: S1 & S2 heard, RRR. No JVD,  murmurs, rubs, gallops or clicks. No pedal edema. Gastrointestinal system: Abdomen is nondistended, soft and nontender. No organomegaly or masses felt. Normal bowel sounds heard. Central nervous system: Alert and oriented. No focal neurological deficits. Extremities: Symmetric 5 x 5 power. Skin: No rashes, lesions or ulcers Psychiatry: Judgement and insight appear normal. Mood & affect appropriate.     Data Reviewed: I have personally reviewed following labs and imaging studies  CBC: Recent Labs  Lab 04/25/19 0622 04/28/19 0754 04/30/19 1018  WBC 13.8* 13.4* 12.3*  HGB 11.6* 10.6* 11.5*  HCT 35.2* 32.3* 34.9*  MCV 94.6 96.7 94.6  PLT 488* 375 631   Basic Metabolic Panel: Recent Labs  Lab 04/25/19 0621 04/26/19 0640 04/28/19 0500 04/29/19 0449 04/30/19 1018  NA 136 136 136 134* 135  K 3.8 4.5 4.3 4.5 4.8  CL 94* 95* 96* 93* 94*  CO2 23 19* 23 22 21*  GLUCOSE 202* 152* 163* 230* 166*  BUN 84* 54* 101* 56* 77*  CREATININE 9.69* 7.59* 11.81* 8.38* 11.09*  CALCIUM 8.2* 8.2* 8.2* 8.4* 8.4*  PHOS 5.8* 5.7* 6.1* 5.4* 5.4*   GFR: Estimated Creatinine Clearance: 7.2 mL/min (A) (by C-G formula based on SCr of 11.09 mg/dL (H)). Liver Function Tests: Recent Labs  Lab 04/25/19 0621 04/26/19 0640 04/28/19 0500 04/29/19 0449 04/30/19 1018  ALBUMIN 2.4* 2.4* 2.4* 2.7* 2.4*   No results for input(s): LIPASE, AMYLASE in the last 168 hours. No results for input(s): AMMONIA in the last 168 hours. Coagulation Profile: No results for input(s): INR, PROTIME in the last 168 hours. Cardiac Enzymes: No results for input(s): CKTOTAL, CKMB, CKMBINDEX, TROPONINI in the last 168 hours. BNP (last 3 results) No results for input(s): PROBNP in the last 8760 hours. HbA1C: No results for input(s): HGBA1C in the last 72 hours. CBG: Recent Labs  Lab 04/29/19 0848 04/29/19 1255 04/29/19 1643 04/29/19 2128 04/30/19 0759  GLUCAP 150* 86 178* 164* 153*   Lipid Profile: No results for  input(s): CHOL, HDL, LDLCALC, TRIG, CHOLHDL, LDLDIRECT in the last 72 hours. Thyroid Function Tests: No results for input(s): TSH, T4TOTAL, FREET4, T3FREE, THYROIDAB in the last 72 hours. Anemia Panel: Recent Labs    04/28/19 0500 04/29/19 0449  FERRITIN 1,540* 1,881*   Sepsis Labs: No results for input(s): PROCALCITON, LATICACIDVEN in the last 168 hours.  Recent Results (from the past 240 hour(s))  SARS Coronavirus 2 (CEPHEID- Performed in [place] hospital lab), Hosp Order     Status: Abnormal   Collection Time: 04/24/19  5:20 PM  Result Value Ref Range Status   SARS Coronavirus 2 POSITIVE (A) NEGATIVE Final    Comment: RESULT CALLED TO, READ BACK BY AND VERIFIED WITH: B RODDY RN 04/24/19 1855 JDW (NOTE) If result is NEGATIVE SARS-CoV-2 target nucleic acids are NOT DETECTED. The SARS-CoV-2 RNA is generally detectable in upper and lower  respiratory specimens during the acute phase of infection. The lowest  concentration of SARS-CoV-2 viral copies this assay can detect is 250  copies / mL. A negative result does not preclude SARS-CoV-2 infection  and should not be used as the sole basis for treatment or other  patient management decisions.  A negative result may occur with  improper specimen collection / handling, submission of specimen other  than nasopharyngeal swab, presence of viral  mutation(s) within the  areas targeted by this assay, and inadequate number of viral copies  (<250 copies / mL). A negative result must be combined with clinical  observations, patient history, and epidemiological information. If result is POSITIVE SARS-CoV-2 target nucleic acids are DETECTED. The SARS- CoV-2 RNA is generally detectable in upper and lower  respiratory specimens during the acute phase of infection.  Positive  results are indicative of active infection with SARS-CoV-2.  Clinical  correlation with patient history and other diagnostic information is  necessary to determine  patient infection status.  Positive results do  not rule out bacterial infection or co-infection with other viruses. If result is PRESUMPTIVE POSTIVE SARS-CoV-2 nucleic acids MAY BE PRESENT.   A presumptive positive result was obtained on the submitted specimen  and confirmed on repeat testing.  While 2019 novel coronavirus  (SARS-CoV-2) nucleic acids may be present in the submitted sample  additional confirmatory testing may be necessary for epidemiological  and / or clinical management purposes  to differentiate between  SARS-CoV-2 and other Sarbecovirus currently known to infect humans.  If clinically indicated additional testing with an alternate test  methodology [id number]) is advised . The SARS-CoV-2 RNA is generally  detectable in upper and lower respiratory specimens during the acute  phase of infection. The expected result is Negative. Fact Sheet for Patients:  StrictlyIdeas.no Fact Sheet for Healthcare Providers: BankingDealers.co.za This test is not yet approved or cleared by the [place] FDA and has been authorized for detection and/or diagnosis of SARS-CoV-2 by FDA under an Emergency Use Authorization (EUA).  This EUA will remain in effect (meaning this test can be used) for the duration of the COVID-19 declaration under Section 564(b)(1) of the Act, 21 U.S.C. section 360bbb-3(b)(1), unless the authorization is terminated or revoked sooner. Performed at [facility], [place] [address] [place], [place] [address]          Radiology Studies: No results found.      Scheduled Meds: . atorvastatin  40 mg Oral QHS  . benzonatate  100 mg Oral TID  . calcium acetate  667 mg Oral TID WC  . carvedilol  6.25 mg Oral BID WC  . Chlorhexidine Gluconate Cloth  6 each Topical Q0600  . Chlorhexidine Gluconate Cloth  6 each Topical Q0600  . clopidogrel  75 mg Oral Daily  . dorzolamide-timolol  1 drop Both Eyes BID  .  famotidine  20 mg Oral Daily  . heparin  7,500 Units Subcutaneous Q8H  . insulin aspart  0-15 Units Subcutaneous TID WC  . insulin aspart  8 Units Subcutaneous TID WC  . insulin detemir  15 Units Subcutaneous QHS  . Ipratropium-Albuterol  1 puff Inhalation TID  . pantoprazole  40 mg Oral BID  . sodium chloride flush  3 mL Intravenous Q12H  . vitamin C  500 mg Oral Daily  . zinc sulfate  220 mg Oral Daily   Continuous Infusions: . sodium chloride    . sodium chloride       LOS: 18 days      Georgette Shell, MD [facility]  If 7PM-7AM, please contact night-coverage [email or web address] Password [email or web address] 04/30/2019, 12:51 PM

## 2019-05-01 LAB — RENAL FUNCTION PANEL
Albumin: 2.5 g/dL — ABNORMAL LOW (ref 3.5–5.0)
Anion gap: 15 (ref 5–15)
BUN: 47 mg/dL — ABNORMAL HIGH (ref 8–23)
CO2: 24 mmol/L (ref 22–32)
Calcium: 8.6 mg/dL — ABNORMAL LOW (ref 8.9–10.3)
Chloride: 96 mmol/L — ABNORMAL LOW (ref 98–111)
Creatinine, Ser: 7.53 mg/dL — ABNORMAL HIGH (ref 0.61–1.24)
GFR calc Af Amer: 7 mL/min — ABNORMAL LOW (ref >60)
GFR calc non Af Amer: 6 mL/min — ABNORMAL LOW (ref >60)
Glucose, Bld: 206 mg/dL — ABNORMAL HIGH (ref 70–99)
Phosphorus: 4.5 mg/dL (ref 2.5–4.6)
Potassium: 4.4 mmol/L (ref 3.5–5.1)
Sodium: 135 mmol/L (ref 135–145)

## 2019-05-01 LAB — GLUCOSE, CAPILLARY
Glucose-Capillary: 182 mg/dL — ABNORMAL HIGH (ref 70–99)
Glucose-Capillary: 226 mg/dL — ABNORMAL HIGH (ref 70–99)
Glucose-Capillary: 132 mg/dL — ABNORMAL HIGH (ref 70–99)
Glucose-Capillary: 148 mg/dL — ABNORMAL HIGH (ref 70–99)

## 2019-05-01 LAB — FERRITIN: Ferritin: 1693 ng/mL — ABNORMAL HIGH (ref 24–336)

## 2019-05-01 LAB — C-REACTIVE PROTEIN: CRP: 4.6 mg/dL — ABNORMAL HIGH (ref <1.0)

## 2019-05-01 LAB — D-DIMER, QUANTITATIVE: D-Dimer, Quant: 0.77 ug/mL-FEU — ABNORMAL HIGH (ref 0.00–0.50)

## 2019-05-01 MED ORDER — CHLORHEXIDINE GLUCONATE CLOTH 2 % EX PADS: 6.0000 | MEDICATED_PAD | Freq: Every day | CUTANEOUS | Status: DC

## 2019-05-01 NOTE — Progress Notes (Signed)
Occupational Therapy Treatment Patient Details Name: Cory Miller MRN: [id number] DOB: 09-Oct-1941 Today's Date: 05/01/2019    History of present illness Cory Miller is a 78 y.o. male with medical history significant of HTN, HLD, CAD  s/p CABG, ESRD on HD(M/W/F), DM type II, CVA, GERD, and hard of hearing; who presented to [facility] with complaints of fever, chills, cough, and shortness of breath approximately 2 weeks.  Reports having fevers up to 101 F at home. found to be Covid 19 +   OT comments  Pt presents supine in bed agreeable to therapy session. Pt performing room level mobility using RW today with overall minA. Performed both seated and standing grooming ADL, initially with minA for standing balance progressed to minguard assist. Pt also participating in seated UB/LB exercise. Feel POC remains appropriate at this time. Will continue per POC.    Follow Up Recommendations  SNF;Supervision/Assistance - 24 hour    Equipment Recommendations  None recommended by OT          Precautions / Restrictions Precautions Precautions: Fall Restrictions Weight Bearing Restrictions: No       Mobility Bed Mobility Overal bed mobility: Needs Assistance Bed Mobility: Supine to Sit;Sit to Supine     Supine to sit: Min guard Sit to supine: Min guard   General bed mobility comments: minguard for lines, safety; increased time/effort but no physical assist required  Transfers Overall transfer level: Needs assistance Equipment used: Rolling walker (2 wheeled) Transfers: Sit to/from Stand Sit to Stand: Min guard         General transfer comment: increased time/effort, no physical assist required; stood from EOB and chair during session    Balance Overall balance assessment: Needs assistance Sitting-balance support: No upper extremity supported;Feet supported Sitting balance-Leahy Scale: Good     Standing balance support: Bilateral upper extremity  supported;During functional activity Standing balance-Leahy Scale: Fair Standing balance comment: able to maintain static standing during grooming ADL, though leaning against sink for support                           ADL either performed or assessed with clinical judgement   ADL Overall ADL's : Needs assistance/impaired     Grooming: Oral care;Brushing hair;Min guard;Minimal assistance;Standing;Sitting Grooming Details (indicate cue type and reason): minA progressed to minguard for standing balance             Lower Body Dressing: Minimal assistance;Sit to/from stand Lower Body Dressing Details (indicate cue type and reason): pt reaching to adjust socks seated EOB Without assist             Functional mobility during ADLs: Minimal assistance;Rolling walker General ADL Comments: pt with x1 LOB when returning to EOB requiring minA to stabilize     Vision       Perception     Praxis      Cognition Arousal/Alertness: Awake/alert Behavior During Therapy: WFL for tasks assessed/performed Overall Cognitive Status: Difficult to assess                                 General Comments: pt very hard of hearing so difficult to follow directions; requires cues for safety during session        Exercises Exercises: General Upper Extremity;General Lower Extremity General Exercises - Upper Extremity Shoulder Flexion: AROM;Both;10 reps;Seated General Exercises - Lower Extremity Long Arc Quad: AROM;Both;10 reps;Seated Hip  Flexion/Marching: AROM;Both;10 reps;Seated   Shoulder Instructions       General Comments unable to obtain accurate SpO2 reading, used 2L O2 to ensure adequate oxygen supply during activity    Pertinent Vitals/ Pain       Pain Assessment: No/denies pain Faces Pain Scale: Hurts even more  Home Living                                          Prior Functioning/Environment              Frequency  Min  2X/week        Progress Toward Goals  OT Goals(current goals can now be found in the care plan section)  Progress towards OT goals: Progressing toward goals  Acute Rehab OT Goals Patient Stated Goal: none stated, agreeable to therapy session OT Goal Formulation: With patient Time For Goal Achievement: 05/14/19 Potential to Achieve Goals: Good  Plan Discharge plan remains appropriate    Co-evaluation                 AM-PAC OT "6 Clicks" Daily Activity     Outcome Measure   Help from another person eating meals?: None Help from another person taking care of personal grooming?: A Little Help from another person toileting, which includes using toliet, bedpan, or urinal?: A Lot Help from another person bathing (including washing, rinsing, drying)?: A Little Help from another person to put on and taking off regular upper body clothing?: None Help from another person to put on and taking off regular lower body clothing?: A Little 6 Click Score: 19    End of Session Equipment Utilized During Treatment: Gait belt;Rolling walker;Oxygen  OT Visit Diagnosis: Unsteadiness on feet (R26.81);Other abnormalities of gait and mobility (R26.89);Muscle weakness (generalized) (M62.81);Other symptoms and signs involving cognitive function   Activity Tolerance Patient tolerated treatment well   Patient Left in bed;with call bell/phone within reach;with bed alarm set   Nurse Communication Mobility status        Time: 5053-9767 OT Time Calculation (min): 51 min  Charges: OT General Charges $OT Visit: 1 Visit OT Treatments $Self Care/Home Management : 38-52 mins  Lou Cal, OT [facility] Services Pager [phone] Office [phone]    Raymondo Band 05/01/2019, 5:15 PM

## 2019-05-01 NOTE — Progress Notes (Signed)
PROGRESS NOTE    Cory Miller  YTK:[id number] DOB: May 21, 1941 DOA: 04/12/2019 PCP: [name], MD  Brief Narrative: 78 y.o.malewith medical history significant ofHTN, HLD, CADs/pCABG, ESRD 23on HD(M/W/F), DM type II, CVA, GERD, and hard of hearing; who presented [facility] with complaints of fever, chills, cough, and shortness of breath approximately 2 weeks. Reports having fevers up to 101 F at home. Presented to [facility], chest x-ray noted basilar congestion without any focal airspace opacity. COVID-19 testing was positive [place] on 5/15, transferred to [facility], due to need for hemodialysis. 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 ([place])   HTN (hypertension)   End stage renal disease ([place])   Hypokalemia   Anemia of chronic disease   Palliative care by specialist   Goals of care, counseling/discussion   Advanced care planning/counseling discussion  Acute COVID-19 Viral illness,  -Clinically improving with supportive care -Hypoxia improving slowly, initially required 4 L O2NCnow down to 1 L -Treated with IV steroids x5 days earlier this hospital stay which was completed on 5/28 -Remdesivir contraindicated in ESRD -Actemra deferred for now, if clinically worsens this would be a consideration -Inflammatory markers improving -Clinically remains stable, hypoxia improving -Attempt to wean off O2, currently down to 1 L -Increase activity, ambulation, physical therapy, incentive spirometer -Repeat COVID from 6/ 1 pending once we have a negative covid  test patient can go to SNF.  ESRD on HD -Continue renal diet -Nephrology following  Possible UTI/asymptomatic bacteriuria UA urine positive for large leukocytes  with >50 WBCs. UCx: Negative. Completed antibiotics.  Essential hypertension Blood pressure seen to be elevated up to 191/71 initially on presentation Have been on Norvasc, Coreg, clonidine, torsemide and hydralazine. Currently on coreg only  Diabetes mellitus type 2 uncontrolled with hyperglycemia with renal complication -Hemoglobin A1c 7.8 -Stable, continue Levemir 15 units and NovoLog with meals  Anemia of chronic disease Hemoglobin appears stable Continue to monitor  History of CVA -Plavix and statin  Deconditioning PT/OT consultedrecommends SNF.  Indigestion. Continue Pepcid, Maalox.  DVT prophylaxis:heparin Code Status:FULL Disposition Plan:needs snf placement with dialysis capacity , however, repeat covid testing on 5/28 remains positive, repeat covid 6/1 pending  Consultants:  Nephrology Palliative care   Antimicrobials:  Rocephin   Estimated body mass index is 31.85 kg/m as calculated from the following:   Height as of this encounter: 6\' 1"  (1.854 m).   Weight as of this encounter: 109.5 kg.   Subjective: Awake very hard of hearing trying to eat breakfast  Objective: Vitals:   05/01/19 0500 05/01/19 0745 05/01/19 0956 05/01/19 1200  BP:  100/71    Pulse:  (!) 109 94 95  Resp:  (!) 26 (!) 30 (!) 28  Temp:  97.8 F (36.6 C)    TempSrc:  Oral    SpO2:  92% 98% 98%  Weight: 109.5 kg     Height:        Intake/Output Summary (Last 24 hours) at 05/01/2019 1425 Last data filed at 04/30/2019 2127 Gross per 24 hour  Intake 243 ml  Output -2 ml  Net 245 ml   Filed Weights   04/30/19 1230 04/30/19 1610 05/01/19 0500  Weight: 111.1 kg 111 kg 109.5 kg    Examination:  General exam: Appears calm and comfortable  Respiratory  system: Clear to auscultation. Respiratory effort normal. Cardiovascular system: S1 & S2 heard, RRR. No JVD, murmurs, rubs, gallops or clicks. No pedal edema. Gastrointestinal system: Abdomen is nondistended, soft  and nontender. No organomegaly or masses felt. Normal bowel sounds heard. Central nervous system: Alert and oriented. No focal neurological deficits. Extremities: trace edema Skin: No rashes, lesions or ulcers Psychiatry: Judgement and insight appear normal. Mood & affect appropriate.     Data Reviewed: I have personally reviewed following labs and imaging studies  CBC: Recent Labs  Lab 04/25/19 0622 04/28/19 0754 04/30/19 1018  WBC 13.8* 13.4* 12.3*  HGB 11.6* 10.6* 11.5*  HCT 35.2* 32.3* 34.9*  MCV 94.6 96.7 94.6  PLT 488* 375 865   Basic Metabolic Panel: Recent Labs  Lab 04/26/19 0640 04/28/19 0500 04/29/19 0449 04/30/19 1018 05/01/19 0623  NA 136 136 134* 135 135  K 4.5 4.3 4.5 4.8 4.4  CL 95* 96* 93* 94* 96*  CO2 19* 23 22 21* 24  GLUCOSE 152* 163* 230* 166* 206*  BUN 54* 101* 56* 77* 47*  CREATININE 7.59* 11.81* 8.38* 11.09* 7.53*  CALCIUM 8.2* 8.2* 8.4* 8.4* 8.6*  PHOS 5.7* 6.1* 5.4* 5.4* 4.5   GFR: Estimated Creatinine Clearance: 10.7 mL/min (A) (by C-G formula based on SCr of 7.53 mg/dL (H)). Liver Function Tests: Recent Labs  Lab 04/26/19 0640 04/28/19 0500 04/29/19 0449 04/30/19 1018 05/01/19 0623  ALBUMIN 2.4* 2.4* 2.7* 2.4* 2.5*   No results for input(s): LIPASE, AMYLASE in the last 168 hours. No results for input(s): AMMONIA in the last 168 hours. Coagulation Profile: No results for input(s): INR, PROTIME in the last 168 hours. Cardiac Enzymes: No results for input(s): CKTOTAL, CKMB, CKMBINDEX, TROPONINI in the last 168 hours. BNP (last 3 results) No results for input(s): PROBNP in the last 8760 hours. HbA1C: No results for input(s): HGBA1C in the last 72 hours. CBG: Recent Labs  Lab 04/30/19 0759 04/30/19 1646 04/30/19 2121 05/01/19 0739 05/01/19 1241  GLUCAP 153* 101* 229* 182* 226*   Lipid Profile: No results for input(s): CHOL, HDL, LDLCALC, TRIG, CHOLHDL, LDLDIRECT in the last 72 hours. Thyroid Function Tests: No results  for input(s): TSH, T4TOTAL, FREET4, T3FREE, THYROIDAB in the last 72 hours. Anemia Panel: Recent Labs    04/30/19 1018 05/01/19 0623  FERRITIN 1,726* 1,693*   Sepsis Labs: No results for input(s): PROCALCITON, LATICACIDVEN in the last 168 hours.  Recent Results (from the past 240 hour(s))  SARS Coronavirus 2 (CEPHEID- Performed in [place] hospital lab), Hosp Order     Status: Abnormal   Collection Time: 04/24/19  5:20 PM  Result Value Ref Range Status   SARS Coronavirus 2 POSITIVE (A) NEGATIVE Final    Comment: RESULT CALLED TO, READ BACK BY AND VERIFIED WITH: B RODDY RN 04/24/19 1855 JDW (NOTE) If result is NEGATIVE SARS-CoV-2 target nucleic acids are NOT DETECTED. The SARS-CoV-2 RNA is generally detectable in upper and lower  respiratory specimens during the acute phase of infection. The lowest  concentration of SARS-CoV-2 viral copies this assay can detect is 250  copies / mL. A negative result does not preclude SARS-CoV-2 infection  and should not be used as the sole basis for treatment or other  patient management decisions.  A negative result may occur with  improper specimen collection / handling, submission of specimen other  than nasopharyngeal swab, presence of viral mutation(s) within the  areas targeted by this assay, and inadequate number of viral copies  (<250 copies /  mL). A negative result must be combined with clinical  observations, patient history, and epidemiological information. If result is POSITIVE SARS-CoV-2 target nucleic acids are DETECTED. The SARS- CoV-2 RNA is generally detectable in upper and lower  respiratory specimens during the acute phase of infection.  Positive  results are indicative of active infection with SARS-CoV-2.  Clinical  correlation with patient history and other diagnostic information is  necessary to determine patient infection status.  Positive results do  not rule out bacterial infection or co-infection with other viruses.  If result is PRESUMPTIVE POSTIVE SARS-CoV-2 nucleic acids MAY BE PRESENT.   A presumptive positive result was obtained on the submitted specimen  and confirmed on repeat testing.  While 2019 novel coronavirus  (SARS-CoV-2) nucleic acids may be present in the submitted sample  additional confirmatory testing may be necessary for epidemiological  and / or clinical management purposes  to differentiate between  SARS-CoV-2 and other Sarbecovirus currently known to infect humans.  If clinically indicated additional testing with an alternate test  methodology [phone]) is advised . The SARS-CoV-2 RNA is generally  detectable in upper and lower respiratory specimens during the acute  phase of infection. The expected result is Negative. Fact Sheet for Patients:  StrictlyIdeas.no Fact Sheet for Healthcare Providers: BankingDealers.co.za This test is not yet approved or cleared by the [place] FDA and has been authorized for detection and/or diagnosis of SARS-CoV-2 by FDA under an Emergency Use Authorization (EUA).  This EUA will remain in effect (meaning this test can be used) for the duration of the COVID-19 declaration under Section 564(b)(1) of the Act, 21 U.S.C. section 360bbb-3(b)(1), unless the authorization is terminated or revoked sooner. Performed at [facility], [place] [address], [place], [place] [address]          Radiology Studies: No results found.      Scheduled Meds: . atorvastatin  40 mg Oral QHS  . benzonatate  100 mg Oral TID  . calcium acetate  667 mg Oral TID WC  . carvedilol  6.25 mg Oral BID WC  . Chlorhexidine Gluconate Cloth  6 each Topical Q0600  . Chlorhexidine Gluconate Cloth  6 each Topical Q0600  . clopidogrel  75 mg Oral Daily  . dorzolamide-timolol  1 drop Both Eyes BID  . famotidine  20 mg Oral Daily  . heparin  7,500 Units Subcutaneous Q8H  . insulin aspart  0-15 Units Subcutaneous TID  WC  . insulin aspart  8 Units Subcutaneous TID WC  . insulin detemir  15 Units Subcutaneous QHS  . Ipratropium-Albuterol  1 puff Inhalation TID  . pantoprazole  40 mg Oral BID  . sodium chloride flush  3 mL Intravenous Q12H  . vitamin C  500 mg Oral Daily  . zinc sulfate  220 mg Oral Daily   Continuous Infusions: . sodium chloride    . sodium chloride       LOS: 19 days     Georgette Shell, MD  If 7PM-7AM, please contact night-coverage [email or web address] Password [email or web address] 05/01/2019, 2:25 PM

## 2019-05-01 NOTE — Care Management Important Message (Signed)
Important Message  Patient Details  Name: Cory Miller MRN: [id number] Date of Birth: July 15, 1941   Medicare Important Message Given:  Yes    Zenon Mayo, RN 05/01/2019, 3:50 PM

## 2019-05-01 NOTE — Progress Notes (Signed)
Patient ID: Cory Miller, male   DOB: October 05, 1941, 78 y.o.   MRN: [id number] S: HD yest- ran even - looked like a bp issue - is 100/71 now   O:BP 100/71 (BP Location: Left Arm)   Pulse (!) 109   Temp 97.8 F (36.6 C) (Oral)   Resp (!) 26   Ht 6\' 1"  (1.854 m)   Wt 109.5 kg   SpO2 92%   BMI 31.85 kg/m   Intake/Output Summary (Last 24 hours) at 05/01/2019 0840 Last data filed at 04/30/2019 2127 Gross per 24 hour  Intake 243 ml  Output -2 ml  Net 245 ml   Intake/Output: I/O last 3 completed shifts: In: 54 [P.O.:480; I.V.:6] Out: 173 [Urine:175]  Intake/Output this shift:  No intake/output data recorded. Weight change: 2.3 kg Direct physical contact not performed out of concern for covid-19 infection and utilizing exam documented by primary team and RN documentation.   Recent Labs  Lab 04/25/19 0621 04/26/19 0640 04/28/19 0500 04/29/19 0449 04/30/19 1018 05/01/19 0623  NA 136 136 136 134* 135 135  K 3.8 4.5 4.3 4.5 4.8 4.4  CL 94* 95* 96* 93* 94* 96*  CO2 23 19* 23 22 21* 24  GLUCOSE 202* 152* 163* 230* 166* 206*  BUN 84* 54* 101* 56* 77* 47*  CREATININE 9.69* 7.59* 11.81* 8.38* 11.09* 7.53*  ALBUMIN 2.4* 2.4* 2.4* 2.7* 2.4* 2.5*  CALCIUM 8.2* 8.2* 8.2* 8.4* 8.4* 8.6*  PHOS 5.8* 5.7* 6.1* 5.4* 5.4* 4.5   Liver Function Tests: Recent Labs  Lab 04/29/19 0449 04/30/19 1018 05/01/19 0623  ALBUMIN 2.7* 2.4* 2.5*   No results for input(s): LIPASE, AMYLASE in the last 168 hours. No results for input(s): AMMONIA in the last 168 hours. CBC: Recent Labs  Lab 04/25/19 0622 04/28/19 0754 04/30/19 1018  WBC 13.8* 13.4* 12.3*  HGB 11.6* 10.6* 11.5*  HCT 35.2* 32.3* 34.9*  MCV 94.6 96.7 94.6  PLT 488* 375 293   Cardiac Enzymes: No results for input(s): CKTOTAL, CKMB, CKMBINDEX, TROPONINI in the last 168 hours. CBG: Recent Labs  Lab 04/29/19 2128 04/30/19 0759 04/30/19 1646 04/30/19 2121 05/01/19 0739  GLUCAP 164* 153* 101* 229* 182*    Iron Studies:   Recent Labs    05/01/19 0623  FERRITIN 1,693*   Studies/Results: No results found. [name] atorvastatin  40 mg Oral QHS  . benzonatate  100 mg Oral TID  . calcium acetate  667 mg Oral TID WC  . carvedilol  6.25 mg Oral BID WC  . Chlorhexidine Gluconate Cloth  6 each Topical Q0600  . Chlorhexidine Gluconate Cloth  6 each Topical Q0600  . clopidogrel  75 mg Oral Daily  . dorzolamide-timolol  1 drop Both Eyes BID  . famotidine  20 mg Oral Daily  . heparin  7,500 Units Subcutaneous Q8H  . insulin aspart  0-15 Units Subcutaneous TID WC  . insulin aspart  8 Units Subcutaneous TID WC  . insulin detemir  15 Units Subcutaneous QHS  . Ipratropium-Albuterol  1 puff Inhalation TID  . pantoprazole  40 mg Oral BID  . sodium chloride flush  3 mL Intravenous Q12H  . vitamin C  500 mg Oral Daily  . zinc sulfate  220 mg Oral Daily    BMET    Component Value Date/Time   NA 135 05/01/2019 0623   K 4.4 05/01/2019 0623   CL 96 (L) 05/01/2019 0623   CO2 24 05/01/2019 0623   GLUCOSE 206 (H) 05/01/2019 3734  BUN 47 (H) 05/01/2019 0623   CREATININE 7.53 (H) 05/01/2019 0623   CALCIUM 8.6 (L) 05/01/2019 0623   GFRNONAA 6 (L) 05/01/2019 0623   GFRAA 7 (L) 05/01/2019 0623   CBC    Component Value Date/Time   WBC 12.3 (H) 04/30/2019 1018   RBC 3.69 (L) 04/30/2019 1018   HGB 11.5 (L) 04/30/2019 1018   HGB 11.9 (L) 04/12/2019 1513   HCT 34.9 (L) 04/30/2019 1018   PLT 293 04/30/2019 1018   MCV 94.6 04/30/2019 1018   MCH 31.2 04/30/2019 1018   MCHC 33.0 04/30/2019 1018   RDW 14.4 04/30/2019 1018   LYMPHSABS 1.0 04/17/2019 0409   MONOABS 0.5 04/17/2019 0409   EOSABS 0.0 04/17/2019 0409   BASOSABS 0.0 04/17/2019 0409     [facility] [place] [phone]  MWF, BFR400 DFR600 AVF 4 hours EDW 116kg2K/3Ca  Assessment/Plan: 1. covid-19 pneumonia-  Repeat swab remains positive for Covid-19 from 5/28, from 6/3 pending .   2. ESRD- cont with HD qMWF via AVF- cont on schedule. He is  much below edw and BP's stable. Continue to challenge edw to help with hypoxia- were unable to remove yesterday. only on low dose coreg  3. Diarrhea and abdominal pain- workup per primary svc 4. Anemia of ESRD- not on  ESA at present- hgb over 10- no meds 5. SHPTH- stable- no binder listed on home med list- phos is starting to creep up- added phoslo -  PTH 337- no meds for now 6. Second degree heart block- per primary 7. Dispo-  Awaiting SNF placement -  Covid is holding up progress it seems- can run as covid positive at [facility] on [address]

## 2019-05-02 LAB — NOVEL CORONAVIRUS, NAA (HOSP ORDER, SEND-OUT TO REF LAB; TAT 18-24 HRS): SARS-CoV-2, NAA: NOT DETECTED

## 2019-05-02 LAB — RENAL FUNCTION PANEL
Albumin: 2.7 g/dL — ABNORMAL LOW (ref 3.5–5.0)
Anion gap: 16 — ABNORMAL HIGH (ref 5–15)
BUN: 66 mg/dL — ABNORMAL HIGH (ref 8–23)
CO2: 23 mmol/L (ref 22–32)
Calcium: 8.8 mg/dL — ABNORMAL LOW (ref 8.9–10.3)
Chloride: 97 mmol/L — ABNORMAL LOW (ref 98–111)
Creatinine, Ser: 9.8 mg/dL — ABNORMAL HIGH (ref 0.61–1.24)
GFR calc Af Amer: 5 mL/min — ABNORMAL LOW (ref >60)
GFR calc non Af Amer: 5 mL/min — ABNORMAL LOW (ref >60)
Glucose, Bld: 186 mg/dL — ABNORMAL HIGH (ref 70–99)
Phosphorus: 4.8 mg/dL — ABNORMAL HIGH (ref 2.5–4.6)
Potassium: 4.2 mmol/L (ref 3.5–5.1)
Sodium: 136 mmol/L (ref 135–145)

## 2019-05-02 LAB — CBC
HCT: 33.6 % — ABNORMAL LOW (ref 39.0–52.0)
Hemoglobin: 10.8 g/dL — ABNORMAL LOW (ref 13.0–17.0)
MCH: 31 pg (ref 26.0–34.0)
MCHC: 32.1 g/dL (ref 30.0–36.0)
MCV: 96.6 fL (ref 80.0–100.0)
Platelets: 310 10*3/uL (ref 150–400)
RBC: 3.48 MIL/uL — ABNORMAL LOW (ref 4.22–5.81)
RDW: 14.4 % (ref 11.5–15.5)
WBC: 11.1 10*3/uL — ABNORMAL HIGH (ref 4.0–10.5)
nRBC: 0 % (ref 0.0–0.2)

## 2019-05-02 LAB — GLUCOSE, CAPILLARY
Glucose-Capillary: 140 mg/dL — ABNORMAL HIGH (ref 70–99)
Glucose-Capillary: 243 mg/dL — ABNORMAL HIGH (ref 70–99)
Glucose-Capillary: 243 mg/dL — ABNORMAL HIGH (ref 70–99)
Glucose-Capillary: 71 mg/dL (ref 70–99)

## 2019-05-02 LAB — D-DIMER, QUANTITATIVE: D-Dimer, Quant: 0.91 ug/mL-FEU — ABNORMAL HIGH (ref 0.00–0.50)

## 2019-05-02 LAB — FERRITIN: Ferritin: 1531 ng/mL — ABNORMAL HIGH (ref 24–336)

## 2019-05-02 LAB — C-REACTIVE PROTEIN: CRP: 2.6 mg/dL — ABNORMAL HIGH (ref <1.0)

## 2019-05-02 MED ORDER — HEPARIN SODIUM (PORCINE) 1000 UNIT/ML DIALYSIS: 20.0000 [IU]/kg | INTRAMUSCULAR | Status: DC | PRN

## 2019-05-02 NOTE — Progress Notes (Signed)
CSW sent referral to [facility] as they are accepting COVID + patients. Their [facility] location in [place] can take patient and transport him to dialysis at [facility] they use on [address].   Terri Piedra notified and is working on getting patient placed at [facility], [place] is able to take patient as early as Monday 05/05/19.   [place], [place]

## 2019-05-02 NOTE — Progress Notes (Signed)
Pt telemetry reading AFIB 119.  RN called telemetry and telemetry verified pt is Sinus Tach 119 and not if AFIB.  Will continue to monitor.

## 2019-05-02 NOTE — Progress Notes (Signed)
PROGRESS NOTE    Cory Miller  VQQ:[id number] DOB: 1941/01/26 DOA: 04/12/2019 PCP: [name], MD  Brief Narrative: 78 y.o.malewith medical history significant ofHTN, HLD, CADs/pCABG, ESRD 23on HD(M/W/F), DM type II, CVA, GERD, and hard of hearing; who presented [facility] with complaints of fever, chills, cough, and shortness of breath approximately 2 weeks. Reports having fevers up to 101 F at home. Presented to [facility] ER, chest x-ray noted basilar congestion without any focal airspace opacity. COVID-19 testing was positive [place] on 5/15, transferred to [facility], due to need for hemodialysis. -With supportive care oxygen and IV steroids initially -Followed by nephrology, continued hemodialysis -picked up care from my partner 6/3 Assessment & Plan:   Principal Problem:   COVID-19 virus infection Active Problems:   Diabetes mellitus with end stage renal disease ([place])   HTN (hypertension)   End stage renal disease ([place])   Hypokalemia   Anemia of chronic disease   Palliative care by specialist   Goals of care, counseling/discussion   Advanced care planning/counseling discussion    Acute COVID-19 Viral illness,  -Clinically improving with supportive care -Hypoxia improving slowly, initially required 4 L O2NCnow down to 1 L -Treated with IV steroids x5 days earlier this hospital stay which was completed on 5/28 -Remdesivir contraindicated in ESRD -Actemra deferred for now, if clinically worsens this would be a consideration -Inflammatory markers improving -Clinically remains stable, hypoxia improving -Attempt to wean off O2, currently down to 1 L -Increase activity, ambulation, physical therapy, incentive spirometer -Repeat COVID from 6/ 1 pending once we have a negative covid  test patient can go to SNF.  ESRD on HD -Continue renal diet -Nephrology following  Possible UTI/asymptomatic bacteriuria UA urine positive for large leukocytes  with >50 WBCs. UCx: Negative. Completed antibiotics.  Essential hypertension Blood pressure seen to be elevated up to 191/71 initially on presentation Have been on Norvasc, Coreg, clonidine, torsemide and hydralazine. Currently on coreg only  Diabetes mellitus type 2 uncontrolled with hyperglycemia with renal complication -Hemoglobin A1c 7.8 -Stable, continue Levemir 15 units and NovoLog with meals  Anemia of chronic disease Hemoglobin appears stable Continue to monitor  History of CVA -Plavix and statin  Deconditioning PT/OT consultedrecommends SNF. Indigestion. Continue Pepcid, Maalox.  DVT prophylaxis:heparin Code Status:FULL Disposition Plan:needs snf placement with dialysis capacity , however, repeat covid testing on 5/28 remains positive,repeat covid 6/1 pending  Consultants:  Nephrology Palliative care   Antimicrobials:  Rocephin   Estimated body mass index is 31.12 kg/m as calculated from the following:   Height as of this encounter: 6\' 1"  (1.854 m).   Weight as of this encounter: 107 kg.   Subjective: Resting in bed appears comfortable no new complaints  Objective: Vitals:   05/02/19 1030 05/02/19 1045 05/02/19 1100 05/02/19 1112  BP: (!) 86/41 115/75 125/61 (!) 104/54  Pulse: 66 (!) 107 (!) 106 97  Resp: (!) 25 (!) 22 19 12   Temp:    97.8 F (36.6 C)  TempSrc:    Oral  SpO2: 99% 95% 99%   Weight:    107 kg  Height:        Intake/Output Summary (Last 24 hours) at 05/02/2019 1433 Last data filed at 05/02/2019 1112 Gross per 24 hour  Intake 3 ml  Output 1000 ml  Net -997 ml   Filed Weights   05/01/19 0500 05/02/19 0725 05/02/19 1112  Weight: 109.5 kg 108.1 kg 107 kg    Examination:  General exam: Appears calm and comfortable  Respiratory  system: Clear to auscultation. Respiratory effort normal. Cardiovascular system: S1 & S2 heard, RRR. No JVD, murmurs, rubs, gallops or clicks. No pedal edema. Gastrointestinal system:  Abdomen is nondistended, soft and nontender. No organomegaly or masses felt. Normal bowel sounds heard. Central nervous system: Alert and oriented. No focal neurological deficits. Extremities: Symmetric 5 x 5 power. Skin: No rashes, lesions or ulcers Psychiatry: Judgement and insight appear normal. Mood & affect appropriate.     Data Reviewed: I have personally reviewed following labs and imaging studies  CBC: Recent Labs  Lab 04/28/19 0754 04/30/19 1018 05/02/19 0756  WBC 13.4* 12.3* 11.1*  HGB 10.6* 11.5* 10.8*  HCT 32.3* 34.9* 33.6*  MCV 96.7 94.6 96.6  PLT 375 293 478   Basic Metabolic Panel: Recent Labs  Lab 04/28/19 0500 04/29/19 0449 04/30/19 1018 05/01/19 0623 05/02/19 0754  NA 136 134* 135 135 136  K 4.3 4.5 4.8 4.4 4.2  CL 96* 93* 94* 96* 97*  CO2 23 22 21* 24 23  GLUCOSE 163* 230* 166* 206* 186*  BUN 101* 56* 77* 47* 66*  CREATININE 11.81* 8.38* 11.09* 7.53* 9.80*  CALCIUM 8.2* 8.4* 8.4* 8.6* 8.8*  PHOS 6.1* 5.4* 5.4* 4.5 4.8*   GFR: Estimated Creatinine Clearance: 8.1 mL/min (A) (by C-G formula based on SCr of 9.8 mg/dL (H)). Liver Function Tests: Recent Labs  Lab 04/28/19 0500 04/29/19 0449 04/30/19 1018 05/01/19 0623 05/02/19 0754  ALBUMIN 2.4* 2.7* 2.4* 2.5* 2.7*   No results for input(s): LIPASE, AMYLASE in the last 168 hours. No results for input(s): AMMONIA in the last 168 hours. Coagulation Profile: No results for input(s): INR, PROTIME in the last 168 hours. Cardiac Enzymes: No results for input(s): CKTOTAL, CKMB, CKMBINDEX, TROPONINI in the last 168 hours. BNP (last 3 results) No results for input(s): PROBNP in the last 8760 hours. HbA1C: No results for input(s): HGBA1C in the last 72 hours. CBG: Recent Labs  Lab 05/01/19 1241 05/01/19 1820 05/01/19 2200 05/02/19 0840 05/02/19 1234  GLUCAP 226* 132* 148* 140* 243*   Lipid Profile: No results for input(s): CHOL, HDL, LDLCALC, TRIG, CHOLHDL, LDLDIRECT in the last 72 hours.  Thyroid Function Tests: No results for input(s): TSH, T4TOTAL, FREET4, T3FREE, THYROIDAB in the last 72 hours. Anemia Panel: Recent Labs    05/01/19 0623 05/02/19 0720  FERRITIN 1,693* 1,531*   Sepsis Labs: No results for input(s): PROCALCITON, LATICACIDVEN in the last 168 hours.  Recent Results (from the past 240 hour(s))  SARS Coronavirus 2 (CEPHEID- Performed in [facility] hospital lab), Hosp Order     Status: Abnormal   Collection Time: 04/24/19  5:20 PM  Result Value Ref Range Status   SARS Coronavirus 2 POSITIVE (A) NEGATIVE Final    Comment: RESULT CALLED TO, READ BACK BY AND VERIFIED WITH: B RODDY RN 04/24/19 1855 JDW (NOTE) If result is NEGATIVE SARS-CoV-2 target nucleic acids are NOT DETECTED. The SARS-CoV-2 RNA is generally detectable in upper and lower  respiratory specimens during the acute phase of infection. The lowest  concentration of SARS-CoV-2 viral copies this assay can detect is 250  copies / mL. A negative result does not preclude SARS-CoV-2 infection  and should not be used as the sole basis for treatment or other  patient management decisions.  A negative result may occur with  improper specimen collection / handling, submission of specimen other  than nasopharyngeal swab, presence of viral mutation(s) within the  areas targeted by this assay, and inadequate number of viral copies  (<  250 copies / mL). A negative result must be combined with clinical  observations, patient history, and epidemiological information. If result is POSITIVE SARS-CoV-2 target nucleic acids are DETECTED. The SARS- CoV-2 RNA is generally detectable in upper and lower  respiratory specimens during the acute phase of infection.  Positive  results are indicative of active infection with SARS-CoV-2.  Clinical  correlation with patient history and other diagnostic information is  necessary to determine patient infection status.  Positive results do  not rule out bacterial infection  or co-infection with other viruses. If result is PRESUMPTIVE POSTIVE SARS-CoV-2 nucleic acids MAY BE PRESENT.   A presumptive positive result was obtained on the submitted specimen  and confirmed on repeat testing.  While 2019 novel coronavirus  (SARS-CoV-2) nucleic acids may be present in the submitted sample  additional confirmatory testing may be necessary for epidemiological  and / or clinical management purposes  to differentiate between  SARS-CoV-2 and other Sarbecovirus currently known to infect humans.  If clinically indicated additional testing with an alternate test  methodology [phone]) is advised . The SARS-CoV-2 RNA is generally  detectable in upper and lower respiratory specimens during the acute  phase of infection. The expected result is Negative. Fact Sheet for Patients:  StrictlyIdeas.no Fact Sheet for Healthcare Providers: BankingDealers.co.za This test is not yet approved or cleared by the [place] FDA and has been authorized for detection and/or diagnosis of SARS-CoV-2 by FDA under an Emergency Use Authorization (EUA).  This EUA will remain in effect (meaning this test can be used) for the duration of the COVID-19 declaration under Section 564(b)(1) of the Act, 21 U.S.C. section 360bbb-3(b)(1), unless the authorization is terminated or revoked sooner. Performed at [facility], [place] [address], [place], [place] [address]   Novel Coronavirus, NAA (hospital order; send-out to ref lab)     Status: None   Collection Time: 04/30/19 12:57 PM  Result Value Ref Range Status   SARS-CoV-2, NAA NOT DETECTED NOT DETECTED Final    Comment: (NOTE) This test was developed and its performance characteristics determined by [employer]. This test has not been FDA cleared or approved. This test has been authorized by FDA under an Emergency Use Authorization (EUA). This test is only authorized for the duration of  time the declaration that circumstances exist justifying the authorization of the emergency use of in vitro diagnostic tests for detection of SARS-CoV-2 virus and/or diagnosis of COVID-19 infection under section 564(b)(1) of the Act, 21 U.S.C. 062IRS-8(N)(4), unless the authorization is terminated or revoked sooner. When diagnostic testing is negative, the possibility of a false negative result should be considered in the context of a patient's recent exposures and the presence of clinical signs and symptoms consistent with COVID-19. An individual without symptoms of COVID-19 and who is not shedding SARS-CoV-2 virus would expect to have a negative (not detected) result in this assay. Performed  At: [facility] [address] [place], [place] [id number] Rush Farmer MD FG:[id number]    [place]  Final    Comment: Performed at [facility], [place] [address] [place], [place] [address]         Radiology Studies: No results found.      Scheduled Meds: . atorvastatin  40 mg Oral QHS  . benzonatate  100 mg Oral TID  . calcium acetate  667 mg Oral TID WC  . carvedilol  6.25 mg Oral BID WC  . Chlorhexidine Gluconate Cloth  6 each  Topical Q0600  . Chlorhexidine Gluconate Cloth  6 each Topical Q0600  . clopidogrel  75 mg Oral Daily  . dorzolamide-timolol  1 drop Both Eyes BID  . famotidine  20 mg Oral Daily  . heparin  7,500 Units Subcutaneous Q8H  . insulin aspart  0-15 Units Subcutaneous TID WC  . insulin aspart  8 Units Subcutaneous TID WC  . insulin detemir  15 Units Subcutaneous QHS  . Ipratropium-Albuterol  1 puff Inhalation TID  . pantoprazole  40 mg Oral BID  . sodium chloride flush  3 mL Intravenous Q12H  . vitamin C  500 mg Oral Daily  . zinc sulfate  220 mg Oral Daily   Continuous Infusions: . sodium chloride    . sodium chloride       LOS: 20 days     Georgette Shell, MD [facility]  If 7PM-7AM, please  contact night-coverage [email or web address] Password [facility] 05/02/2019, 2:33 PM

## 2019-05-02 NOTE — Progress Notes (Signed)
Report received from HD nurse.  HD RN took off 1L during dialysis.

## 2019-05-02 NOTE — Progress Notes (Signed)
CASE MANAGEMENT: please call son Cory Miller [phone]  Has placement concerns

## 2019-05-02 NOTE — Progress Notes (Signed)
Physical Therapy Treatment Patient Details Name: Cory Miller MRN: [id number] DOB: [birth year] Today's Date: 05/02/2019    History of Present Illness Cory Miller is a 78 y.o. male with medical history significant of HTN, HLD, CAD  s/p CABG, ESRD on HD(M/W/F), DM type II, CVA, GERD, and hard of hearing; who presented to [facility] with complaints of fever, chills, cough, and shortness of breath approximately 2 weeks.  Reports having fevers up to 101 F at home. found to be Covid 19 +    PT Comments    Pt admitted with above diagnosis. Pt currently with functional limitations due to the deficits listed below (see PT Problem List). Pt was able to ambulate with RW with good stability.  Able to use bathroom and groom at sink without assist by PT.  Progressing well.  Goals do not need to be revised as pt is progressing to them and should meet them in next 2 weeks.   Continue towards same goals.  Pt will benefit from skilled PT to increase their independence and safety with mobility to allow discharge to the venue listed below.     Follow Up Recommendations  SNF     Equipment Recommendations  Rolling walker with 5" wheels    Recommendations for Other Services OT consult     Precautions / Restrictions Precautions Precautions: Fall Precaution Comments: Fall risk low (especially with RW), but present Restrictions Weight Bearing Restrictions: No    Mobility  Bed Mobility Overal bed mobility: Needs Assistance Bed Mobility: Supine to Sit;Sit to Supine     Supine to sit: Min guard Sit to supine: Min guard   General bed mobility comments: minguard for lines, safety; increased time/effort but no physical assist required  Transfers Overall transfer level: Needs assistance Equipment used: Rolling walker (2 wheeled) Transfers: Sit to/from Stand Sit to Stand: Min guard         General transfer comment: increased time/effort, no physical assist required; stood from  EOB   Ambulation/Gait Ambulation/Gait assistance: Min guard Gait Distance (Feet): 180 Feet Assistive device: Rolling walker (2 wheeled) Gait Pattern/deviations: Step-through pattern;Decreased weight shift to left;Decreased stance time - left;Antalgic Gait velocity: decr Gait velocity interpretation: <1.31 ft/sec, indicative of household ambulator General Gait Details: Ambulated with RW with good stability. No LOB.  Good cadence as well.  No SOB.  Stopped by bathroom and pt used bathroom as well.  Amb on RA with SpO2 >90%   Stairs             Wheelchair Mobility    Modified Rankin (Stroke Patients Only)       Balance Overall balance assessment: Needs assistance Sitting-balance support: No upper extremity supported;Feet supported Sitting balance-Leahy Scale: Good     Standing balance support: Bilateral upper extremity supported;During functional activity Standing balance-Leahy Scale: Fair Standing balance comment: able to maintain static standing during washign hands at sink and combing hair                            Cognition Arousal/Alertness: Awake/alert Behavior During Therapy: WFL for tasks assessed/performed Overall Cognitive Status: Difficult to assess                                 General Comments: pt very hard of hearing so difficult to follow directions; requires cues for safety during session      Exercises General Exercises -  Lower Extremity Long Arc Quad: AROM;Both;10 reps;Seated    General Comments        Pertinent Vitals/Pain Pain Assessment: No/denies pain    Home Living                      Prior Function            PT Goals (current goals can now be found in the care plan section) Acute Rehab PT Goals Patient Stated Goal: none stated, agreeable to therapy session PT Goal Formulation: With patient Time For Goal Achievement: 05/16/19 Potential to Achieve Goals: Good Progress towards PT goals:  Progressing toward goals    Frequency    Min 3X/week      PT Plan Current plan remains appropriate    Co-evaluation              AM-PAC PT "6 Clicks" Mobility   Outcome Measure  Help needed turning from your back to your side while in a flat bed without using bedrails?: A Little Help needed moving from lying on your back to sitting on the side of a flat bed without using bedrails?: A Little Help needed moving to and from a bed to a chair (including a wheelchair)?: A Little Help needed standing up from a chair using your arms (e.g., wheelchair or bedside chair)?: A Little Help needed to walk in hospital room?: A Little Help needed climbing 3-5 steps with a railing? : A Lot 6 Click Score: 17    End of Session Equipment Utilized During Treatment: Gait belt Activity Tolerance: Patient limited by fatigue Patient left: with bed alarm set;in bed;with call bell/phone within reach Nurse Communication: Mobility status PT Visit Diagnosis: Difficulty in walking, not elsewhere classified (R26.2);Other (comment);Pain Pain - Right/Left: Left Pain - part of body: Ankle and joints of foot     Time: 7494-4967 PT Time Calculation (min) (ACUTE ONLY): 21 min  Charges:  $Gait Training: 8-22 mins                     [place] Pager:  [phone]  Office:  [place] 05/02/2019, 4:34 PM

## 2019-05-02 NOTE — Progress Notes (Signed)
Patient ID: Cory Miller, male   DOB: [birth year], 78 y.o.   MRN: [id number] S: no new issues   O:BP (!) 144/67   Pulse 96   Temp 97.6 F (36.4 C) (Oral)   Resp (!) 23   Ht 6\' 1"  (1.854 m)   Wt 108.1 kg   SpO2 100%   BMI 31.44 kg/m   Intake/Output Summary (Last 24 hours) at 05/02/2019 7253 Last data filed at 05/01/2019 2329 Gross per 24 hour  Intake 3 ml  Output -  Net 3 ml   Intake/Output: I/O last 3 completed shifts: In: 14 [P.O.:240; I.V.:6] Out: -   Intake/Output this shift:  No intake/output data recorded. Weight change:  Direct physical contact not performed out of concern for covid-19 infection and utilizing exam documented by primary team and RN documentation.   Recent Labs  Lab 04/26/19 0640 04/28/19 0500 04/29/19 0449 04/30/19 1018 05/01/19 0623 05/02/19 0754  NA 136 136 134* 135 135 136  K 4.5 4.3 4.5 4.8 4.4 4.2  CL 95* 96* 93* 94* 96* 97*  CO2 19* 23 22 21* 24 23  GLUCOSE 152* 163* 230* 166* 206* 186*  BUN 54* 101* 56* 77* 47* 66*  CREATININE 7.59* 11.81* 8.38* 11.09* 7.53* 9.80*  ALBUMIN 2.4* 2.4* 2.7* 2.4* 2.5* 2.7*  CALCIUM 8.2* 8.2* 8.4* 8.4* 8.6* 8.8*  PHOS 5.7* 6.1* 5.4* 5.4* 4.5 4.8*   Liver Function Tests: Recent Labs  Lab 04/30/19 1018 05/01/19 0623 05/02/19 0754  ALBUMIN 2.4* 2.5* 2.7*   No results for input(s): LIPASE, AMYLASE in the last 168 hours. No results for input(s): AMMONIA in the last 168 hours. CBC: Recent Labs  Lab 04/28/19 0754 04/30/19 1018 05/02/19 0756  WBC 13.4* 12.3* 11.1*  HGB 10.6* 11.5* 10.8*  HCT 32.3* 34.9* 33.6*  MCV 96.7 94.6 96.6  PLT 375 293 310   Cardiac Enzymes: No results for input(s): CKTOTAL, CKMB, CKMBINDEX, TROPONINI in the last 168 hours. CBG: Recent Labs  Lab 05/01/19 0739 05/01/19 1241 05/01/19 1820 05/01/19 2200 05/02/19 0840  GLUCAP 182* 226* 132* 148* 140*    Iron Studies:  Recent Labs    05/01/19 0623  FERRITIN 1,693*   Studies/Results: No results found. [name]  atorvastatin  40 mg Oral QHS  . benzonatate  100 mg Oral TID  . calcium acetate  667 mg Oral TID WC  . carvedilol  6.25 mg Oral BID WC  . Chlorhexidine Gluconate Cloth  6 each Topical Q0600  . Chlorhexidine Gluconate Cloth  6 each Topical Q0600  . clopidogrel  75 mg Oral Daily  . dorzolamide-timolol  1 drop Both Eyes BID  . famotidine  20 mg Oral Daily  . heparin  7,500 Units Subcutaneous Q8H  . insulin aspart  0-15 Units Subcutaneous TID WC  . insulin aspart  8 Units Subcutaneous TID WC  . insulin detemir  15 Units Subcutaneous QHS  . Ipratropium-Albuterol  1 puff Inhalation TID  . pantoprazole  40 mg Oral BID  . sodium chloride flush  3 mL Intravenous Q12H  . vitamin C  500 mg Oral Daily  . zinc sulfate  220 mg Oral Daily    BMET    Component Value Date/Time   NA 136 05/02/2019 0754   K 4.2 05/02/2019 0754   CL 97 (L) 05/02/2019 0754   CO2 23 05/02/2019 0754   GLUCOSE 186 (H) 05/02/2019 0754   BUN 66 (H) 05/02/2019 0754   CREATININE 9.80 (H) 05/02/2019 0754   CALCIUM 8.8 (  L) 05/02/2019 0754   GFRNONAA 5 (L) 05/02/2019 0754   GFRAA 5 (L) 05/02/2019 0754   CBC    Component Value Date/Time   WBC 11.1 (H) 05/02/2019 0756   RBC 3.48 (L) 05/02/2019 0756   HGB 10.8 (L) 05/02/2019 0756   HGB 11.9 (L) 04/12/2019 1513   HCT 33.6 (L) 05/02/2019 0756   PLT 310 05/02/2019 0756   MCV 96.6 05/02/2019 0756   MCH 31.0 05/02/2019 0756   MCHC 32.1 05/02/2019 0756   RDW 14.4 05/02/2019 0756   LYMPHSABS 1.0 04/17/2019 0409   MONOABS 0.5 04/17/2019 0409   EOSABS 0.0 04/17/2019 0409   BASOSABS 0.0 04/17/2019 0409     [facility] [place] [phone]  MWF, BFR400 DFR600 AVF 4 hours EDW 116kg2K/3Ca  Assessment/Plan: 1. covid-19 pneumonia-  Repeat swab remains positive for Covid-19 from 5/28, from 6/3 pending .   2. ESRD- cont with HD qMWF via AVF- cont on schedule. He is much below edw and BP's stable. Continue to challenge edw to help with hypoxia- were unable to  remove Wed. only on low dose coreg  3. Diarrhea and abdominal pain- workup per primary svc 4. Anemia of ESRD- not on  ESA at present- hgb over 10- no meds 5. SHPTH- stable- no binder listed on home med list- phos was starting to creep up- added phoslo -  PTH 337- no meds for now 6. Second degree heart block- per primary 7. Dispo-  Awaiting SNF placement -  Covid is holding up progress it seems- can run as covid positive at [facility] on TTS but possibly not able to be placed until negative    [place]

## 2019-05-02 NOTE — Progress Notes (Signed)
Renal Navigator received call from CSW/[name] regarding patient placement at SNF in [place]. She states he would need OP HD at [facility] in [place]. Since this is a [employer] clinic and patient is currently a Cory Miller patient, Renal Navigator needs to evaluate this situation in more depth and will get back in touch with CSW.  Alphonzo Cruise, [place] Renal Navigator [phone]

## 2019-05-03 LAB — SARS CORONAVIRUS 2: SARS Coronavirus 2: DETECTED — AB

## 2019-05-03 LAB — FERRITIN: Ferritin: 1306 ng/mL — ABNORMAL HIGH (ref 24–336)

## 2019-05-03 LAB — GLUCOSE, CAPILLARY
Glucose-Capillary: 272 mg/dL — ABNORMAL HIGH (ref 70–99)
Glucose-Capillary: 258 mg/dL — ABNORMAL HIGH (ref 70–99)
Glucose-Capillary: 162 mg/dL — ABNORMAL HIGH (ref 70–99)
Glucose-Capillary: 127 mg/dL — ABNORMAL HIGH (ref 70–99)
Glucose-Capillary: 113 mg/dL — ABNORMAL HIGH (ref 70–99)

## 2019-05-03 LAB — D-DIMER, QUANTITATIVE: D-Dimer, Quant: 0.61 ug/mL-FEU — ABNORMAL HIGH (ref 0.00–0.50)

## 2019-05-03 LAB — C-REACTIVE PROTEIN: CRP: 2.6 mg/dL — ABNORMAL HIGH (ref <1.0)

## 2019-05-03 MED ORDER — LORAZEPAM 2 MG/ML IJ SOLN
0.5000 mg | Freq: Once | INTRAMUSCULAR | Status: AC
  Administered 2019-05-03: 0.5 mg via INTRAVENOUS
  Filled 2019-05-03: qty 1

## 2019-05-03 MED ORDER — WHITE PETROLATUM EX OINT
TOPICAL_OINTMENT | CUTANEOUS | Status: AC
  Administered 2019-05-03: 0.2
  Filled 2019-05-03: qty 28.35

## 2019-05-03 NOTE — Progress Notes (Signed)
Received prior order for ativan pre-covid swabbing. Pt tolerated swab fairly, but allowed the sample to be taken. Walked to lab.

## 2019-05-03 NOTE — Progress Notes (Signed)
PROGRESS NOTE    Cory Miller  IHK:[id number] DOB: 1941-10-11 DOA: 04/12/2019 PCP: [name], MD  Brief Narrative:78 y.o.malewith medical history significant ofHTN, HLD, CADs/pCABG, ESRD 23on HD(M/W/F), DM type II, CVA, GERD, and hard of hearing; who presented to [facility] with complaints of fever, chills, cough, and shortness of breath approximately 2 weeks. Reports having fevers up to 101 F at home. Presented to [facility] ER, chest x-ray noted basilar congestion without any focal airspace opacity. COVID-19 testing was positive [place] on 5/15, transferred to [facility], due to need for hemodialysis. 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 ([place])   HTN (hypertension)   End stage renal disease ([place])   Hypokalemia   Anemia of chronic disease   Palliative care by specialist   Goals of care, counseling/discussion   Advanced care planning/counseling discussion   Acute COVID-19 Viral illness,  -Clinically improving with supportive care -Hypoxia improving slowly, initially required 4 L O2NCnow down to 1 L -Treated with IV steroids x5 days earlier this hospital stay which was completed on 5/28 -Remdesivir contraindicated in ESRD -Actemra deferred for now, if clinically worsens this would be a consideration -Inflammatory markers improving -Clinically remains stable, hypoxia improving -Attempt to wean off O2, currently down to 1 L -Increase activity, ambulation, physical therapy, incentive spirometer -Repeat COVID from 6/1positive and repeat covid 6/6 positive  once we have a negative covidtest patient can go to SNF.  ESRD on HD -Continue renal diet -Nephrology following  Possible UTI/asymptomatic bacteriuria UA  urine positive for large leukocytes with >50 WBCs. UCx: Negative. Completed antibiotics.  Essential hypertension Blood pressure seen to be elevated up to 191/71 initially on presentation Have been on Norvasc, Coreg, clonidine, torsemide and hydralazine. Was put back on coreg only Now bp soft will dc coreg  Diabetes mellitus type 2 uncontrolled with hyperglycemia with renal complication -Hemoglobin A1c 7.8 -Stable, continue Levemir 15 units and NovoLog with meals  Anemia of chronic disease Hemoglobin appears stable Continue to monitor  History of CVA -Plavix and statin  Deconditioning PT/OT consultedrecommends SNF.  Indigestion. Continue Pepcid, Maalox.  DVT prophylaxis:heparin Code Status:FULL Disposition Plan:needs snf placement with dialysis capacity , however, repeat covid testing on 5/28 remains positive,repeat covid 6/1 and 6/6 positive Family communication dw son Engineer, structural   Consultants:  Nephrology Palliative care   Antimicrobials:  Rocephin  Estimated body mass index is 32.26 kg/m as calculated from the following:   Height as of this encounter: 6\' 1"  (1.854 m).   Weight as of this encounter: 110.9 kg.   Subjective: Resting in bed in nad   Objective: Vitals:   05/02/19 2326 05/03/19 0229 05/03/19 0500 05/03/19 0807  BP: 102/61   98/62  Pulse: (!) 101   (!) 105  Resp: 20     Temp: 98.1 F (36.7 C)   98.4 F (36.9 C)  TempSrc: Oral     SpO2: 95% 95%  94%  Weight:   110.9 kg   Height:        Intake/Output Summary (Last 24 hours) at 05/03/2019 0939 Last data filed at 05/02/2019 1112 Gross per 24 hour  Intake -  Output 1000 ml  Net -1000 ml   Filed Weights   05/02/19 0725 05/02/19 1112 05/03/19 0500  Weight: 108.1 kg 107 kg 110.9  kg    Examination:  General exam: Appears calm and comfortable  Respiratory system: Clear to auscultation. Respiratory effort normal. Cardiovascular system: S1 & S2 heard, RRR. No JVD,  murmurs, rubs, gallops or clicks. No pedal edema. Gastrointestinal system: Abdomen is nondistended, soft and nontender. No organomegaly or masses felt. Normal bowel sounds heard. Central nervous system: Alert and oriented. No focal neurological deficits. Extremities: Symmetric 5 x 5 power. Skin: No rashes, lesions or ulcers Psychiatry: Judgement and insight appear normal. Mood & affect appropriate.     Data Reviewed: I have personally reviewed following labs and imaging studies  CBC: Recent Labs  Lab 04/28/19 0754 04/30/19 1018 05/02/19 0756  WBC 13.4* 12.3* 11.1*  HGB 10.6* 11.5* 10.8*  HCT 32.3* 34.9* 33.6*  MCV 96.7 94.6 96.6  PLT 375 293 832   Basic Metabolic Panel: Recent Labs  Lab 04/28/19 0500 04/29/19 0449 04/30/19 1018 05/01/19 0623 05/02/19 0754  NA 136 134* 135 135 136  K 4.3 4.5 4.8 4.4 4.2  CL 96* 93* 94* 96* 97*  CO2 23 22 21* 24 23  GLUCOSE 163* 230* 166* 206* 186*  BUN 101* 56* 77* 47* 66*  CREATININE 11.81* 8.38* 11.09* 7.53* 9.80*  CALCIUM 8.2* 8.4* 8.4* 8.6* 8.8*  PHOS 6.1* 5.4* 5.4* 4.5 4.8*   GFR: Estimated Creatinine Clearance: 8.2 mL/min (A) (by C-G formula based on SCr of 9.8 mg/dL (H)). Liver Function Tests: Recent Labs  Lab 04/28/19 0500 04/29/19 0449 04/30/19 1018 05/01/19 0623 05/02/19 0754  ALBUMIN 2.4* 2.7* 2.4* 2.5* 2.7*   No results for input(s): LIPASE, AMYLASE in the last 168 hours. No results for input(s): AMMONIA in the last 168 hours. Coagulation Profile: No results for input(s): INR, PROTIME in the last 168 hours. Cardiac Enzymes: No results for input(s): CKTOTAL, CKMB, CKMBINDEX, TROPONINI in the last 168 hours. BNP (last 3 results) No results for input(s): PROBNP in the last 8760 hours. HbA1C: No results for input(s): HGBA1C in the last 72 hours. CBG: Recent Labs  Lab 05/02/19 1234 05/02/19 1657 05/02/19 2133 05/03/19 0532 05/03/19 0756  GLUCAP 243* 243* 71 272* 258*   Lipid Profile: No results for  input(s): CHOL, HDL, LDLCALC, TRIG, CHOLHDL, LDLDIRECT in the last 72 hours. Thyroid Function Tests: No results for input(s): TSH, T4TOTAL, FREET4, T3FREE, THYROIDAB in the last 72 hours. Anemia Panel: Recent Labs    05/02/19 0720 05/03/19 0603  FERRITIN 1,531* 1,306*   Sepsis Labs: No results for input(s): PROCALCITON, LATICACIDVEN in the last 168 hours.  Recent Results (from the past 240 hour(s))  SARS Coronavirus 2 (CEPHEID- Performed in [place] hospital lab), Hosp Order     Status: Abnormal   Collection Time: 04/24/19  5:20 PM  Result Value Ref Range Status   SARS Coronavirus 2 POSITIVE (A) NEGATIVE Final    Comment: RESULT CALLED TO, READ BACK BY AND VERIFIED WITH: B RODDY RN 04/24/19 1855 JDW (NOTE) If result is NEGATIVE SARS-CoV-2 target nucleic acids are NOT DETECTED. The SARS-CoV-2 RNA is generally detectable in upper and lower  respiratory specimens during the acute phase of infection. The lowest  concentration of SARS-CoV-2 viral copies this assay can detect is 250  copies / mL. A negative result does not preclude SARS-CoV-2 infection  and should not be used as the sole basis for treatment or other  patient management decisions.  A negative result may occur with  improper specimen collection / handling, submission of specimen other  than nasopharyngeal swab, presence of viral mutation(s) within  the  areas targeted by this assay, and inadequate number of viral copies  (<250 copies / mL). A negative result must be combined with clinical  observations, patient history, and epidemiological information. If result is POSITIVE SARS-CoV-2 target nucleic acids are DETECTED. The SARS- CoV-2 RNA is generally detectable in upper and lower  respiratory specimens during the acute phase of infection.  Positive  results are indicative of active infection with SARS-CoV-2.  Clinical  correlation with patient history and other diagnostic information is  necessary to determine  patient infection status.  Positive results do  not rule out bacterial infection or co-infection with other viruses. If result is PRESUMPTIVE POSTIVE SARS-CoV-2 nucleic acids MAY BE PRESENT.   A presumptive positive result was obtained on the submitted specimen  and confirmed on repeat testing.  While 2019 novel coronavirus  (SARS-CoV-2) nucleic acids may be present in the submitted sample  additional confirmatory testing may be necessary for epidemiological  and / or clinical management purposes  to differentiate between  SARS-CoV-2 and other Sarbecovirus currently known to infect humans.  If clinically indicated additional testing with an alternate test  methodology [phone]) is advised . The SARS-CoV-2 RNA is generally  detectable in upper and lower respiratory specimens during the acute  phase of infection. The expected result is Negative. Fact Sheet for Patients:  StrictlyIdeas.no Fact Sheet for Healthcare Providers: BankingDealers.co.za This test is not yet approved or cleared by the [place] FDA and has been authorized for detection and/or diagnosis of SARS-CoV-2 by FDA under an Emergency Use Authorization (EUA).  This EUA will remain in effect (meaning this test can be used) for the duration of the COVID-19 declaration under Section 564(b)(1) of the Act, 21 U.S.C. section 360bbb-3(b)(1), unless the authorization is terminated or revoked sooner. Performed at [facility], [place] [address] [place], [place] [address]   Novel Coronavirus, NAA (hospital order; send-out to ref lab)     Status: None   Collection Time: 04/30/19 12:57 PM  Result Value Ref Range Status   SARS-CoV-2, NAA NOT DETECTED NOT DETECTED Final    Comment: (NOTE) This test was developed and its performance characteristics determined by [employer]. This test has not been FDA cleared or approved. This test has been authorized by FDA under an  Emergency Use Authorization (EUA). This test is only authorized for the duration of time the declaration that circumstances exist justifying the authorization of the emergency use of in vitro diagnostic tests for detection of SARS-CoV-2 virus and/or diagnosis of COVID-19 infection under section 564(b)(1) of the Act, 21 U.S.C. 329JME-2(A)(8), unless the authorization is terminated or revoked sooner. When diagnostic testing is negative, the possibility of a false negative result should be considered in the context of a patient's recent exposures and the presence of clinical signs and symptoms consistent with COVID-19. An individual without symptoms of COVID-19 and who is not shedding SARS-CoV-2 virus would expect to have a negative (not detected) result in this assay. Performed  At: [facility] [address] [place], [place] [id number] Rush Farmer MD NL:[id number]    [place]  Final    Comment: Performed at [facility], [place] [address] [place], [place] [address]  SARS Coronavirus 2     Status: Abnormal   Collection Time: 05/03/19  6:24 AM  Result Value Ref Range Status   SARS Coronavirus 2 DETECTED (A) NOT DETECTED Final    Comment: CRITICAL RESULT CALLED TO, READ BACK BY AND VERIFIED WITH:  RN Marlise Eves [phone] FCP (NOTE) SARS-CoV-2 target nucleic acids are DETECTED. The SARS-CoV-2 RNA is generally detectable in upper and lower respiratory specimens during the acute phase of infection. Positive results are indicative of active infection with SARS-CoV-2. Clinical  correlation with patient history and other diagnostic information is necessary to determine patient infection status. Positive results do  not rule out bacterial infection or co-infection with other viruses. The expected result is Not Detected. Fact Sheet for Patients: http://www.biofiredefense.com/wp-content/uploads/2020/03/BIOFIRE-COVID -19-patients.pdf Fact Sheet for  Healthcare Providers: http://www.biofiredefense.com/wp-content/uploads/2020/03/BIOFIRE-COVID -19-hcp.pdf This test is not yet approved or cleared by the [place] and  has been authorized for detection and/or diagnosis of SARS-CoV-2 by FDA under an Emergency  Use Authorization (EUA).  This EUA will remain in effect (meaning this test can be used) for the duration of  the COVID-19 declaration under Section 564(b)(1) of the Act, 21 U.S.C. section 843 647 2971 3(b)(1), unless the authorization is terminated or revoked sooner. Performed at [facility], [place] [address] [place], [place] [address]          Radiology Studies: No results found.      Scheduled Meds: . atorvastatin  40 mg Oral QHS  . benzonatate  100 mg Oral TID  . calcium acetate  667 mg Oral TID WC  . carvedilol  6.25 mg Oral BID WC  . Chlorhexidine Gluconate Cloth  6 each Topical Q0600  . Chlorhexidine Gluconate Cloth  6 each Topical Q0600  . clopidogrel  75 mg Oral Daily  . dorzolamide-timolol  1 drop Both Eyes BID  . famotidine  20 mg Oral Daily  . heparin  7,500 Units Subcutaneous Q8H  . insulin aspart  0-15 Units Subcutaneous TID WC  . insulin aspart  8 Units Subcutaneous TID WC  . insulin detemir  15 Units Subcutaneous QHS  . Ipratropium-Albuterol  1 puff Inhalation TID  . pantoprazole  40 mg Oral BID  . sodium chloride flush  3 mL Intravenous Q12H  . vitamin C  500 mg Oral Daily  . zinc sulfate  220 mg Oral Daily   Continuous Infusions: . sodium chloride    . sodium chloride       LOS: 21 days      Georgette Shell, MD [facility]  If 7PM-7AM, please contact night-coverage [email or web address] Password [facility] 05/03/2019, 9:39 AM

## 2019-05-03 NOTE — Progress Notes (Addendum)
When attempting to collect covid sample, pt had violent physical reaction to uncomfortable/pain of swab. Pt very apologetic, but verbalized that he is willing to allow swab if providers are able to pre-medicate.   On call provider paged.

## 2019-05-03 NOTE — Progress Notes (Signed)
Patient ID: Cory Miller, male   DOB: October 03, 1941, 78 y.o.   MRN: [id number] S: HD yest- removed 1000 tolerated well - COVID test 6/3 neg !  For recheck today.  Also on RA  O:BP 98/62   Pulse (!) 105   Temp 98.4 F (36.9 C)   Resp 20   Ht 6\' 1"  (1.854 m)   Wt 110.9 kg   SpO2 94%   BMI 32.26 kg/m   Intake/Output Summary (Last 24 hours) at 05/03/2019 0826 Last data filed at 05/02/2019 1112 Gross per 24 hour  Intake 240 ml  Output 1000 ml  Net -760 ml   Intake/Output: I/O last 3 completed shifts: In: 243 [P.O.:240; I.V.:3] Out: 1000 [Other:1000]  Intake/Output this shift:  No intake/output data recorded. Weight change:  Direct physical contact not performed out of concern for covid-19 infection and utilizing exam documented by primary team and RN documentation.   Recent Labs  Lab 04/28/19 0500 04/29/19 0449 04/30/19 1018 05/01/19 0623 05/02/19 0754  NA 136 134* 135 135 136  K 4.3 4.5 4.8 4.4 4.2  CL 96* 93* 94* 96* 97*  CO2 23 22 21* 24 23  GLUCOSE 163* 230* 166* 206* 186*  BUN 101* 56* 77* 47* 66*  CREATININE 11.81* 8.38* 11.09* 7.53* 9.80*  ALBUMIN 2.4* 2.7* 2.4* 2.5* 2.7*  CALCIUM 8.2* 8.4* 8.4* 8.6* 8.8*  PHOS 6.1* 5.4* 5.4* 4.5 4.8*   Liver Function Tests: Recent Labs  Lab 04/30/19 1018 05/01/19 0623 05/02/19 0754  ALBUMIN 2.4* 2.5* 2.7*   No results for input(s): LIPASE, AMYLASE in the last 168 hours. No results for input(s): AMMONIA in the last 168 hours. CBC: Recent Labs  Lab 04/28/19 0754 04/30/19 1018 05/02/19 0756  WBC 13.4* 12.3* 11.1*  HGB 10.6* 11.5* 10.8*  HCT 32.3* 34.9* 33.6*  MCV 96.7 94.6 96.6  PLT 375 293 310   Cardiac Enzymes: No results for input(s): CKTOTAL, CKMB, CKMBINDEX, TROPONINI in the last 168 hours. CBG: Recent Labs  Lab 05/02/19 1234 05/02/19 1657 05/02/19 2133 05/03/19 0532 05/03/19 0756  GLUCAP 243* 243* 71 272* 258*    Iron Studies:  Recent Labs    05/03/19 0603  FERRITIN 1,306*    Studies/Results: No results found. [name] atorvastatin  40 mg Oral QHS  . benzonatate  100 mg Oral TID  . calcium acetate  667 mg Oral TID WC  . carvedilol  6.25 mg Oral BID WC  . Chlorhexidine Gluconate Cloth  6 each Topical Q0600  . Chlorhexidine Gluconate Cloth  6 each Topical Q0600  . clopidogrel  75 mg Oral Daily  . dorzolamide-timolol  1 drop Both Eyes BID  . famotidine  20 mg Oral Daily  . heparin  7,500 Units Subcutaneous Q8H  . insulin aspart  0-15 Units Subcutaneous TID WC  . insulin aspart  8 Units Subcutaneous TID WC  . insulin detemir  15 Units Subcutaneous QHS  . Ipratropium-Albuterol  1 puff Inhalation TID  . pantoprazole  40 mg Oral BID  . sodium chloride flush  3 mL Intravenous Q12H  . vitamin C  500 mg Oral Daily  . zinc sulfate  220 mg Oral Daily    BMET    Component Value Date/Time   NA 136 05/02/2019 0754   K 4.2 05/02/2019 0754   CL 97 (L) 05/02/2019 0754   CO2 23 05/02/2019 0754   GLUCOSE 186 (H) 05/02/2019 0754   BUN 66 (H) 05/02/2019 0754   CREATININE 9.80 (H) 05/02/2019 9242  CALCIUM 8.8 (L) 05/02/2019 0754   GFRNONAA 5 (L) 05/02/2019 0754   GFRAA 5 (L) 05/02/2019 0754   CBC    Component Value Date/Time   WBC 11.1 (H) 05/02/2019 0756   RBC 3.48 (L) 05/02/2019 0756   HGB 10.8 (L) 05/02/2019 0756   HGB 11.9 (L) 04/12/2019 1513   HCT 33.6 (L) 05/02/2019 0756   PLT 310 05/02/2019 0756   MCV 96.6 05/02/2019 0756   MCH 31.0 05/02/2019 0756   MCHC 32.1 05/02/2019 0756   RDW 14.4 05/02/2019 0756   LYMPHSABS 1.0 04/17/2019 0409   MONOABS 0.5 04/17/2019 0409   EOSABS 0.0 04/17/2019 0409   BASOSABS 0.0 04/17/2019 0409     [facility] [phone]  MWF, BFR400 DFR600 AVF 4 hours EDW 116kg2K/3Ca  Assessment/Plan: 1. covid-19 pneumonia-  Repeat swab neg from  6/3- recheck today  2. ESRD- cont with HD qMWF via AVF- cont on schedule. He is much below edw and BP's stable. Continue to challenge edw to help with hypoxia-   only on low dose coreg.  Now it seems that will be placed in a SNF that will require a change in dialysis facilities ?  So does not have solid OP HD spot at this time  3. Diarrhea and abdominal pain- workup per primary svc 4. Anemia of ESRD- not on  ESA at present- hgb over 10- no meds 5. SHPTH- stable- no binder listed on home med list- phos was starting to creep up- added phoslo -  PTH 337- no meds for now 6. Second degree heart block- per primary 7. Dispo-  Awaiting SNF placement - - can run as covid positive at [facility] on TTS but possibly not able to be placed until negative - all to be determined    Louis Meckel

## 2019-05-03 NOTE — Progress Notes (Signed)
On call provider pg re: covid testing

## 2019-05-03 NOTE — Progress Notes (Signed)
Pt did not eat all day d/t HD - was given snack.

## 2019-05-04 LAB — C-REACTIVE PROTEIN: CRP: 0.9 mg/dL (ref <1.0)

## 2019-05-04 LAB — GLUCOSE, CAPILLARY
Glucose-Capillary: 165 mg/dL — ABNORMAL HIGH (ref 70–99)
Glucose-Capillary: 148 mg/dL — ABNORMAL HIGH (ref 70–99)
Glucose-Capillary: 186 mg/dL — ABNORMAL HIGH (ref 70–99)
Glucose-Capillary: 207 mg/dL — ABNORMAL HIGH (ref 70–99)

## 2019-05-04 LAB — D-DIMER, QUANTITATIVE: D-Dimer, Quant: 0.78 ug/mL-FEU — ABNORMAL HIGH (ref 0.00–0.50)

## 2019-05-04 LAB — FERRITIN: Ferritin: 1280 ng/mL — ABNORMAL HIGH (ref 24–336)

## 2019-05-04 MED ORDER — CHLORHEXIDINE GLUCONATE CLOTH 2 % EX PADS
6.0000 | MEDICATED_PAD | Freq: Every day | CUTANEOUS | Status: DC
  Administered 2019-05-04 – 2019-05-09 (×6): 6 via TOPICAL

## 2019-05-04 NOTE — Progress Notes (Signed)
PROGRESS NOTE    [facility]  YNW:[id number] DOB: [birth year] DOA: 04/12/2019 PCP: [name], MD  Brief Narrative: 78 y.o.malewith medical history significant ofHTN, HLD, CADs/pCABG, ESRD 23on HD(M/W/F), DM type II, CVA, GERD, and hard of hearing; who [facility] with complaints of fever, chills, cough, and shortness of breath approximately 2 weeks. Reports having fevers up to 101 F at home. Presented to [facility] ER, chest x-ray noted basilar congestion without any focal airspace opacity. COVID-19 testing was positive [place] on 5/15, transferred to [facility], due to need for hemodialysis. -With supportive care oxygen and IV steroids initially -Followed by nephrology, continued hemodialysis -picked up care from my partner 6/3   Assessment & Plan:   Principal Problem:   COVID-19 virus infection Active Problems:   Diabetes mellitus with end stage renal disease ([place])   HTN (hypertension)   End stage renal disease ([place])   Hypokalemia   Anemia of chronic disease   Palliative care by specialist   Goals of care, counseling/discussion   Advanced care planning/counseling discussion   Acute COVID-19 Viral illness,  -Clinically improving with supportive care -Hypoxia improving slowly, initially required 4 L O2NCnow down to 1 L -Treated with IV steroids x5 days earlier this hospital stay which was completed on 5/28 -Remdesivir contraindicated in ESRD -Actemra deferred for now, if clinically worsens this would be a consideration -Inflammatory markers improving -Clinically remains stable, hypoxia improving -Attempt to wean off O2, currently down to 1 L -Increase activity, ambulation, physical therapy, incentive spirometer -Repeat COVID from 6/1positive and repeat covid 6/6 positive  once we have a negative covidtest patient can go to SNF.  ESRD on HD -Continue renal diet -Nephrology following  Possible UTI/asymptomatic bacteriuria UA  urine positive for large leukocytes with >50 WBCs. UCx: Negative. Completed antibiotics.  Essential hypertension Blood pressure seen to be elevated up to 191/71 initially on presentation Have been on Norvasc, Coreg, clonidine, torsemide and hydralazine. Was put back on coreg only Now bp soft will dc coreg  Diabetes mellitus type 2 uncontrolled with hyperglycemia with renal complication -Hemoglobin A1c 7.8 -Stable, continue Levemir 15 units and NovoLog with meals  Anemia of chronic disease Hemoglobin appears stable Continue to monitor  History of CVA -Plavix and statin  Deconditioning PT/OT consultedrecommends SNF.  Indigestion. Continue Pepcid, Maalox.  DVT prophylaxis:heparin Code Status:FULL Disposition Plan:needs snf placement with dialysis capacity , however, repeat covid testing on 5/28 remains positive,repeat covid 6/1 and 6/6 positive Family communication dw son Engineer, structural   Consultants:  Nephrology Palliative care   Antimicrobials:  Rocephin Estimated body mass index is 32.26 kg/m as calculated from the following:   Height as of this encounter: 6\' 1"  (1.854 m).   Weight as of this encounter: 110.9 kg.   Subjective: Resting in bed very hard of hearing denies any complaints  Objective: Vitals:   05/03/19 0807 05/03/19 1300 05/03/19 2103 05/04/19 0900  BP: 98/62  (!) 167/68 (!) 152/67  Pulse: (!) 105  (!) 101 98  Resp:   16   Temp: 98.4 F (36.9 C)  98.6 F (37 C) 98.4 F (36.9 C)  TempSrc:   Oral Oral  SpO2: 94% 94% 93% 94%  Weight:      Height:        Intake/Output Summary (Last 24 hours) at 05/04/2019 1127 Last data filed at 05/04/2019 1100 Gross per 24 hour  Intake 560 ml  Output -  Net 560 ml   Filed Weights   05/02/19 0725 05/02/19 1112  05/03/19 0500  Weight: 108.1 kg 107 kg 110.9 kg    Examination:  General exam: Appears calm and comfortable  Respiratory system: Clear to auscultation. Respiratory effort  normal. Cardiovascular system: S1 & S2 heard, RRR. No JVD, murmurs, rubs, gallops or clicks. No pedal edema. Gastrointestinal system: Abdomen is nondistended, soft and nontender. No organomegaly or masses felt. Normal bowel sounds heard. Central nervous system: Alert and oriented. No focal neurological deficits. Extremities: Symmetric 5 x 5 power. Skin: No rashes, lesions or ulcers Psychiatry: Judgement and insight appear normal. Mood & affect appropriate.     Data Reviewed: I have personally reviewed following labs and imaging studies  CBC: Recent Labs  Lab 04/28/19 0754 04/30/19 1018 05/02/19 0756  WBC 13.4* 12.3* 11.1*  HGB 10.6* 11.5* 10.8*  HCT 32.3* 34.9* 33.6*  MCV 96.7 94.6 96.6  PLT 375 293 376   Basic Metabolic Panel: Recent Labs  Lab 04/28/19 0500 04/29/19 0449 04/30/19 1018 05/01/19 0623 05/02/19 0754  NA 136 134* 135 135 136  K 4.3 4.5 4.8 4.4 4.2  CL 96* 93* 94* 96* 97*  CO2 23 22 21* 24 23  GLUCOSE 163* 230* 166* 206* 186*  BUN 101* 56* 77* 47* 66*  CREATININE 11.81* 8.38* 11.09* 7.53* 9.80*  CALCIUM 8.2* 8.4* 8.4* 8.6* 8.8*  PHOS 6.1* 5.4* 5.4* 4.5 4.8*   GFR: Estimated Creatinine Clearance: 8.2 mL/min (A) (by C-G formula based on SCr of 9.8 mg/dL (H)). Liver Function Tests: Recent Labs  Lab 04/28/19 0500 04/29/19 0449 04/30/19 1018 05/01/19 0623 05/02/19 0754  ALBUMIN 2.4* 2.7* 2.4* 2.5* 2.7*   No results for input(s): LIPASE, AMYLASE in the last 168 hours. No results for input(s): AMMONIA in the last 168 hours. Coagulation Profile: No results for input(s): INR, PROTIME in the last 168 hours. Cardiac Enzymes: No results for input(s): CKTOTAL, CKMB, CKMBINDEX, TROPONINI in the last 168 hours. BNP (last 3 results) No results for input(s): PROBNP in the last 8760 hours. HbA1C: No results for input(s): HGBA1C in the last 72 hours. CBG: Recent Labs  Lab 05/03/19 0756 05/03/19 1137 05/03/19 1629 05/03/19 2021 05/04/19 0832  GLUCAP  258* 162* 127* 113* 165*   Lipid Profile: No results for input(s): CHOL, HDL, LDLCALC, TRIG, CHOLHDL, LDLDIRECT in the last 72 hours. Thyroid Function Tests: No results for input(s): TSH, T4TOTAL, FREET4, T3FREE, THYROIDAB in the last 72 hours. Anemia Panel: Recent Labs    05/03/19 0603 05/04/19 0820  FERRITIN 1,306* 1,280*   Sepsis Labs: No results for input(s): PROCALCITON, LATICACIDVEN in the last 168 hours.  Recent Results (from the past 240 hour(s))  SARS Coronavirus 2 (CEPHEID- Performed in [place] hospital lab), Hosp Order     Status: Abnormal   Collection Time: 04/24/19  5:20 PM  Result Value Ref Range Status   SARS Coronavirus 2 POSITIVE (A) NEGATIVE Final    Comment: RESULT CALLED TO, READ BACK BY AND VERIFIED WITH: B RODDY RN 04/24/19 1855 JDW (NOTE) If result is NEGATIVE SARS-CoV-2 target nucleic acids are NOT DETECTED. The SARS-CoV-2 RNA is generally detectable in upper and lower  respiratory specimens during the acute phase of infection. The lowest  concentration of SARS-CoV-2 viral copies this assay can detect is 250  copies / mL. A negative result does not preclude SARS-CoV-2 infection  and should not be used as the sole basis for treatment or other  patient management decisions.  A negative result may occur with  improper specimen collection / handling, submission of specimen other  than nasopharyngeal swab, presence of viral mutation(s) within the  areas targeted by this assay, and inadequate number of viral copies  (<250 copies / mL). A negative result must be combined with clinical  observations, patient history, and epidemiological information. If result is POSITIVE SARS-CoV-2 target nucleic acids are DETECTED. The SARS- CoV-2 RNA is generally detectable in upper and lower  respiratory specimens during the acute phase of infection.  Positive  results are indicative of active infection with SARS-CoV-2.  Clinical  correlation with patient history and  other diagnostic information is  necessary to determine patient infection status.  Positive results do  not rule out bacterial infection or co-infection with other viruses. If result is PRESUMPTIVE POSTIVE SARS-CoV-2 nucleic acids MAY BE PRESENT.   A presumptive positive result was obtained on the submitted specimen  and confirmed on repeat testing.  While 2019 novel coronavirus  (SARS-CoV-2) nucleic acids may be present in the submitted sample  additional confirmatory testing may be necessary for epidemiological  and / or clinical management purposes  to differentiate between  SARS-CoV-2 and other Sarbecovirus currently known to infect humans.  If clinically indicated additional testing with an alternate test  methodology [phone]) is advised . The SARS-CoV-2 RNA is generally  detectable in upper and lower respiratory specimens during the acute  phase of infection. The expected result is Negative. Fact Sheet for Patients:  StrictlyIdeas.no Fact Sheet for Healthcare Providers: BankingDealers.co.za This test is not yet approved or cleared by the [place] FDA and has been authorized for detection and/or diagnosis of SARS-CoV-2 by FDA under an Emergency Use Authorization (EUA).  This EUA will remain in effect (meaning this test can be used) for the duration of the COVID-19 declaration under Section 564(b)(1) of the Act, 21 U.S.C. section 360bbb-3(b)(1), unless the authorization is terminated or revoked sooner. Performed at [facility], [place] [address] [place], [place] [address]   Novel Coronavirus, NAA (hospital order; send-out to ref lab)     Status: None   Collection Time: 04/30/19 12:57 PM  Result Value Ref Range Status   SARS-CoV-2, NAA NOT DETECTED NOT DETECTED Final    Comment: (NOTE) This test was developed and its performance characteristics determined by [employer]. This test has not been FDA cleared or  approved. This test has been authorized by FDA under an Emergency Use Authorization (EUA). This test is only authorized for the duration of time the declaration that circumstances exist justifying the authorization of the emergency use of in vitro diagnostic tests for detection of SARS-CoV-2 virus and/or diagnosis of COVID-19 infection under section 564(b)(1) of the Act, 21 U.S.C. 831DVV-6(H)(6), unless the authorization is terminated or revoked sooner. When diagnostic testing is negative, the possibility of a false negative result should be considered in the context of a patient's recent exposures and the presence of clinical signs and symptoms consistent with COVID-19. An individual without symptoms of COVID-19 and who is not shedding SARS-CoV-2 virus would expect to have a negative (not detected) result in this assay. Performed  At: [place] [facility] [address] [place], [place] [id number] Rush Farmer MD RS:[id number]    [place]  Final    Comment: Performed at [facility], [place] [address] [place], [place] [address]  SARS Coronavirus 2     Status: Abnormal   Collection Time: 05/03/19  6:24 AM  Result Value Ref Range Status   SARS Coronavirus 2 DETECTED (A) NOT DETECTED Final    Comment: CRITICAL RESULT  CALLED TO, READ BACK BY AND VERIFIED WITH: RN IREKIA B. [id number] FCP (NOTE) SARS-CoV-2 target nucleic acids are DETECTED. The SARS-CoV-2 RNA is generally detectable in upper and lower respiratory specimens during the acute phase of infection. Positive results are indicative of active infection with SARS-CoV-2. Clinical  correlation with patient history and other diagnostic information is necessary to determine patient infection status. Positive results do  not rule out bacterial infection or co-infection with other viruses. The expected result is Not Detected. Fact Sheet for Patients:  http://www.biofiredefense.com/wp-content/uploads/2020/03/BIOFIRE-COVID -19-patients.pdf Fact Sheet for Healthcare Providers: http://www.biofiredefense.com/wp-content/uploads/2020/03/BIOFIRE-COVID -19-hcp.pdf This test is not yet approved or cleared by the [place] and  has been authorized for detection and/or diagnosis of SARS-CoV-2 by FDA under an Emergency  Use Authorization (EUA).  This EUA will remain in effect (meaning this test can be used) for the duration of  the COVID-19 declaration under Section 564(b)(1) of the Act, 21 U.S.C. section [phone] 3(b)(1), unless the authorization is terminated or revoked sooner. Performed at [facility], [place] [address], [place], [place] [address]          Radiology Studies: No results found.      Scheduled Meds: . atorvastatin  40 mg Oral QHS  . benzonatate  100 mg Oral TID  . calcium acetate  667 mg Oral TID WC  . Chlorhexidine Gluconate Cloth  6 each Topical Q0600  . clopidogrel  75 mg Oral Daily  . dorzolamide-timolol  1 drop Both Eyes BID  . famotidine  20 mg Oral Daily  . heparin  7,500 Units Subcutaneous Q8H  . insulin aspart  0-15 Units Subcutaneous TID WC  . insulin aspart  8 Units Subcutaneous TID WC  . insulin detemir  15 Units Subcutaneous QHS  . Ipratropium-Albuterol  1 puff Inhalation TID  . pantoprazole  40 mg Oral BID  . sodium chloride flush  3 mL Intravenous Q12H  . vitamin C  500 mg Oral Daily  . zinc sulfate  220 mg Oral Daily   Continuous Infusions: . sodium chloride    . sodium chloride       LOS: 22 days     Georgette Shell, MD [facility]  If 7PM-7AM, please contact night-coverage [email or web address] Password [facility] 05/04/2019, 11:27 AM

## 2019-05-04 NOTE — Progress Notes (Signed)
Patient ID: Cory Miller, male   DOB: November 11, 1941, 78 y.o.   MRN: [id number] S:  - COVID test 6/3 neg   recheck pending.  Remains on  RA  O:BP (!) 167/68 (BP Location: Left Arm)   Pulse (!) 101   Temp 98.6 F (37 C) (Oral)   Resp 16   Ht 6\' 1"  (1.854 m)   Wt 110.9 kg   SpO2 93%   BMI 32.26 kg/m   Intake/Output Summary (Last 24 hours) at 05/04/2019 0918 Last data filed at 05/03/2019 1800 Gross per 24 hour  Intake 240 ml  Output -  Net 240 ml   Intake/Output: I/O last 3 completed shifts: In: 480 [P.O.:480] Out: -   Intake/Output this shift:  No intake/output data recorded. Weight change:  Direct physical contact not performed out of concern for covid-19 infection and utilizing exam documented by primary team and RN documentation.   Recent Labs  Lab 04/28/19 0500 04/29/19 0449 04/30/19 1018 05/01/19 0623 05/02/19 0754  NA 136 134* 135 135 136  K 4.3 4.5 4.8 4.4 4.2  CL 96* 93* 94* 96* 97*  CO2 23 22 21* 24 23  GLUCOSE 163* 230* 166* 206* 186*  BUN 101* 56* 77* 47* 66*  CREATININE 11.81* 8.38* 11.09* 7.53* 9.80*  ALBUMIN 2.4* 2.7* 2.4* 2.5* 2.7*  CALCIUM 8.2* 8.4* 8.4* 8.6* 8.8*  PHOS 6.1* 5.4* 5.4* 4.5 4.8*   Liver Function Tests: Recent Labs  Lab 04/30/19 1018 05/01/19 0623 05/02/19 0754  ALBUMIN 2.4* 2.5* 2.7*   No results for input(s): LIPASE, AMYLASE in the last 168 hours. No results for input(s): AMMONIA in the last 168 hours. CBC: Recent Labs  Lab 04/28/19 0754 04/30/19 1018 05/02/19 0756  WBC 13.4* 12.3* 11.1*  HGB 10.6* 11.5* 10.8*  HCT 32.3* 34.9* 33.6*  MCV 96.7 94.6 96.6  PLT 375 293 310   Cardiac Enzymes: No results for input(s): CKTOTAL, CKMB, CKMBINDEX, TROPONINI in the last 168 hours. CBG: Recent Labs  Lab 05/03/19 0756 05/03/19 1137 05/03/19 1629 05/03/19 2021 05/04/19 0832  GLUCAP 258* 162* 127* 113* 165*    Iron Studies:  Recent Labs    05/03/19 0603  FERRITIN 1,306*   Studies/Results: No results found. [name]  atorvastatin  40 mg Oral QHS  . benzonatate  100 mg Oral TID  . calcium acetate  667 mg Oral TID WC  . Chlorhexidine Gluconate Cloth  6 each Topical Q0600  . Chlorhexidine Gluconate Cloth  6 each Topical Q0600  . clopidogrel  75 mg Oral Daily  . dorzolamide-timolol  1 drop Both Eyes BID  . famotidine  20 mg Oral Daily  . heparin  7,500 Units Subcutaneous Q8H  . insulin aspart  0-15 Units Subcutaneous TID WC  . insulin aspart  8 Units Subcutaneous TID WC  . insulin detemir  15 Units Subcutaneous QHS  . Ipratropium-Albuterol  1 puff Inhalation TID  . pantoprazole  40 mg Oral BID  . sodium chloride flush  3 mL Intravenous Q12H  . vitamin C  500 mg Oral Daily  . zinc sulfate  220 mg Oral Daily    BMET    Component Value Date/Time   NA 136 05/02/2019 0754   K 4.2 05/02/2019 0754   CL 97 (L) 05/02/2019 0754   CO2 23 05/02/2019 0754   GLUCOSE 186 (H) 05/02/2019 0754   BUN 66 (H) 05/02/2019 0754   CREATININE 9.80 (H) 05/02/2019 0754   CALCIUM 8.8 (L) 05/02/2019 0754   GFRNONAA 5 (  L) 05/02/2019 0754   GFRAA 5 (L) 05/02/2019 0754   CBC    Component Value Date/Time   WBC 11.1 (H) 05/02/2019 0756   RBC 3.48 (L) 05/02/2019 0756   HGB 10.8 (L) 05/02/2019 0756   HGB 11.9 (L) 04/12/2019 1513   HCT 33.6 (L) 05/02/2019 0756   PLT 310 05/02/2019 0756   MCV 96.6 05/02/2019 0756   MCH 31.0 05/02/2019 0756   MCHC 32.1 05/02/2019 0756   RDW 14.4 05/02/2019 0756   LYMPHSABS 1.0 04/17/2019 0409   MONOABS 0.5 04/17/2019 0409   EOSABS 0.0 04/17/2019 0409   BASOSABS 0.0 04/17/2019 0409     [facility] [place] [phone]  MWF, BFR400 DFR600 AVF 4 hours EDW 116kg2K/3Ca  Assessment/Plan: 1. covid-19 pneumonia-  Repeat swab neg from  6/3- recheck today  2. ESRD- cont with HD qMWF via AVF- cont on schedule. He is much below edw and BP's stable. Continue to challenge edw to help with hypoxia-  only on low dose coreg.  Now it seems that will be placed in a SNF that will  require a change in dialysis facilities ?  So does not have solid OP HD spot at this time  3. Diarrhea and abdominal pain- workup per primary svc 4. Anemia of ESRD- not on  ESA at present- hgb over 10- no meds 5. SHPTH- stable- no binder listed on home med list- phos was starting to creep up- added phoslo -  PTH 337- no meds for now 6. Second degree heart block- per primary 7. Dispo-  Awaiting SNF placement - - can run as covid positive at [facility] on TTS but possibly not able to be placed until negative - all to be determined    Louis Meckel

## 2019-05-05 LAB — CBC
HCT: 31.8 % — ABNORMAL LOW (ref 39.0–52.0)
Hemoglobin: 10.2 g/dL — ABNORMAL LOW (ref 13.0–17.0)
MCH: 31.4 pg (ref 26.0–34.0)
MCHC: 32.1 g/dL (ref 30.0–36.0)
MCV: 97.8 fL (ref 80.0–100.0)
Platelets: 243 10*3/uL (ref 150–400)
RBC: 3.25 MIL/uL — ABNORMAL LOW (ref 4.22–5.81)
RDW: 14.5 % (ref 11.5–15.5)
WBC: 10.5 10*3/uL (ref 4.0–10.5)
nRBC: 0 % (ref 0.0–0.2)

## 2019-05-05 LAB — RENAL FUNCTION PANEL
Albumin: 2.4 g/dL — ABNORMAL LOW (ref 3.5–5.0)
Anion gap: 15 (ref 5–15)
BUN: 68 mg/dL — ABNORMAL HIGH (ref 8–23)
CO2: 23 mmol/L (ref 22–32)
Calcium: 8.5 mg/dL — ABNORMAL LOW (ref 8.9–10.3)
Chloride: 99 mmol/L (ref 98–111)
Creatinine, Ser: 9.57 mg/dL — ABNORMAL HIGH (ref 0.61–1.24)
GFR calc Af Amer: 5 mL/min — ABNORMAL LOW (ref >60)
GFR calc non Af Amer: 5 mL/min — ABNORMAL LOW (ref >60)
Glucose, Bld: 243 mg/dL — ABNORMAL HIGH (ref 70–99)
Phosphorus: 3.2 mg/dL (ref 2.5–4.6)
Potassium: 4.6 mmol/L (ref 3.5–5.1)
Sodium: 137 mmol/L (ref 135–145)

## 2019-05-05 LAB — D-DIMER, QUANTITATIVE: D-Dimer, Quant: 0.91 ug/mL-FEU — ABNORMAL HIGH (ref 0.00–0.50)

## 2019-05-05 LAB — GLUCOSE, CAPILLARY
Glucose-Capillary: 124 mg/dL — ABNORMAL HIGH (ref 70–99)
Glucose-Capillary: 180 mg/dL — ABNORMAL HIGH (ref 70–99)
Glucose-Capillary: 115 mg/dL — ABNORMAL HIGH (ref 70–99)

## 2019-05-05 LAB — C-REACTIVE PROTEIN: CRP: 0.9 mg/dL (ref <1.0)

## 2019-05-05 LAB — FERRITIN: Ferritin: 1374 ng/mL — ABNORMAL HIGH (ref 24–336)

## 2019-05-05 NOTE — Progress Notes (Signed)
Renal Navigator spoke with Angie/Charge RN at [facility] ([place] + shift in [place]) who initially reported to Renal Navigator that the closest COVID + [place] shift to [facility] in [place] would be in [place], and therefore, Renal Navigator was not going to pursue arranging this for patient.  Renal Navigator working on transfer from [facility] to [facility] clinic when [facility] called back to say that [facility] also has COVID + shift. Renal Navigator left message for this clinic to inquire about what time this shift is. Renal Navigator spoke with Barrister's clerk at [facility]) to see if taking patient to any other clinic than [facility] (where other COVID + residents are dialyzing) is even an option. Renal Navigator awaiting calls back from [facility] clinic and SNF [facility]. Renal Navigator following closely.  Alphonzo Cruise, [place] Renal Navigator [phone]

## 2019-05-05 NOTE — Progress Notes (Signed)
[facility] Progress Note    Assessment/ Plan:   [facility] [place] [phone]  MWF, BFR400 MVE720 AVF 4 hours EDW 116kg2K/3Ca  Assessment/Plan: 1. covid-19 pneumonia- Repeatswabneg from  6/3. 6/6 send out was positive. 2. ESRD- cont with HD qMWF via AVF- cont on schedule. He ismuch below edw and BP's stable. Continue to challenge edw to help with hypoxia-  only on low dose coreg.  Now it seems that will be placed in a SNF that will require a change in dialysis facilities ?  So does not have solid OP HD spot at this time; still waiting for placement ([place]) Tolerated HD today.  3. Diarrhea and abdominal pain- workup per primary svc 4. Anemia of ESRD- not on  ESA at present- hgb over 10- no meds 5. SHPTH- stable- no binder listed on home med list- phos was starting to creep up- added phoslo -  PTH 337- no meds for now 6. Second degree heart block- per primary 7. Dispo-  Awaiting SNF placement - - can run as covid positive at [facility] on TTS but possibly not able to be placed until negative - all to be determined   Subjective:   Resting comfortably. Tolerated HD this am.   Objective:   BP 136/76 (BP Location: Left Arm)   Pulse 91   Temp 98.4 F (36.9 C) (Oral)   Resp 20   Ht 6\' 1"  (1.854 m)   Wt 110.9 kg   SpO2 96%   BMI 32.26 kg/m   Intake/Output Summary (Last 24 hours) at 05/05/2019 1754 Last data filed at 05/05/2019 1604 Gross per 24 hour  Intake 240 ml  Output 1692 ml  Net -1452 ml   Weight change:   Physical Exam: Direct physical contact not performed out of concern for covid-19 infection and utilizing exam documented by primary team and RN documentation.    Imaging: No results found.  Labs: BMET Recent Labs  Lab 04/29/19 0449 04/30/19 1018 05/01/19 0623 05/02/19 0754 05/05/19 1416  NA 134* 135 135 136 137  K 4.5 4.8 4.4 4.2 4.6  CL 93* 94* 96* 97* 99  CO2 22 21* 24 23 23   GLUCOSE 230* 166* 206*  186* 243*  BUN 56* 77* 47* 66* 68*  CREATININE 8.38* 11.09* 7.53* 9.80* 9.57*  CALCIUM 8.4* 8.4* 8.6* 8.8* 8.5*  PHOS 5.4* 5.4* 4.5 4.8* 3.2   CBC Recent Labs  Lab 04/30/19 1018 05/02/19 0756 05/05/19 1416  WBC 12.3* 11.1* 10.5  HGB 11.5* 10.8* 10.2*  HCT 34.9* 33.6* 31.8*  MCV 94.6 96.6 97.8  PLT 293 310 243    Medications:    . atorvastatin  40 mg Oral QHS  . benzonatate  100 mg Oral TID  . calcium acetate  667 mg Oral TID WC  . Chlorhexidine Gluconate Cloth  6 each Topical Q0600  . clopidogrel  75 mg Oral Daily  . dorzolamide-timolol  1 drop Both Eyes BID  . famotidine  20 mg Oral Daily  . heparin  7,500 Units Subcutaneous Q8H  . insulin aspart  0-15 Units Subcutaneous TID WC  . insulin aspart  8 Units Subcutaneous TID WC  . insulin detemir  15 Units Subcutaneous QHS  . Ipratropium-Albuterol  1 puff Inhalation TID  . pantoprazole  40 mg Oral BID  . sodium chloride flush  3 mL Intravenous Q12H  . vitamin C  500 mg Oral Daily  . zinc sulfate  220 mg Oral Daily  Otelia Santee, MD 05/05/2019, 5:54 PM

## 2019-05-05 NOTE — Progress Notes (Signed)
PROGRESS NOTE    Cory Miller  AQT:[id number] DOB: 1941/06/06 DOA: 04/12/2019 PCP: [name], MD  Brief Narrative: 78 y.o.malewith medical history significant ofHTN, HLD, CADs/pCABG, ESRD 23on HD(M/W/F), DM type II, CVA, GERD, and hard of hearing; who [facility] with complaints of fever, chills, cough, and shortness of breath approximately 2 weeks. Reports having fevers up to 101 F at home. Presented to [facility] ER, chest x-ray noted basilar congestion without any focal airspace opacity. COVID-19 testing was positive [place] on 5/15, transferred to [facility], due to need for hemodialysis. 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 ([place])   HTN (hypertension)   End stage renal disease ([place])   Hypokalemia   Anemia of chronic disease   Palliative care by specialist   Goals of care, counseling/discussion   Advanced care planning/counseling discussion   Acute COVID-19 Viral illness,  -Clinically improving with supportive care -Hypoxia improving slowly, initially required 4 L O2NCnow down to 1 L -Treated with IV steroids x5 days earlier this hospital stay which was completed on 5/28 -Remdesivir contraindicated in ESRD -Actemra deferred for now, if clinically worsens this would be a consideration -Inflammatory markers improving -Clinically remains stable, hypoxia improving -Attempt to wean off O2, currently down to 1 L -Increase activity, ambulation, physical therapy, incentive spirometer -Repeat COVID from 6/1positive and repeat covid 6/6 positiveonce we have a negative covidtest patient can go to SNF.  ESRD on HD -Continue renal diet -Nephrology following  Possible UTI/asymptomatic bacteriuria UA  urine positive for large leukocytes with >50 WBCs. UCx: Negative. Completed antibiotics.  Essential hypertension Blood pressure seen to be elevated up to 191/71 initially on presentation Have been on Norvasc, Coreg, clonidine, torsemide and hydralazine. Was put backon coreg only Now bp soft will dc coreg  Diabetes mellitus type 2 uncontrolled with hyperglycemia with renal complication -Hemoglobin A1c 7.8 -Stable, continue Levemir 15 units and NovoLog with meals  Anemia of chronic disease Hemoglobin appears stable Continue to monitor  History of CVA -Plavix and statin  Deconditioning PT/OT consultedrecommends SNF.  Indigestion. Continue Pepcid, Maalox.  DVT prophylaxis:heparin Code Status:FULL Disposition Plan:needs snf placement with dialysis capacity , however, repeat covid testing on 5/28 remains positive,repeat covid 6/1 and 6/6 positive Family communication dw son Engineer, structural   Consultants:  Nephrology Palliative care   Antimicrobials:  Rocephin  Estimated body mass index is 32.26 kg/m as calculated from the following:   Height as of this encounter: 6\' 1"  (1.854 m).   Weight as of this encounter: 110.9 kg.      Subjective:  Resting in bed in no acute distress no new complaints hard of hearing Objective: Vitals:   05/03/19 2103 05/04/19 0900 05/04/19 2000 05/05/19 0825  BP: (!) 167/68 (!) 152/67 (!) 149/65 (!) 188/58  Pulse: (!) 101 98 (!) 105 (!) 48  Resp: 16   16  Temp: 98.6 F (37 C) 98.4 F (36.9 C) 98.6 F (37 C) 98.2 F (36.8 C)  TempSrc: Oral Oral Oral Oral  SpO2: 93% 94% 94% 96%  Weight:      Height:        Intake/Output Summary (Last 24 hours) at 05/05/2019 1314 Last data filed at 05/04/2019 1800 Gross per 24 hour  Intake 600 ml  Output -  Net 600 ml  Filed Weights   05/02/19 0725 05/02/19 1112 05/03/19 0500  Weight: 108.1 kg 107 kg 110.9 kg    Examination:  General exam: Appears calm and comfortable   Respiratory system: Clear to auscultation. Respiratory effort normal. Cardiovascular system: S1 & S2 heard, RRR. No JVD, murmurs, rubs, gallops or clicks. No pedal edema. Gastrointestinal system: Abdomen is nondistended, soft and nontender. No organomegaly or masses felt. Normal bowel sounds heard. Central nervous system: Alert and oriented. No focal neurological deficits. Extremities: Symmetric 5 x 5 power. Skin: No rashes, lesions or ulcers Psychiatry: Judgement and insight appear normal. Mood & affect appropriate.     Data Reviewed: I have personally reviewed following labs and imaging studies  CBC: Recent Labs  Lab 04/30/19 1018 05/02/19 0756  WBC 12.3* 11.1*  HGB 11.5* 10.8*  HCT 34.9* 33.6*  MCV 94.6 96.6  PLT 293 161   Basic Metabolic Panel: Recent Labs  Lab 04/29/19 0449 04/30/19 1018 05/01/19 0623 05/02/19 0754  NA 134* 135 135 136  K 4.5 4.8 4.4 4.2  CL 93* 94* 96* 97*  CO2 22 21* 24 23  GLUCOSE 230* 166* 206* 186*  BUN 56* 77* 47* 66*  CREATININE 8.38* 11.09* 7.53* 9.80*  CALCIUM 8.4* 8.4* 8.6* 8.8*  PHOS 5.4* 5.4* 4.5 4.8*   GFR: Estimated Creatinine Clearance: 8.2 mL/min (A) (by C-G formula based on SCr of 9.8 mg/dL (H)). Liver Function Tests: Recent Labs  Lab 04/29/19 0449 04/30/19 1018 05/01/19 0623 05/02/19 0754  ALBUMIN 2.7* 2.4* 2.5* 2.7*   No results for input(s): LIPASE, AMYLASE in the last 168 hours. No results for input(s): AMMONIA in the last 168 hours. Coagulation Profile: No results for input(s): INR, PROTIME in the last 168 hours. Cardiac Enzymes: No results for input(s): CKTOTAL, CKMB, CKMBINDEX, TROPONINI in the last 168 hours. BNP (last 3 results) No results for input(s): PROBNP in the last 8760 hours. HbA1C: No results for input(s): HGBA1C in the last 72 hours. CBG: Recent Labs  Lab 05/04/19 1335 05/04/19 1738 05/04/19 2029 05/05/19 0815 05/05/19 1255  GLUCAP 148* 186* 207* 124* 180*   Lipid Profile: No results for  input(s): CHOL, HDL, LDLCALC, TRIG, CHOLHDL, LDLDIRECT in the last 72 hours. Thyroid Function Tests: No results for input(s): TSH, T4TOTAL, FREET4, T3FREE, THYROIDAB in the last 72 hours. Anemia Panel: Recent Labs    05/03/19 0603 05/04/19 0820  FERRITIN 1,306* 1,280*   Sepsis Labs: No results for input(s): PROCALCITON, LATICACIDVEN in the last 168 hours.  Recent Results (from the past 240 hour(s))  Novel Coronavirus, NAA (hospital order; send-out to ref lab)     Status: None   Collection Time: 04/30/19 12:57 PM  Result Value Ref Range Status   SARS-CoV-2, NAA NOT DETECTED NOT DETECTED Final    Comment: (NOTE) This test was developed and its performance characteristics determined by [employer]. This test has not been FDA cleared or approved. This test has been authorized by FDA under an Emergency Use Authorization (EUA). This test is only authorized for the duration of time the declaration that circumstances exist justifying the authorization of the emergency use of in vitro diagnostic tests for detection of SARS-CoV-2 virus and/or diagnosis of COVID-19 infection under section 564(b)(1) of the Act, 21 U.S.C. 096EAV-4(U)(9), unless the authorization is terminated or revoked sooner. When diagnostic testing is negative, the possibility of a false negative result should be considered in the context of a patient's recent exposures and the presence of clinical signs and symptoms consistent with COVID-19. An  individual without symptoms of COVID-19 and who is not shedding SARS-CoV-2 virus would expect to have a negative (not detected) result in this assay. Performed  At: [facility] [address] [place], [place] [id number] Rush Farmer MD TG:[id number]    Ada  Final    Comment: Performed at [facility], [place] [address], [place], [place] [address]  SARS Coronavirus 2     Status: Abnormal   Collection Time: 05/03/19  6:24 AM   Result Value Ref Range Status   SARS Coronavirus 2 DETECTED (A) NOT DETECTED Final    Comment: CRITICAL RESULT CALLED TO, READ BACK BY AND VERIFIED WITH: RN Alger Simons B. [id number] [id number] FCP (NOTE) SARS-CoV-2 target nucleic acids are DETECTED. The SARS-CoV-2 RNA is generally detectable in upper and lower respiratory specimens during the acute phase of infection. Positive results are indicative of active infection with SARS-CoV-2. Clinical  correlation with patient history and other diagnostic information is necessary to determine patient infection status. Positive results do  not rule out bacterial infection or co-infection with other viruses. The expected result is Not Detected. Fact Sheet for Patients: http://www.biofiredefense.com/wp-content/uploads/2020/03/BIOFIRE-COVID -19-patients.pdf Fact Sheet for Healthcare Providers: http://www.biofiredefense.com/wp-content/uploads/2020/03/BIOFIRE-COVID -19-hcp.pdf This test is not yet approved or cleared by the [place] and  has been authorized for detection and/or diagnosis of SARS-CoV-2 by FDA under an Emergency  Use Authorization (EUA).  This EUA will remain in effect (meaning this test can be used) for the duration of  the COVID-19 declaration under Section 564(b)(1) of the Act, 21 U.S.C. section [phone] 3(b)(1), unless the authorization is terminated or revoked sooner. Performed at [facility], [place] [address] [place], [place] [address]          Radiology Studies: No results found.      Scheduled Meds: . atorvastatin  40 mg Oral QHS  . benzonatate  100 mg Oral TID  . calcium acetate  667 mg Oral TID WC  . Chlorhexidine Gluconate Cloth  6 each Topical Q0600  . clopidogrel  75 mg Oral Daily  . dorzolamide-timolol  1 drop Both Eyes BID  . famotidine  20 mg Oral Daily  . heparin  7,500 Units Subcutaneous Q8H  . insulin aspart  0-15 Units Subcutaneous TID WC  . insulin aspart  8 Units Subcutaneous TID WC  .  insulin detemir  15 Units Subcutaneous QHS  . Ipratropium-Albuterol  1 puff Inhalation TID  . pantoprazole  40 mg Oral BID  . sodium chloride flush  3 mL Intravenous Q12H  . vitamin C  500 mg Oral Daily  . zinc sulfate  220 mg Oral Daily   Continuous Infusions: . sodium chloride    . sodium chloride       LOS: 23 days       Georgette Shell, MD [facility]  If 7PM-7AM, please contact night-coverage [email or web address] Password [email or web address] 05/05/2019, 1:14 PM

## 2019-05-06 LAB — GLUCOSE, CAPILLARY: Glucose-Capillary: 162 mg/dL — ABNORMAL HIGH (ref 70–99)

## 2019-05-06 NOTE — Progress Notes (Signed)
Physical Therapy Treatment Patient Details Name: Cory Miller MRN: [id number] DOB: 16-Mar-1941 Today's Date: 05/06/2019    History of Present Illness Cory Miller is a 78 y.o. male with medical history significant of HTN, HLD, CAD  s/p CABG, ESRD on HD(M/W/F), DM type II, CVA, GERD, and hard of hearing; who presented to [facility] with complaints of fever, chills, cough, and shortness of breath approximately 2 weeks.  Reports having fevers up to 101 F at home. found to be Covid 19 +    PT Comments    Pt admitted with above diagnosis. Pt currently with functional limitations due to balance and endurance deficits. Pt was able to ambulate in room with good safety with RW.  Next visit may need to try without device.  Pt progressing well.  O2 sats >90% with activity as well.   Pt will benefit from skilled PT to increase their independence and safety with mobility to allow discharge to the venue listed below.     Follow Up Recommendations  SNF     Equipment Recommendations  Rolling walker with 5" wheels    Recommendations for Other Services OT consult     Precautions / Restrictions Precautions Precautions: Fall Precaution Comments: Fall risk low (especially with RW), but present Restrictions Weight Bearing Restrictions: No    Mobility  Bed Mobility Overal bed mobility: Needs Assistance Bed Mobility: Supine to Sit;Sit to Supine     Supine to sit: Supervision Sit to supine: Supervision      Transfers Overall transfer level: Needs assistance Equipment used: Rolling walker (2 wheeled) Transfers: Sit to/from Stand Sit to Stand: Supervision         General transfer comment: increased time/effort, no physical assist required; stood from EOB   Ambulation/Gait Ambulation/Gait assistance: Min guard;Supervision Gait Distance (Feet): 225 Feet Assistive device: Rolling walker (2 wheeled) Gait Pattern/deviations: Step-through pattern;Decreased weight shift to  left;Decreased stance time - left;Antalgic Gait velocity: decr Gait velocity interpretation: 1.31 - 2.62 ft/sec, indicative of limited community ambulator General Gait Details: Ambulated with RW with good stability. No LOB.  Good cadence as well.  No SOB.  Stopped by bathroom and pt used bathroom as well.  Amb on RA with SpO2 >90%    Stairs             Wheelchair Mobility    Modified Rankin (Stroke Patients Only)       Balance Overall balance assessment: Needs assistance Sitting-balance support: No upper extremity supported;Feet supported Sitting balance-Leahy Scale: Good     Standing balance support: Bilateral upper extremity supported;During functional activity Standing balance-Leahy Scale: Fair Standing balance comment: able to maintain static standing during washing hands at sink and combing hair                            Cognition Arousal/Alertness: Awake/alert Behavior During Therapy: WFL for tasks assessed/performed Overall Cognitive Status: Difficult to assess                                 General Comments: pt very hard of hearing so difficult to follow directions; requires cues for safety during session      Exercises General Exercises - Lower Extremity Long Arc Quad: AROM;Both;10 reps;Seated Hip Flexion/Marching: AROM;Both;10 reps;Seated;Standing Mini-Sqauts: AROM;Both;10 reps;Standing    General Comments        Pertinent Vitals/Pain Pain Assessment: No/denies pain  Home Living                      Prior Function            PT Goals (current goals can now be found in the care plan section) Acute Rehab PT Goals Patient Stated Goal: none stated, agreeable to therapy session Progress towards PT goals: Progressing toward goals    Frequency    Min 3X/week      PT Plan Current plan remains appropriate    Co-evaluation              AM-PAC PT "6 Clicks" Mobility   Outcome Measure  Help  needed turning from your back to your side while in a flat bed without using bedrails?: A Little Help needed moving from lying on your back to sitting on the side of a flat bed without using bedrails?: A Little Help needed moving to and from a bed to a chair (including a wheelchair)?: A Little Help needed standing up from a chair using your arms (e.g., wheelchair or bedside chair)?: A Little Help needed to walk in hospital room?: A Little Help needed climbing 3-5 steps with a railing? : A Lot 6 Click Score: 17    End of Session Equipment Utilized During Treatment: Gait belt;Oxygen Activity Tolerance: Patient limited by fatigue Patient left: with bed alarm set;in bed;with call bell/phone within reach Nurse Communication: Mobility status PT Visit Diagnosis: Difficulty in walking, not elsewhere classified (R26.2);Other (comment);Pain Pain - Right/Left: Left Pain - part of body: Ankle and joints of foot     Time: 3567-0141 PT Time Calculation (min) (ACUTE ONLY): 29 min  Charges:  $Gait Training: 8-22 mins $Therapeutic Exercise: 8-22 mins                     [place] Pager:  [phone]  Office:  [place] 05/06/2019, 1:49 PM

## 2019-05-06 NOTE — Progress Notes (Signed)
Spoke with patients son on the phone. Updated on patient condition.

## 2019-05-06 NOTE — Progress Notes (Signed)
  [facility] Progress Note    Assessment/ Plan:   [facility] [phone]  MWF, [id number] [id number] AVF 4 hours EDW 116kg2K/3Ca  Assessment/Plan: 1. covid-19 pneumonia-Repeatswabneg from 6/3. 6/6 send out was positive. Repeated 6/9. 2. ESRD- cont with HD qMWFvia AVF- cont on schedule. He ismuch below edw and BP's stable. Continue to challenge edw to help with hypoxia- only on low dose coreg. Now it seems that will be placed in a SNF that will require a change in dialysis facilities ? So does not have solid OP HD spot at this time; still waiting for placement ([place] for HD tomorrow. All hepatitis ab tests requested have been ordered.  3. Diarrhea and abdominal pain- workup per primary svc 4. 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 SHPTH- stable- no binder listed on home med list- phos was starting to creep up- added phoslo - PTH 337- no meds for now 6. Second degree heart block- per primary 7. Dispo- Awaiting SNF placement - - can run as covid positive at [facility] on TTS but possibly not able to be placed until negative - all to be determined   Subjective:   Denies f/c/n/v. Tolerating PO's   Objective:   BP (!) 146/57   Pulse 94   Temp 98.4 F (36.9 C) (Oral)   Resp 18   Ht 6\' 1"  (1.854 m)   Wt 110.9 kg   SpO2 92%   BMI 32.26 kg/m   Intake/Output Summary (Last 24 hours) at 05/06/2019 1536 Last data filed at 05/05/2019 1604 Gross per 24 hour  Intake -  Output 1692 ml  Net -1692 ml   Weight change:   Physical Exam: Direct physical contact not performed out of concern for covid-19 infection and utilizing exam documented by primary team and RN documentation.  Imaging: No results found.  Labs: BMET Recent Labs  Lab 04/30/19 1018 05/01/19 0623 05/02/19 0754 05/05/19 1416  NA 135 135 136 137  K 4.8 4.4 4.2 4.6  CL 94* 96* 97* 99  CO2 21* 24 23 23   GLUCOSE  166* 206* 186* 243*  BUN 77* 47* 66* 68*  CREATININE 11.09* 7.53* 9.80* 9.57*  CALCIUM 8.4* 8.6* 8.8* 8.5*  PHOS 5.4* 4.5 4.8* 3.2   CBC Recent Labs  Lab 04/30/19 1018 05/02/19 0756 05/05/19 1416  WBC 12.3* 11.1* 10.5  HGB 11.5* 10.8* 10.2*  HCT 34.9* 33.6* 31.8*  MCV 94.6 96.6 97.8  PLT 293 310 243    Medications:    . atorvastatin  40 mg Oral QHS  . benzonatate  100 mg Oral TID  . calcium acetate  667 mg Oral TID WC  . Chlorhexidine Gluconate Cloth  6 each Topical Q0600  . clopidogrel  75 mg Oral Daily  . dorzolamide-timolol  1 drop Both Eyes BID  . famotidine  20 mg Oral Daily  . heparin  7,500 Units Subcutaneous Q8H  . insulin aspart  0-15 Units Subcutaneous TID WC  . insulin aspart  8 Units Subcutaneous TID WC  . insulin detemir  15 Units Subcutaneous QHS  . pantoprazole  40 mg Oral BID  . sodium chloride flush  3 mL Intravenous Q12H  . vitamin C  500 mg Oral Daily  . zinc sulfate  220 mg Oral Daily      Otelia Santee, MD 05/06/2019, 3:36 PM

## 2019-05-06 NOTE — Progress Notes (Signed)
Renal Navigator received call back from [facility] clinic informing Renal Navigator that the transfer to [facility] clinic needs to be initiated through Renal Navigator.  Renal Navigator has completed referral through [facility] and has requested that Nephrologist/Dr. Augustin Coupe order HepB surface antibody and core antibody labs as these are needed for referral. Renal Navigator will add these labs to referral once they are resulted.  Renal Navigator updated CSW.  Alphonzo Cruise, [place] Renal Navigator [phone]

## 2019-05-06 NOTE — Progress Notes (Signed)
Renal Navigator has determined that patient can receive OP HD treatment at [facility] at [facility] on a TTS schedule at 12:30pm. Renal Navigator spoke with Levander Campion at this center to confirm shift schedule. Renal Navigator has confirmed that [facility] will provide transportation to this facility and therefore patient does not have to transfer dialysis companies.  At this time, Renal Navigator has spoken with patient's home clinic/[facility] [place] to initiate transfer to [place] + shift at [facility] and they will be sending patient's medical records. Renal Navigator has asked that records be sent today, as patient is ready for discharge to SNF per CSW. Renal Navigator has also asked to be contacted if any records are needed from hospital, as Renal Navigator is available to send these records.  Renal Navigator updated CSW and Nephrologist.  Alphonzo Cruise, [place] Renal Navigator [phone]

## 2019-05-06 NOTE — Progress Notes (Addendum)
PROGRESS NOTE    Cory Miller  MAU:[id number] DOB: [birth year] DOA: 04/12/2019 PCP: [name], MD  Brief Narrative:78 y.o.malewith medical history significant ofHTN, HLD, CADs/pCABG, ESRD 23on HD(M/W/F), DM type II, CVA, GERD, and hard of hearing; who [facility] with complaints of fever, chills, cough, and shortness of breath approximately 2 weeks. Reports having fevers up to 101 F at home. Presented to [facility] ER, chest x-ray noted basilar congestion without any focal airspace opacity. COVID-19 testing was positive [place] on 5/15, transferred to [facility], due to need for hemodialysis. 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 ([place])   HTN (hypertension)   End stage renal disease ([place])   Hypokalemia   Anemia of chronic disease   Palliative care by specialist   Goals of care, counseling/discussion   Advanced care planning/counseling discussion  Acute COVID-19 Viral illness,  -Clinically improving with supportive care -Hypoxia improving slowly, initially required 4 L O2NCnow down to 1 L -Treated with IV steroids x5 days earlier this hospital stay which was completed on 5/28 -Remdesivir contraindicated in ESRD -Actemra deferred for now, if clinically worsens this would be a consideration -Inflammatory markers improving -Clinically remains stable, hypoxia improving -Attempt to wean off O2, currently down to 1 L -Increase activity, ambulation, physical therapy, incentive spirometer -Repeat COVID from 6/1positive and repeat covid 6/6 positiveonce we have a negative covidtest patient can go to SNF.  -Repeat covid done 05/06/2019  ESRD on HD -Continue renal diet -Nephrology following  Possible  UTI/asymptomatic bacteriuria UA urine positive for large leukocytes with >50 WBCs. UCx: Negative. Completed antibiotics.  Essential hypertension his blood pressure has been up and down during the hospital stay he was taking Norvasc Coreg clonidine torsemide and hydralazine prior to admission to hospital.  Will restart Coreg due to uptrending blood pressure and heart rate.  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  Indigestion. Continue Pepcid, Maalox.  DVT prophylaxis:heparin Code Status:FULL Disposition Plan:needs snf placement with dialysis capacity , however, repeat covid testing on 5/28 remains positive,repeat covid 6/1 and 6/6 positive. Family communication dw son Engineer, structural  Consultants:  Nephrology Palliative care   Antimicrobials:  Rocephin  Estimated body mass index is 32.26 kg/m as calculated from the following:   Height as of this encounter: 6\' 1"  (1.854 m).   Weight as of this encounter: 110.9 kg.  Subjective:  Resting in bed no complaints very hard of hearing on room air 92% Objective: Vitals:   05/05/19 1613 05/05/19 2055 05/05/19 2100 05/06/19 0610  BP: 136/76  (!) 148/82 (!) 146/57  Pulse: 91 92 92 94  Resp: 20 20 20 18   Temp: 98.4 F (36.9 C)  98.7 F (37.1 C) 98.4 F (36.9 C)  TempSrc: Oral  Oral Oral  SpO2:   96% 92%  Weight:      Height:        Intake/Output Summary (Last 24 hours) at 05/06/2019 1316 Last data filed at 05/05/2019 1604 Gross per 24 hour  Intake -  Output 1692 ml  Net -1692 ml   [employer]  05/02/19 0725 05/02/19 1112 05/03/19 0500  Weight: 108.1 kg 107 kg 110.9 kg    Examination:  General exam: Appears calm  and comfortable  Respiratory system: Clear to auscultation. Respiratory effort normal. Cardiovascular system: S1 & S2 heard, RRR. No JVD, murmurs, rubs, gallops or clicks. No pedal edema. Gastrointestinal system: Abdomen is nondistended, soft and nontender. No organomegaly or masses felt. Normal bowel sounds heard. Central nervous system: Alert and oriented. No focal neurological deficits. Extremities: Symmetric 5 x 5 power. Skin: No rashes, lesions or ulcers Psychiatry: Judgement and insight appear normal. Mood & affect appropriate.     Data Reviewed: I have personally reviewed following labs and imaging studies  CBC: Recent Labs  Lab 04/30/19 1018 05/02/19 0756 05/05/19 1416  WBC 12.3* 11.1* 10.5  HGB 11.5* 10.8* 10.2*  HCT 34.9* 33.6* 31.8*  MCV 94.6 96.6 97.8  PLT 293 310 702   Basic Metabolic Panel: Recent Labs  Lab 04/30/19 1018 05/01/19 0623 05/02/19 0754 05/05/19 1416  NA 135 135 136 137  K 4.8 4.4 4.2 4.6  CL 94* 96* 97* 99  CO2 21* 24 23 23   GLUCOSE 166* 206* 186* 243*  BUN 77* 47* 66* 68*  CREATININE 11.09* 7.53* 9.80* 9.57*  CALCIUM 8.4* 8.6* 8.8* 8.5*  PHOS 5.4* 4.5 4.8* 3.2   GFR: Estimated Creatinine Clearance: 8.4 mL/min (A) (by C-G formula based on SCr of 9.57 mg/dL (H)). Liver Function Tests: Recent Labs  Lab 04/30/19 1018 05/01/19 0623 05/02/19 0754 05/05/19 1416  ALBUMIN 2.4* 2.5* 2.7* 2.4*   No results for input(s): LIPASE, AMYLASE in the last 168 hours. No results for input(s): AMMONIA in the last 168 hours. Coagulation Profile: No results for input(s): INR, PROTIME in the last 168 hours. Cardiac Enzymes: No results for input(s): CKTOTAL, CKMB, CKMBINDEX, TROPONINI in the last 168 hours. BNP (last 3 results) No results for input(s): PROBNP in the last 8760 hours. HbA1C: No results for input(s): HGBA1C in the last 72 hours. CBG: Recent Labs  Lab 05/04/19 1738 05/04/19 2029 05/05/19 0815 05/05/19 1255 05/05/19 1727  GLUCAP  186* 207* 124* 180* 115*   Lipid Profile: No results for input(s): CHOL, HDL, LDLCALC, TRIG, CHOLHDL, LDLDIRECT in the last 72 hours. Thyroid Function Tests: No results for input(s): TSH, T4TOTAL, FREET4, T3FREE, THYROIDAB in the last 72 hours. Anemia Panel: Recent Labs    05/04/19 0820 05/05/19 0930  FERRITIN 1,280* 1,374*   Sepsis Labs: No results for input(s): PROCALCITON, LATICACIDVEN in the last 168 hours.  Recent Results (from the past 240 hour(s))  Novel Coronavirus, NAA (hospital order; send-out to ref lab)     Status: None   Collection Time: 04/30/19 12:57 PM  Result Value Ref Range Status   SARS-CoV-2, NAA NOT DETECTED NOT DETECTED Final    Comment: (NOTE) This test was developed and its performance characteristics determined by [employer]. This test has not been FDA cleared or approved. This test has been authorized by FDA under an Emergency Use Authorization (EUA). This test is only authorized for the duration of time the declaration that circumstances exist justifying the authorization of the emergency use of in vitro diagnostic tests for detection of SARS-CoV-2 virus and/or diagnosis of COVID-19 infection under section 564(b)(1) of the Act, 21 U.S.C. 637CHY-8(F)(0), unless the authorization is terminated or revoked sooner. When diagnostic testing is negative, the possibility of a false negative result should be considered in the context of a patient's recent exposures and the presence of clinical signs and symptoms consistent  with COVID-19. An individual without symptoms of COVID-19 and who is not shedding SARS-CoV-2 virus would expect to have a negative (not detected) result in this assay. Performed  At: [facility] [address] [place], [place] [id number] Rush Farmer MD JQ:[id number]    [place]  Final    Comment: Performed at [facility], [place] [address], [place], [place] [address]  SARS Coronavirus 2      Status: Abnormal   Collection Time: 05/03/19  6:24 AM  Result Value Ref Range Status   SARS Coronavirus 2 DETECTED (A) NOT DETECTED Final    Comment: CRITICAL RESULT CALLED TO, READ BACK BY AND VERIFIED WITH: RN Alger Simons B. 0740 [id number] FCP (NOTE) SARS-CoV-2 target nucleic acids are DETECTED. The SARS-CoV-2 RNA is generally detectable in upper and lower respiratory specimens during the acute phase of infection. Positive results are indicative of active infection with SARS-CoV-2. Clinical  correlation with patient history and other diagnostic information is necessary to determine patient infection status. Positive results do  not rule out bacterial infection or co-infection with other viruses. The expected result is Not Detected. Fact Sheet for Patients: http://www.biofiredefense.com/wp-content/uploads/2020/03/BIOFIRE-COVID -19-patients.pdf Fact Sheet for Healthcare Providers: http://www.biofiredefense.com/wp-content/uploads/2020/03/BIOFIRE-COVID -19-hcp.pdf This test is not yet approved or cleared by the [place] and  has been authorized for detection and/or diagnosis of SARS-CoV-2 by FDA under an Emergency  Use Authorization (EUA).  This EUA will remain in effect (meaning this test can be used) for the duration of  the COVID-19 declaration under Section 564(b)(1) of the Act, 21 U.S.C. section [phone] 3(b)(1), unless the authorization is terminated or revoked sooner. Performed at [facility], [place] [address] [place], [place] [address]          Radiology Studies: No results found.      Scheduled Meds: . atorvastatin  40 mg Oral QHS  . benzonatate  100 mg Oral TID  . calcium acetate  667 mg Oral TID WC  . Chlorhexidine Gluconate Cloth  6 each Topical Q0600  . clopidogrel  75 mg Oral Daily  . dorzolamide-timolol  1 drop Both Eyes BID  . famotidine  20 mg Oral Daily  . heparin  7,500 Units Subcutaneous Q8H  . insulin aspart  0-15 Units Subcutaneous TID  WC  . insulin aspart  8 Units Subcutaneous TID WC  . insulin detemir  15 Units Subcutaneous QHS  . pantoprazole  40 mg Oral BID  . sodium chloride flush  3 mL Intravenous Q12H  . vitamin C  500 mg Oral Daily  . zinc sulfate  220 mg Oral Daily   Continuous Infusions: . sodium chloride    . sodium chloride       LOS: 24 days     Georgette Shell, MD [facility]  If 7PM-7AM, please contact night-coverage [email or web address] Password [email or web address] 05/06/2019, 1:16 PM

## 2019-05-06 NOTE — Plan of Care (Signed)
Progressing on all goals

## 2019-05-07 LAB — CBC
HCT: 29.9 % — ABNORMAL LOW (ref 39.0–52.0)
Hemoglobin: 9.8 g/dL — ABNORMAL LOW (ref 13.0–17.0)
MCH: 31.5 pg (ref 26.0–34.0)
MCHC: 32.8 g/dL (ref 30.0–36.0)
MCV: 96.1 fL (ref 80.0–100.0)
Platelets: 190 10*3/uL (ref 150–400)
RBC: 3.11 MIL/uL — ABNORMAL LOW (ref 4.22–5.81)
RDW: 14.6 % (ref 11.5–15.5)
WBC: 8.4 10*3/uL (ref 4.0–10.5)
nRBC: 0 % (ref 0.0–0.2)

## 2019-05-07 LAB — D-DIMER, QUANTITATIVE: D-Dimer, Quant: 0.94 ug/mL-FEU — ABNORMAL HIGH (ref 0.00–0.50)

## 2019-05-07 LAB — RENAL FUNCTION PANEL
Albumin: 2.6 g/dL — ABNORMAL LOW (ref 3.5–5.0)
Anion gap: 15 (ref 5–15)
BUN: 57 mg/dL — ABNORMAL HIGH (ref 8–23)
CO2: 25 mmol/L (ref 22–32)
Calcium: 8.8 mg/dL — ABNORMAL LOW (ref 8.9–10.3)
Chloride: 96 mmol/L — ABNORMAL LOW (ref 98–111)
Creatinine, Ser: 8.26 mg/dL — ABNORMAL HIGH (ref 0.61–1.24)
GFR calc Af Amer: 7 mL/min — ABNORMAL LOW (ref >60)
GFR calc non Af Amer: 6 mL/min — ABNORMAL LOW (ref >60)
Glucose, Bld: 162 mg/dL — ABNORMAL HIGH (ref 70–99)
Phosphorus: 4 mg/dL (ref 2.5–4.6)
Potassium: 4.3 mmol/L (ref 3.5–5.1)
Sodium: 136 mmol/L (ref 135–145)

## 2019-05-07 LAB — HEPATITIS B SURFACE ANTIBODY, QUANTITATIVE: Hepatitis B-Post: 5.1 m[IU]/mL — ABNORMAL LOW (ref >9.9)

## 2019-05-07 LAB — C-REACTIVE PROTEIN: CRP: <0.8 mg/dL (ref <1.0)

## 2019-05-07 LAB — HEPATITIS B CORE ANTIBODY, TOTAL: Hep B Core Total Ab: NEGATIVE

## 2019-05-07 LAB — FERRITIN: Ferritin: 1258 ng/mL — ABNORMAL HIGH (ref 24–336)

## 2019-05-07 MED ORDER — HEPARIN SODIUM (PORCINE) 1000 UNIT/ML IJ SOLN
INTRAMUSCULAR | Status: AC
  Administered 2019-05-07: 2400 [IU] via INTRAVENOUS_CENTRAL
  Filled 2019-05-07: qty 3

## 2019-05-07 NOTE — Progress Notes (Signed)
PROGRESS NOTE  Cory Miller OZD:[id number] DOB: December 16, 1940 DOA: 04/12/2019 PCP: [name], MD  HPI/Recap of past 24 hours: Brief Narrative:78 y.o.malewith medical history significant ofHTN, HLD, CADs/pCABG, ESRD 23on HD(M/W/F), DM type II, CVA, GERD, and hard of hearing; who [facility] with complaints of fever, chills, cough, and shortness of breath approximately 2 weeks. Reports having fevers up to 101 F at home. Presented to [facility] ER, chest x-ray noted basilar congestion without any focal airspace opacity. COVID-19 testing was positive [place] on 5/15, transferred to [facility], due to need for hemodialysis. -With supportive care oxygen and IV steroids initially -Followed by nephrology, continued hemodialysis  05/07/19: Patient seen and examined at his bedside.  Reports persistent cough.  Hemodialysis planned today.  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)   HTN (hypertension)   End stage renal disease ([facility])   Hypokalemia   Anemia of chronic disease   Palliative care by specialist   Goals of care, counseling/discussion   Advanced care planning/counseling discussion  Acute COVID-19 Viral illness,  -Transferred [facility] -Clinically improving with supportive care -Hypoxia improving slowly, initially required 4 L O2NCnow down to 1 L -Treated with IV steroids x5 days earlier this hospital stay which was completed on 5/28 -Remdesivir contraindicated in ESRD -Actemra deferred for now, if clinically worsens this would be a consideration -Inflammatory markers improving -Clinically remains stable, hypoxia improving -Attempt to wean off O2, currently down to 1 L -Increase activity, ambulation, physical therapy, incentive spirometer -Repeat COVID from 6/1positive and repeat covid 6/6 positiveonce we have a negative  covidtest patient can go to SNF. Independently reviewed last chest x-ray done on 04/19/2019 which shows bilateral patchy infiltrates right greater than left. Currently O2 saturation 95% on room air at rest.  -Repeat covid done 05/06/2019: Pending  ESRD on HD -Continue renal diet -Nephrology following -Hemodialysis Monday Wednesday Friday -Possible hemodialysis today  Possible UTI/asymptomatic bacteriuria UA urine positive for large leukocytes with >50 WBCs. UCx: Negative. Completed antibiotics.  Essential hypertension his blood pressure has been up and down during the hospital stay he was taking Norvasc Coreg clonidine torsemide and hydralazine prior to admission to hospital.  Will restart Coreg due to uptrending blood pressure and heart rate.  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  Indigestion. Continue Pepcid, Maalox.  DVT prophylaxis:heparin, subcu 3 times daily Code Status:FULL Disposition Plan:needs snf placement with dialysis capacity , however, repeat covid testing on 5/28 remains positive,repeat covid 6/1 and 6/6 positive.  COVID-19 testing done on 05/06/2019 pending  Consultants:  Nephrology Palliative care   Antimicrobials:  Rocephin, completed course.    Objective: Vitals:   078/10/20 1000 05/07/19 1015 05/07/19 1030 05/07/19 1100  BP: (!) 109/39 (!) 100/39 (!) 104/55 117/90  Pulse: 66 (!) 58 77 85  Resp:      Temp:      TempSrc:      SpO2:      Weight:      Height:        Intake/Output Summary (Last 24 hours) at 05/07/2019 1107 Last data filed at 05/07/2019 0341 Gross per 24 hour  Intake 240 ml  Output 250 ml  Net -10 ml  Filed Weights    05/02/19 1112 05/03/19 0500 05/07/19 0735  Weight: 107 kg 110.9 kg 112.4 kg    Exam:  . General: 78 y.o. year-old male well developed well nourished in no acute distress.  Alert and interactive. . Cardiovascular: Regular rate and rhythm with no rubs or gallops.  No thyromegaly or JVD noted.   [name] Respiratory: Mild rales at bases.  No wheezes noted.  Poor inspiratory effort. . Abdomen: Soft nontender nondistended with normal bowel sounds x4 quadrants. . Musculoskeletal: Trace lower extremity edema. 2/4 pulses in all 4 extremities. [name] Psychiatry: Mood is appropriate for condition and setting   Data Reviewed: CBC: Recent Labs  Lab 05/02/19 0756 05/05/19 1416 05/07/19 0700  WBC 11.1* 10.5 8.4  HGB 10.8* 10.2* 9.8*  HCT 33.6* 31.8* 29.9*  MCV 96.6 97.8 96.1  PLT 310 243 008   Basic Metabolic Panel: Recent Labs  Lab 05/01/19 0623 05/02/19 0754 05/05/19 1416 05/07/19 0700  NA 135 136 137 136  K 4.4 4.2 4.6 4.3  CL 96* 97* 99 96*  CO2 24 23 23 25   GLUCOSE 206* 186* 243* 162*  BUN 47* 66* 68* 57*  CREATININE 7.53* 9.80* 9.57* 8.26*  CALCIUM 8.6* 8.8* 8.5* 8.8*  PHOS 4.5 4.8* 3.2 4.0   GFR: Estimated Creatinine Clearance: 9.8 mL/min (A) (by C-G formula based on SCr of 8.26 mg/dL (H)). Liver Function Tests: Recent Labs  Lab 05/01/19 0623 05/02/19 0754 05/05/19 1416 05/07/19 0700  ALBUMIN 2.5* 2.7* 2.4* 2.6*   No results for input(s): LIPASE, AMYLASE in the last 168 hours. No results for input(s): AMMONIA in the last 168 hours. Coagulation Profile: No results for input(s): INR, PROTIME in the last 168 hours. Cardiac Enzymes: No results for input(s): CKTOTAL, CKMB, CKMBINDEX, TROPONINI in the last 168 hours. BNP (last 3 results) No results for input(s): PROBNP in the last 8760 hours. HbA1C: No results for input(s): HGBA1C in the last 72 hours. CBG: Recent Labs  Lab 05/04/19 2029 05/05/19 0815 05/05/19 1255 05/05/19 1727 05/06/19 2111  GLUCAP 207* 124* 180*  115* 162*   Lipid Profile: No results for input(s): CHOL, HDL, LDLCALC, TRIG, CHOLHDL, LDLDIRECT in the last 72 hours. Thyroid Function Tests: No results for input(s): TSH, T4TOTAL, FREET4, T3FREE, THYROIDAB in the last 72 hours. Anemia Panel: Recent Labs    05/05/19 0930 05/07/19 0618  FERRITIN 1,374* 1,258*   Urine analysis:    Component Value Date/Time   COLORURINE YELLOW (A) 04/11/2019 1956   APPEARANCEUR CLOUDY (A) 04/11/2019 1956   LABSPEC 1.016 04/11/2019 1956   PHURINE 7.0 04/11/2019 1956   GLUCOSEU NEGATIVE 04/11/2019 1956   HGBUR NEGATIVE 04/11/2019 [place] NEGATIVE 04/11/2019 [place] NEGATIVE 04/11/2019 1956   PROTEINUR 100 (A) 04/11/2019 1956   NITRITE NEGATIVE 04/11/2019 1956   LEUKOCYTESUR LARGE (A) 04/11/2019 1956   Sepsis Labs: @LABRCNTIP (procalcitonin:4,lacticidven:4)  ) Recent Results (from the past 240 hour(s))  Novel Coronavirus, NAA (hospital order; send-out to ref lab)     Status: None   Collection Time: 04/30/19 12:57 PM  Result Value Ref Range Status   SARS-CoV-2, NAA NOT DETECTED NOT DETECTED Final    Comment: (NOTE) This test was developed and its performance characteristics determined by [employer]. This test has not been FDA cleared or approved. This test has been authorized by FDA under an Emergency Use Authorization (EUA). This test is only authorized for the duration of time the declaration that circumstances exist justifying the authorization of the emergency  use of in vitro diagnostic tests for detection of SARS-CoV-2 virus and/or diagnosis of COVID-19 infection under section 564(b)(1) of the Act, 21 U.S.C. 051GZF-5(O)(2), unless the authorization is terminated or revoked sooner. When diagnostic testing is negative, the possibility of a false negative result should be considered in the context of a patient's recent exposures and the presence of clinical signs and symptoms consistent with COVID-19. An  individual without symptoms of COVID-19 and who is not shedding SARS-CoV-2 virus would expect to have a negative (not detected) result in this assay. Performed  At: [facility] [address] [place], [place] [id number] Rush Farmer MD ZX:[id number]    [place]  Final    Comment: Performed at [facility], [place] [address] [place], [place] [address]  SARS Coronavirus 2     Status: Abnormal   Collection Time: 05/03/19  6:24 AM  Result Value Ref Range Status   SARS Coronavirus 2 DETECTED (A) NOT DETECTED Final    Comment: CRITICAL RESULT CALLED TO, READ BACK BY AND VERIFIED WITH: RN Alger Simons B. 0740 [id number] FCP (NOTE) SARS-CoV-2 target nucleic acids are DETECTED. The SARS-CoV-2 RNA is generally detectable in upper and lower respiratory specimens during the acute phase of infection. Positive results are indicative of active infection with SARS-CoV-2. Clinical  correlation with patient history and other diagnostic information is necessary to determine patient infection status. Positive results do  not rule out bacterial infection or co-infection with other viruses. The expected result is Not Detected. Fact Sheet for Patients: http://www.biofiredefense.com/wp-content/uploads/2020/03/BIOFIRE-COVID -19-patients.pdf Fact Sheet for Healthcare Providers: http://www.biofiredefense.com/wp-content/uploads/2020/03/BIOFIRE-COVID -19-hcp.pdf This test is not yet approved or cleared by the [place] and  has been authorized for detection and/or diagnosis of SARS-CoV-2 by FDA under an Emergency  Use Authorization (EUA).  This EUA will remain in effect (meaning this test can be used) for the duration of  the COVID-19 declaration under Section 564(b)(1) of the Act, 21 U.S.C. section [phone] 3(b)(1), unless the authorization is terminated or revoked sooner. Performed at [facility], [place] [address], [place], [place] [address]       Studies:  No results found.  Scheduled Meds: . atorvastatin  40 mg Oral QHS  . benzonatate  100 mg Oral TID  . calcium acetate  667 mg Oral TID WC  . Chlorhexidine Gluconate Cloth  6 each Topical Q0600  . clopidogrel  75 mg Oral Daily  . dorzolamide-timolol  1 drop Both Eyes BID  . famotidine  20 mg Oral Daily  . heparin  7,500 Units Subcutaneous Q8H  . insulin aspart  0-15 Units Subcutaneous TID WC  . insulin aspart  8 Units Subcutaneous TID WC  . insulin detemir  15 Units Subcutaneous QHS  . pantoprazole  40 mg Oral BID  . sodium chloride flush  3 mL Intravenous Q12H  . vitamin C  500 mg Oral Daily  . zinc sulfate  220 mg Oral Daily    Continuous Infusions: . sodium chloride    . sodium chloride       LOS: 25 days     Kayleen Memos, MD [facility] Pager [phone]  If 7PM-7AM, please contact night-coverage [email or web address] Password [email or web address] 05/07/2019, 11:07 AM

## 2019-05-07 NOTE — Plan of Care (Signed)
  Problem: Education: Goal: Knowledge of General Education information will improve Description Including pain rating scale, medication(s)/side effects and non-pharmacologic comfort measures Outcome: Progressing   Problem: Health Behavior/Discharge Planning: Goal: Ability to manage health-related needs will improve Outcome: Progressing   Problem: Clinical Measurements: Goal: Ability to maintain clinical measurements within normal limits will improve Outcome: Progressing Goal: Will remain free from infection Outcome: Progressing Goal: Diagnostic test results will improve Outcome: Progressing Goal: Respiratory complications will improve Outcome: Progressing Goal: Cardiovascular complication will be avoided Outcome: Progressing   Problem: Activity: Goal: Risk for activity intolerance will decrease Outcome: Progressing   Problem: Nutrition: Goal: Adequate nutrition will be maintained Outcome: Progressing   Problem: Coping: Goal: Level of anxiety will decrease Outcome: Progressing   Problem: Elimination: Goal: Will not experience complications related to bowel motility Outcome: Progressing Goal: Will not experience complications related to urinary retention Outcome: Progressing   Problem: Pain Managment: Goal: General experience of comfort will improve Outcome: Progressing   Problem: Safety: Goal: Ability to remain free from injury will improve Outcome: Progressing   Problem: Skin Integrity: Goal: Risk for impaired skin integrity will decrease Outcome: Progressing   Problem: Fluid Volume: Goal: Compliance with measures to maintain balanced fluid volume will improve Outcome: Progressing   Problem: Health Behavior/Discharge Planning: Goal: Ability to manage health-related needs will improve Outcome: Progressing   Problem: Clinical Measurements: Goal: Complications related to the disease process, condition or treatment will be avoided or minimized Outcome:  Progressing   Problem: Education: Goal: Knowledge of risk factors and measures for prevention of condition will improve Outcome: Progressing   Problem: Coping: Goal: Psychosocial and spiritual needs will be supported Outcome: Progressing   Problem: Respiratory: Goal: Will maintain a patent airway Outcome: Progressing Goal: Complications related to the disease process, condition or treatment will be avoided or minimized Outcome: Progressing

## 2019-05-07 NOTE — Progress Notes (Signed)
[facility] Progress Note    Assessment/ Plan:   Dialyzes MebaneDavitaph [phone]  MWF, BFR400 [id number] AVF 4 hours EDW 116kg2K/3Ca  Assessment/Plan: 1. covid-19 pneumonia-Repeatswabneg from 6/3. 6/6 send out was positive. Repeated 6/9. 2. ESRD- cont with HD qMWFvia AVF- cont on schedule. He ismuch below edw and BP's stable. Continue to challenge edw to help with hypoxia- only on low dose coreg. Now it seems that will be placed in a SNF that will require a change in dialysis facilities ? So does not have solid OP HD spot at this time; still waiting for placement [facility] [place] for next HD Fri. All hepatitis ab tests requested have been ordered.  3. Diarrhea and abdominal pain- workup per primary svc 4. 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 SHPTH- stable- no binder listed on home med list- phos was starting to creep up- added phoslo - PTH 337- no meds for now 6. Second degree heart block- per primary 7. Dispo- Awaiting SNF placement - - can run as covid positive at [facility] on TTS but possibly not able to be placed until negative - all to be determined  Subjective:   Denies f/c/n/v. Tolerating PO's Cough (suctioning himself) but denies dyspnea. Tolerated tx today.   Objective:   BP 101/63 (BP Location: Left Arm)   Pulse 98   Temp 98.8 F (37.1 C) (Oral)   Resp 18   Ht 6\' 1"  (1.854 m)   Wt 110.4 kg   SpO2 98%   BMI 32.11 kg/m   Intake/Output Summary (Last 24 hours) at 05/07/2019 1513 Last data filed at 05/07/2019 1141 Gross per 24 hour  Intake 240 ml  Output 2143 ml  Net -1903 ml   Weight change:   Physical Exam: GEN: NAD, pleasant HEENT: No conjunctival pallor, EOMI NECK: Supple, no thyromegaly CV: RRR ABD: SNDNT +BS  EXT: No lower extremity edema    Imaging: No results found.  Labs: BMET Recent Labs  Lab 05/01/19 0623 05/02/19 0754 05/05/19 1416  05/07/19 0700  NA 135 136 137 136  K 4.4 4.2 4.6 4.3  CL 96* 97* 99 96*  CO2 24 23 23 25   GLUCOSE 206* 186* 243* 162*  BUN 47* 66* 68* 57*  CREATININE 7.53* 9.80* 9.57* 8.26*  CALCIUM 8.6* 8.8* 8.5* 8.8*  PHOS 4.5 4.8* 3.2 4.0   CBC Recent Labs  Lab 05/02/19 0756 05/05/19 1416 05/07/19 0700  WBC 11.1* 10.5 8.4  HGB 10.8* 10.2* 9.8*  HCT 33.6* 31.8* 29.9*  MCV 96.6 97.8 96.1  PLT 310 243 190    Medications:    . atorvastatin  40 mg Oral QHS  . benzonatate  100 mg Oral TID  . calcium acetate  667 mg Oral TID WC  . Chlorhexidine Gluconate Cloth  6 each Topical Q0600  . clopidogrel  75 mg Oral Daily  . dorzolamide-timolol  1 drop Both Eyes BID  . famotidine  20 mg Oral Daily  . heparin  7,500 Units Subcutaneous Q8H  . insulin aspart  0-15 Units Subcutaneous TID WC  . insulin aspart  8 Units Subcutaneous TID WC  . insulin detemir  15 Units Subcutaneous QHS  . pantoprazole  40 mg Oral BID  . sodium chloride flush  3 mL Intravenous Q12H  . vitamin C  500 mg Oral Daily  . zinc sulfate  220 mg Oral Daily      Otelia Santee, MD  05/07/2019, 3:13 PM

## 2019-05-07 NOTE — Progress Notes (Signed)
Patient ambulated to toilet and attempted to have BM. This RN took vitals on patient after returning to bed. Per pulse oximetry, patient HR 43. BP 155/56, pt denied dizziness. Pt HR return to 90s after several minutes. MD on call notified. Waiting for response.

## 2019-05-07 NOTE — Progress Notes (Signed)
Renal Navigator contacted [place] [facility] to check on referral status for OP HD seat and was informed that referral has not yet been received. Renal Navigator confirmed that fax confirmation was received at 3:08pm yesterday. Renal Navigator requested an update as soon as possible.  Alphonzo Cruise, [place] Renal Navigator [phone]

## 2019-05-07 NOTE — Progress Notes (Addendum)
Occupational Therapy Treatment Patient Details Name: Cory Miller MRN: [id number] DOB: May 05, 1941 Today's Date: 05/07/2019    History of present illness Cory Miller is a 78 y.o. male with medical history significant of HTN, HLD, CAD  s/p CABG, ESRD on HD(M/W/F), DM type II, CVA, GERD, and hard of hearing; who presented to [facility] with complaints of fever, chills, cough, and shortness of breath approximately 2 weeks.  Reports having fevers up to 101 F at home. found to be Covid 19 +   OT comments  Pt making steady progress towards OT goals, presents supine in bed agreeable to therapy session. Pt initially completing room level mobility with RW, progressed to use of SPC during session (majority using SPC) with close minguard-light minA. Pt overall completing x5 laps mobility in room during session; x1 EOB to/from bathroom; x3 laps EOB to/from door and back with seated rest break at sink, and then return to EOB end of session. Pt with 1 instance of increased dizziness after toileting task, BP taken and stable in sitting, with pt reporting symptoms subsiding with seated rest. Pt requiring minA for toileting, LB bathing task today. Performing seated grooming and UB bathing ADL with setup/minguard assist. Pt on RA with O2 sats maintaining >90%. Feel POC remains appropriate at this time. Will continue to follow acutely.   Follow Up Recommendations  SNF;Supervision/Assistance - 24 hour    Equipment Recommendations  None recommended by OT          Precautions / Restrictions Precautions Precautions: Fall Precaution Comments: Fall risk low (especially with RW), but present Restrictions Weight Bearing Restrictions: No       Mobility Bed Mobility Overal bed mobility: Needs Assistance Bed Mobility: Supine to Sit;Sit to Supine     Supine to sit: Supervision Sit to supine: Supervision      Transfers Overall transfer level: Needs assistance Equipment used: Rolling  walker (2 wheeled);Quad cane Transfers: Sit to/from Stand Sit to Stand: Supervision         General transfer comment: increased time/effort, no physical assist required    Balance Overall balance assessment: Needs assistance Sitting-balance support: No upper extremity supported;Feet supported Sitting balance-Leahy Scale: Good     Standing balance support: Bilateral upper extremity supported;During functional activity Standing balance-Leahy Scale: Fair Standing balance comment: able to maintain static standing without UE support                           ADL either performed or assessed with clinical judgement   ADL Overall ADL's : Needs assistance/impaired     Grooming: Wash/dry hands;Wash/dry face;Oral care;Set up;Supervision/safety;Sitting;Brushing hair Grooming Details (indicate cue type and reason): performed in sitting as pt fatigued after laps in room Upper Body Bathing: Min guard;Set up;Sitting   Lower Body Bathing: Min guard;Minimal assistance;Sit to/from stand Lower Body Bathing Details (indicate cue type and reason): close minguard-minA for standing balance Upper Body Dressing : Min guard;Minimal assistance;Sitting Upper Body Dressing Details (indicate cue type and reason): doffing/donning new gown     Toilet Transfer: Minimal assistance;Ambulation;RW;Grab bars Toilet Transfer Details (indicate cue type and reason): RW to bathroom, use of SPC exiting bathroom Toileting- Clothing Manipulation and Hygiene: Minimal assistance;Sit to/from stand Toileting - Clothing Manipulation Details (indicate cue type and reason): for balance in standing; pt performing peri-care in sitting today after BM     Functional mobility during ADLs: Min guard;Minimal assistance;Rolling walker;Cane General ADL Comments: pt with x1 instance reporting increased dizziness and  returned to sitting EOB, BP stable at this time     Vision       Perception     Praxis       Cognition Arousal/Alertness: Awake/alert Behavior During Therapy: WFL for tasks assessed/performed Overall Cognitive Status: Difficult to assess                                 General Comments: pt very hard of hearing so difficult to follow directions; requires cues for safety during session        Exercises     Shoulder Instructions       General Comments pt on RA today with O2 sats maintaining >90%    Pertinent Vitals/ Pain       Pain Assessment: No/denies pain  Home Living                                          Prior Functioning/Environment              Frequency  Min 2X/week        Progress Toward Goals  OT Goals(current goals can now be found in the care plan section)  Progress towards OT goals: Progressing toward goals  Acute Rehab OT Goals Patient Stated Goal: none stated, agreeable to therapy session OT Goal Formulation: With patient Time For Goal Achievement: 05/14/19 Potential to Achieve Goals: Good ADL Goals Pt Will Perform Grooming: with modified independence;standing Pt Will Perform Lower Body Dressing: with modified independence;sit to/from stand;sitting/lateral leans Pt Will Transfer to Toilet: with modified independence;ambulating;regular height toilet Pt Will Perform Toileting - Clothing Manipulation and hygiene: with modified independence;sit to/from stand Additional ADL Goal #1: Pt will demonstrate increased activity tolerance to perform four ADL tasks in standing with supervision  Plan Discharge plan remains appropriate    Co-evaluation                 AM-PAC OT "6 Clicks" Daily Activity     Outcome Measure   Help from another person eating meals?: None Help from another person taking care of personal grooming?: None Help from another person toileting, which includes using toliet, bedpan, or urinal?: A Little Help from another person bathing (including washing, rinsing, drying)?: A  Little Help from another person to put on and taking off regular upper body clothing?: None Help from another person to put on and taking off regular lower body clothing?: A Little 6 Click Score: 21    End of Session Equipment Utilized During Treatment: Gait belt;Rolling walker;Other (comment)(cane)  OT Visit Diagnosis: Unsteadiness on feet (R26.81);Other abnormalities of gait and mobility (R26.89);Muscle weakness (generalized) (M62.81);Other symptoms and signs involving cognitive function   Activity Tolerance Patient tolerated treatment well   Patient Left in bed;with call bell/phone within reach;with bed alarm set   Nurse Communication Mobility status        Time: 4098-1191 OT Time Calculation (min): 54 min  Charges: OT General Charges $OT Visit: 1 Visit OT Treatments $Self Care/Home Management : 53-67 mins  Lou Cal, OT Supplemental Rehabilitation Services Pager [phone] Office [phone]   Raymondo Band 05/07/2019, 4:46 PM

## 2019-05-08 LAB — GLUCOSE, CAPILLARY
Glucose-Capillary: 180 mg/dL — ABNORMAL HIGH (ref 70–99)
Glucose-Capillary: 162 mg/dL — ABNORMAL HIGH (ref 70–99)
Glucose-Capillary: 160 mg/dL — ABNORMAL HIGH (ref 70–99)
Glucose-Capillary: 130 mg/dL — ABNORMAL HIGH (ref 70–99)
Glucose-Capillary: 130 mg/dL — ABNORMAL HIGH (ref 70–99)
Glucose-Capillary: 133 mg/dL — ABNORMAL HIGH (ref 70–99)
Glucose-Capillary: 159 mg/dL — ABNORMAL HIGH (ref 70–99)
Glucose-Capillary: 184 mg/dL — ABNORMAL HIGH (ref 70–99)
Glucose-Capillary: 164 mg/dL — ABNORMAL HIGH (ref 70–99)
Glucose-Capillary: 163 mg/dL — ABNORMAL HIGH (ref 70–99)
Glucose-Capillary: 214 mg/dL — ABNORMAL HIGH (ref 70–99)
Glucose-Capillary: 133 mg/dL — ABNORMAL HIGH (ref 70–99)
Glucose-Capillary: 125 mg/dL — ABNORMAL HIGH (ref 70–99)

## 2019-05-08 LAB — HEPATITIS B SURFACE ANTIGEN: Hepatitis B Surface Ag: NEGATIVE

## 2019-05-08 LAB — HEPATITIS B SURFACE ANTIBODY,QUALITATIVE: Hep B S Ab: NONREACTIVE

## 2019-05-08 LAB — HEPATITIS B CORE ANTIBODY, TOTAL: Hep B Core Total Ab: NEGATIVE

## 2019-05-08 NOTE — Progress Notes (Signed)
Rehab Admissions Coordinator Note:  Patient was screened by Michel Santee for appropriateness for an Inpatient Acute Rehab Consult. Note that pt's most recent (+) test was on 6/6.  Will continue to follow at a distance until patient meets current CDC guidelines for discharge to our facility.   Shann Medal, PT, DPT Admissions Coordinator [phone] 05/08/19  2:46 PM

## 2019-05-08 NOTE — Progress Notes (Signed)
Renal Navigator called [place] [place] [facility] again to check on transfer from [place] to [place] + shift. Renal Navigator was told that there was no referral information in their system from 05/06/19. Renal Navigator informed that a fax confirmation was received and that Renal Navigator called yesterday, 05/07/19 requesting a returned call that was never received. Admissions Coordinator was very apologetic and took the referral over the phone. Renal Navigator faxed medical records again and states that if Renal Navigator does not have a plan in place for patient by 1pm, Renal Navigator will call back. [place] Admissions Coordinator agreed. Renal Navigator updated CSW.  Cory Miller, [place] Renal Navigator [phone]

## 2019-05-08 NOTE — Progress Notes (Signed)
Physical Therapy Treatment Patient Details Name: Cory Miller MRN: [id number] DOB: 1941/03/12 Today's Date: 05/08/2019    History of Present Illness Cory Miller is a 78 y.o. male with medical history significant of HTN, HLD, CAD  s/p CABG, ESRD on HD(M/W/F), DM type II, CVA, GERD, and hard of hearing; who presented to [facility] with complaints of fever, chills, cough, and shortness of breath approximately 2 weeks.  Reports having fevers up to 101 F at home. found to be Covid 19 +    PT Comments    Pt admitted with above diagnosis. Pt currently with functional limitations due to balance and endurance deficits. Pt was able to ambulate with cane with min assist for balance with challenges.  Pt used cane at home prior to admit.  Feel that once pt is negative for COVID, would be a good rehab candidate as he has progressed well and would not need too much more time on Rehab.   Pt will benefit from skilled PT to increase their independence and safety with mobility to allow discharge to the venue listed below.     Follow Up Recommendations  CIR     Equipment Recommendations  Rolling walker with 5" wheels    Recommendations for Other Services OT consult     Precautions / Restrictions Precautions Precautions: Fall Precaution Comments: Fall risk low (especially with RW), but present Restrictions Weight Bearing Restrictions: No    Mobility  Bed Mobility Overal bed mobility: Needs Assistance Bed Mobility: Supine to Sit;Sit to Supine     Supine to sit: Supervision Sit to supine: Supervision   General bed mobility comments: increased time/effort but no physical assist required  Transfers Overall transfer level: Needs assistance Equipment used: Rolling walker (2 wheeled);Quad cane Transfers: Sit to/from Stand Sit to Stand: Min guard         General transfer comment: Needed assist if bed not raised.  Ambulation/Gait Ambulation/Gait assistance: Min  guard;Min assist Gait Distance (Feet): 225 Feet Assistive device: Straight cane Gait Pattern/deviations: Step-through pattern;Decreased weight shift to left;Decreased stance time - left;Antalgic Gait velocity: decr Gait velocity interpretation: 1.31 - 2.62 ft/sec, indicative of limited community ambulator General Gait Details: Ambulated with cane with good stability with  LOB only with moderate challenge.  Stopped by bathroom and pt used bathroom.  Amb on RA with SpO2 >90%.      Stairs             Wheelchair Mobility    Modified Rankin (Stroke Patients Only)       Balance Overall balance assessment: Needs assistance Sitting-balance support: No upper extremity supported;Feet supported Sitting balance-Leahy Scale: Good     Standing balance support: Bilateral upper extremity supported;During functional activity Standing balance-Leahy Scale: Fair Standing balance comment: able to maintain static standing without UE support,standing at sink and washed hands.  dynamic activity more difficult with cane with pt needing steadying assist with challenges.                             Cognition Arousal/Alertness: Awake/alert Behavior During Therapy: WFL for tasks assessed/performed Overall Cognitive Status: Difficult to assess                                 General Comments: pt very hard of hearing so difficult to follow directions; requires cues for safety during session, use paper to write/communicate to pt  Exercises General Exercises - Lower Extremity Long Arc Quad: AROM;Both;10 reps;Seated Hip Flexion/Marching: AROM;Both;10 reps;Seated;Standing Mini-Sqauts: AROM;Both;10 reps;Standing    General Comments        Pertinent Vitals/Pain Pain Assessment: No/denies pain    Home Living                      Prior Function            PT Goals (current goals can now be found in the care plan section) Acute Rehab PT Goals Patient  Stated Goal: none stated, agreeable to therapy session Progress towards PT goals: Progressing toward goals    Frequency    Min 3X/week      PT Plan Discharge plan needs to be updated    Co-evaluation              AM-PAC PT "6 Clicks" Mobility   Outcome Measure  Help needed turning from your back to your side while in a flat bed without using bedrails?: A Little Help needed moving from lying on your back to sitting on the side of a flat bed without using bedrails?: A Little Help needed moving to and from a bed to a chair (including a wheelchair)?: A Little Help needed standing up from a chair using your arms (e.g., wheelchair or bedside chair)?: A Little Help needed to walk in hospital room?: A Little Help needed climbing 3-5 steps with a railing? : A Lot 6 Click Score: 17    End of Session Equipment Utilized During Treatment: Gait belt Activity Tolerance: Patient tolerated treatment well Patient left: in bed;with call bell/phone within reach Nurse Communication: Mobility status PT Visit Diagnosis: Difficulty in walking, not elsewhere classified (R26.2);Other (comment);Pain Pain - Right/Left: Left Pain - part of body: Ankle and joints of foot     Time: [id number] PT Time Calculation (min) (ACUTE ONLY): 36 min  Charges:  $Gait Training: 8-22 mins $Therapeutic Exercise: 8-22 mins                     [place] Pager:  [phone]  Office:  [place] 05/08/2019, 2:18 PM

## 2019-05-08 NOTE — Care Management Important Message (Signed)
Important Message  Patient Details  Name: Cory Miller MRN: [id number] Date of Birth: 03/29/1941   Medicare Important Message Given:  Yes    Zenon Mayo, RN 05/08/2019, 8:57 AM

## 2019-05-08 NOTE — Progress Notes (Signed)
[facility] Progress Note    Assessment/ Plan:   Dialyzes MebaneDavitaph [phone]  MWF, BFR400 GGE366 AVF 4 hours EDW 116kg2K/3Ca  Assessment/Plan: 1. covid-19 pneumonia-Repeatswabneg from 6/3. 6/6 send out was positive.  2. ESRD- cont with HD qMWFvia AVF- cont on schedule. He ismuch below edw and BP's stable. Continue to challenge edw to help with hypoxia- only on low dose coreg.   Tolerated HD Wed -> See plan for HD below under disposition.  3. Diarrhea and abdominal pain- workup per primary svc 4. 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 SHPTH- stable- no binder listed on home med list- phos was starting to creep up- added phoslo - PTH 337- no meds for now 6. Second degree heart block- per primary 7. Dispo- appreciate the hard work of [employer]; pt has been accepted at [facility] on a TTS schedule with a seat time of 12:30pm for the COVID + shift. He needs to arrive at 12:15pm. His SNF [facility] in [place] has confirmed that they will provide transportation for patient to OP HD treatment.   Patient can start first outpt treatment on Saturday, 05/10/19. If he is still here through the weekend then will plan on HD Saturday @ [facility] on TTS regimen. Otherwise from renal standpoint he can be transferred to [facility] in [place] and start outpt HD this Sat; his last HD was Wed.  8. Renal Navigator notified CSW/[name], CM/D. Lovena Le and Nephrologist/Dr. Augustin Coupe.  Subjective:   He's been suctioning himself for secretions Refused blood work today.   Objective:   BP 132/60 (BP Location: Left Arm)   Pulse 92   Temp (!) 97.4 F (36.3 C) (Oral)   Resp 20   Ht 6\' 1"  (1.854 m)   Wt 112.5 kg   SpO2 95%   BMI 32.72 kg/m   Intake/Output Summary (Last 24 hours) at 05/08/2019 1325 Last data filed at 05/08/2019 0900 Gross per 24 hour  Intake 360 ml  Output -  Net 360 ml   Weight change:    Physical Exam: Direct physical contact not performed out of concern for covid-19 infection and utilizing exam documented by primary team and RN documentation.   Imaging: No results found.  Labs: BMET Recent Labs  Lab 05/02/19 0754 05/05/19 1416 05/07/19 0700  NA 136 137 136  K 4.2 4.6 4.3  CL 97* 99 96*  CO2 23 23 25   GLUCOSE 186* 243* 162*  BUN 66* 68* 57*  CREATININE 9.80* 9.57* 8.26*  CALCIUM 8.8* 8.5* 8.8*  PHOS 4.8* 3.2 4.0   CBC Recent Labs  Lab 05/02/19 0756 05/05/19 1416 05/07/19 0700  WBC 11.1* 10.5 8.4  HGB 10.8* 10.2* 9.8*  HCT 33.6* 31.8* 29.9*  MCV 96.6 97.8 96.1  PLT 310 243 190    Medications:    . atorvastatin  40 mg Oral QHS  . benzonatate  100 mg Oral TID  . calcium acetate  667 mg Oral TID WC  . Chlorhexidine Gluconate Cloth  6 each Topical Q0600  . clopidogrel  75 mg Oral Daily  . dorzolamide-timolol  1 drop Both Eyes BID  . famotidine  20 mg Oral Daily  . heparin  7,500 Units Subcutaneous Q8H  . insulin aspart  0-15 Units Subcutaneous TID WC  . insulin aspart  8 Units Subcutaneous TID WC  . insulin detemir  15 Units Subcutaneous QHS  . pantoprazole  40 mg Oral BID  .  sodium chloride flush  3 mL Intravenous Q12H  . vitamin C  500 mg Oral Daily  . zinc sulfate  220 mg Oral Daily      Otelia Santee, MD 05/08/2019, 1:25 PM

## 2019-05-08 NOTE — Progress Notes (Signed)
Renal Navigator received call from [facility] who requested [place] lab results and additional progress notes. Renal Navigator faxed records and asked that patient start at [place] + shift on Saturday. She states this is not normally done, but they are working to make this a possibility. Renal Navigator states appreciation.  Alphonzo Cruise, [place] Renal Navigator [phone]

## 2019-05-08 NOTE — Progress Notes (Signed)
PROGRESS NOTE  Cory Miller ZRA:[id number] DOB: 1941-05-19 DOA: 04/12/2019 PCP: [name], MD  HPI/Recap of past 24 hours: Brief Narrative:77 y.o.malewith medical history significant ofHTN, HLD, CADs/pCABG, ESRD 23on HD(M/W/F), DM type II, CVA, GERD, and hard of hearing; who presented [facility] with complaints of fever, chills, cough, and shortness of breath approximately 2 weeks. Reports having fevers up to 101 F at home. Presented to [facility] ER, chest x-ray noted basilar congestion without any focal airspace opacity. COVID-19 testing was positive [place] on 5/15, transferred to [facility], due to need for hemodialysis. -With supportive care oxygen and IV steroids initially -Followed by nephrology, continued hemodialysis   05/08/19: Patient seen and examined at his bedside.  No acute events overnight.  Completed hemodialysis yesterday without complication.  He has no new concerns.  CSW working on placement.  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)   HTN (hypertension)   End stage renal disease (HCC)   Hypokalemia   Anemia of chronic disease   Palliative care by specialist   Goals of care, counseling/discussion   Advanced care planning/counseling discussion  COVID-19 Viral illness,  -Transferred from [facility] -Clinically improving with supportive care -Hypoxia improving slowly, initially required 4 L O2NCnow down to 1 L -Treated with IV steroids x5 days earlier this hospital stay which was completed on 5/28 -Remdesivir contraindicated in ESRD -Actemra deferred for now, if clinically worsens this would be a consideration -Inflammatory markers improving -Clinically remains stable, hypoxia improving -Attempt to wean off O2, currently down to 1 L -Increase activity, ambulation, physical therapy, incentive spirometer -Repeat COVID  from 6/1positive and repeat covid 6/6 positive -CSW working on placement, possible DC tomorrow 05/09/2019.  ESRD on HD -Continue renal diet -Nephrology following -Patient has an HD spot per nephrology accepted at [facility] dialysis center on a Tuesday Thursday Saturday schedule with a seat time of 12:30 PM for the COVID-19 positive shift.  He needs to arrive at 12:15 PM.  His SNF [facility] has confirmed that they will provide transportation for patient to outpatient HD treatment per Nephrology.  Patient can start first outpatient treatment on Saturday, 05/10/2019.  Possible UTI/asymptomatic bacteriuria UA urine positive for large leukocytes with >50 WBCs. UCx: Negative. Completed antibiotics.  HTN Blood pressure is normotensive without antihypertensive His home dose of clonidine Coreg and amlodipine are being held to avoid hypotension.  Diabetes mellitus type 2 uncontrolled with hyperglycemia with renal complication -Hemoglobin A1c 7.8 on 04/25/2019 -Stable, continue Levemir 15 units nightly and NovoLog 8 units with meals -Avoid hypoglycemia in the state of ESRD  Anemia of chronic disease Hemoglobin appears stable Continue to monitor  History of embolic CVA -Continue Plavix and statin  Deconditioning PT/OT consultedrecommends SNF. Possible discharge to SNF tomorrow 05/09/2019  Indigestion. Continue Pepcid, Maalox.  DVT prophylaxis:heparin, subcu 3 times daily Code Status:FULL Disposition Plan:Possible discharge to SNF tomorrow 05/09/2019.  Consultants:  Nephrology  Palliative care   Antimicrobials:  Rocephin, completed course.    Objective: Vitals:   05/07/19 1619 05/07/19 2317 05/08/19 0612 05/08/19 0800  BP: 124/68 (!) 129/48  132/60  Pulse: (!) 105 96  92  Resp: (!) 22 20    Temp: 97.9 F (36.6 C) (!) 97.4 F (36.3 C)  (!) 97.4 F (36.3 C)  TempSrc: Oral Oral  Oral  SpO2: 96% 94%  95%  Weight:   112.5 kg   Height:  Intake/Output Summary (Last 24 hours) at 05/08/2019 1425 Last data filed at 05/08/2019 0900 Gross per 24 hour  Intake 360 ml  Output -  Net 360 ml   Filed Weights   05/07/19 0735 05/07/19 1141 05/08/19 0612  Weight: 112.4 kg 110.4 kg 112.5 kg    Exam:  . General: 78 y.o. year-old male developed well-nourished no acute distress.  Alert and interactive.   . Cardiovascular: Regular rate and rhythm with no rubs or gallops.  No JVD or thyromegaly noted. [name] Respiratory: Mild rales at bases with no wheezes noted.  Poor inspiratory effort.   . Abdomen: Obese nontender with normal bowel sounds x4 quadrants. . Musculoskeletal: Trace lower extremity edema.  2/4 pulses in all 4 extremities.   [name] Psychiatry: Mood is appropriate for condition and setting.   Data Reviewed: CBC: Recent Labs  Lab 05/02/19 0756 05/05/19 1416 05/07/19 0700  WBC 11.1* 10.5 8.4  HGB 10.8* 10.2* 9.8*  HCT 33.6* 31.8* 29.9*  MCV 96.6 97.8 96.1  PLT 310 243 272   Basic Metabolic Panel: Recent Labs  Lab 05/02/19 0754 05/05/19 1416 05/07/19 0700  NA 136 137 136  K 4.2 4.6 4.3  CL 97* 99 96*  CO2 23 23 25   GLUCOSE 186* 243* 162*  BUN 66* 68* 57*  CREATININE 9.80* 9.57* 8.26*  CALCIUM 8.8* 8.5* 8.8*  PHOS 4.8* 3.2 4.0   GFR: Estimated Creatinine Clearance: 9.8 mL/min (A) (by C-G formula based on SCr of 8.26 mg/dL (H)). Liver Function Tests: Recent Labs  Lab 05/02/19 0754 05/05/19 1416 05/07/19 0700  ALBUMIN 2.7* 2.4* 2.6*   No results for input(s): LIPASE, AMYLASE in the last 168 hours. No results for input(s): AMMONIA in the last 168 hours. Coagulation Profile: No results for input(s): INR, PROTIME in the last 168 hours. Cardiac Enzymes: No results for input(s): CKTOTAL, CKMB, CKMBINDEX, TROPONINI in the last 168 hours. BNP (last 3 results) No results for input(s): PROBNP in the last 8760 hours. HbA1C: No results for input(s): HGBA1C in the last 72 hours. CBG: Recent Labs  Lab 05/04/19  2029 05/05/19 0815 05/05/19 1255 05/05/19 1727 05/06/19 2111  GLUCAP 207* 124* 180* 115* 162*   Lipid Profile: No results for input(s): CHOL, HDL, LDLCALC, TRIG, CHOLHDL, LDLDIRECT in the last 72 hours. Thyroid Function Tests: No results for input(s): TSH, T4TOTAL, FREET4, T3FREE, THYROIDAB in the last 72 hours. Anemia Panel: Recent Labs    05/07/19 0618  FERRITIN 1,258*   Urine analysis:    Component Value Date/Time   COLORURINE YELLOW (A) 04/11/2019 1956   APPEARANCEUR CLOUDY (A) 04/11/2019 1956   LABSPEC 1.016 04/11/2019 1956   PHURINE 7.0 04/11/2019 [place] 04/11/2019 1956   HGBUR NEGATIVE 04/11/2019 [place] NEGATIVE 04/11/2019 [place] NEGATIVE 04/11/2019 1956   PROTEINUR 100 (A) 04/11/2019 1956   NITRITE NEGATIVE 04/11/2019 1956   LEUKOCYTESUR LARGE (A) 04/11/2019 1956   Sepsis Labs: @LABRCNTIP (procalcitonin:4,lacticidven:4)  ) Recent Results (from the past 240 hour(s))  Novel Coronavirus, NAA (hospital order; send-out to ref lab)     Status: None   Collection Time: 04/30/19 12:57 PM   Specimen: Nasopharyngeal Swab; Respiratory  Result Value Ref Range Status   SARS-CoV-2, NAA NOT DETECTED NOT DETECTED Final    Comment: (NOTE) This test was developed and its performance characteristics determined by [employer]. This test has not been FDA cleared or approved. This test has been authorized by FDA under an Emergency Use Authorization (EUA). This  test is only authorized for the duration of time the declaration that circumstances exist justifying the authorization of the emergency use of in vitro diagnostic tests for detection of SARS-CoV-2 virus and/or diagnosis of COVID-19 infection under section 564(b)(1) of the Act, 21 U.S.C. 017PZW-2(H)(8), unless the authorization is terminated or revoked sooner. When diagnostic testing is negative, the possibility of a false negative result should be considered in the context  of a patient's recent exposures and the presence of clinical signs and symptoms consistent with COVID-19. An individual without symptoms of COVID-19 and who is not shedding SARS-CoV-2 virus would expect to have a negative (not detected) result in this assay. Performed  At: [facility] [address] [place], [place] [id number] Rush Farmer MD NT:[id number]    [place]  Final    Comment: Performed at [facility], [place] [address] [place], [place] [address]  SARS Coronavirus 2     Status: Abnormal   Collection Time: 05/03/19  6:24 AM  Result Value Ref Range Status   SARS Coronavirus 2 DETECTED (A) NOT DETECTED Final    Comment: CRITICAL RESULT CALLED TO, READ BACK BY AND VERIFIED WITH: RN Alger Simons B. [id number] [id number] FCP (NOTE) SARS-CoV-2 target nucleic acids are DETECTED. The SARS-CoV-2 RNA is generally detectable in upper and lower respiratory specimens during the acute phase of infection. Positive results are indicative of active infection with SARS-CoV-2. Clinical  correlation with patient history and other diagnostic information is necessary to determine patient infection status. Positive results do  not rule out bacterial infection or co-infection with other viruses. The expected result is Not Detected. Fact Sheet for Patients: http://www.biofiredefense.com/wp-content/uploads/2020/03/BIOFIRE-COVID -19-patients.pdf Fact Sheet for Healthcare Providers: http://www.biofiredefense.com/wp-content/uploads/2020/03/BIOFIRE-COVID -19-hcp.pdf This test is not yet approved or cleared by the [place] and  has been authorized for detection and/or diagnosis of SARS-CoV-2 by FDA under an Emergency  Use Authorization (EUA).  This EUA will remain in effect (meaning this test can be used) for the duration of  the COVID-19 declaration under Section 564(b)(1) of the Act, 21 U.S.C. section [phone] 3(b)(1), unless the authorization is terminated or  revoked sooner. Performed at [facility], [place] [address] [place], [place] [address]       Studies: No results found.  Scheduled Meds: . atorvastatin  40 mg Oral QHS  . benzonatate  100 mg Oral TID  . calcium acetate  667 mg Oral TID WC  . Chlorhexidine Gluconate Cloth  6 each Topical Q0600  . clopidogrel  75 mg Oral Daily  . dorzolamide-timolol  1 drop Both Eyes BID  . famotidine  20 mg Oral Daily  . heparin  7,500 Units Subcutaneous Q8H  . insulin aspart  0-15 Units Subcutaneous TID WC  . insulin aspart  8 Units Subcutaneous TID WC  . insulin detemir  15 Units Subcutaneous QHS  . pantoprazole  40 mg Oral BID  . sodium chloride flush  3 mL Intravenous Q12H  . vitamin C  500 mg Oral Daily  . zinc sulfate  220 mg Oral Daily    Continuous Infusions: . sodium chloride    . sodium chloride       LOS: 26 days     Kayleen Memos, MD [facility] Pager [phone]  If 7PM-7AM, please contact night-coverage [email or web address] Password [email or web address] 05/08/2019, 2:25 PM

## 2019-05-08 NOTE — Progress Notes (Signed)
Lab came twice this morning but patient has been uncooperative.  He is not allowing lab to use warm packs to assist with taking blood. Patient  is requesting the use of a warm towel and as per lab they are unable to provide that request. Patient yelling at staff and adamantly wanting his way of giving blood.

## 2019-05-08 NOTE — Progress Notes (Signed)
Patient has been accepted at [facility] on a TTS schedule with a seat time of 12:30pm for the COVID + shift. He needs to arrive at 12:15pm. His SNF [facility] in [place] has confirmed that they will provide transportation for patient to OP HD treatment. Patient can start first treatment on Saturday, 05/10/19. Renal Navigator notified CSW/[name], CM/D. Lovena Le and Nephrologist/Dr. Augustin Coupe.  [place], [place], [place]  Alphonzo Cruise, [place] Renal Navigator [phone]

## 2019-05-09 LAB — FERRITIN: Ferritin: 1210 ng/mL — ABNORMAL HIGH (ref 24–336)

## 2019-05-09 LAB — D-DIMER, QUANTITATIVE: D-Dimer, Quant: 1.03 ug/mL-FEU — ABNORMAL HIGH (ref 0.00–0.50)

## 2019-05-09 LAB — GLUCOSE, CAPILLARY
Glucose-Capillary: 192 mg/dL — ABNORMAL HIGH (ref 70–99)
Glucose-Capillary: 191 mg/dL — ABNORMAL HIGH (ref 70–99)
Glucose-Capillary: 146 mg/dL — ABNORMAL HIGH (ref 70–99)
Glucose-Capillary: 121 mg/dL — ABNORMAL HIGH (ref 70–99)

## 2019-05-09 LAB — C-REACTIVE PROTEIN: CRP: 1.9 mg/dL — ABNORMAL HIGH (ref <1.0)

## 2019-05-09 LAB — SARS CORONAVIRUS 2: SARS Coronavirus 2: DETECTED — AB

## 2019-05-09 MED ORDER — CALCIUM ACETATE (PHOS BINDER) 667 MG PO CAPS: 667.0000 mg | ORAL_CAPSULE | Freq: Three times a day (TID) | ORAL | 0 refills | Status: AC

## 2019-05-09 MED ORDER — FAMOTIDINE 20 MG PO TABS: 20.0000 mg | ORAL_TABLET | Freq: Every day | ORAL | 0 refills | Status: AC

## 2019-05-09 MED ORDER — HYDRALAZINE HCL 50 MG PO TABS
50.0000 mg | ORAL_TABLET | Freq: Two times a day (BID) | ORAL | Status: DC
  Administered 2019-05-09 (×2): 50 mg via ORAL
  Filled 2019-05-09 (×2): qty 1

## 2019-05-09 MED ORDER — ASCORBIC ACID 500 MG PO TABS: 500.0000 mg | ORAL_TABLET | Freq: Every day | ORAL | 0 refills | Status: AC

## 2019-05-09 MED ORDER — HYDRALAZINE HCL 50 MG PO TABS: 50.0000 mg | ORAL_TABLET | Freq: Two times a day (BID) | ORAL | 0 refills | Status: DC

## 2019-05-09 MED ORDER — PANTOPRAZOLE SODIUM 40 MG PO TBEC: 40.0000 mg | DELAYED_RELEASE_TABLET | Freq: Two times a day (BID) | ORAL | 0 refills | Status: AC

## 2019-05-09 MED ORDER — ZINC SULFATE 220 (50 ZN) MG PO CAPS: 220.0000 mg | ORAL_CAPSULE | Freq: Every day | ORAL | 0 refills | Status: AC

## 2019-05-09 NOTE — Progress Notes (Signed)
CSW received message from PT inquiring if family could assist patient at home with [facility] set up.   CSW reached out to patient's son, he reports he is considering both SNF option at [facility] and [facility] option. He reports he is calling family members to inquire who can assist patient at home if needed. He reports he will call CSW back shortly as they are still deciding on what they would like to do.    CSW will continue to follow for discharge planning.   [place], [place]

## 2019-05-09 NOTE — Progress Notes (Signed)
CSW received call back from patient's son. He reports family would like to wait until we get latest COVID test results and decide on discharge plan potentially Monday.   If positive, they will re assess him going to [place] (through [place], already has dialysis set up).   If negative, can go home or to [facility] in [place].   Please advise family with results of latest COVID test when obtained.   [place], [place]

## 2019-05-09 NOTE — Discharge Summary (Addendum)
Discharge Summary  Cory Miller WPY:[id number] DOB: 10-05-1941  PCP: [name], MD  Admit date: 04/12/2019 Discharge date: 05/09/2019  Time spent: 35 minutes  Recommendations for Outpatient Follow-up:  1. Continue hemodialysis as scheduled 2. Take medications as prescribed 3. Continue physical therapy 4. 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    Discharge Condition: Stable  Diet recommendation: Resume previous diet  Vitals:   05/09/19 0200 05/09/19 0800  BP: (!) 144/102 (!) 157/71  Pulse: (!) 56   Resp: 18   Temp: 97.9 F (36.6 C) 98.4 F (36.9 C)  SpO2: 96% 96%    History of present illness:  Brief Narrative:77 y.o.malewith medical history significant ofHTN, HLD, CADs/pCABG, ESRD 23on HD(M/W/F), DM type II, CVA, GERD, and hard of hearing; who [facility] with complaints of fever, chills, cough, and shortness of breath approximately 2 weeks. Reports having fevers up to 101 F at home. Presented to [facility] ER, chest x-ray noted basilar congestion without any focal airspace opacity. COVID-19 testing was positive [place] on 5/15, transferred to [facility], due to need for hemodialysis. -With supportive care oxygen and IV steroids initially -Followed by nephrology, continued hemodialysis.  Patienthas been accepted at [facility] on a TTS schedule with a seat time of 12:30pm for the COVID + shift. He needs to arrive at  12:15pm. His SNF [facility] in [place] has confirmed that they will provide transportation for patient to OP HD treatment.    05/09/19: Patient seen and examined at his bedside.  Eating breakfast.  Denies chest pain, dyspnea or palpitations.  Denies nausea or abdominal pain.  Has no new concerns.  On the day of discharge, the patient was hemodynamically stable.  He will need to follow-up with his primary care provider and continue hemodialysis as scheduled.  He will also need to continue physical therapy and apply fall precautions.   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 -Treated with IV steroids x5 days earlier this hospital stay which was completed on 5/28 -Remdesivir contraindicated in ESRD -Increase activity, ambulation, physical therapy, incentive spirometer -Initial positive covid-19 test was on 04/11/19, positive again on 04/24/19. Repeat COVID from 04/30/19 not detected. Repeat covid-19 test on 05/03/19 positive, repeat COVID-19 testing on 05/09/2019 positive.  ESRD on HD now TTS -Continue renal diet -Nephrology following -Patient has an HD spot per nephrology accepted at [facility] dialysis center on a Tuesday Thursday Saturday schedule with a seat time of 12:30 PM for the COVID-19 positive shift.  He needs to arrive at 12:15 PM.  His SNF [facility] has confirmed that they will provide transportation for patient to outpatient HD treatment per Nephrology.  Patient  can start first outpatient treatment on Saturday, 05/10/2019.  Asymptomatic bacteriuria UA urine positive for large leukocytes with >50 WBCs. UCx: Negative.   HTN His home dose of clonidine Coreg and amlodipine are being held to avoid hypotension. Intermittent bradycardia likely from Coreg clonidine and amlodipine Restarted hydralazine 50 mg twice daily Follow-up with your PCP  Diabetes mellitus type 2 uncontrolled with hyperglycemia with renal complication -Hemoglobin A1c 7.8 on 04/25/2019 -Avoid hypoglycemia in the state of ESRD -Continue insulin coverage -Follow-up with your PCP  Anemia of chronic disease No sign of overt bleeding Continue to monitor  History ofembolicCVA -Continue Plavix and statin  Deconditioning PT/OT consultedrecommends SNF. Continue physical therapy Fall precautions  GERD Continue Pepcid, Maalox.   Code Status:FULL   Consultants:  Nephrology  Palliative care   Antimicrobials:  Rocephin, completed course.     Discharge Exam: BP (!) 157/71 (BP Location: Left Arm)    Pulse (!) 56    Temp 98.4 F (36.9 C) (Oral)    Resp 18    Ht 6\' 1"  (1.854 m)    Wt 111.5 kg    SpO2 96%    BMI 32.43 kg/m   General: 78 y.o. year-old male well developed well nourished in no acute distress.  Alert and oriented x4.  Very hard of hearing.  Cardiovascular: Regular rate and rhythm with no rubs or gallops.  No thyromegaly or JVD noted.    Respiratory: Clear to auscultation with no wheezes or rales. Good inspiratory effort.  Abdomen: Obese nontender on palpation with normal bowel sounds x4 quadrants.  Musculoskeletal: No lower extremity edema. 2/4 pulses in all 4 extremities.  Psychiatry: Mood is appropriate for condition and setting  Discharge Instructions You were cared for by a hospitalist during your hospital stay. If you have any questions about your discharge medications or the care you received while you were in the hospital after  you are discharged, you can call the unit and asked to speak with the hospitalist on call if the hospitalist that took care of you is not available. Once you are discharged, your primary care physician will handle any further medical issues. Please note that NO REFILLS for any discharge medications will be authorized once you are discharged, as it is imperative that you return to your primary care physician (or establish a relationship with a primary care physician if you do not have one) for your aftercare needs so that they can reassess your need for medications and monitor your lab values.   Allergies as of 05/09/2019   No Known Allergies     Medication List    STOP taking these medications   amLODipine 10 MG tablet Commonly known as: NORVASC   carvedilol 6.25 MG tablet Commonly known as: COREG   cloNIDine 0.1 MG tablet Commonly known as: CATAPRES   enalapril 2.5 MG tablet Commonly known as: VASOTEC   isosorbide mononitrate 120 MG 24 hr tablet Commonly known as: IMDUR   oxyCODONE-acetaminophen 5-325 MG tablet Commonly known as: PERCOCET/ROXICET   simethicone 80 MG chewable tablet Commonly known as: MYLICON   torsemide 20 MG tablet Commonly known as: DEMADEX     TAKE these medications   allopurinol 100 MG tablet Commonly known as: ZYLOPRIM Take 100 mg by mouth daily.   ascorbic acid 500 MG tablet Commonly known as: VITAMIN C Take 1 tablet (500 mg total) by  mouth daily.   aspirin EC 81 MG tablet Take 81 mg by mouth daily.   atorvastatin 40 MG tablet Commonly known as: LIPITOR Take 40 mg by mouth at bedtime.   calcium acetate 667 MG capsule Commonly known as: PHOSLO Take 1 capsule (667 mg total) by mouth 3 (three) times daily with meals.   cholecalciferol 25 MCG (1000 UT) tablet Commonly known as: VITAMIN D Take 1,000 Units by mouth daily.   clopidogrel 75 MG tablet Commonly known as: PLAVIX Take 75 mg by mouth daily.   colchicine 0.6 MG tablet Take 0.6  mg by mouth every other day.   docusate sodium 100 MG capsule Commonly known as: COLACE Take 100 mg by mouth 2 (two) times daily.   dorzolamide-timolol 22.3-6.8 MG/ML ophthalmic solution Commonly known as: COSOPT Place 1 drop into both eyes 2 (two) times daily.   EQL Flaxseed Oil 1200 MG Caps Take 1,200 mg by mouth daily.   famotidine 20 MG tablet Commonly known as: PEPCID Take 1 tablet (20 mg total) by mouth daily. Start taking on: May 10, 2019   hydrALAZINE 50 MG tablet Commonly known as: APRESOLINE Take 1 tablet (50 mg total) by mouth 2 (two) times a day. What changed:   medication strength  how much to take  when to take this   hydrOXYzine 25 MG tablet Commonly known as: ATARAX/VISTARIL Take 25 mg by mouth 4 (four) times daily as needed for itching.   insulin aspart 100 UNIT/ML FlexPen Commonly known as: NOVOLOG Inject 10 Units into the skin 3 (three) times daily with meals.   insulin detemir 100 UNIT/ML injection Commonly known as: LEVEMIR Inject 0.1 mLs (10 Units total) into the skin daily. What changed: when to take this   ketoconazole 2 % shampoo Commonly known as: NIZORAL Apply 1 application topically 2 (two) times a week.   multivitamin Tabs tablet Take 1 tablet by mouth at bedtime.   nitroGLYCERIN 0.4 MG SL tablet Commonly known as: NITROSTAT Place 0.4 mg under the tongue every 5 (five) minutes as needed for chest pain.   pantoprazole 40 MG tablet Commonly known as: Protonix Take 1 tablet (40 mg total) by mouth 2 (two) times daily.   sodium bicarbonate 650 MG tablet Take 650 mg by mouth 2 (two) times daily.   SPS 15 GM/60ML suspension Generic drug: sodium polystyrene Take 60 mLs by mouth as directed. When eating potassium rich foods   triamcinolone cream 0.1 % Commonly known as: KENALOG Apply 1 application topically 2 (two) times daily as needed (ITCHING).   zinc sulfate 220 (50 Zn) MG capsule Take 1 capsule (220 mg total) by mouth  daily. Start taking on: May 10, 2019            Durable Medical Equipment  (From admission, onward)         Start     Ordered   05/09/19 [phone]  For home use only DME Walker rolling  Once    Comments: 5" wheels  Question:  Patient needs a walker to treat with the following condition  Answer:  Ambulatory dysfunction   05/09/19 0650         No Known Allergies  Contact information for follow-up providers    [name], MD. Call in 1 day(s).   Specialty: Obstetrics and Gynecology Why: please call for a post hospital follow up appointment with your PCP. Contact information: [address] [place] [address] [phone]  Contact information for after-discharge care    Destination    [facility] .   Service: Skilled Nursing Contact information: [address] [place] [phone]                   The results of significant diagnostics from this hospitalization (including imaging, microbiology, ancillary and laboratory) are listed below for reference.    Significant Diagnostic Studies: Dg Chest 1 View  Result Date: 04/11/2019 CLINICAL DATA:  Cough and fever. EXAM: CHEST  1 VIEW COMPARISON:  10/19/2018 FINDINGS: Post median sternotomy. Chronic cardiomegaly without significant change from prior. Vascular congestion with resolved pulmonary edema from prior exam. No focal airspace disease, pleural effusion, or pneumothorax. Calcified diaphragmatic plaques most evident on the right. IMPRESSION: Chronic cardiomegaly with vascular congestion. No focal airspace opacity. Electronically Signed   By: Keith Rake M.D.   On: 04/11/2019 19:58   Dg Chest Port 1 View  Result Date: 04/19/2019 CLINICAL DATA:  Shortness of breath EXAM: PORTABLE CHEST 1 VIEW COMPARISON:  Chest x-ray dated 04/17/2019 and chest x-ray dated 10/19/2018. FINDINGS: Stable cardiomegaly. Median sternotomy wires appear stable in alignment.  Persistent bibasilar opacities, likely atelectasis and/or small pleural effusions. No new lung findings. IMPRESSION: Stable chest x-ray. Persistent bibasilar opacities, likely atelectasis and/or small pleural effusions. Electronically Signed   By: Franki Cabot M.D.   On: 04/19/2019 13:09   Dg Chest Port 1 View  Result Date: 04/17/2019 CLINICAL DATA:  Shortness of breath EXAM: PORTABLE CHEST 1 VIEW COMPARISON:  04/11/2019 FINDINGS: Previous coronary bypass changes. Similar cardiomegaly with low lung volumes and basilar atelectasis. Suspect small pleural effusions layering posteriorly. No pneumothorax. Aorta atherosclerotic. Trachea is midline. IMPRESSION: Stable cardiomegaly, low lung volumes and basilar atelectasis. Suspect small pleural effusions. Electronically Signed   By: Jerilynn Mages.  Shick M.D.   On: 04/17/2019 09:15   Dg Abd Portable 1v  Result Date: 04/19/2019 CLINICAL DATA:  RIGHT lower quadrant abdominal pain. EXAM: PORTABLE ABDOMEN - 1 VIEW COMPARISON:  None. FINDINGS: Visualized bowel gas pattern is nonobstructive. No evidence of soft tissue mass or abnormal fluid collection. No evidence of free intraperitoneal air. No evidence of renal or ureteral calculi. Osseous structures are unremarkable. IMPRESSION: Negative. Electronically Signed   By: Franki Cabot M.D.   On: 04/19/2019 13:10    Microbiology: Recent Results (from the past 240 hour(s))  Novel Coronavirus, NAA (hospital order; send-out to ref lab)     Status: None   Collection Time: 04/30/19 12:57 PM   Specimen: Nasopharyngeal Swab; Respiratory  Result Value Ref Range Status   SARS-CoV-2, NAA NOT DETECTED NOT DETECTED Final    Comment: (NOTE) This test was developed and its performance characteristics determined by [employer]. This test has not been FDA cleared or approved. This test has been authorized by FDA under an Emergency Use Authorization (EUA). This test is only authorized for the duration of time the declaration  that circumstances exist justifying the authorization of the emergency use of in vitro diagnostic tests for detection of SARS-CoV-2 virus and/or diagnosis of COVID-19 infection under section 564(b)(1) of the Act, 21 U.S.C. 149FWY-6(V)(7), unless the authorization is terminated or revoked sooner. When diagnostic testing is negative, the possibility of a false negative result should be considered in the context of a patient's recent exposures and the presence of clinical signs and symptoms consistent with COVID-19. An individual without symptoms of COVID-19 and who is not shedding SARS-CoV-2 virus would expect to have a  negative (not detected) result in this assay. Performed  At: [facility] [address] [place], [place] [id number] Rush Farmer MD TI:[id number]    [place]  Final    Comment: Performed at [facility], [place] [address], [place], [place] [address]  SARS Coronavirus 2     Status: Abnormal   Collection Time: 05/03/19  6:24 AM  Result Value Ref Range Status   SARS Coronavirus 2 DETECTED (A) NOT DETECTED Final    Comment: CRITICAL RESULT CALLED TO, READ BACK BY AND VERIFIED WITH: RN Alger Simons B. [id number] [id number] FCP (NOTE) SARS-CoV-2 target nucleic acids are DETECTED. The SARS-CoV-2 RNA is generally detectable in upper and lower respiratory specimens during the acute phase of infection. Positive results are indicative of active infection with SARS-CoV-2. Clinical  correlation with patient history and other diagnostic information is necessary to determine patient infection status. Positive results do  not rule out bacterial infection or co-infection with other viruses. The expected result is Not Detected. Fact Sheet for Patients: http://www.biofiredefense.com/wp-content/uploads/2020/03/BIOFIRE-COVID -19-patients.pdf Fact Sheet for Healthcare  Providers: http://www.biofiredefense.com/wp-content/uploads/2020/03/BIOFIRE-COVID -19-hcp.pdf This test is not yet approved or cleared by the [place] and  has been authorized for detection and/or diagnosis of SARS-CoV-2 by FDA under an Emergency  Use Authorization (EUA).  This EUA will remain in effect (meaning this test can be used) for the duration of  the COVID-19 declaration under Section 564(b)(1) of the Act, 21 U.S.C. section [phone] 3(b)(1), unless the authorization is terminated or revoked sooner. Performed at [facility], [place] [address] [place], [place] [address]   SARS Coronavirus 2     Status: Abnormal   Collection Time: 05/09/19 12:33 PM  Result Value Ref Range Status   SARS Coronavirus 2 DETECTED (A) NOT DETECTED Final    Comment: RESULT CALLED TO, READ BACK BY AND VERIFIED WITH: [place] Nurse RN 14:40 05/09/19 (wilsonm) (NOTE) SARS-CoV-2 target nucleic acids are DETECTED. The SARS-CoV-2 RNA is generally detectable in upper and lower respiratory specimens during the acute phase of infection. Positive results are indicative of active infection with SARS-CoV-2. Clinical  correlation with patient history and other diagnostic information is necessary to determine patient infection status. Positive results do  not rule out bacterial infection or co-infection with other viruses. The expected result is Not Detected. Fact Sheet for Patients: http://www.biofiredefense.com/wp-content/uploads/2020/03/BIOFIRE-COVID -19-patients.pdf Fact Sheet for Healthcare Providers: http://www.biofiredefense.com/wp-content/uploads/2020/03/BIOFIRE-COVID -19-hcp.pdf This test is not yet approved or cleared by the [place] and  has been authorized for detection and/or diagnosis of SARS-CoV-2 by FDA under an Emergency  Use Authorization (EUA).  This EUA will remain in effect (meaning this test can be used) for the duration of  the COVID-19 declaration under Section  564(b)(1) of the Act, 21 U.S.C. section [phone] 3(b)(1), unless the authorization is terminated or revoked sooner. Performed at [facility], [place] [address] [place], [place] [address]      Labs: Basic Metabolic Panel: Recent Labs  Lab 05/05/19 1416 05/07/19 0700  NA 137 136  K 4.6 4.3  CL 99 96*  CO2 23 25  GLUCOSE 243* 162*  BUN 68* 57*  CREATININE 9.57* 8.26*  CALCIUM 8.5* 8.8*  PHOS 3.2 4.0   Liver Function Tests: Recent Labs  Lab 05/05/19 1416 05/07/19 0700  ALBUMIN 2.4* 2.6*   No results for input(s): LIPASE, AMYLASE in the last 168 hours. No results for input(s): AMMONIA in the last 168 hours. CBC: Recent Labs  Lab 05/05/19 1416 05/07/19 0700  WBC 10.5 8.4  HGB 10.2* 9.8*  HCT 31.8* 29.9*  MCV 97.8 96.1  PLT 243 190   Cardiac Enzymes: No results for input(s): CKTOTAL, CKMB, CKMBINDEX, TROPONINI in the last 168 hours. BNP: BNP (last 3 results) Recent Labs    08/22/18 1256 10/18/18 0611 04/12/19 1513  BNP 903.0* 841.0*   841.0* 1,683.1*    ProBNP (last 3 results) No results for input(s): PROBNP in the last 8760 hours.  CBG: Recent Labs  Lab 05/08/19 1331 05/08/19 1657 05/08/19 2127 05/09/19 0821 05/09/19 1232  GLUCAP 214* 133* 125* 192* 191*       Signed:  Kayleen Memos, MD [facility] 05/09/2019, 3:40 PM

## 2019-05-09 NOTE — Progress Notes (Signed)
[facility] Progress Note    Assessment/ Plan:   [place] [phone]  MWF, [id number] [id number] AVF 4 hours EDW 116kg2K/3Ca  Assessment/Plan: 1. covid-19 pneumonia-Repeatswabneg from 6/3. 6/6 send out was positive.  2. ESRD- cont with HD qMWFvia AVF- cont on schedule. He ismuch below edw and BP's stable. Continue to challenge edw to help with hypoxia- only on low dose coreg.   Plan on HD Sat; see disp for explanation.  3. Diarrhea and abdominal pain- workup per primary svc 4. 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 SHPTH- stable- no binder listed on home med list- phos was starting to creep up- added phoslo - PTH 337- no meds for now 6. Second degree heart block- per primary 7. Dispo- appreciate the hard work of [employer]; pt has been accepted at [facility] on a TTS schedule with a seat time of 12:30pm for the COVID + shift. He needs to arrive at 12:15pm. His SNF [facility] in [place] has confirmed that they will provide transportation for patient to OP HD treatment.   Patient family angry and appears to be refusing transfer to the facility.  Will plan on HD Sat if pt is still here   @ [facility] on TTS regimen. Otherwise from renal standpoint he can be transferred to [facility] in [place] and start outpt HD this Sat; his last HD was Wed.  8. Renal Navigator notified CSW/[name], CM/D. Lovena Le and Nephrologist/Dr. Augustin Coupe.  Subjective:   He's been suctioning himself for secretions  Family refusing for pt to go to the facility.   Objective:   BP (!) 157/71 (BP Location: Left Arm)   Pulse (!) 56   Temp 98.4 F (36.9 C) (Oral)   Resp 18   Ht 6\' 1"  (1.854 m)   Wt 111.5 kg   SpO2 96%   BMI 32.43 kg/m   Intake/Output Summary (Last 24 hours) at 05/09/2019 0913 Last data filed at 05/08/2019 2130 Gross per 24 hour  Intake 723 ml  Output -  Net 723 ml   Weight change: -0.9  kg  Physical Exam: Direct physical contact not performed out of concern for covid-19 infection and utilizing exam documented by primary team and RN documentation.  Imaging: No results found.  Labs: BMET Recent Labs  Lab 05/05/19 1416 05/07/19 0700  NA 137 136  K 4.6 4.3  CL 99 96*  CO2 23 25  GLUCOSE 243* 162*  BUN 68* 57*  CREATININE 9.57* 8.26*  CALCIUM 8.5* 8.8*  PHOS 3.2 4.0   CBC Recent Labs  Lab 05/05/19 1416 05/07/19 0700  WBC 10.5 8.4  HGB 10.2* 9.8*  HCT 31.8* 29.9*  MCV 97.8 96.1  PLT 243 190    Medications:    . atorvastatin  40 mg Oral QHS  . benzonatate  100 mg Oral TID  . calcium acetate  667 mg Oral TID WC  . Chlorhexidine Gluconate Cloth  6 each Topical Q0600  . clopidogrel  75 mg Oral Daily  . dorzolamide-timolol  1 drop Both Eyes BID  . famotidine  20 mg Oral Daily  . heparin  7,500 Units Subcutaneous Q8H  . hydrALAZINE  50 mg Oral BID  . insulin aspart  0-15 Units Subcutaneous TID WC  . insulin aspart  8 Units Subcutaneous TID WC  . insulin detemir  15 Units Subcutaneous QHS  . pantoprazole  40 mg Oral BID  . sodium chloride  flush  3 mL Intravenous Q12H  . vitamin C  500 mg Oral Daily  . zinc sulfate  220 mg Oral Daily      Otelia Santee, MD 05/09/2019, 9:13 AM

## 2019-05-09 NOTE — Progress Notes (Signed)
Physical Therapy Treatment Patient Details Name: Cory Miller MRN: [id number] DOB: 08-Oct-1941 Today's Date: 05/09/2019    History of Present Illness Cory Miller is a 78 y.o. male with medical history significant of HTN, HLD, CAD  s/p CABG, ESRD on HD(M/W/F), DM type II, CVA, GERD, and hard of hearing; who presented to [facility] with complaints of fever, chills, cough, and shortness of breath approximately 2 weeks.  Reports having fevers up to 101 F at home. found to be Covid 19 +    PT Comments    Pt making very good progress. Pt ambulating in room with only supervision. Amb back in forth in room multiple times for 300'. Unable to amb in hallway due to Covid precautions. Pt is likely too high level for CIR and would have to be Covid negative. Likely his activity tolerance varies with his HD and doubt he could manage everything at home alone.  Therefore will continue to recommend SNF.   Follow Up Recommendations  SNF     Equipment Recommendations  Rolling walker with 5" wheels    Recommendations for Other Services       Precautions / Restrictions Precautions Precautions: Fall Precaution Comments: Fall risk low Restrictions Weight Bearing Restrictions: No    Mobility  Bed Mobility Overal bed mobility: Needs Assistance Bed Mobility: Supine to Sit     Supine to sit: Modified independent (Device/Increase time)     General bed mobility comments: Inc time  Transfers Overall transfer level: Needs assistance Equipment used: Straight cane(with wide tip) Transfers: Sit to/from Stand Sit to Stand: Supervision         General transfer comment: Supervision for safety. Stood from bed at lowest height and stood from commode using grab bar  Ambulation/Gait Ambulation/Gait assistance: Scientist, forensic (Feet): 300 Feet Assistive device: Straight cane Gait Pattern/deviations: Step-through pattern;Antalgic;Decreased stride length Gait velocity:  decr Gait velocity interpretation: 1.31 - 2.62 ft/sec, indicative of limited community ambulator General Gait Details: Pt ambulated back and forth in the room with supervision. Amb to bathroom and washed hands and periarea standing at sink with supervision.   Stairs             Wheelchair Mobility    Modified Rankin (Stroke Patients Only)       Balance Overall balance assessment: Needs assistance Sitting-balance support: No upper extremity supported;Feet supported Sitting balance-Leahy Scale: Good     Standing balance support: During functional activity;No upper extremity supported Standing balance-Leahy Scale: Fair Standing balance comment: supervision with dynamic activity                            Cognition Arousal/Alertness: Awake/alert Behavior During Therapy: WFL for tasks assessed/performed Overall Cognitive Status: Difficult to assess                                 General Comments: Pt responds to written questions appropriately.       Exercises      General Comments General comments (skin integrity, edema, etc.): Pt on RA with SpO2 95% with activity      Pertinent Vitals/Pain Pain Assessment: No/denies pain    Home Living                      Prior Function            PT Goals (current goals can now  be found in the care plan section) Progress towards PT goals: Progressing toward goals    Frequency    Min 3X/week      PT Plan Discharge plan needs to be updated    Co-evaluation              AM-PAC PT "6 Clicks" Mobility   Outcome Measure  Help needed turning from your back to your side while in a flat bed without using bedrails?: None Help needed moving from lying on your back to sitting on the side of a flat bed without using bedrails?: None Help needed moving to and from a bed to a chair (including a wheelchair)?: A Little Help needed standing up from a chair using your arms (e.g.,  wheelchair or bedside chair)?: A Little Help needed to walk in hospital room?: A Little Help needed climbing 3-5 steps with a railing? : A Little 6 Click Score: 20    End of Session Equipment Utilized During Treatment: Gait belt Activity Tolerance: Patient tolerated treatment well Patient left: in bed;with call bell/phone within reach;with bed alarm set Nurse Communication: Mobility status PT Visit Diagnosis: Difficulty in walking, not elsewhere classified (R26.2)     Time: 4196-2229 PT Time Calculation (min) (ACUTE ONLY): 32 min  Charges:  $Gait Training: 23-37 mins                     [place] Pager [phone] Office [place] 05/09/2019, 12:09 PM

## 2019-05-09 NOTE — Discharge Instructions (Addendum)
Shortness of Breath, Adult  Shortness of breath means you have trouble breathing. Shortness of breath could be a sign of a medical problem.  Follow these instructions at home:     Watch for any changes in your symptoms.   Do not use any products that contain nicotine or tobacco, such as cigarettes, e-cigarettes, and chewing tobacco.   Do not smoke. Smoking can cause shortness of breath. If you need help to quit smoking, ask your doctor.   Avoid things that can make it harder to breathe, such as:  ? Mold.  ? Dust.  ? Air pollution.  ? Chemical smells.  ? Things that can cause allergy symptoms (allergens), if you have allergies.   Keep your living space clean. Use products that help remove mold and dust.   Rest as needed. Slowly return to your normal activities.   Take over-the-counter and prescription medicines only as told by your doctor. This includes oxygen therapy and inhaled medicines.   Keep all follow-up visits as told by your doctor. This is important.  Contact a doctor if:   Your condition does not get better as soon as expected.   You have a hard time doing your normal activities, even after you rest.   You have new symptoms.  Get help right away if:   Your shortness of breath gets worse.   You have trouble breathing when you are resting.   You feel light-headed or you pass out (faint).   You have a cough that is not helped by medicines.   You cough up blood.   You have pain with breathing.   You have pain in your chest, arms, shoulders, or belly (abdomen).   You have a fever.   You cannot walk up stairs.   You cannot exercise the way you normally do.  These symptoms may represent a serious problem that is an emergency. Do not wait to see if the symptoms will go away. Get medical help right away. Call your local emergency services (911 in the [place] Do not drive yourself to the hospital.  Summary   Shortness of breath is when you have trouble breathing enough air. It can be a sign of a  medical problem.   Avoid things that make it hard for you to breathe, such as smoking, pollution, mold, and dust.   Watch for any changes in your symptoms. Contact your doctor if you do not get better or you get worse.  This information is not intended to replace advice given to you by your health care provider. Make sure you discuss any questions you have with your health care provider.  Document Released: 05/01/2008 Document Revised: 04/15/2018 Document Reviewed: 04/15/2018  Elsevier Interactive Patient Education  2019 [employer]vier Inc[employer]

## 2019-05-09 NOTE — Progress Notes (Signed)
CSW spoke with son Cory Miller who reports he is now in agreement with [facility] discharge Monday, as he realizes this is the best option and no other SNF beds avail that take COVID + and dialysis. He also reports his brother is there now also seeing what the facility looks like.   Plan for DC Monday to [facility]. Terri Piedra renal navigator and director [employer] updated as well.  [place], [place]

## 2019-05-09 NOTE — Progress Notes (Signed)
Renal Navigator has been in close communication with CSW/[name] regarding patient. Due to barriers in discharge, patient will not start at OP HD clinic tomorrow as previously planned. Renal Navigator updated OP HD clinic/[facility] and Nephrologist/Dr. Augustin Coupe.  Alphonzo Cruise, [place] Renal Navigator [phone]

## 2019-05-09 NOTE — Progress Notes (Signed)
CSW consulted with patient's family [name] and Deroy today to inform that patient's dialysis and SNF placement were found to be able to be set up at [facility]. Nurse spoke with patient yesterday 6/11 (oriented X4) who is agreeable. Richardson Landry reports having a conversation with Suanne Marker from [facility] yesterday. Richardson Landry is grateful of work CSW and Jaclyn Shaggy have done to ensure patient has placement that accomodates his needs.   Richardson Landry reports he is trying to drive to the facility today to see what it looks like to ensure he is comfortable with patient going. He reports he has reached out to over 62 facilities in [place] and has found that it is difficult to find placement for a patient that is COVID + and needing dialysis. Richardson Landry will f/u with CSW once he goes to the facility today or reviews more online. CSW emailed him an additional link to review. Richardson Landry understanding that there are no other SNF options that take CoViD + patients and can accommodate dialysis at this time.   CSW has updated Terri Piedra renal navigator and Blanca Friend on this information.   [place], [place]

## 2019-05-10 LAB — GLUCOSE, CAPILLARY
Glucose-Capillary: 148 mg/dL — ABNORMAL HIGH (ref 70–99)
Glucose-Capillary: 190 mg/dL — ABNORMAL HIGH (ref 70–99)
Glucose-Capillary: 232 mg/dL — ABNORMAL HIGH (ref 70–99)
Glucose-Capillary: 248 mg/dL — ABNORMAL HIGH (ref 70–99)

## 2019-05-10 LAB — D-DIMER, QUANTITATIVE: D-Dimer, Quant: 0.95 ug/mL-FEU — ABNORMAL HIGH (ref 0.00–0.50)

## 2019-05-10 LAB — C-REACTIVE PROTEIN: CRP: <0.8 mg/dL (ref <1.0)

## 2019-05-10 LAB — FERRITIN: Ferritin: 1035 ng/mL — ABNORMAL HIGH (ref 24–336)

## 2019-05-10 MED ORDER — HYDRALAZINE HCL 50 MG PO TABS
100.0000 mg | ORAL_TABLET | Freq: Two times a day (BID) | ORAL | Status: DC
  Administered 2019-05-10 – 2019-05-12 (×4): 100 mg via ORAL
  Filled 2019-05-10 (×4): qty 2

## 2019-05-10 MED ORDER — HYDRALAZINE HCL 100 MG PO TABS: 100.0000 mg | ORAL_TABLET | Freq: Two times a day (BID) | ORAL | 0 refills | Status: AC

## 2019-05-10 NOTE — Progress Notes (Signed)
[facility] Progress Note    Assessment/ Plan:   Dialyzes [place] [phone]  MWF, [id number] [id number] AVF 4 hours EDW 116kg2K/3Ca  Assessment/Plan: 1. covid-19 pneumonia-Repeatswabneg from 6/3. 6/6 send out was positive.  2. ESRD- HD qMWFvia AVF here in hospital but transitioning to TTS. He ismuch below edw and BP's stable. Continue to challenge edw to help with hypoxia- only on low dose coreg.   About to go on  HD but had to use restroom.  Otherwise from renal standpoint he can be transferred to [facility] in [place] and start outpt HD Tues; his last HD was Wed.  3.  Diarrhea and abdominal pain- workup per primary svc  4. Anemia of ESRD- not on ESA at present- hgb over 10- no meds  5. SHPTH- stable- no binder listed on home med list- phos was starting to creep up- added phoslo - PTH 337- started on phoslo 667 1 tab TIDM Phos 4.6  6. Second degree heart block- per primary  7. Dispo-appreciate the hard work of [employer]; pthas been accepted at [facility] on a TTS schedule with a seat time of 12:30pm for the COVID + shift. He needs to arrive at 12:15pm. His SNF [facility] in [place] has confirmed that they will provide transportation for patient to OP HD treatment.    8. Renal Navigator notified CSW/[name], CM/D. Lovena Le and Nephrologist/Dr. Augustin Coupe.  Subjective:   He's beensuctioning himselffor secretions  Diarrhea still present.   Objective:   BP 138/72 (BP Location: Left Arm)   Pulse (!) 105   Temp 97.9 F (36.6 C) (Oral)   Resp 19   Ht 6\' 1"  (1.854 m)   Wt 111.5 kg   SpO2 95%   BMI 32.43 kg/m   Intake/Output Summary (Last 24 hours) at 05/10/2019 2426 Last data filed at 05/09/2019 2233 Gross per 24 hour  Intake 603 ml  Output -  Net 603 ml   Weight change:   Physical Exam: GEN: NAD NECK: Supple, no thyromegaly LUNGS: CTA B/L  CV: RRR, No M/R/G ABD: SNDNT +BS  EXT: No  lower extremity edema ACCESS: AVF   Imaging: No results found.  Labs: BMET Recent Labs  Lab 05/05/19 1416 05/07/19 0700  NA 137 136  K 4.6 4.3  CL 99 96*  CO2 23 25  GLUCOSE 243* 162*  BUN 68* 57*  CREATININE 9.57* 8.26*  CALCIUM 8.5* 8.8*  PHOS 3.2 4.0   CBC Recent Labs  Lab 05/05/19 1416 05/07/19 0700  WBC 10.5 8.4  HGB 10.2* 9.8*  HCT 31.8* 29.9*  MCV 97.8 96.1  PLT 243 190    Medications:    . atorvastatin  40 mg Oral QHS  . benzonatate  100 mg Oral TID  . calcium acetate  667 mg Oral TID WC  . Chlorhexidine Gluconate Cloth  6 each Topical Q0600  . clopidogrel  75 mg Oral Daily  . dorzolamide-timolol  1 drop Both Eyes BID  . famotidine  20 mg Oral Daily  . heparin  7,500 Units Subcutaneous Q8H  . hydrALAZINE  100 mg Oral BID  . insulin aspart  0-15 Units Subcutaneous TID WC  . insulin aspart  8 Units Subcutaneous TID WC  . insulin detemir  15 Units Subcutaneous QHS  . pantoprazole  40 mg Oral BID  . sodium chloride flush  3 mL Intravenous Q12H  . vitamin C  500 mg Oral Daily  . zinc sulfate  220 mg  Oral Daily      Otelia Santee, MD 05/10/2019, 9:17 AM

## 2019-05-10 NOTE — Progress Notes (Addendum)
Discharge Summary  Cory Miller UVO:[id number] DOB: 25-Oct-1941  PCP: [name], MD  Admit date: 04/12/2019 Discharge date: 05/10/2019  Time spent: 35 minutes.  DC DELAYED DUE FAMILY WANTING MORE TIME PRIOR TO TRANSFER TO [facility].  Recommendations for Outpatient Follow-up:  1. Continue hemodialysis as scheduled 2. Take medications as prescribed 3. Continue physical therapy 4. 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    Discharge Condition: Stable  Diet recommendation: Resume previous diet  Vitals:   05/09/19 2232 05/10/19 0800  BP: (!) 172/77 138/72  Pulse: 97 (!) 105  Resp:  19  Temp: 97.8 F (36.6 C) 97.9 F (36.6 C)  SpO2: 97% 95%    History of present illness:  Brief Narrative:78 y.o.malewith medical history significant ofHTN, HLD, CADs/pCABG, ESRD 23on HD(M/W/F), DM type II, CVA, GERD, and hard of hearing; who [facility] with complaints of fever, chills, cough, and shortness of breath approximately 2 weeks. Reports having fevers up to 101 F at home. Presented to [facility] ER, chest x-ray noted basilar congestion without any focal airspace opacity. COVID-19 testing was positive [place] on 5/15, transferred to [facility], due to need for hemodialysis. -With supportive care oxygen and IV steroids initially -Followed by nephrology, continued hemodialysis.  Patienthas been accepted at [facility] on a TTS  schedule with a seat time of 12:30pm for the COVID + shift. He needs to arrive at 12:15pm. His SNF [facility] in [place] has confirmed that they will provide transportation for patient to OP HD treatment.   05/10/19: Patient seen and examined at his bedside.  No acute events overnight.  Vital signs reviewed and are stable.  No signs of acute distress.  No new concerns.  Planned DC to SNF on Monday, 05/12/19.   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 -Treated with IV steroids x5 days earlier this hospital stay which was completed on 5/28 -Remdesivir contraindicated in ESRD -Increase activity, ambulation, physical therapy, incentive spirometer -Initial positive covid-19 test was on 04/11/19, positive again on 04/24/19. Repeat COVID from 04/30/19 not detected. Repeat covid-19 test on 05/03/19 positive, repeat COVID-19 testing on 05/09/2019 positive.  ESRD on HD now TTS -Continue renal diet -Nephrology following -Patient has an HD spot per nephrology accepted at [facility] dialysis center on a Tuesday Thursday Saturday schedule with a seat time of 12:30 PM for the COVID-19 positive shift.  He needs to arrive at 12:15 PM.  His SN[facility] has confirmed that they will provide transportation for patient to outpatient HD treatment  per Nephrology.  Patient can start first outpatient treatment on Saturday, 05/10/2019.  Asymptomatic bacteriuria UA urine positive for large leukocytes  with >50 WBCs. UCx: Negative.   Uncontrolled HTN Due to intermittent bradycardia continue to hold Coreg, clonidine and amlodipine Was restarted on hydralazine yesterday 50 mg twice daily, increase the dose to 100 mg twice daily. C/w close monitoring of vital signs.   Diabetes mellitus type 2 uncontrolled with hyperglycemia with renal complication -Hemoglobin A1c 7.8 on 04/25/2019 -Avoid hypoglycemia in the state of ESRD -Continue insulin coverage  Anemia of chronic disease No sign of overt bleeding Continue to monitor  History ofembolicCVA -Continue Plavix and statin  Deconditioning PT/OT consultedrecommends SNF. Continue physical therapy Fall precautions  GERD Continue Pepcid, Maalox.   Code Status:FULL   Consultants:  Nephrology  Palliative care   Antimicrobials:  Rocephin, completed course.     Discharge Exam: BP 138/72 (BP Location: Left Arm)    Pulse (!) 105    Temp 97.9 F (36.6 C) (Oral)    Resp 19    Ht 6\' 1"  (1.854 m)    Wt 111.5 kg    SpO2 95%    BMI 32.43 kg/m   General: 78 y.o. year-old male  Wd wn nad  Cardiovascular: RRR no rubs or gallops. No JVD  Respiratory: CTA, no wheezes.  Abdomen: Obese NT ND NBS x4  Musculoskeletal: No LE edema.  Discharge Instructions You were cared for by a hospitalist during your hospital stay. If you have any questions about your discharge medications or the care you received while you were in the hospital after you are discharged, you can call the unit and asked to speak with the hospitalist on call if the hospitalist that took care of you is not available. Once you are discharged, your primary care physician will handle any further medical issues. Please note that NO REFILLS for any discharge medications will be authorized once you are discharged, as it is imperative that you return to your primary care physician (or establish a relationship with a primary care physician if you do not have one)  for your aftercare needs so that they can reassess your need for medications and monitor your lab values.  Discharge Instructions    MyChart COVID-19 home monitoring program   Complete by: May 10, 2019    Is the patient willing to use the [facility] for home monitoring?: Yes   Temperature monitoring   Complete by: May 10, 2019    After how many days would you like to receive a notification of this patient's flowsheet entries?: 1     Allergies as of 05/10/2019   No Known Allergies     Medication List    STOP taking these medications   amLODipine 10 MG tablet Commonly known as: NORVASC   carvedilol 6.25 MG tablet Commonly known as: COREG   cloNIDine 0.1 MG tablet Commonly known as: CATAPRES   enalapril 2.5 MG tablet Commonly known as: VASOTEC   isosorbide mononitrate 120 MG 24 hr tablet Commonly known as: IMDUR   oxyCODONE-acetaminophen 5-325 MG tablet Commonly known as: PERCOCET/ROXICET   simethicone 80 MG chewable tablet Commonly known as: MYLICON   torsemide 20 MG tablet Commonly known as: DEMADEX     TAKE these medications   allopurinol 100 MG tablet Commonly known as: ZYLOPRIM Take 100 mg by mouth daily.   ascorbic acid 500 MG tablet Commonly known as: VITAMIN C Take 1 tablet (500 mg total) by mouth daily.  aspirin EC 81 MG tablet Take 81 mg by mouth daily.   atorvastatin 40 MG tablet Commonly known as: LIPITOR Take 40 mg by mouth at bedtime.   calcium acetate 667 MG capsule Commonly known as: PHOSLO Take 1 capsule (667 mg total) by mouth 3 (three) times daily with meals.   cholecalciferol 25 MCG (1000 UT) tablet Commonly known as: VITAMIN D Take 1,000 Units by mouth daily.   clopidogrel 75 MG tablet Commonly known as: PLAVIX Take 75 mg by mouth daily.   colchicine 0.6 MG tablet Take 0.6 mg by mouth every other day.   docusate sodium 100 MG capsule Commonly known as: COLACE Take 100 mg by mouth 2 (two) times daily.     dorzolamide-timolol 22.3-6.8 MG/ML ophthalmic solution Commonly known as: COSOPT Place 1 drop into both eyes 2 (two) times daily.   EQL Flaxseed Oil 1200 MG Caps Take 1,200 mg by mouth daily.   famotidine 20 MG tablet Commonly known as: PEPCID Take 1 tablet (20 mg total) by mouth daily.   hydrALAZINE 100 MG tablet Commonly known as: APRESOLINE Take 1 tablet (100 mg total) by mouth 2 (two) times a day. What changed: when to take this   hydrOXYzine 25 MG tablet Commonly known as: ATARAX/VISTARIL Take 25 mg by mouth 4 (four) times daily as needed for itching.   insulin aspart 100 UNIT/ML FlexPen Commonly known as: NOVOLOG Inject 10 Units into the skin 3 (three) times daily with meals.   insulin detemir 100 UNIT/ML injection Commonly known as: LEVEMIR Inject 0.1 mLs (10 Units total) into the skin daily. What changed: when to take this   ketoconazole 2 % shampoo Commonly known as: NIZORAL Apply 1 application topically 2 (two) times a week.   multivitamin Tabs tablet Take 1 tablet by mouth at bedtime.   nitroGLYCERIN 0.4 MG SL tablet Commonly known as: NITROSTAT Place 0.4 mg under the tongue every 5 (five) minutes as needed for chest pain.   pantoprazole 40 MG tablet Commonly known as: Protonix Take 1 tablet (40 mg total) by mouth 2 (two) times daily.   sodium bicarbonate 650 MG tablet Take 650 mg by mouth 2 (two) times daily.   SPS 15 GM/60ML suspension Generic drug: sodium polystyrene Take 60 mLs by mouth as directed. When eating potassium rich foods   triamcinolone cream 0.1 % Commonly known as: KENALOG Apply 1 application topically 2 (two) times daily as needed (ITCHING).   zinc sulfate 220 (50 Zn) MG capsule Take 1 capsule (220 mg total) by mouth daily.            Durable Medical Equipment  (From admission, onward)         Start     Ordered   05/09/19 0651  For home use only DME Walker rolling  Once    Comments: 5" wheels  Question:  Patient  needs a walker to treat with the following condition  Answer:  Ambulatory dysfunction   05/09/19 0650         No Known Allergies  Contact information for follow-up providers    [name], MD. Call in 1 day(s).   Specialty: Obstetrics and Gynecology Why: please call for a post hospital follow up appointment with your PCP. Contact information: [address] [place] [address] [phone]            Contact information for after-discharge care    Destination    [facility] .   Service: Skilled Nursing  Contact information: [address] [place] [phone]                   The results of significant diagnostics from this hospitalization (including imaging, microbiology, ancillary and laboratory) are listed below for reference.    Significant Diagnostic Studies: Dg Chest 1 View  Result Date: 04/11/2019 CLINICAL DATA:  Cough and fever. EXAM: CHEST  1 VIEW COMPARISON:  10/19/2018 FINDINGS: Post median sternotomy. Chronic cardiomegaly without significant change from prior. Vascular congestion with resolved pulmonary edema from prior exam. No focal airspace disease, pleural effusion, or pneumothorax. Calcified diaphragmatic plaques most evident on the right. IMPRESSION: Chronic cardiomegaly with vascular congestion. No focal airspace opacity. Electronically Signed   By: Keith Rake M.D.   On: 04/11/2019 19:58   Dg Chest Port 1 View  Result Date: 04/19/2019 CLINICAL DATA:  Shortness of breath EXAM: PORTABLE CHEST 1 VIEW COMPARISON:  Chest x-ray dated 04/17/2019 and chest x-ray dated 10/19/2018. FINDINGS: Stable cardiomegaly. Median sternotomy wires appear stable in alignment. Persistent bibasilar opacities, likely atelectasis and/or small pleural effusions. No new lung findings. IMPRESSION: Stable chest x-ray. Persistent bibasilar opacities, likely atelectasis and/or small pleural effusions. Electronically Signed    By: Franki Cabot M.D.   On: 04/19/2019 13:09   Dg Chest Port 1 View  Result Date: 04/17/2019 CLINICAL DATA:  Shortness of breath EXAM: PORTABLE CHEST 1 VIEW COMPARISON:  04/11/2019 FINDINGS: Previous coronary bypass changes. Similar cardiomegaly with low lung volumes and basilar atelectasis. Suspect small pleural effusions layering posteriorly. No pneumothorax. Aorta atherosclerotic. Trachea is midline. IMPRESSION: Stable cardiomegaly, low lung volumes and basilar atelectasis. Suspect small pleural effusions. Electronically Signed   By: Jerilynn Mages.  Shick M.D.   On: 04/17/2019 09:15   Dg Abd Portable 1v  Result Date: 04/19/2019 CLINICAL DATA:  RIGHT lower quadrant abdominal pain. EXAM: PORTABLE ABDOMEN - 1 VIEW COMPARISON:  None. FINDINGS: Visualized bowel gas pattern is nonobstructive. No evidence of soft tissue mass or abnormal fluid collection. No evidence of free intraperitoneal air. No evidence of renal or ureteral calculi. Osseous structures are unremarkable. IMPRESSION: Negative. Electronically Signed   By: Franki Cabot M.D.   On: 04/19/2019 13:10    Microbiology: Recent Results (from the past 240 hour(s))  Novel Coronavirus, NAA (hospital order; send-out to ref lab)     Status: None   Collection Time: 04/30/19 12:57 PM   Specimen: Nasopharyngeal Swab; Respiratory  Result Value Ref Range Status   SARS-CoV-2, NAA NOT DETECTED NOT DETECTED Final    Comment: (NOTE) This test was developed and its performance characteristics determined by [employer]. This test has not been FDA cleared or approved. This test has been authorized by FDA under an Emergency Use Authorization (EUA). This test is only authorized for the duration of time the declaration that circumstances exist justifying the authorization of the emergency use of in vitro diagnostic tests for detection of SARS-CoV-2 virus and/or diagnosis of COVID-19 infection under section 564(b)(1) of the Act, 21 U.S.C. 983JAS-5(K)(5),  unless the authorization is terminated or revoked sooner. When diagnostic testing is negative, the possibility of a false negative result should be considered in the context of a patient's recent exposures and the presence of clinical signs and symptoms consistent with COVID-19. An individual without symptoms of COVID-19 and who is not shedding SARS-CoV-2 virus would expect to have a negative (not detected) result in this assay. Performed  At: [facility] [place], [place] [id number] Rush Farmer MD  JW:[id number]    Coronavirus Source NASOPHARYNGEAL  Final    Comment: Performed at [facility], [place] [address], [place], [place] [address]  SARS Coronavirus 2     Status: Abnormal   Collection Time: 05/03/19  6:24 AM  Result Value Ref Range Status   SARS Coronavirus 2 DETECTED (A) NOT DETECTED Final    Comment: CRITICAL RESULT CALLED TO, READ BACK BY AND VERIFIED WITH: RN Alger Simons B. [id number] [id number] FCP (NOTE) SARS-CoV-2 target nucleic acids are DETECTED. The SARS-CoV-2 RNA is generally detectable in upper and lower respiratory specimens during the acute phase of infection. Positive results are indicative of active infection with SARS-CoV-2. Clinical  correlation with patient history and other diagnostic information is necessary to determine patient infection status. Positive results do  not rule out bacterial infection or co-infection with other viruses. The expected result is Not Detected. Fact Sheet for Patients: http://www.biofiredefense.com/wp-content/uploads/2020/03/BIOFIRE-COVID -19-patients.pdf Fact Sheet for Healthcare Providers: http://www.biofiredefense.com/wp-content/uploads/2020/03/BIOFIRE-COVID -19-hcp.pdf This test is not yet approved or cleared by the [place] and  has been authorized for detection and/or diagnosis of SARS-CoV-2 by FDA under an Emergency  Use Authorization (EUA).  This EUA will remain in effect (meaning this test  can be used) for the duration of  the COVID-19 declaration under Section 564(b)(1) of the Act, 21 U.S.C. section [phone] 3(b)(1), unless the authorization is terminated or revoked sooner. Performed at [facility], [place] [address], [place], [place] [address]   SARS Coronavirus 2     Status: Abnormal   Collection Time: 05/09/19 12:33 PM  Result Value Ref Range Status   SARS Coronavirus 2 DETECTED (A) NOT DETECTED Final    Comment: RESULT CALLED TO, READ BACK BY AND VERIFIED WITH: [place] Nurse RN 14:40 05/09/19 ([email or web address]) (NOTE) SARS-CoV-2 target nucleic acids are DETECTED. The SARS-CoV-2 RNA is generally detectable in upper and lower respiratory specimens during the acute phase of infection. Positive results are indicative of active infection with SARS-CoV-2. Clinical  correlation with patient history and other diagnostic information is necessary to determine patient infection status. Positive results do  not rule out bacterial infection or co-infection with other viruses. The expected result is Not Detected. Fact Sheet for Patients: http://www.biofiredefense.com/wp-content/uploads/2020/03/BIOFIRE-COVID -19-patients.pdf Fact Sheet for Healthcare Providers: http://www.biofiredefense.com/wp-content/uploads/2020/03/BIOFIRE-COVID -19-hcp.pdf This test is not yet approved or cleared by the [place] and  has been authorized for detection and/or diagnosis of SARS-CoV-2 by FDA under an Emergency  Use Authorization (EUA).  This EUA will remain in effect (meaning this test can be used) for the duration of  the COVID-19 declaration under Section 564(b)(1) of the Act, 21 U.S.C. section [phone] 3(b)(1), unless the authorization is terminated or revoked sooner. Performed at [facility], [place] [address], [place], [place] [address]      Labs: Basic Metabolic Panel: Recent Labs  Lab 05/05/19 1416 05/07/19 0700  NA 137 136  K 4.6 4.3  CL 99 96*  CO2 23 25  GLUCOSE 243*  162*  BUN 68* 57*  CREATININE 9.57* 8.26*  CALCIUM 8.5* 8.8*  PHOS 3.2 4.0   Liver Function Tests: Recent Labs  Lab 05/05/19 1416 05/07/19 0700  ALBUMIN 2.4* 2.6*   No results for input(s): LIPASE, AMYLASE in the last 168 hours. No results for input(s): AMMONIA in the last 168 hours. CBC: Recent Labs  Lab 05/05/19 1416 05/07/19 0700  WBC 10.5 8.4  HGB 10.2* 9.8*  HCT 31.8* 29.9*  MCV 97.8 96.1  PLT 243 190   Cardiac  Enzymes: No results for input(s): CKTOTAL, CKMB, CKMBINDEX, TROPONINI in the last 168 hours. BNP: BNP (last 3 results) Recent Labs    08/22/18 1256 10/18/18 0611 04/12/19 1513  BNP 903.0* 841.0*   841.0* 1,683.1*    ProBNP (last 3 results) No results for input(s): PROBNP in the last 8760 hours.  CBG: Recent Labs  Lab 05/09/19 0821 05/09/19 1232 05/09/19 1756 05/09/19 2218 05/10/19 0844  GLUCAP 192* 191* 146* 121* 148*       Signed:  Kayleen Memos, MD [facility] 05/10/2019, 9:44 AM

## 2019-05-11 LAB — GLUCOSE, CAPILLARY
Glucose-Capillary: 149 mg/dL — ABNORMAL HIGH (ref 70–99)
Glucose-Capillary: 215 mg/dL — ABNORMAL HIGH (ref 70–99)
Glucose-Capillary: 173 mg/dL — ABNORMAL HIGH (ref 70–99)
Glucose-Capillary: 150 mg/dL — ABNORMAL HIGH (ref 70–99)

## 2019-05-11 NOTE — Progress Notes (Signed)
Discharge Summary  Cory Miller HYI:[id number] DOB: 16-Jul-1941  PCP: [name], MD  Admit date: 04/12/2019 Discharge date: 05/11/2019  Time spent: 35 minutes.  DC DELAYED DUE FAMILY WANTING MORE TIME PRIOR TO TRANSFER TO [facility].  Recommendations for Outpatient Follow-up:  1. Continue hemodialysis as scheduled 2. Take medications as prescribed 3. Continue physical therapy 4. 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    Discharge Condition: Stable  Diet recommendation: Resume previous diet  Vitals:   05/10/19 2304 05/11/19 0832  BP: 117/76 (!) 173/82  Pulse: (!) 59 97  Resp:  16  Temp: 98.2 F (36.8 C) 98.4 F (36.9 C)  SpO2: 98% 96%    History of present illness:  Brief Narrative:78 y.o.malewith medical history significant ofHTN, HLD, CADs/pCABG, ESRD 23on HD(M/W/F), DM type II, CVA, GERD, and hard of hearing; who [facility] with complaints of fever, chills, cough, and shortness of breath approximately 2 weeks. Reports having fevers up to 101 F at home. Presented to [facility] ER, chest x-ray noted basilar congestion without any focal airspace opacity. COVID-19 testing was positive [place] on 5/15, transferred to [facility], due to need for hemodialysis. -With supportive care oxygen and IV steroids initially -Followed by nephrology, continued hemodialysis.  Patienthas been accepted at [facility] on a TTS  schedule with a seat time of 12:30pm for the COVID + shift. He needs to arrive at 12:15pm. His SNF [facility] in [place] has confirmed that they will provide transportation for patient to OP HD treatment.   05/11/19: Patient seen and examined at bedside.  He is alert and oriented x4.  Communication via writing.  He has no new concerns.  No acute events overnight.  Plan for discharge tomorrow 05/12/2019 to SNF.  Vital signs reviewed and are stable.  Planned DC to SNF on Monday, 05/12/19.   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 -Treated with IV steroids x5 days earlier this hospital stay which was completed on 5/28 -Remdesivir contraindicated in ESRD -Increase activity, ambulation, physical therapy, incentive spirometer -Initial positive covid-19 test was on 04/11/19, positive again on 04/24/19. Repeat COVID from 04/30/19 not detected. Repeat covid-19 test on 05/03/19 positive, repeat COVID-19 testing on 05/09/2019 positive.  ESRD on HD now TTS -Continue renal diet -Nephrology following -Patient has an HD spot per nephrology accepted at [facility] dialysis center on a Tuesday Thursday Saturday schedule with a seat time of 12:30 PM for the COVID-19 positive shift.  He needs to arrive at 12:15 PM.  [facility] [place]  [facility] has confirmed that they will provide transportation for patient to outpatient HD treatment per Nephrology.  Patient can start first outpatient treatment on Saturday,  05/10/2019.  Asymptomatic bacteriuria UA urine positive for large leukocytes with >50 WBCs. UCx: Negative.   Uncontrolled HTN Due to intermittent bradycardia continue to hold Coreg, clonidine and amlodipine Was restarted on hydralazine yesterday 50 mg twice daily, increase the dose to 100 mg twice daily. C/w close monitoring of vital signs.   Diabetes mellitus type 2 uncontrolled with hyperglycemia with renal complication -Hemoglobin A1c 7.8 on 04/25/2019 -Avoid hypoglycemia in the state of ESRD -Continue insulin coverage  Anemia of chronic disease No sign of overt bleeding Continue to monitor  History ofembolicCVA -Continue Plavix and statin  Deconditioning PT/OT consultedrecommends SNF. Continue physical therapy Fall precautions  GERD Continue Pepcid, Maalox.   Code Status:FULL   Consultants:  Nephrology  Palliative care   Antimicrobials:  Rocephin, completed course.     Discharge Exam: BP (!) 173/82 (BP Location: Left Arm)    Pulse 97    Temp 98.4 F (36.9 C) (Oral)    Resp 16    Ht 6\' 1"  (1.854 m)    Wt 110 kg    SpO2 96%    BMI 31.99 kg/m   General: 78 y.o. year-old male well-nourished in no acute distress.  Alert with a x4.  Cardiovascular: Regular rate and rhythm with no rubs or gallops.  No JVD or thyromegaly noted.   Respiratory: Clear to auscultation with no wheezes or rales.  Good respiratory effort.    Abdomen: Obese abdomen is nontender on palpation.  Normal bowel sounds x4.    Musculoskeletal: No lower extremity edema.  2 out of 4 pulses.  Discharge Instructions You were cared for by a hospitalist during your hospital stay. If you have any questions about your discharge medications or the care you received while you were in the hospital after you are discharged, you can call the unit and asked to speak with the hospitalist on call if the hospitalist that took care of you is not available. Once you are discharged, your  primary care physician will handle any further medical issues. Please note that NO REFILLS for any discharge medications will be authorized once you are discharged, as it is imperative that you return to your primary care physician (or establish a relationship with a primary care physician if you do not have one) for your aftercare needs so that they can reassess your need for medications and monitor your lab values.  Discharge Instructions    MyChart COVID-19 home monitoring program   Complete by: May 10, 2019    Is the patient willing to use the [place] for home monitoring?: Yes   Temperature monitoring   Complete by: May 10, 2019    After how many days would you like to receive a notification of this patient's flowsheet entries?: 1     Allergies as of 05/11/2019   No Known Allergies     Medication List    STOP taking these medications   amLODipine 10 MG tablet Commonly known as: NORVASC   carvedilol 6.25 MG tablet Commonly known as: COREG   cloNIDine 0.1 MG tablet Commonly known as: CATAPRES   enalapril 2.5 MG tablet Commonly known as: VASOTEC   isosorbide mononitrate 120 MG 24 hr tablet Commonly known as: IMDUR   oxyCODONE-acetaminophen 5-325 MG tablet Commonly known as: PERCOCET/ROXICET   simethicone 80 MG chewable tablet Commonly known as: MYLICON  torsemide 20 MG tablet Commonly known as: DEMADEX     TAKE these medications   allopurinol 100 MG tablet Commonly known as: ZYLOPRIM Take 100 mg by mouth daily.   ascorbic acid 500 MG tablet Commonly known as: VITAMIN C Take 1 tablet (500 mg total) by mouth daily.   aspirin EC 81 MG tablet Take 81 mg by mouth daily.   atorvastatin 40 MG tablet Commonly known as: LIPITOR Take 40 mg by mouth at bedtime.   calcium acetate 667 MG capsule Commonly known as: PHOSLO Take 1 capsule (667 mg total) by mouth 3 (three) times daily with meals.   cholecalciferol 25 MCG (1000 UT) tablet Commonly known as:  VITAMIN D Take 1,000 Units by mouth daily.   clopidogrel 75 MG tablet Commonly known as: PLAVIX Take 75 mg by mouth daily.   colchicine 0.6 MG tablet Take 0.6 mg by mouth every other day.   docusate sodium 100 MG capsule Commonly known as: COLACE Take 100 mg by mouth 2 (two) times daily.   dorzolamide-timolol 22.3-6.8 MG/ML ophthalmic solution Commonly known as: COSOPT Place 1 drop into both eyes 2 (two) times daily.   EQL Flaxseed Oil 1200 MG Caps Take 1,200 mg by mouth daily.   famotidine 20 MG tablet Commonly known as: PEPCID Take 1 tablet (20 mg total) by mouth daily.   hydrALAZINE 100 MG tablet Commonly known as: APRESOLINE Take 1 tablet (100 mg total) by mouth 2 (two) times a day. What changed: when to take this   hydrOXYzine 25 MG tablet Commonly known as: ATARAX/VISTARIL Take 25 mg by mouth 4 (four) times daily as needed for itching.   insulin aspart 100 UNIT/ML FlexPen Commonly known as: NOVOLOG Inject 10 Units into the skin 3 (three) times daily with meals.   insulin detemir 100 UNIT/ML injection Commonly known as: LEVEMIR Inject 0.1 mLs (10 Units total) into the skin daily. What changed: when to take this   ketoconazole 2 % shampoo Commonly known as: NIZORAL Apply 1 application topically 2 (two) times a week.   multivitamin Tabs tablet Take 1 tablet by mouth at bedtime.   nitroGLYCERIN 0.4 MG SL tablet Commonly known as: NITROSTAT Place 0.4 mg under the tongue every 5 (five) minutes as needed for chest pain.   pantoprazole 40 MG tablet Commonly known as: Protonix Take 1 tablet (40 mg total) by mouth 2 (two) times daily.   sodium bicarbonate 650 MG tablet Take 650 mg by mouth 2 (two) times daily.   SPS 15 GM/60ML suspension Generic drug: sodium polystyrene Take 60 mLs by mouth as directed. When eating potassium rich foods   triamcinolone cream 0.1 % Commonly known as: KENALOG Apply 1 application topically 2 (two) times daily as needed  (ITCHING).   zinc sulfate 220 (50 Zn) MG capsule Take 1 capsule (220 mg total) by mouth daily.            Durable Medical Equipment  (From admission, onward)         Start     Ordered   05/09/19 0651  For home use only DME Walker rolling  Once    Comments: 5" wheels  Question:  Patient needs a walker to treat with the following condition  Answer:  Ambulatory dysfunction   05/09/19 0650         No Known Allergies  Contact information for follow-up providers    [name], MD. Call in 1 day(s).   Specialty: Obstetrics and Gynecology Why: please call  for a post hospital follow up appointment with your PCP. Contact information: [address] [place] [address] [phone]            Contact information for after-discharge care    Destination    [facility] .   Service: Skilled Nursing Contact information: [address] [place] [phone]                   The results of significant diagnostics from this hospitalization (including imaging, microbiology, ancillary and laboratory) are listed below for reference.    Significant Diagnostic Studies: Dg Chest 1 View  Result Date: 04/11/2019 CLINICAL DATA:  Cough and fever. EXAM: CHEST  1 VIEW COMPARISON:  10/19/2018 FINDINGS: Post median sternotomy. Chronic cardiomegaly without significant change from prior. Vascular congestion with resolved pulmonary edema from prior exam. No focal airspace disease, pleural effusion, or pneumothorax. Calcified diaphragmatic plaques most evident on the right. IMPRESSION: Chronic cardiomegaly with vascular congestion. No focal airspace opacity. Electronically Signed   By: Keith Rake M.D.   On: 04/11/2019 19:58   Dg Chest Port 1 View  Result Date: 04/19/2019 CLINICAL DATA:  Shortness of breath EXAM: PORTABLE CHEST 1 VIEW COMPARISON:  Chest x-ray dated 04/17/2019 and chest x-ray dated 10/19/2018. FINDINGS: Stable  cardiomegaly. Median sternotomy wires appear stable in alignment. Persistent bibasilar opacities, likely atelectasis and/or small pleural effusions. No new lung findings. IMPRESSION: Stable chest x-ray. Persistent bibasilar opacities, likely atelectasis and/or small pleural effusions. Electronically Signed   By: Franki Cabot M.D.   On: 04/19/2019 13:09   Dg Chest Port 1 View  Result Date: 04/17/2019 CLINICAL DATA:  Shortness of breath EXAM: PORTABLE CHEST 1 VIEW COMPARISON:  04/11/2019 FINDINGS: Previous coronary bypass changes. Similar cardiomegaly with low lung volumes and basilar atelectasis. Suspect small pleural effusions layering posteriorly. No pneumothorax. Aorta atherosclerotic. Trachea is midline. IMPRESSION: Stable cardiomegaly, low lung volumes and basilar atelectasis. Suspect small pleural effusions. Electronically Signed   By: Jerilynn Mages.  Shick M.D.   On: 04/17/2019 09:15   Dg Abd Portable 1v  Result Date: 04/19/2019 CLINICAL DATA:  RIGHT lower quadrant abdominal pain. EXAM: PORTABLE ABDOMEN - 1 VIEW COMPARISON:  None. FINDINGS: Visualized bowel gas pattern is nonobstructive. No evidence of soft tissue mass or abnormal fluid collection. No evidence of free intraperitoneal air. No evidence of renal or ureteral calculi. Osseous structures are unremarkable. IMPRESSION: Negative. Electronically Signed   By: Franki Cabot M.D.   On: 04/19/2019 13:10    Microbiology: Recent Results (from the past 240 hour(s))  SARS Coronavirus 2     Status: Abnormal   Collection Time: 05/03/19  6:24 AM  Result Value Ref Range Status   SARS Coronavirus 2 DETECTED (A) NOT DETECTED Final    Comment: CRITICAL RESULT CALLED TO, READ BACK BY AND VERIFIED WITH: RN Alger Simons B. 0740 [id number] FCP (NOTE) SARS-CoV-2 target nucleic acids are DETECTED. The SARS-CoV-2 RNA is generally detectable in upper and lower respiratory specimens during the acute phase of infection. Positive results are indicative of active infection with  SARS-CoV-2. Clinical  correlation with patient history and other diagnostic information is necessary to determine patient infection status. Positive results do  not rule out bacterial infection or co-infection with other viruses. The expected result is Not Detected. Fact Sheet for Patients: http://www.biofiredefense.com/wp-content/uploads/2020/03/BIOFIRE-COVID -19-patients.pdf Fact Sheet for Healthcare Providers: http://www.biofiredefense.com/wp-content/uploads/2020/03/BIOFIRE-COVID -19-hcp.pdf This test is not yet approved or cleared by the [place] and  has been authorized for detection  and/or diagnosis of SARS-CoV-2 by FDA under an Emergency  Use Authorization (EUA).  This EUA will remain in effect (meaning this test can be used) for the duration of  the COVID-19 declaration under Section 564(b)(1) of the Act, 21 U.S.C. section [phone] 3(b)(1), unless the authorization is terminated or revoked sooner. Performed at [facility], [place] [address], [place], [place] [address]   SARS Coronavirus 2     Status: Abnormal   Collection Time: 05/09/19 12:33 PM  Result Value Ref Range Status   SARS Coronavirus 2 DETECTED (A) NOT DETECTED Final    Comment: RESULT CALLED TO, READ BACK BY AND VERIFIED WITH: [place] Nurse RN 14:40 05/09/19 (wilsonm) (NOTE) SARS-CoV-2 target nucleic acids are DETECTED. The SARS-CoV-2 RNA is generally detectable in upper and lower respiratory specimens during the acute phase of infection. Positive results are indicative of active infection with SARS-CoV-2. Clinical  correlation with patient history and other diagnostic information is necessary to determine patient infection status. Positive results do  not rule out bacterial infection or co-infection with other viruses. The expected result is Not Detected. Fact Sheet for Patients: http://www.biofiredefense.com/wp-content/uploads/2020/03/BIOFIRE-COVID -19-patients.pdf Fact Sheet for Healthcare  Providers: http://www.biofiredefense.com/wp-content/uploads/2020/03/BIOFIRE-COVID -19-hcp.pdf This test is not yet approved or cleared by the [place] and  has been authorized for detection and/or diagnosis of SARS-CoV-2 by FDA under an Emergency  Use Authorization (EUA).  This EUA will remain in effect (meaning this test can be used) for the duration of  the COVID-19 declaration under Section 564(b)(1) of the Act, 21 U.S.C. section [phone] 3(b)(1), unless the authorization is terminated or revoked sooner. Performed at [facility], [place] [address] [place], [place] [address]      Labs: Basic Metabolic Panel: Recent Labs  Lab 05/05/19 1416 05/07/19 0700  NA 137 136  K 4.6 4.3  CL 99 96*  CO2 23 25  GLUCOSE 243* 162*  BUN 68* 57*  CREATININE 9.57* 8.26*  CALCIUM 8.5* 8.8*  PHOS 3.2 4.0   Liver Function Tests: Recent Labs  Lab 05/05/19 1416 05/07/19 0700  ALBUMIN 2.4* 2.6*   No results for input(s): LIPASE, AMYLASE in the last 168 hours. No results for input(s): AMMONIA in the last 168 hours. CBC: Recent Labs  Lab 05/05/19 1416 05/07/19 0700  WBC 10.5 8.4  HGB 10.2* 9.8*  HCT 31.8* 29.9*  MCV 97.8 96.1  PLT 243 190   Cardiac Enzymes: No results for input(s): CKTOTAL, CKMB, CKMBINDEX, TROPONINI in the last 168 hours. BNP: BNP (last 3 results) Recent Labs    08/22/18 1256 10/18/18 0611 04/12/19 1513  BNP 903.0* 841.0*   841.0* 1,683.1*    ProBNP (last 3 results) No results for input(s): PROBNP in the last 8760 hours.  CBG: Recent Labs  Lab 05/10/19 1324 05/10/19 1647 05/10/19 2126 05/11/19 0830 05/11/19 1219  GLUCAP 190* 232* 248* 149* 215*       Signed:  Kayleen Memos, MD [facility] 05/11/2019, 12:56 PM

## 2019-05-11 NOTE — Progress Notes (Signed)
[facility] Progress Note    Assessment/ Plan:   Dialyzes [place] [phone]  MWF, BFR400 IHW388 AVF 4 hours EDW 116kg2K/3Ca  Assessment/Plan: 1. covid-19 pneumonia-Repeatswabneg from 6/3. 6/6 send out was positive.  2. ESRD- HD qMWFvia AVF here in hospital but transitioning to TTS. He ismuch below edw and BP's stable. Continue to challenge edw to help with hypoxia- only on low dose coreg.   Last HD on 6/13 (Sat) UF net 2.3L   Otherwise from renal standpoint he can be transferred to [facility] in [place] and start outpt HD [place] for [facility] TTS with a seat time of 1230P, needs to arrive at 1215P  3.  Diarrhea and abdominal pain- workup per primary svc  4. Anemia of ESRD- not on ESA at present- hgb over 10- no meds  5. SHPTH- stable- no binder listed on home med list- phos was starting to creep up- added phoslo - PTH 337- started on phoslo 667 1 tab TIDM Phos 4 6/10  6. Second degree heart block- per primary  7. Dispo-appreciate the hard work of [employer]; pthas been accepted at [facility] on a TTS schedule with a seat time of 12:30pm for the COVID + shift. He needs to arrive at 12:15pm. His SNF [facility] in [place] has confirmed that they will provide transportation for patient to OP HD treatment.    8. Renal Navigator notified CSW/[name], CM/D. Lovena Le and Nephrologist/Dr. Augustin Coupe.  Subjective:   He's beensuctioning himselffor secretions Diarrhea still present.   Objective:   BP 117/76   Pulse (!) 59   Temp 98.2 F (36.8 C) (Oral)   Resp 16   Ht 6\' 1"  (1.854 m)   Wt 110 kg   SpO2 98%   BMI 31.99 kg/m   Intake/Output Summary (Last 24 hours) at 05/11/2019 0719 Last data filed at 05/10/2019 2143 Gross per 24 hour  Intake 583 ml  Output 2300 ml  Net -1717 ml   Weight change:   Physical Exam: Direct physical contact not performed out of  concern for covid-19 infection and utilizing exam documented by primary team and RN documentation.  Imaging: No results found.  Labs: BMET Recent Labs  Lab 05/05/19 1416 05/07/19 0700  NA 137 136  K 4.6 4.3  CL 99 96*  CO2 23 25  GLUCOSE 243* 162*  BUN 68* 57*  CREATININE 9.57* 8.26*  CALCIUM 8.5* 8.8*  PHOS 3.2 4.0   CBC Recent Labs  Lab 05/05/19 1416 05/07/19 0700  WBC 10.5 8.4  HGB 10.2* 9.8*  HCT 31.8* 29.9*  MCV 97.8 96.1  PLT 243 190    Medications:    . atorvastatin  40 mg Oral QHS  . benzonatate  100 mg Oral TID  . calcium acetate  667 mg Oral TID WC  . Chlorhexidine Gluconate Cloth  6 each Topical Q0600  . clopidogrel  75 mg Oral Daily  . dorzolamide-timolol  1 drop Both Eyes BID  . famotidine  20 mg Oral Daily  . heparin  7,500 Units Subcutaneous Q8H  . hydrALAZINE  100 mg Oral BID  . insulin aspart  0-15 Units Subcutaneous TID WC  . insulin aspart  8 Units Subcutaneous TID WC  . insulin detemir  15 Units Subcutaneous QHS  . pantoprazole  40 mg Oral BID  . sodium chloride flush  3 mL Intravenous Q12H  . vitamin C  500 mg Oral Daily  . zinc sulfate  220 mg Oral Daily      Otelia Santee, MD 05/11/2019, 7:19 AM

## 2019-05-12 LAB — GLUCOSE, CAPILLARY
Glucose-Capillary: 164 mg/dL — ABNORMAL HIGH (ref 70–99)
Glucose-Capillary: 185 mg/dL — ABNORMAL HIGH (ref 70–99)

## 2019-05-12 MED ORDER — COLCHICINE 0.6 MG PO TABS
0.3000 mg | ORAL_TABLET | ORAL | Status: DC
  Administered 2019-05-12: 0.3 mg via ORAL
  Filled 2019-05-12: qty 0.5

## 2019-05-12 MED ORDER — ALLOPURINOL 100 MG PO TABS
100.0000 mg | ORAL_TABLET | Freq: Every day | ORAL | Status: DC
  Administered 2019-05-12: 100 mg via ORAL
  Filled 2019-05-12: qty 1

## 2019-05-12 MED ORDER — CARVEDILOL 6.25 MG PO TABS
6.2500 mg | ORAL_TABLET | Freq: Every day | ORAL | Status: DC
  Administered 2019-05-12: 6.25 mg via ORAL
  Filled 2019-05-12 (×2): qty 1

## 2019-05-12 MED ORDER — COLCHICINE 0.6 MG PO TABS: 0.3000 mg | ORAL_TABLET | ORAL | 0 refills | Status: AC

## 2019-05-12 MED ORDER — LOPERAMIDE HCL 2 MG PO CAPS: 2.0000 mg | ORAL_CAPSULE | ORAL | Status: DC | PRN

## 2019-05-12 MED ORDER — CARVEDILOL 12.5 MG PO TABS: 12.5000 mg | ORAL_TABLET | Freq: Every day | ORAL | Status: DC

## 2019-05-12 MED ORDER — LOPERAMIDE HCL 2 MG PO CAPS: 2.0000 mg | ORAL_CAPSULE | ORAL | 0 refills | Status: AC | PRN

## 2019-05-12 NOTE — Care Management Important Message (Signed)
Important Message  Patient Details  Name: Cory Miller MRN: [id number] Date of Birth: May 07, 1941   Medicare Important Message Given:  Yes    Zenon Mayo, RN 05/12/2019, 10:29 AM

## 2019-05-12 NOTE — TOC Transition Note (Signed)
Transition of Care [facility]) - CM/SW Discharge Note   Patient Details  Name: Cory Miller MRN: [id number] Date of Birth: December 09, 1940  Transition of Care [place]) CM/SW Contact:  Alberteen Sam, [place] Phone Number: [phone] 05/12/2019, 10:49 AM   Clinical Narrative:     Patient will DC to: [facility] Anticipated DC date: 05/12/2019 Family notified: Richardson Landry (son) Transport by: Corey Harold  Per MD patient ready for DC to [facility]. RN, patient, patient's family, and facility notified of DC. Discharge Summary sent to facility. RN given number for report  [phone] Room 115. DC packet on chart. Ambulance transport requested for patient for 2:00 pm pick up.  CSW signing off.  [facility] [place], [place]   Final next level of care: Skilled Nursing Facility Barriers to Discharge: No Barriers Identified   Patient Goals and CMS Choice Patient states their goals for this hospitalization and ongoing recovery are:: per pt's sister "pt to get over virus then come home" CMS Medicare.gov Compare Post Acute Care list provided to:: Patient Represenative (must comment)(Steve (son)) Choice offered to / list presented to : Adult Children  Discharge Placement PASRR number recieved: 04/18/19            Patient chooses bed at: [facility] Patient to be transferred to facility by: [facility] Name of family member notified: Richardson Landry (son) Patient and family notified of of transfer: 05/12/19  Discharge Plan and Services     Post Acute Care Choice: Home Health                               Social Determinants of Health (SDOH) Interventions     Readmission Risk Interventions Readmission Risk Prevention Plan 04/17/2019  Transportation Screening Complete  Medication Review ([place]) Complete  PCP or Specialist appointment within 3-5 days of discharge Complete  HRI or [place] Not Complete  SW Recovery Care/Counseling Consult Complete  Palliative Care  Screening Not Applicable  Skilled Nursing Facility Complete  Some recent data might be hidden

## 2019-05-12 NOTE — Progress Notes (Signed)
Attempted to call report. Transferred to receiving RN, no answer will try again.

## 2019-05-12 NOTE — Discharge Summary (Signed)
Discharge Summary  Cory Miller DJM:[id number] DOB: [birth year]  PCP: [name], MD  Admit date: 04/12/2019 Discharge date: 05/12/2019  Time spent: 35 minutes.   Recommendations for Outpatient Follow-up:  1. Continue hemodialysis as scheduled 2. Take medications as prescribed 3. Continue physical therapy 4. Fall precautions  CLIP'd for [facility] TTS with a seat time of 1230P, needs to arrive at 1215P.  His SNF [facility] in [place] has confirmed that they will provide transportation for patient to OP HD treatment.      Discharge Diagnoses:  Active Hospital Problems   Diagnosis Date Noted  . COVID-19 virus infection 04/12/2019  . Palliative care by specialist   . Goals of care, counseling/discussion   . Advanced care planning/counseling discussion   . Hypokalemia 04/12/2019  . Anemia of chronic disease 04/12/2019  . End stage renal disease ([place]) 12/26/2018  . 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 HTN (hypertension) 04/04/2016    Resolved Hospital Problems  No resolved problems to display.    Discharge Condition: Stable  Diet recommendation: Resume previous diet  Vitals:   05/12/19 0604 05/12/19 0752  BP: 132/63 119/69  Pulse: 100 (!) 45  Resp:  (!) 30  Temp:  (!) 97.2 F (36.2 C)  SpO2: 96% 98%    History of present illness:  Brief Narrative:78 y.o.malewith medical history significant ofHTN, HLD, CADs/pCABG, ESRD 23on HD(M/W/F), DM type II, CVA, GERD, and hard of hearing; who [facility] with complaints of fever, chills, cough, and shortness of breath approximately 2 weeks. Reports having fevers up to 101 F at home. Presented to [facility] ER, chest x-ray noted basilar congestion without any focal airspace opacity. COVID-19 testing was positive [place] on 5/15, transferred to [facility], due to need for hemodialysis. -With supportive care oxygen and IV steroids initially  -Followed by nephrology, continued hemodialysis.  Patienthas been accepted at [facility] on a TTS schedule with a seat time of 12:30pm for the COVID + shift. He needs to arrive at 12:15pm. His SNF [facility] in [place] has confirmed that they will provide transportation for patient to OP HD treatment.   05/11/19: Patient seen and examined at bedside.  He is alert and oriented x4.  Communication via writing.  He has no new concerns.  No acute events overnight.  Plan for discharge tomorrow 05/12/2019 to SNF.  Vital signs reviewed and are stable.  05/12/19: Patient seen and examined at his bedside.  Bradycardia after taking coreg with HR 46. Will hold off. Asymptomatic.  He has no new concerns.  He denies chest pain, dyspnea, nausea or abdominal pain. Obtain 12 lead EKG and recommend follow up with cardiology outpatient. Case manager consulted to assist with appointment.  He will need to follow-up with his primary care provider posthospitalization.    He will also need to follow-up with his cardiologist posthospitalization for monitoring of chronic .    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 -Treated with IV steroids x5 days  earlier this hospital stay which was completed on 5/28 -Remdesivir contraindicated in ESRD -Increase activity, ambulation, physical therapy, incentive spirometer -Initial positive covid-19 test was on 04/11/19, positive again on 04/24/19.  Repeat COVID from 04/30/19 not detected. Repeat covid-19 test on 05/03/19 positive, repeat COVID-19 testing on 05/09/2019 positive.  ESRD on HD now TTS -Continue renal diet -Nephrology following -Patient has an HD spot per nephrology accepted at [facility] dialysis center on a Tuesday Thursday Saturday schedule with a seat time of 12:30 PM for the COVID-19 positive shift.  He needs to arrive at 12:15 PM.  His SNF [facility] has confirmed that they will provide transportation for patient to outpatient HD treatment per Nephrology.   Asymptomatic bacteriuria UA urine positive for large leukocytes with >50 WBCs. UCx: Negative.  Completed antibiotics  Covid-19 related diarrhea Improved Afebrile, non toxic appearing No nausea or abdominal pain Imodium as needed  Uncontrolled HTN Due to intermittent bradycardia continue to hold Coreg, clonidine and amlodipine C/w hydralazine 100 mg twice daily. Follow up with your PCP and cardiology  Asymptomatic Bradycardia Continue to hold off BB and CCB for HR less than 60 Follow up with cardiology outpatient  Diabetes mellitus type 2 uncontrolled with hyperglycemia with renal complication -Hemoglobin A1c 7.8 on 04/25/2019 -Avoid hypoglycemia in the state of ESRD -Continue insulin coverage  Anemia of chronic disease No sign of overt bleeding Continue to monitor  History ofembolicCVA -Continue Plavix and statin  Deconditioning PT/OT consultedrecommends SNF. Continue physical therapy Fall precautions  GERD Continue Pepcid, Maalox.   Code Status:FULL   Consultants:  Nephrology  Palliative care   Antimicrobials:  Rocephin, completed course.     Discharge Exam: BP 119/69 (BP Location: Left Leg)   Pulse (!) 45   Temp (!) 97.2 F (36.2 C) (Oral)   Resp (!) 30   Ht 6\' 1"  (1.854 m)   Wt 108.8 kg   SpO2 98%   BMI 31.65 kg/m  . General: 78 y.o. year-old male well-developed well-nourished in no  acute distress.  Alert and oriented x4.  Hearing impairment. . Cardiovascular: Regular rate and rhythm with no rubs or gallops.  No JVD or thyromegaly noted. [name] Respiratory: Clear to auscultation with no wheezes or rales. Good respiratory effort.   . Abdomen: Obese nontender on palpation.  Normal bowel sounds x4.  . Musculoskeletal: No lower extremity edema.  2 out of 4 pulses all 4 extremities.  Discharge Instructions You were cared for by a hospitalist during your hospital stay. If you have any questions about your discharge medications or the care you received while you were in the hospital after you are discharged, you can call the unit and asked to speak with the hospitalist on call if the hospitalist that took care of you is not available. Once you are discharged, your primary care physician will handle any further medical issues. Please note that NO REFILLS for any discharge medications will be authorized once you are discharged, as it is imperative that you return to your primary care physician (or establish a relationship with a primary care physician if you do not have one) for your aftercare needs so that they can reassess your need for medications and monitor your lab values.  Discharge Instructions    MyChart COVID-19 home monitoring program   Complete by: May 10, 2019    Is the patient willing to use the [place] for home monitoring?: Yes   Temperature monitoring   Complete by: May 10, 2019  After how many days would you like to receive a notification of this patient's flowsheet entries?: 1     Allergies as of 05/12/2019   No Known Allergies     Medication List    STOP taking these medications   amLODipine 10 MG tablet Commonly known as: NORVASC   carvedilol 6.25 MG tablet Commonly known as: COREG   cloNIDine 0.1 MG tablet Commonly known as: CATAPRES   docusate sodium 100 MG capsule Commonly known as: COLACE   enalapril 2.5 MG tablet Commonly known as:  VASOTEC   isosorbide mononitrate 120 MG 24 hr tablet Commonly known as: IMDUR   oxyCODONE-acetaminophen 5-325 MG tablet Commonly known as: PERCOCET/ROXICET   simethicone 80 MG chewable tablet Commonly known as: MYLICON   torsemide 20 MG tablet Commonly known as: DEMADEX     TAKE these medications   allopurinol 100 MG tablet Commonly known as: ZYLOPRIM Take 100 mg by mouth daily.   ascorbic acid 500 MG tablet Commonly known as: VITAMIN C Take 1 tablet (500 mg total) by mouth daily.   aspirin EC 81 MG tablet Take 81 mg by mouth daily.   atorvastatin 40 MG tablet Commonly known as: LIPITOR Take 40 mg by mouth at bedtime.   calcium acetate 667 MG capsule Commonly known as: PHOSLO Take 1 capsule (667 mg total) by mouth 3 (three) times daily with meals.   cholecalciferol 25 MCG (1000 UT) tablet Commonly known as: VITAMIN D Take 1,000 Units by mouth daily.   clopidogrel 75 MG tablet Commonly known as: PLAVIX Take 75 mg by mouth daily.   colchicine 0.6 MG tablet Take 0.5 tablets (0.3 mg total) by mouth every Monday, Wednesday, and Friday with hemodialysis. What changed:   how much to take  when to take this   dorzolamide-timolol 22.3-6.8 MG/ML ophthalmic solution Commonly known as: COSOPT Place 1 drop into both eyes 2 (two) times daily.   EQL Flaxseed Oil 1200 MG Caps Take 1,200 mg by mouth daily.   famotidine 20 MG tablet Commonly known as: PEPCID Take 1 tablet (20 mg total) by mouth daily.   hydrALAZINE 100 MG tablet Commonly known as: APRESOLINE Take 1 tablet (100 mg total) by mouth 2 (two) times a day. What changed: when to take this   hydrOXYzine 25 MG tablet Commonly known as: ATARAX/VISTARIL Take 25 mg by mouth 4 (four) times daily as needed for itching.   insulin aspart 100 UNIT/ML FlexPen Commonly known as: NOVOLOG Inject 10 Units into the skin 3 (three) times daily with meals.   insulin detemir 100 UNIT/ML injection Commonly known as:  LEVEMIR Inject 0.1 mLs (10 Units total) into the skin daily. What changed: when to take this   ketoconazole 2 % shampoo Commonly known as: NIZORAL Apply 1 application topically 2 (two) times a week.   loperamide 2 MG capsule Commonly known as: IMODIUM Take 1 capsule (2 mg total) by mouth as needed for diarrhea or loose stools.   multivitamin Tabs tablet Take 1 tablet by mouth at bedtime.   nitroGLYCERIN 0.4 MG SL tablet Commonly known as: NITROSTAT Place 0.4 mg under the tongue every 5 (five) minutes as needed for chest pain.   pantoprazole 40 MG tablet Commonly known as: Protonix Take 1 tablet (40 mg total) by mouth 2 (two) times daily.   sodium bicarbonate 650 MG tablet Take 650 mg by mouth 2 (two) times daily.   SPS 15 GM/60ML suspension Generic drug: sodium polystyrene Take 60 mLs by mouth as  directed. When eating potassium rich foods   triamcinolone cream 0.1 % Commonly known as: KENALOG Apply 1 application topically 2 (two) times daily as needed (ITCHING).   zinc sulfate 220 (50 Zn) MG capsule Take 1 capsule (220 mg total) by mouth daily.            Durable Medical Equipment  (From admission, onward)         Start     Ordered   05/09/19 0651  For home use only DME Walker rolling  Once    Comments: 5" wheels  Question:  Patient needs a walker to treat with the following condition  Answer:  Ambulatory dysfunction   05/09/19 0650         No Known Allergies  Contact information for follow-up providers    [name], MD. Call in 1 day(s).   Specialty: Obstetrics and Gynecology Why: please call for a post hospital follow up appointment with your PCP. Contact information: [address] [place] [address] [phone]            Contact information for after-discharge care    Destination    [facility] .   Service: Skilled Nursing Contact information: [address] [place] [phone]                    The results of significant diagnostics from this hospitalization (including imaging, microbiology, ancillary and laboratory) are listed below for reference.    Significant Diagnostic Studies: Dg Chest Port 1 View  Result Date: 04/19/2019 CLINICAL DATA:  Shortness of breath EXAM: PORTABLE CHEST 1 VIEW COMPARISON:  Chest x-ray dated 04/17/2019 and chest x-ray dated 10/19/2018. FINDINGS: Stable cardiomegaly. Median sternotomy wires appear stable in alignment. Persistent bibasilar opacities, likely atelectasis and/or small pleural effusions. No new lung findings. IMPRESSION: Stable chest x-ray. Persistent bibasilar opacities, likely atelectasis and/or small pleural effusions. Electronically Signed   By: Franki Cabot M.D.   On: 04/19/2019 13:09   Dg Chest Port 1 View  Result Date: 04/17/2019 CLINICAL DATA:  Shortness of breath EXAM: PORTABLE CHEST 1 VIEW COMPARISON:  04/11/2019 FINDINGS: Previous coronary bypass changes. Similar cardiomegaly with low lung volumes and basilar atelectasis. Suspect small pleural effusions layering posteriorly. No pneumothorax. Aorta atherosclerotic. Trachea is midline. IMPRESSION: Stable cardiomegaly, low lung volumes and basilar atelectasis. Suspect small pleural effusions. Electronically Signed   By: Jerilynn Mages.  Shick M.D.   On: 04/17/2019 09:15   Dg Abd Portable 1v  Result Date: 04/19/2019 CLINICAL DATA:  RIGHT lower quadrant abdominal pain. EXAM: PORTABLE ABDOMEN - 1 VIEW COMPARISON:  None. FINDINGS: Visualized bowel gas pattern is nonobstructive. No evidence of soft tissue mass or abnormal fluid collection. No evidence of free intraperitoneal air. No evidence of renal or ureteral calculi. Osseous structures are unremarkable. IMPRESSION: Negative. Electronically Signed   By: Franki Cabot M.D.   On: 04/19/2019 13:10    Microbiology: Recent Results (from the past 240 hour(s))  SARS Coronavirus 2     Status: Abnormal   Collection Time: 05/03/19  6:24  AM  Result Value Ref Range Status   SARS Coronavirus 2 DETECTED (A) NOT DETECTED Final    Comment: CRITICAL RESULT CALLED TO, READ BACK BY AND VERIFIED WITH: RN Alger Simons B. [id number] [id number] FCP (NOTE) SARS-CoV-2 target nucleic acids are DETECTED. The SARS-CoV-2 RNA is generally detectable in upper and lower respiratory specimens during the acute phase of infection. Positive results are indicative of active infection with  SARS-CoV-2. Clinical  correlation with patient history and other diagnostic information is necessary to determine patient infection status. Positive results do  not rule out bacterial infection or co-infection with other viruses. The expected result is Not Detected. Fact Sheet for Patients: http://www.biofiredefense.com/wp-content/uploads/2020/03/BIOFIRE-COVID -19-patients.pdf Fact Sheet for Healthcare Providers: http://www.biofiredefense.com/wp-content/uploads/2020/03/BIOFIRE-COVID -19-hcp.pdf This test is not yet approved or cleared by the [place] and  has been authorized for detection and/or diagnosis of SARS-CoV-2 by FDA under an Emergency  Use Authorization (EUA).  This EUA will remain in effect (meaning this test can be used) for the duration of  the COVID-19 declaration under Section 564(b)(1) of the Act, 21 U.S.C. section [phone] 3(b)(1), unless the authorization is terminated or revoked sooner. Performed at [facility], [place] [address], [place], [place] [address]   SARS Coronavirus 2     Status: Abnormal   Collection Time: 05/09/19 12:33 PM  Result Value Ref Range Status   SARS Coronavirus 2 DETECTED (A) NOT DETECTED Final    Comment: RESULT CALLED TO, READ BACK BY AND VERIFIED WITH: [place] Nurse RN 14:40 05/09/19 ([email or web address]) (NOTE) SARS-CoV-2 target nucleic acids are DETECTED. The SARS-CoV-2 RNA is generally detectable in upper and lower respiratory specimens during the acute phase of infection. Positive results are indicative of active  infection with SARS-CoV-2. Clinical  correlation with patient history and other diagnostic information is necessary to determine patient infection status. Positive results do  not rule out bacterial infection or co-infection with other viruses. The expected result is Not Detected. Fact Sheet for Patients: http://www.biofiredefense.com/wp-content/uploads/2020/03/BIOFIRE-COVID -19-patients.pdf Fact Sheet for Healthcare Providers: http://www.biofiredefense.com/wp-content/uploads/2020/03/BIOFIRE-COVID -19-hcp.pdf This test is not yet approved or cleared by the [place] and  has been authorized for detection and/or diagnosis of SARS-CoV-2 by FDA under an Emergency  Use Authorization (EUA).  This EUA will remain in effect (meaning this test can be used) for the duration of  the COVID-19 declaration under Section 564(b)(1) of the Act, 21 U.S.C. section [phone] 3(b)(1), unless the authorization is terminated or revoked sooner. Performed at [facility], [place] [address], [place], [place] [address]      Labs: Basic Metabolic Panel: Recent Labs  Lab 05/05/19 1416 05/07/19 0700  NA 137 136  K 4.6 4.3  CL 99 96*  CO2 23 25  GLUCOSE 243* 162*  BUN 68* 57*  CREATININE 9.57* 8.26*  CALCIUM 8.5* 8.8*  PHOS 3.2 4.0   Liver Function Tests: Recent Labs  Lab 05/05/19 1416 05/07/19 0700  ALBUMIN 2.4* 2.6*   No results for input(s): LIPASE, AMYLASE in the last 168 hours. No results for input(s): AMMONIA in the last 168 hours. CBC: Recent Labs  Lab 05/05/19 1416 05/07/19 0700  WBC 10.5 8.4  HGB 10.2* 9.8*  HCT 31.8* 29.9*  MCV 97.8 96.1  PLT 243 190   Cardiac Enzymes: No results for input(s): CKTOTAL, CKMB, CKMBINDEX, TROPONINI in the last 168 hours. BNP: BNP (last 3 results) Recent Labs    08/22/18 1256 10/18/18 0611 04/12/19 1513  BNP 903.0* 841.0*  841.0* 1,683.1*    ProBNP (last 3 results) No results for input(s): PROBNP in the last 8760 hours.   CBG: Recent Labs  Lab 05/11/19 0830 05/11/19 1219 05/11/19 1629 05/11/19 2202 05/12/19 0801  GLUCAP 149* 215* 173* 150* 164*       Signed:  Kayleen Memos, MD [facility] 05/12/2019, 10:22 AM

## 2019-05-12 NOTE — Progress Notes (Signed)
Attempted to call report. Transferred to receiving RN, no response.

## 2019-05-12 NOTE — Plan of Care (Signed)
  Problem: Education: Goal: Knowledge of General Education information will improve Description: Including pain rating scale, medication(s)/side effects and non-pharmacologic comfort measures Outcome: Completed/Met   Problem: Health Behavior/Discharge Planning: Goal: Ability to manage health-related needs will improve Outcome: Completed/Met   Problem: Clinical Measurements: Goal: Ability to maintain clinical measurements within normal limits will improve Outcome: Completed/Met Goal: Will remain free from infection Outcome: Completed/Met Goal: Diagnostic test results will improve Outcome: Completed/Met Goal: Respiratory complications will improve Outcome: Completed/Met Goal: Cardiovascular complication will be avoided Outcome: Completed/Met   Problem: Activity: Goal: Risk for activity intolerance will decrease Outcome: Completed/Met   Problem: Nutrition: Goal: Adequate nutrition will be maintained Outcome: Completed/Met   Problem: Coping: Goal: Level of anxiety will decrease Outcome: Completed/Met   Problem: Elimination: Goal: Will not experience complications related to bowel motility Outcome: Completed/Met Goal: Will not experience complications related to urinary retention Outcome: Completed/Met   Problem: Pain Managment: Goal: General experience of comfort will improve Outcome: Completed/Met   Problem: Safety: Goal: Ability to remain free from injury will improve Outcome: Completed/Met   Problem: Skin Integrity: Goal: Risk for impaired skin integrity will decrease Outcome: Completed/Met   Problem: Fluid Volume: Goal: Compliance with measures to maintain balanced fluid volume will improve Outcome: Completed/Met   Problem: Health Behavior/Discharge Planning: Goal: Ability to manage health-related needs will improve Outcome: Completed/Met   Problem: Clinical Measurements: Goal: Complications related to the disease process, condition or treatment will be  avoided or minimized Outcome: Completed/Met   Problem: Education: Goal: Knowledge of risk factors and measures for prevention of condition will improve Outcome: Completed/Met   Problem: Coping: Goal: Psychosocial and spiritual needs will be supported Outcome: Completed/Met   Problem: Respiratory: Goal: Will maintain a patent airway Outcome: Completed/Met Goal: Complications related to the disease process, condition or treatment will be avoided or minimized Outcome: Completed/Met

## 2019-05-12 NOTE — Progress Notes (Signed)
CSW confirmed with [facility] and family that patient is ready to be discharged today. CSW confirmed with renal navigator Terri Piedra patient is set up for dialysis tomorrow. CSW has informed Dr. Nevada Crane of this information, patient can discharge pending updated dc summary and dc orders.   [place], [place]

## 2019-05-12 NOTE — Progress Notes (Signed)
Renal Navigator received call from CSW/[name] confirming that patient is set to start OP HD at [place] + shift at [facility]. Renal Navigator confirmed. Renal Navigator spoke with Renal PA this morning to provide fax number to send orders. Renal Navigator spoke with Vanice Sarah to confirm that patient is being discharged today per CSW/[name] and will start at [place] + shift tomorrow, Tuesday 05/13/19. Renal Navigator faxed discharge summary to OP HD clinic per Quandra's request.  Alphonzo Cruise, [place] Renal Navigator [phone]

## 2019-05-12 NOTE — Progress Notes (Signed)
CSW informed by Dr. Nevada Crane that patient will need to follow up with cardiologist.   CSW noted patient was last seen by cardiologist Dr. Nehemiah Massed at [facility] in [place] in 2018. Their last notes from 12/02/2018 state patient would need to go through his PCP to get referral for cardiologist follow up apt.   CSW notified patient's son Keiandre Cygan that he will need to call patient's PCP to be referred to cardiologist and schedule appointment. Patient's son expressed understanding and reports he will do this asap.   [place], [place]

## 2019-09-16 ENCOUNTER — Ambulatory Visit: Payer: Medicare HMO | Attending: Internal Medicine

## 2019-09-16 DIAGNOSIS — G4733 Obstructive sleep apnea (adult) (pediatric): Secondary | ICD-10-CM | POA: Diagnosis not present

## 2019-09-17 ENCOUNTER — Other Ambulatory Visit: Payer: Self-pay

## 2019-11-10 ENCOUNTER — Other Ambulatory Visit: Payer: Self-pay

## 2019-11-10 ENCOUNTER — Other Ambulatory Visit
Admission: RE | Admit: 2019-11-10 | Discharge: 2019-11-10 | Disposition: A | Discharge status: Home / Self Care | Payer: Medicare HMO | Source: Ambulatory Visit | Attending: Physician Assistant | Admitting: Physician Assistant

## 2019-11-10 DIAGNOSIS — Z20828 Contact with and (suspected) exposure to other viral communicable diseases: Secondary | ICD-10-CM | POA: Diagnosis not present

## 2019-11-10 DIAGNOSIS — Z01812 Encounter for preprocedural laboratory examination: Secondary | ICD-10-CM | POA: Diagnosis present

## 2019-11-11 LAB — SARS CORONAVIRUS 2 (TAT 6-24 HRS): SARS Coronavirus 2: NEGATIVE

## 2019-11-21 IMAGING — DX DG CHEST 1V PORT
1 series · 2 of 2 positions shown · non-contrast
Comparison: Radiographs January 02, 2017.

CLINICAL DATA: Shortness of breath.

EXAM:
PORTABLE CHEST 1 VIEW

[Series 1: chest ap · 0.14mm/px · 2 of 2 slices shown]
[im 1/2]
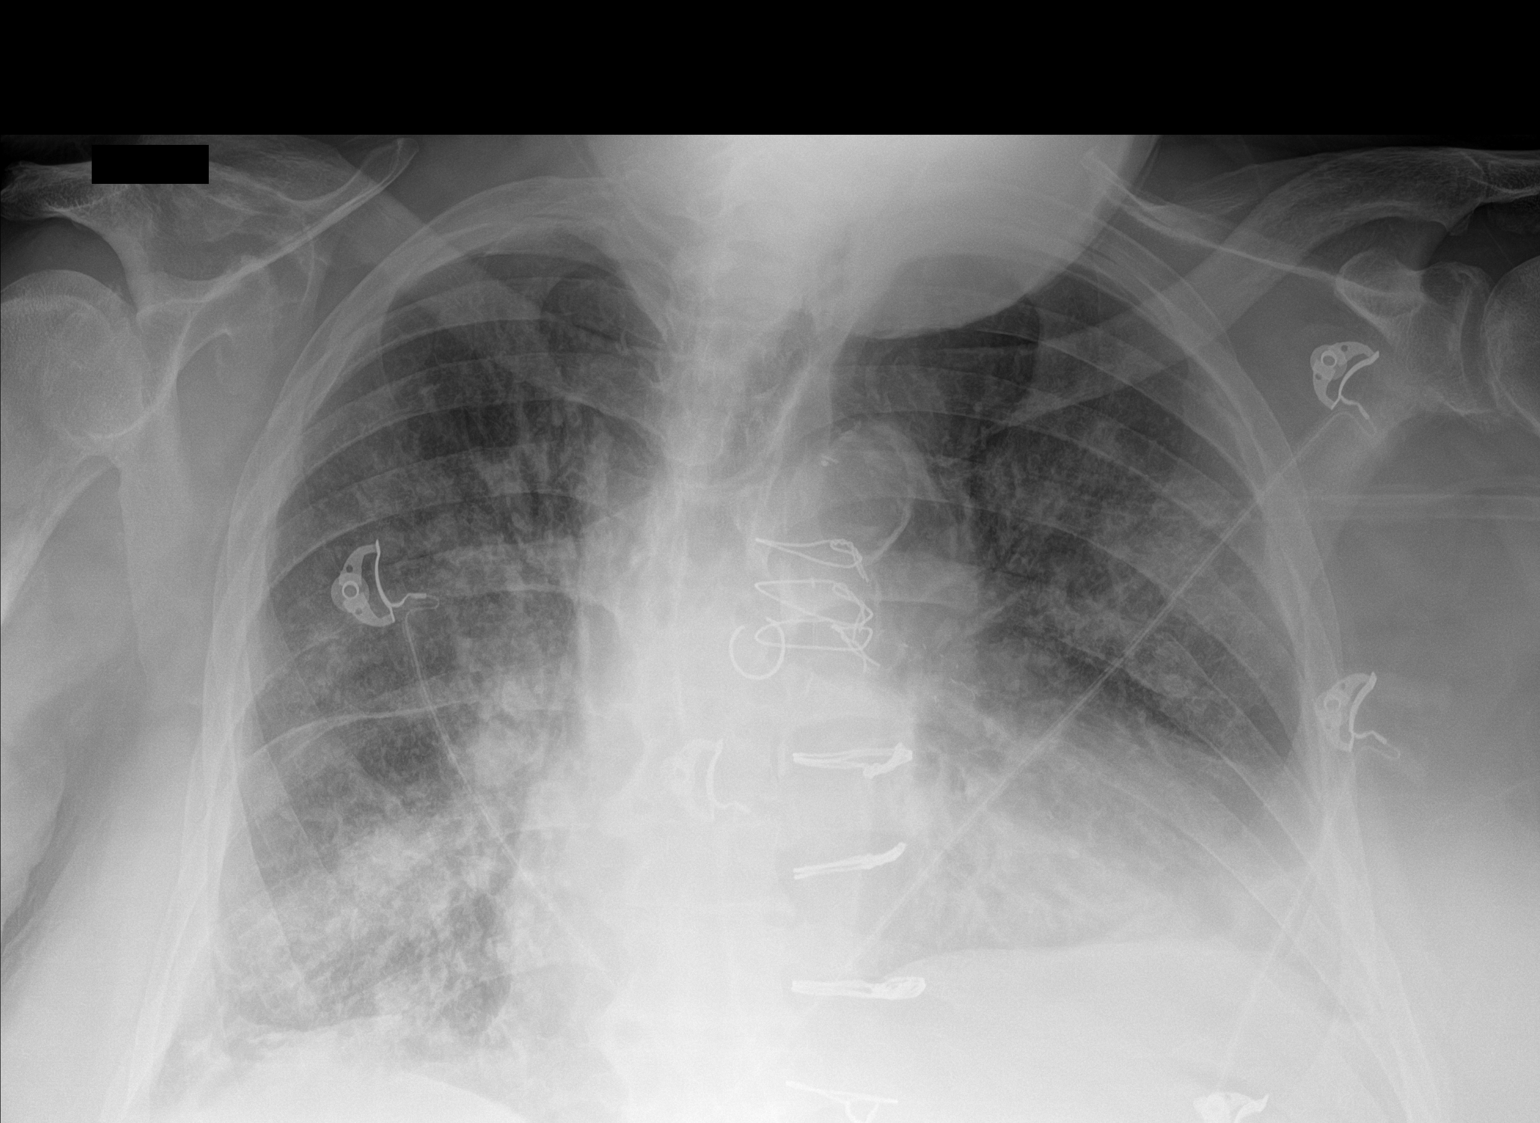
[im 2/2]
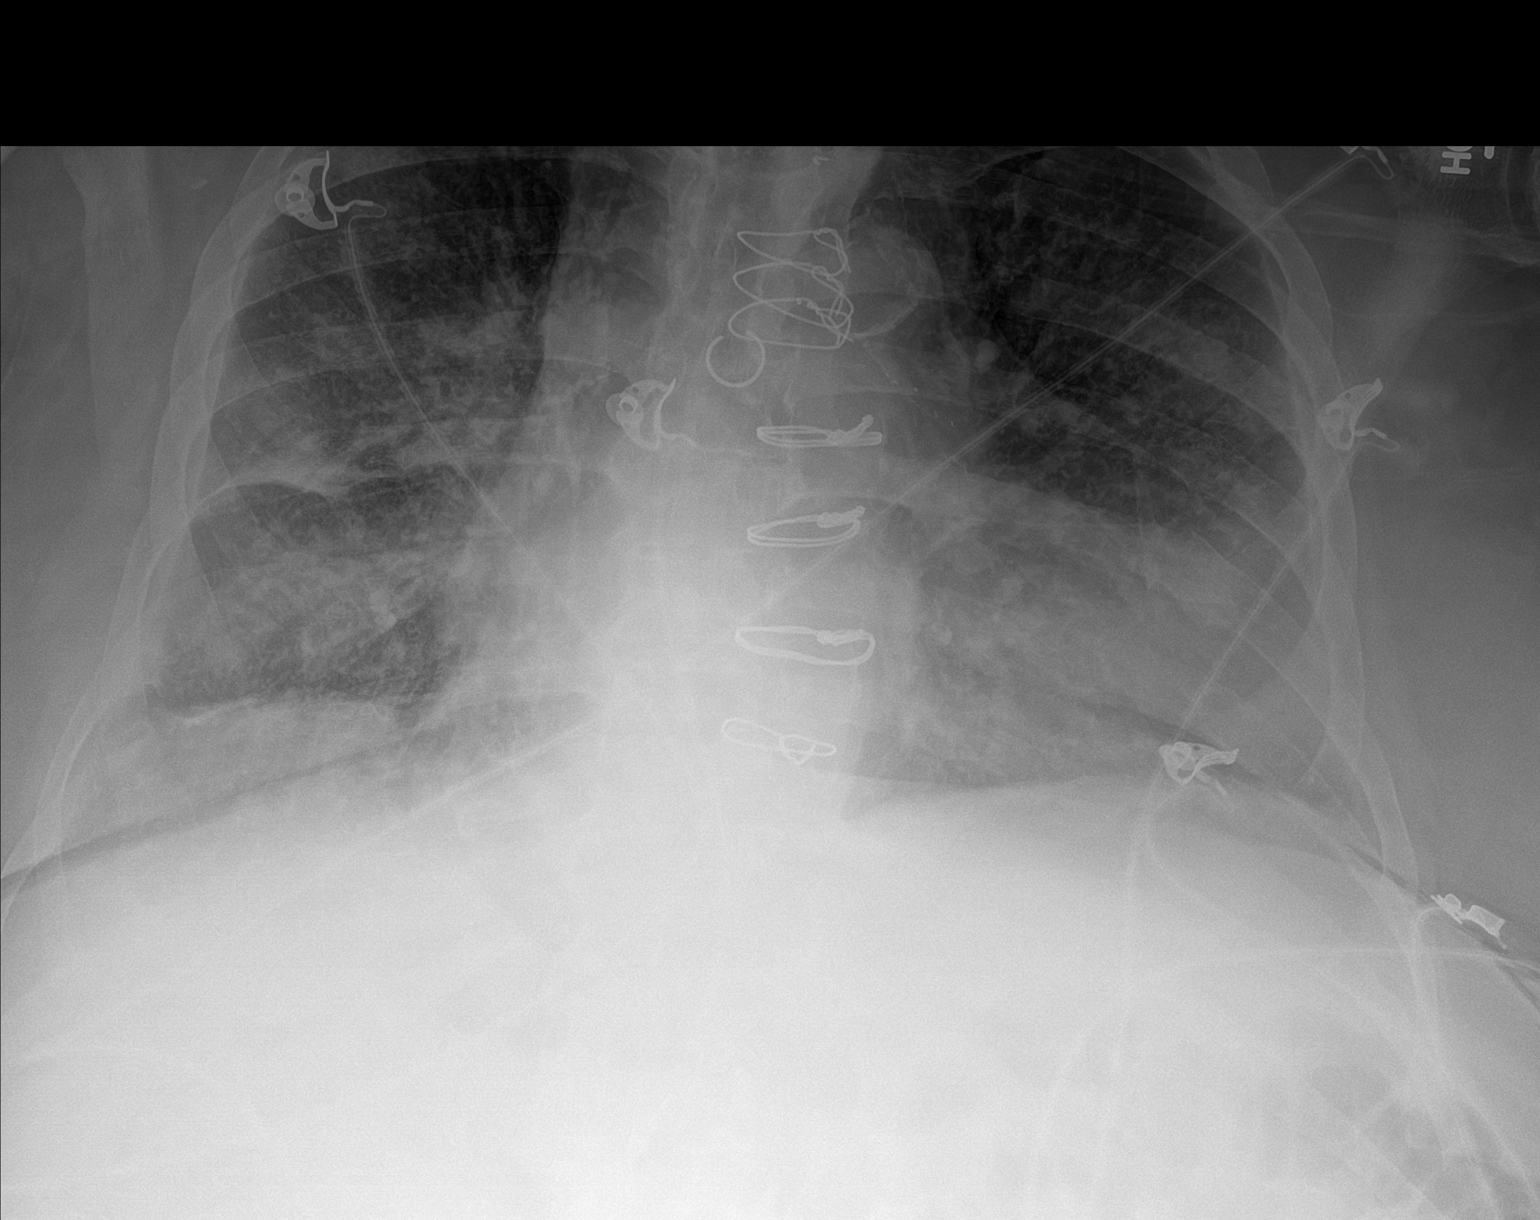

[2 of 2 positions shown; findings below may reference images not displayed]

FINDINGS: Stable cardiomegaly. Status post coronary artery bypass graft.
Atherosclerosis of thoracic aorta is noted. No pneumothorax is
noted. Increased bilateral lung opacities are noted concerning for
pneumonia or edema. Hypoinflation of the lungs are noted. Mild left
pleural effusion is noted. Bony thorax is unremarkable.
IMPRESSION: Increased bilateral lung opacities are noted concerning for
pneumonia or edema. Mild left pleural effusion is noted.

Aortic Atherosclerosis (VL2C2-PTL.L).

## 2019-11-22 IMAGING — DX DG CHEST 1V PORT
1 series · 1 of 1 positions shown · non-contrast
Comparison: 08/22/2018

CLINICAL DATA: Acute respiratory failure

EXAM:
PORTABLE CHEST 1 VIEW

[chest ap]
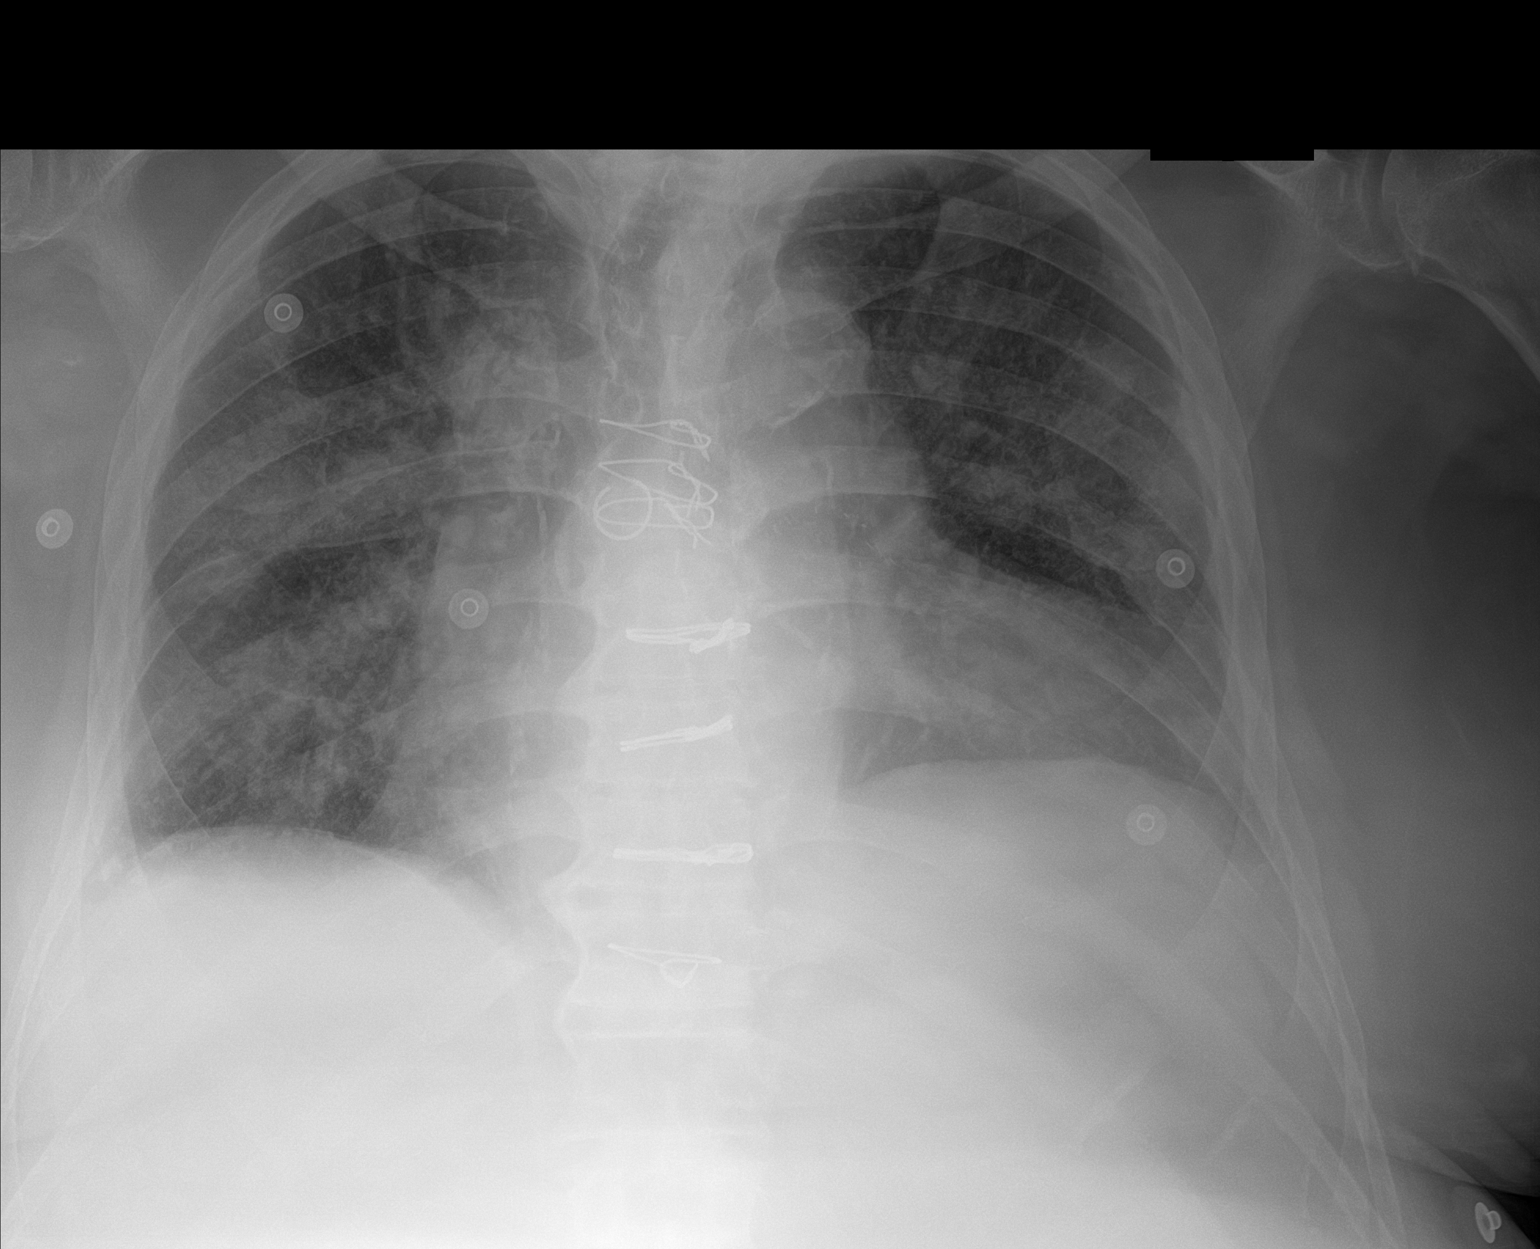

[1 of 1 positions shown; findings below may reference images not displayed]

FINDINGS: Bilateral interstitial and alveolar airspace opacities. No pleural
effusion or pneumothorax. Stable cardiomegaly. Prior CABG. No acute
osseous abnormality.
IMPRESSION: 1. Cardiomegaly and bilateral interstitial and alveolar airspace
opacities. Differential considerations include pulmonary edema
versus multilobar pneumonia.

## 2019-11-23 ENCOUNTER — Ambulatory Visit: Payer: Medicare HMO | Attending: Internal Medicine

## 2019-11-23 DIAGNOSIS — G4761 Periodic limb movement disorder: Secondary | ICD-10-CM | POA: Diagnosis not present

## 2019-11-23 DIAGNOSIS — G4733 Obstructive sleep apnea (adult) (pediatric): Secondary | ICD-10-CM | POA: Insufficient documentation

## 2019-11-24 ENCOUNTER — Ambulatory Visit: Payer: Medicare HMO

## 2019-11-24 ENCOUNTER — Other Ambulatory Visit: Payer: Self-pay

## 2019-11-25 IMAGING — CR DG CHEST 2V
1 series · 2 of 2 positions shown · non-contrast
Comparison: 08/24/2018 and 08/23/2018 radiographs.

CLINICAL DATA: Pulmonary edema. History of diabetes, hypertension,
myocardial infarction and CVA.

EXAM:
CHEST - 2 VIEW

[Series 1: dg chest 2 view · 0.14mm/px · 2 of 2 slices shown]
[im 1/2]
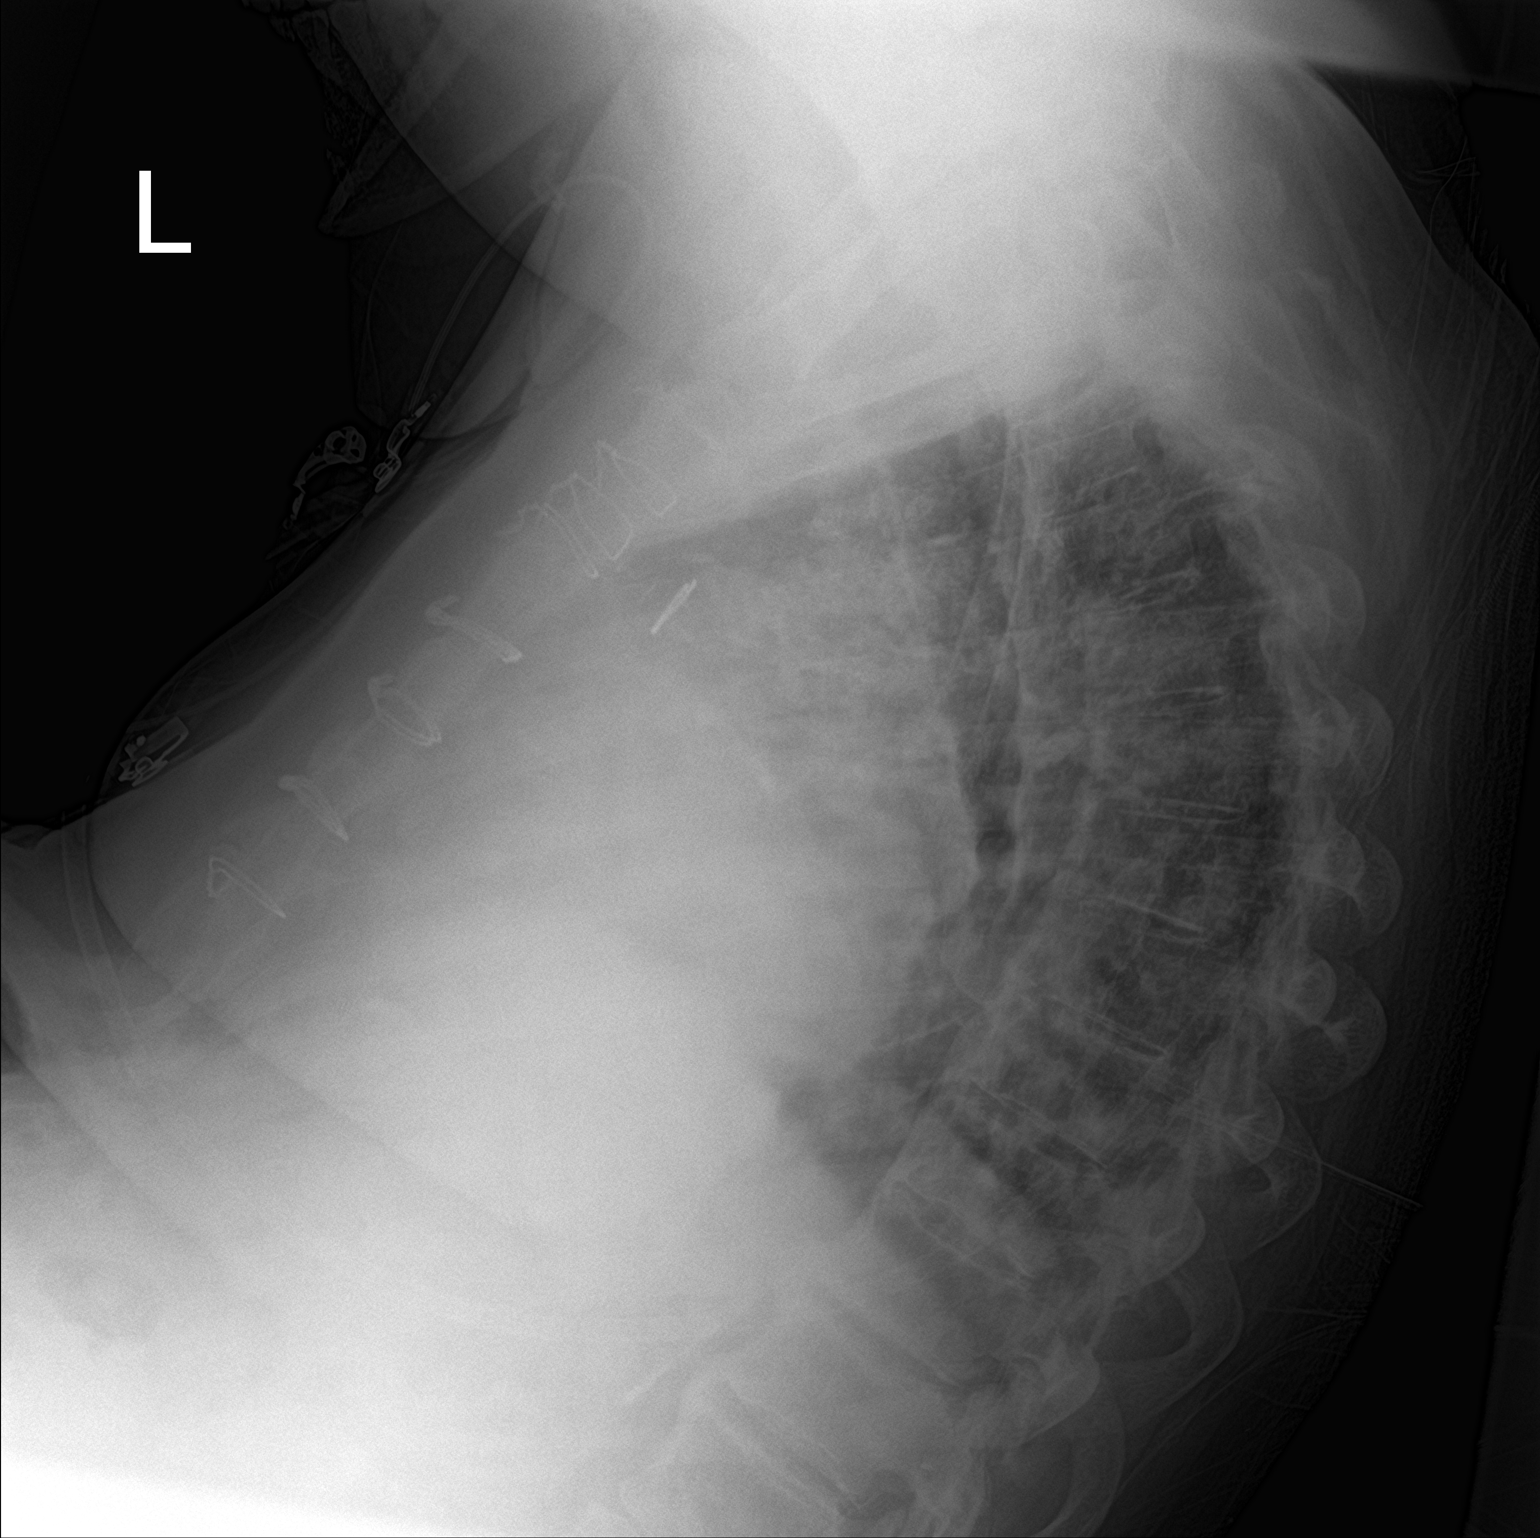
[im 2/2]
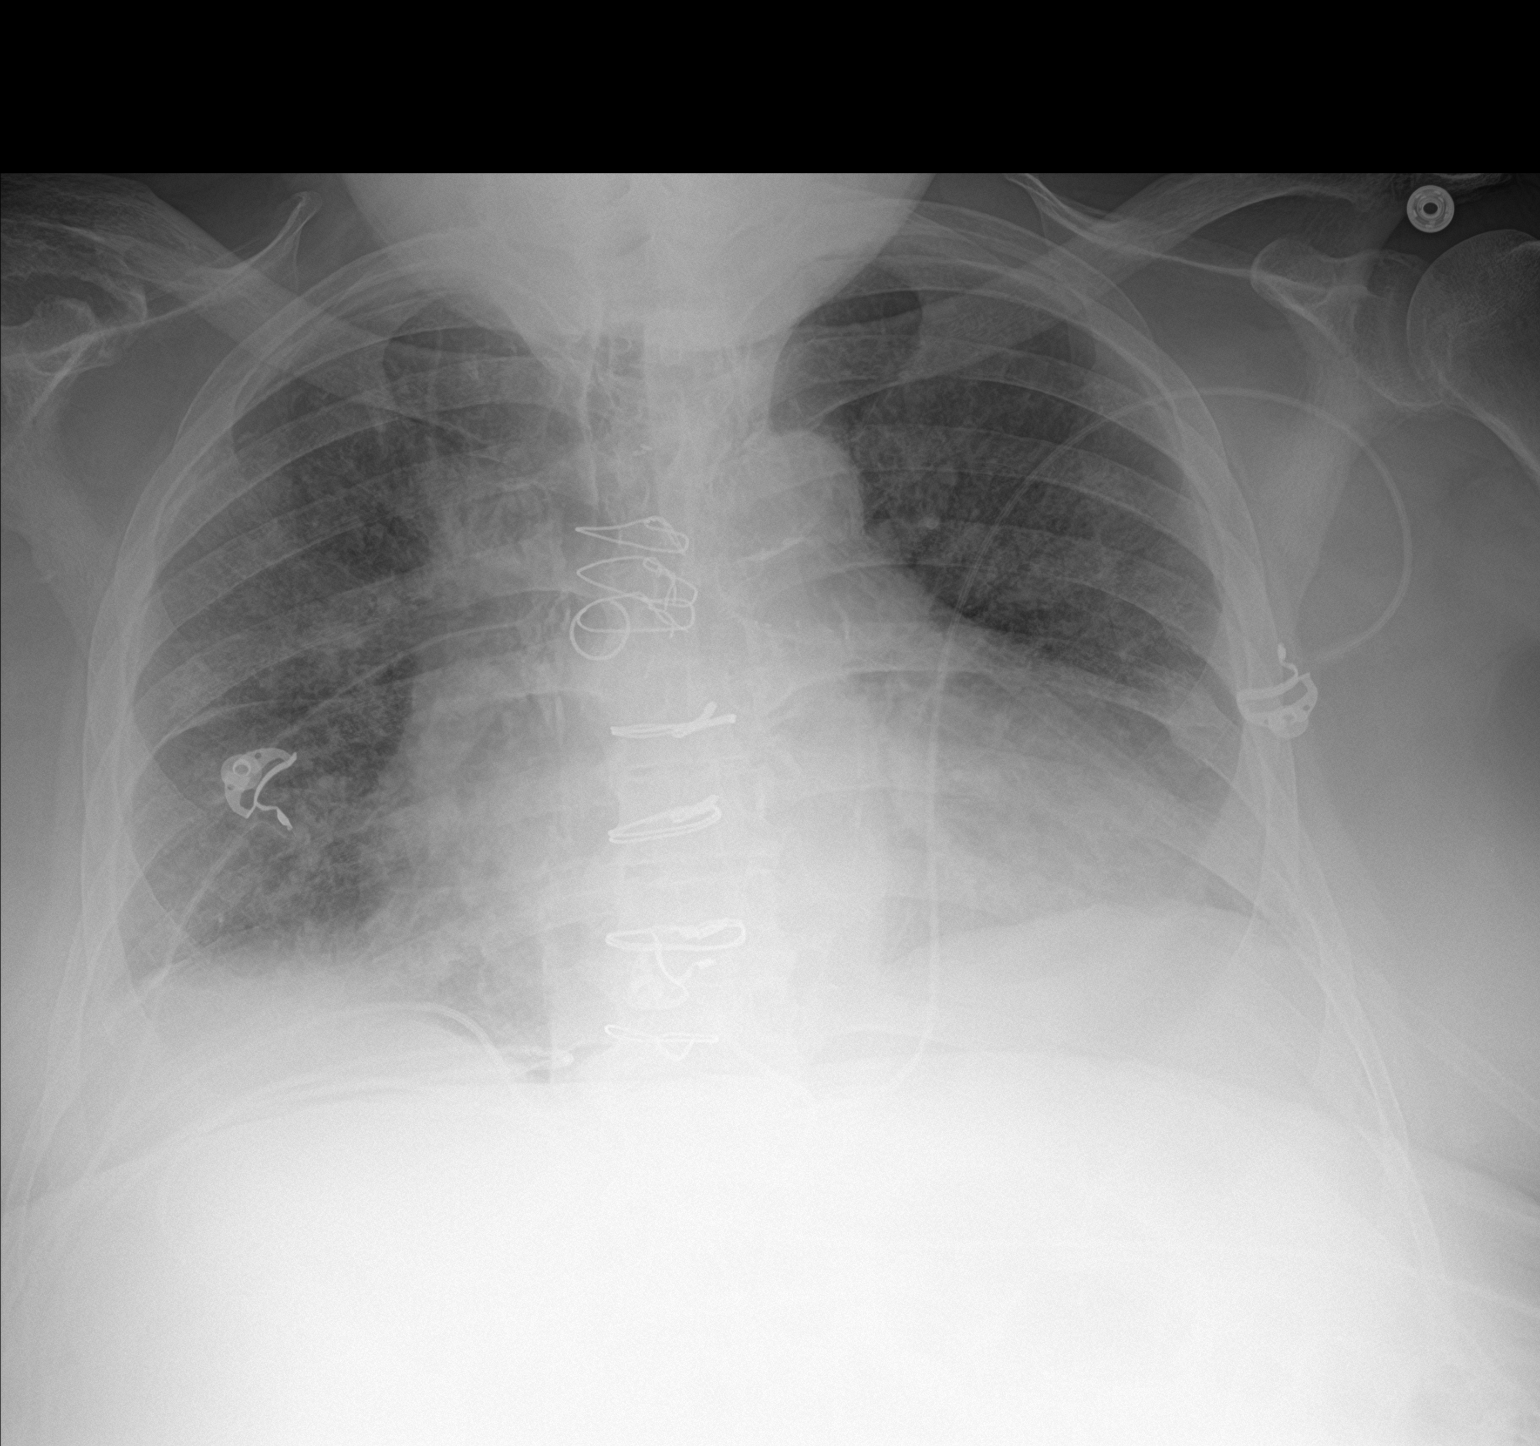

[2 of 2 positions shown; findings below may reference images not displayed]

FINDINGS: Stable cardiomegaly and aortic atherosclerosis post CABG. The
diffuse bilateral perihilar pulmonary opacities appear slightly
improved, likely resolving edema. There is no confluent airspace
opacity, pneumothorax or significant pleural effusion. Old rib
fractures are noted on the left.
IMPRESSION: Slight improvement in bilateral pulmonary opacities over the last 2
days, likely resolving pulmonary edema. Stable cardiomegaly.

## 2019-11-28 ENCOUNTER — Ambulatory Visit
Admission: RE | Admit: 2019-11-28 | Discharge: 2019-11-28 | Disposition: A | Discharge status: Home / Self Care | Payer: Medicare HMO | Attending: Obstetrics and Gynecology | Admitting: Obstetrics and Gynecology

## 2019-11-28 ENCOUNTER — Other Ambulatory Visit: Payer: Self-pay

## 2019-11-28 ENCOUNTER — Other Ambulatory Visit: Payer: Self-pay | Admitting: Obstetrics and Gynecology

## 2019-11-28 ENCOUNTER — Ambulatory Visit
Admission: RE | Admit: 2019-11-28 | Discharge: 2019-11-28 | Disposition: A | Discharge status: Home / Self Care | Payer: Medicare HMO | Source: Ambulatory Visit | Attending: Obstetrics and Gynecology | Admitting: Obstetrics and Gynecology

## 2019-11-28 DIAGNOSIS — M79672 Pain in left foot: Secondary | ICD-10-CM

## 2020-04-10 ENCOUNTER — Encounter: Payer: Self-pay | Admitting: Emergency Medicine

## 2020-04-10 ENCOUNTER — Ambulatory Visit (INDEPENDENT_AMBULATORY_CARE_PROVIDER_SITE_OTHER)
Admission: EM | Admit: 2020-04-10 | Discharge: 2020-04-10 | Disposition: A | Discharge status: Home / Self Care | Payer: Medicare HMO | Source: Home / Self Care | Attending: Family Medicine | Admitting: Family Medicine

## 2020-04-10 ENCOUNTER — Emergency Department: Payer: Medicare HMO

## 2020-04-10 ENCOUNTER — Emergency Department
Admission: EM | Admit: 2020-04-10 | Discharge: 2020-04-10 | Disposition: A | Discharge status: Home / Self Care | Payer: Medicare HMO | Attending: Emergency Medicine | Admitting: Emergency Medicine

## 2020-04-10 ENCOUNTER — Other Ambulatory Visit: Payer: Self-pay

## 2020-04-10 DIAGNOSIS — Z794 Long term (current) use of insulin: Secondary | ICD-10-CM | POA: Insufficient documentation

## 2020-04-10 DIAGNOSIS — N186 End stage renal disease: Secondary | ICD-10-CM | POA: Insufficient documentation

## 2020-04-10 DIAGNOSIS — L97522 Non-pressure chronic ulcer of other part of left foot with fat layer exposed: Secondary | ICD-10-CM | POA: Insufficient documentation

## 2020-04-10 DIAGNOSIS — Z8673 Personal history of transient ischemic attack (TIA), and cerebral infarction without residual deficits: Secondary | ICD-10-CM | POA: Diagnosis not present

## 2020-04-10 DIAGNOSIS — L03116 Cellulitis of left lower limb: Secondary | ICD-10-CM

## 2020-04-10 DIAGNOSIS — L97521 Non-pressure chronic ulcer of other part of left foot limited to breakdown of skin: Secondary | ICD-10-CM

## 2020-04-10 DIAGNOSIS — Z79899 Other long term (current) drug therapy: Secondary | ICD-10-CM | POA: Diagnosis not present

## 2020-04-10 DIAGNOSIS — E1022 Type 1 diabetes mellitus with diabetic chronic kidney disease: Secondary | ICD-10-CM | POA: Insufficient documentation

## 2020-04-10 DIAGNOSIS — R600 Localized edema: Secondary | ICD-10-CM | POA: Diagnosis not present

## 2020-04-10 DIAGNOSIS — Z87891 Personal history of nicotine dependence: Secondary | ICD-10-CM | POA: Insufficient documentation

## 2020-04-10 DIAGNOSIS — I252 Old myocardial infarction: Secondary | ICD-10-CM | POA: Insufficient documentation

## 2020-04-10 DIAGNOSIS — Z7982 Long term (current) use of aspirin: Secondary | ICD-10-CM | POA: Diagnosis not present

## 2020-04-10 DIAGNOSIS — R200 Anesthesia of skin: Secondary | ICD-10-CM

## 2020-04-10 DIAGNOSIS — Z992 Dependence on renal dialysis: Secondary | ICD-10-CM | POA: Insufficient documentation

## 2020-04-10 DIAGNOSIS — I2510 Atherosclerotic heart disease of native coronary artery without angina pectoris: Secondary | ICD-10-CM | POA: Diagnosis not present

## 2020-04-10 DIAGNOSIS — I120 Hypertensive chronic kidney disease with stage 5 chronic kidney disease or end stage renal disease: Secondary | ICD-10-CM | POA: Diagnosis not present

## 2020-04-10 DIAGNOSIS — L97529 Non-pressure chronic ulcer of other part of left foot with unspecified severity: Secondary | ICD-10-CM | POA: Diagnosis present

## 2020-04-10 DIAGNOSIS — E10621 Type 1 diabetes mellitus with foot ulcer: Secondary | ICD-10-CM | POA: Insufficient documentation

## 2020-04-10 LAB — CBC WITH DIFFERENTIAL/PLATELET
Abs Immature Granulocytes: 0.02 10*3/uL (ref 0.00–0.07)
Basophils Absolute: 0.1 10*3/uL (ref 0.0–0.1)
Basophils Relative: 1 %
Eosinophils Absolute: 0.2 10*3/uL (ref 0.0–0.5)
Eosinophils Relative: 3 %
HCT: 33.2 % — ABNORMAL LOW (ref 39.0–52.0)
Hemoglobin: 10.9 g/dL — ABNORMAL LOW (ref 13.0–17.0)
Immature Granulocytes: 0 %
Lymphocytes Relative: 20 %
Lymphs Abs: 1.3 10*3/uL (ref 0.7–4.0)
MCH: 31.1 pg (ref 26.0–34.0)
MCHC: 32.8 g/dL (ref 30.0–36.0)
MCV: 94.9 fL (ref 80.0–100.0)
Monocytes Absolute: 0.6 10*3/uL (ref 0.1–1.0)
Monocytes Relative: 9 %
Neutro Abs: 4.4 10*3/uL (ref 1.7–7.7)
Neutrophils Relative %: 67 %
Platelets: 202 10*3/uL (ref 150–400)
RBC: 3.5 MIL/uL — ABNORMAL LOW (ref 4.22–5.81)
RDW: 13.8 % (ref 11.5–15.5)
WBC: 6.6 10*3/uL (ref 4.0–10.5)
nRBC: 0 % (ref 0.0–0.2)

## 2020-04-10 LAB — COMPREHENSIVE METABOLIC PANEL
ALT: 23 U/L (ref 0–44)
AST: 24 U/L (ref 15–41)
Albumin: 3.7 g/dL (ref 3.5–5.0)
Alkaline Phosphatase: 141 U/L — ABNORMAL HIGH (ref 38–126)
Anion gap: 12 (ref 5–15)
BUN: 28 mg/dL — ABNORMAL HIGH (ref 8–23)
CO2: 29 mmol/L (ref 22–32)
Calcium: 7.8 mg/dL — ABNORMAL LOW (ref 8.9–10.3)
Chloride: 98 mmol/L (ref 98–111)
Creatinine, Ser: 4.95 mg/dL — ABNORMAL HIGH (ref 0.61–1.24)
GFR calc Af Amer: 12 mL/min — ABNORMAL LOW (ref >60)
GFR calc non Af Amer: 10 mL/min — ABNORMAL LOW (ref >60)
Glucose, Bld: 256 mg/dL — ABNORMAL HIGH (ref 70–99)
Potassium: 3.5 mmol/L (ref 3.5–5.1)
Sodium: 139 mmol/L (ref 135–145)
Total Bilirubin: 0.7 mg/dL (ref 0.3–1.2)
Total Protein: 7.4 g/dL (ref 6.5–8.1)

## 2020-04-10 MED ORDER — DOXYCYCLINE HYCLATE 100 MG PO TABS
100.0000 mg | ORAL_TABLET | Freq: Once | ORAL | Status: AC
  Administered 2020-04-10: 100 mg via ORAL
  Filled 2020-04-10: qty 1

## 2020-04-10 MED ORDER — DOXYCYCLINE HYCLATE 100 MG PO TABS: 100.0000 mg | ORAL_TABLET | Freq: Two times a day (BID) | ORAL | 0 refills | Status: AC

## 2020-04-10 NOTE — ED Notes (Signed)
Pt currently sleeping at this time.

## 2020-04-10 NOTE — ED Notes (Signed)
Pt reports having right arm swelling for the last 2 weeks. Fistula has thrill noted. Pt states tear to left big toe on the undersurface. Pt states he does not know how that happened.

## 2020-04-10 NOTE — ED Provider Notes (Signed)
[facility] Emergency Department Provider Note  ____________________________________________  Time seen: Approximately 4:34 PM  I have reviewed the triage vital signs and the nursing notes.   HISTORY  Chief Complaint Cellulitis, Arm Swelling, and Shortness of Breath    HPI Cory Miller is a 79 y.o. male who presents to the emergency department from urgent care for complaint of left foot wound, swelling of the right upper extremity.  Patient was evaluated in urgent care and sent to the emergency department for further evaluation regarding his foot ulcer and swelling of the right upper extremity.  Patient has a dialysis shunt in the right upper extremity.  He states that it is working appropriately and he has it accessed Monday, Wednesday, Friday for dialysis.  Patient states that he has had swelling of his upper extremity but no reported erythema.  Patient reports that his arm feels "sore" but denies any frank pain.  Patient is also concerned for diabetic foot wound to the left foot.  Patient is unsure of any injury or trauma to the foot and has decreased sensation to the lower extremities.  Patient denies any systemic complaints of fevers or chills, URI symptoms, chest pain, shortness of breath, nausea vomiting or abdominal pain.  No medication for other complaint prior to arrival.  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         Past Medical History:  Diagnosis Date  . Blood dyscrasia    prostate  . CAD (coronary artery disease) of artery bypass graft   . Carotid atherosclerosis   . Carotid stenosis   . Chronic kidney disease   . CKD (chronic kidney disease), stage V ([place])   . CVA (cerebrovascular accident) ([place])   . Diabetes mellitus without complication ([place])   . Dyspnea   . ED (erectile dysfunction)   . Elevated troponin  I level   . Esophageal ulcer   . GERD (gastroesophageal reflux disease)   . Gout   . Hard of hearing    Pt very hard  of hearing  . Hyperlipidemia   . Hypertension   . MI (myocardial infarction) ([place])   . Neuropathy   . Occlusion of both internal carotid arteries   . Prostate CA ([place])   . Sleep apnea     Patient Active Problem List   Diagnosis Date Noted  . Palliative care by specialist   . Goals of care, counseling/discussion   . Advanced care planning/counseling discussion   . Hypokalemia 04/12/2019  . Anemia of chronic disease 04/12/2019  . COVID-19 virus infection 04/12/2019  . End stage renal disease ([place]) 12/26/2018  . SOB (shortness of breath) 10/17/2018  . Pressure injury of skin 08/23/2018  . Respiratory failure ([place]) 08/22/2018  . Sepsis ([place]) 04/04/2016  . Diabetes mellitus with end stage renal disease ([place]) 04/04/2016  . HTN (hypertension) 04/04/2016  . GERD (gastroesophageal reflux disease) 04/04/2016  . CAD (coronary artery disease) 04/04/2016  . CKD (chronic kidney disease), stage IV ([place]) 04/04/2016  . Abdominal pain 08/02/2015  . Abdominal pain, acute, epigastric   . Pain in the abdomen     Past Surgical History:  Procedure Laterality Date  . ARTERIAL BYPASS SURGRY    . AV FISTULA PLACEMENT Right 06/14/2018   Procedure: ARTERIOVENOUS (AV) FISTULA CREATION(brachiocephalic);  Surgeon: Katha Cabal, MD;  Location: [facility] ORS;  Service: Vascular;  Laterality: Right;  . CAROTID  ENDARTERECTOMY Left   . chest tubes x 2     for pneumothorax  . COLONOSCOPY WITH PROPOFOL N/A 06/02/2016   Procedure: COLONOSCOPY WITH PROPOFOL;  Surgeon: Lollie Sails, MD;  Location: [facility] ENDOSCOPY;  Service: Endoscopy;  Laterality: N/A;  . COLONOSCOPY WITH PROPOFOL N/A 07/28/2016   Procedure: COLONOSCOPY WITH PROPOFOL;  Surgeon: Lollie Sails, MD;  Location: [facility] ENDOSCOPY;  Service: Endoscopy;  Laterality: N/A;  . CORONARY ANGIOPLASTY    . CORONARY ARTERY BYPASS GRAFT     . decortication,parietal pleurectomy Left 05/2002  . ESOPHAGOGASTRODUODENOSCOPY N/A 08/05/2015   Procedure: ESOPHAGOGASTRODUODENOSCOPY (EGD);  Surgeon: Hulen Luster, MD;  Location: [facility] ENDOSCOPY;  Service: Endoscopy;  Laterality: N/A;  . ESOPHAGOGASTRODUODENOSCOPY (EGD) WITH PROPOFOL N/A 09/30/2015   Procedure: ESOPHAGOGASTRODUODENOSCOPY (EGD) WITH PROPOFOL;  Surgeon: Hulen Luster, MD;  Location: [facility] ENDOSCOPY;  Service: Gastroenterology;  Laterality: N/A;  . ostate cryoablation    . partial thoracoscopy  6/03    Prior to Admission medications   Medication Sig Start Date End Date Taking? Authorizing Provider  allopurinol (ZYLOPRIM) 100 MG tablet Take 100 mg by mouth daily.    [provider]  aspirin EC 81 MG tablet Take 81 mg by mouth daily.    [provider]  atorvastatin (LIPITOR) 40 MG tablet Take 40 mg by mouth at bedtime.    [provider]  calcium acetate (PHOSLO) 667 MG capsule Take 1 capsule (667 mg total) by mouth 3 (three) times daily with meals. 05/09/19   Kayleen Memos, DO  cholecalciferol (VITAMIN D) 1000 units tablet Take 1,000 Units by mouth daily.     [provider]  clopidogrel (PLAVIX) 75 MG tablet Take 75 mg by mouth daily.    [provider]  colchicine 0.6 MG tablet Take 0.5 tablets (0.3 mg total) by mouth every Monday, Wednesday, and Friday with hemodialysis. 05/12/19   Kayleen Memos, DO  dorzolamide-timolol (COSOPT) 22.3-6.8 MG/ML ophthalmic solution Place 1 drop into both eyes 2 (two) times daily.  02/28/18   [provider]  doxycycline (VIBRA-TABS) 100 MG tablet Take 1 tablet (100 mg total) by mouth 2 (two) times daily. 04/10/20   Hanny Elsberry, Charline Bills, PA-C  famotidine (PEPCID) 20 MG tablet Take 1 tablet (20 mg total) by mouth daily. 05/10/19   Kayleen Memos, DO  Flaxseed, Linseed, (EQL FLAXSEED OIL) 1200 MG CAPS Take 1,200 mg by mouth daily.    [provider]  hydrALAZINE (APRESOLINE) 100 MG tablet Take 1  tablet (100 mg total) by mouth 2 (two) times a day. 05/10/19   Kayleen Memos, DO  hydrOXYzine (ATARAX/VISTARIL) 25 MG tablet Take 25 mg by mouth 4 (four) times daily as needed for itching.     [provider]  insulin aspart (NOVOLOG) 100 UNIT/ML FlexPen Inject 10 Units into the skin 3 (three) times daily with meals.     [provider]  insulin detemir (LEVEMIR) 100 UNIT/ML injection Inject 0.1 mLs (10 Units total) into the skin daily. Patient taking differently: Inject 10 Units into the skin at bedtime.  08/28/18   Vaughan Basta, MD  ketoconazole (NIZORAL) 2 % shampoo Apply 1 application topically 2 (two) times a week.  11/30/17   [provider]  loperamide (IMODIUM) 2 MG capsule Take 1 capsule (2 mg total) by mouth as needed for diarrhea or loose stools. 05/12/19   Kayleen Memos, DO  midodrine (PROAMATINE) 10 MG tablet Take 10 mg by mouth at bedtime. 11/27/19  [provider]  multivitamin (RENA-VIT) TABS tablet Take 1 tablet by mouth at bedtime. 10/23/18   Fritzi Mandes, MD  nitroGLYCERIN (NITROSTAT) 0.4 MG SL tablet Place 0.4 mg under the tongue every 5 (five) minutes as needed for chest pain.  12/05/18   [provider]  pantoprazole (PROTONIX) 40 MG tablet Take 1 tablet (40 mg total) by mouth 2 (two) times daily. 05/09/19   Kayleen Memos, DO  sodium bicarbonate 650 MG tablet Take 650 mg by mouth 2 (two) times daily.    [provider]  SPS 15 GM/60ML suspension Take 60 mLs by mouth as directed. When eating potassium rich foods 06/05/18   [provider]  triamcinolone cream (KENALOG) 0.1 % Apply 1 application topically 2 (two) times daily as needed ([place]).     [provider]  vitamin C (VITAMIN C) 500 MG tablet Take 1 tablet (500 mg total) by mouth daily. 05/09/19   Kayleen Memos, DO  zinc sulfate 220 (50 Zn) MG capsule Take 1 capsule (220 mg total) by mouth daily. 05/10/19   Kayleen Memos, DO     Allergies Patient has no known allergies.  Family History  Problem Relation Age of Onset  . CAD Other   . Diabetes Other   . Cancer Other   . Stroke Other   . Diabetes Mother   . Cancer Father     Social History Social History   Tobacco Use  . Smoking status: Former Smoker    Types: Cigarettes  . Smokeless tobacco: Never Used  Substance Use Topics  . Alcohol use: Not Currently    Alcohol/week: 0.0 standard drinks  . Drug use: No     Review of Systems  Constitutional: No fever/chills Eyes: No visual changes. No discharge ENT: No upper respiratory complaints. Cardiovascular: no chest pain. Respiratory: no cough. No SOB. Gastrointestinal: No abdominal pain.  No nausea, no vomiting.  No diarrhea.  No constipation. Musculoskeletal: Positive for right arm edema, diabetic foot wound to the left foot Skin: Negative for rash, abrasions, lacerations, ecchymosis. Neurological: Negative for headaches, focal weakness or numbness. 10-point ROS otherwise negative.  ____________________________________________   PHYSICAL EXAM:  VITAL SIGNS: ED Triage Vitals [04/10/20 1447]  Enc Vitals Group     BP (!) 122/55     Pulse Rate 98     Resp 16     Temp 98.2 F (36.8 C)     Temp Source Oral     SpO2 96 %     Weight      Height      Head Circumference      Peak Flow      Pain Score      Pain Loc      Pain Edu?      Excl. in [place]?      Constitutional: Alert and oriented. Well appearing and in no acute distress. Eyes: Conjunctivae are normal. PERRL. EOMI. Head: Atraumatic. ENT:      Ears:       Nose: No congestion/rhinnorhea.      Mouth/Throat: Mucous membranes are moist.  Neck: No stridor.    Cardiovascular: Normal rate, regular rhythm. Normal S1 and S2.  Good peripheral circulation. Respiratory: Normal respiratory effort without tachypnea or retractions. Lungs CTAB. Good air entry to the bases with no decreased or absent breath sounds. Musculoskeletal: Full  range of motion to all extremities. No gross deformities appreciated.  Visualization of the right upper extremity feels no deformity.  No gross erythema.  Minimal edema noted about the right upper extremity/humeral region.  Dialysis shunt is in place.  Patient has good range of motion to the shoulder and elbow.  Palpation in the area reveals palpable dialysis shunt but no other palpable findings.  Radial pulse and sensation intact distally.  Examination of the left foot reveals 2 findings, 1 to the lateral aspect of the foot as well as 1 to the plantar aspect of the MTP joint great toe.  These are consistent with diabetic foot ulcers.  No gross erythema or edema though there is erosion to the subcutaneous tissue along the great toe finding.  Sensation is decreased diffusely about the left foot but is equal to the right foot.  This is consistent with patient's peripheral neuropathy.  Dorsalis pedis pulse intact.  Capillary refill less than 2 seconds all digits. Neurologic:  Normal speech and language. No gross focal neurologic deficits are appreciated.  Skin:  Skin is warm, dry and intact. No rash noted. Psychiatric: Mood and affect are normal. Speech and behavior are normal. Patient exhibits appropriate insight and judgement.   ____________________________________________   LABS (all labs ordered are listed, but only abnormal results are displayed)  Labs Reviewed  CBC WITH DIFFERENTIAL/PLATELET - Abnormal; Notable for the following components:      Result Value   RBC 3.50 (*)    Hemoglobin 10.9 (*)    HCT 33.2 (*)    All other components within normal limits  COMPREHENSIVE METABOLIC PANEL - Abnormal; Notable for the following components:   Glucose, Bld 256 (*)    BUN 28 (*)    Creatinine, Ser 4.95 (*)    Calcium 7.8 (*)    Alkaline Phosphatase 141 (*)    GFR calc non Af Amer 10 (*)    GFR calc Af Amer 12 (*)    All other components within normal limits    ____________________________________________  EKG   ____________________________________________  RADIOLOGY I personally viewed and evaluated these images as part of my medical decision making, as well as reviewing the written report by the radiologist.  DG Chest 2 View  Result Date: 04/10/2020 CLINICAL DATA:  Shortness of breath. EXAM: CHEST - 2 VIEW COMPARISON:  Most recent radiograph 04/19/2019 FINDINGS: Post median sternotomy. Persistent low lung volumes. Cardiomegaly is stable with unchanged mediastinal contours. Atherosclerosis and tortuosity of the thoracic aorta. Trace pleural effusions. Streaky bibasilar opacities favor atelectasis. Vascular congestion without overt pulmonary edema. No acute osseous abnormalities are seen. IMPRESSION: 1. Low lung volumes. Streaky bibasilar opacities favor atelectasis. 2. Trace pleural effusions. 3. Stable cardiomegaly. Mild vascular congestion. Aortic Atherosclerosis (ICD10-I70.0). Electronically Signed   By: Keith Rake M.D.   On: 04/10/2020 15:26   US Venous Img Upper Uni Right(DVT)  Result Date: 04/10/2020 CLINICAL DATA:  Edema. History of arteriovenous fistula for dialysis. EXAM: RIGHT UPPER EXTREMITY VENOUS DOPPLER ULTRASOUND TECHNIQUE: Gray-scale sonography with graded compression, as well as color Doppler and duplex ultrasound were performed to evaluate the upper extremity deep venous system from the level of the subclavian vein and including the jugular, axillary, basilic, radial, ulnar and upper cephalic vein. Spectral Doppler was utilized to evaluate flow at rest and with distal augmentation maneuvers. COMPARISON:  None. FINDINGS: Contralateral Subclavian Vein: Respiratory phasicity is normal and symmetric with the symptomatic side. No evidence of thrombus. Normal compressibility. Internal Jugular Vein: No evidence of thrombus. Normal compressibility, respiratory phasicity and response to augmentation. Subclavian Vein: No evidence of  thrombus. Normal compressibility, respiratory phasicity  and response to augmentation. Axillary Vein: No evidence of thrombus. Normal compressibility, respiratory phasicity and response to augmentation. Cephalic Vein: No evidence of thrombus. Normal compressibility, respiratory phasicity and response to augmentation. Basilic Vein: No evidence of thrombus. Normal compressibility, respiratory phasicity and response to augmentation. Brachial Veins: No evidence of thrombus. Normal compressibility, respiratory phasicity and response to augmentation. Radial Veins: No evidence of thrombus. Normal compressibility, respiratory phasicity and response to augmentation. Ulnar Veins: No evidence of thrombus. Normal compressibility, respiratory phasicity and response to augmentation. Other Findings: Grossly patent arteriovenous fistula. Velocities not assessed. Significant narrowing at the distal anastomosis best seen on image 34, approximately 1.2 cm. IMPRESSION: No evidence of DVT within the RIGHT upper extremity. Narrowing at the distal anastomosis of the AV fistula, correlate with fistula function. Electronically Signed   By: Zetta Bills M.D.   On: 04/10/2020 18:07   DG Foot Complete Left  Result Date: 04/10/2020 CLINICAL DATA:  Diabetic foot wound with known toe and foot pain, initial encounter. History of gout EXAM: LEFT FOOT - COMPLETE 3+ VIEW COMPARISON:  11/28/2019 FINDINGS: Small erosive changes are noted in the head of the first metatarsal and stable from the prior exam. No acute fracture or dislocation is noted. Soft tissue wound is noted along the plantar aspect of the foot consistent with the given clinical history. No definitive underlying erosive changes are seen to suggest osteomyelitis. Tarsal degenerative changes are noted as well as calcaneal spurs. IMPRESSION: Chronic degenerative change consistent with the given clinical history of gout. No acute evidence of osteomyelitis is noted. Electronically Signed    By: Inez Catalina M.D.   On: 04/10/2020 19:22    ____________________________________________    PROCEDURES  Procedure(s) performed:    Procedures    Medications  doxycycline (VIBRA-TABS) tablet 100 mg (100 mg Oral Given 04/10/20 1945)     ____________________________________________   INITIAL IMPRESSION / ASSESSMENT AND PLAN / ED COURSE  Pertinent labs & imaging results that were available during my care of the patient were reviewed by me and considered in my medical decision making (see chart for details).  Review of the [place] CSRS was performed in accordance of the [place] prior to dispensing any controlled drugs.           Patient's diagnosis is consistent with edema of the right upper extremity, diabetic foot wound.  Patient presented to emergency department for 2 complaints.  Ultrasound reveals no DVT in the upper extremity.  Patent dialysis shunt.  This is working appropriately according to the patient.  No evidence of cellulitis on exam.  Labs are reassuring.  Visualization of left foot reveals findings consistent with diabetic foot.  No gross cellulitic changes.  X-ray reveals no findings consistent with osteomyelitis.  Patient will be placed on doxycycline for his diabetic foot and referred to podiatry.  Differential included abscess to the right upper extremity, shunt complication, DVT.  Differential for patient's foot included cellulitis, abscess, osteomyelitis, diabetic foot..  Again patient should follow-up with primary care, nephrologist, podiatrist.  Patient is given ED precautions to return to the ED for any worsening or new symptoms.     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 Despite best efforts to proofread, errors can occur which can change the meaning. Any change was purely unintentional.    Darletta Moll, PA-C 04/10/20 2010    Drenda Freeze, MD 04/11/20 [phone]

## 2020-04-10 NOTE — ED Notes (Signed)
Pt ambulatory to restroom with cane.

## 2020-04-10 NOTE — ED Triage Notes (Addendum)
Patient in today c/o right arm swelling and numbness off & on x 2 weeks. This is the arm patient get dialysis in.  Patient also c/o left great toe pain and left foot pain. Patient has a history of gout. Upon examination, patient has a large cut under his left great toe.  Patient has appointment with his PCP on Apr 22, 2020 at [facility].

## 2020-04-10 NOTE — Discharge Instructions (Signed)
Recommend patient go to Emergency Department for further evaluation and management °

## 2020-04-10 NOTE — ED Provider Notes (Signed)
[facility]    CSN: [id number] Arrival date & time: 04/10/20  8366      History   Chief Complaint Chief Complaint  Patient presents with  . Arm Swelling    right  . Toe Pain    left great    HPI Cory Miller is a 79 y.o. male.   79 yo male with a chronic medical problems including DM with complication, CKD (on dialysis), CAD, presents with a 2 weeks h/o right arm swelling and numbness of the whole arm. Denies any trauma, however states dialysis fistula is on that arm. Denies any falls or other injuries, fevers, chills, rash, chest pains, shortness of breath, vision changes, unilateral lower extremity numbness.   Also c/o left great toe pain and left foot pain and redness for the past week. Denies any injuries. States he has a h/o gout.    Toe Pain    Past Medical History:  Diagnosis Date  . Blood dyscrasia    prostate  . CAD (coronary artery disease) of artery bypass graft   . Carotid atherosclerosis   . Carotid stenosis   . Chronic kidney disease   . CKD (chronic kidney disease), stage V ([place])   . CVA (cerebrovascular accident) ([place])   . Diabetes mellitus without complication ([place])   . Dyspnea   . ED (erectile dysfunction)   . Elevated troponin I level   . Esophageal ulcer   . GERD (gastroesophageal reflux disease)   . Gout   . Hard of hearing    Pt very hard  of hearing  . Hyperlipidemia   . Hypertension   . MI (myocardial infarction) ([place])   . Neuropathy   . Occlusion of both internal carotid arteries   . Prostate CA ([place])   . Sleep apnea     Patient Active Problem List   Diagnosis Date Noted  . Palliative care by specialist   . Goals of care, counseling/discussion   . Advanced care planning/counseling discussion   . Hypokalemia 04/12/2019  . Anemia of chronic disease 04/12/2019  . COVID-19 virus infection 04/12/2019  . End stage renal disease ([place]) 12/26/2018  . SOB (shortness of breath) 10/17/2018  . Pressure injury of skin  08/23/2018  . Respiratory failure ([place]) 08/22/2018  . Sepsis ([place]) 04/04/2016  . 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 HTN (hypertension) 04/04/2016  . GERD (gastroesophageal reflux disease) 04/04/2016  . CAD (coronary artery disease) 04/04/2016  . CKD (chronic kidney disease), stage IV ([place]) 04/04/2016  . Abdominal pain 08/02/2015  . Abdominal pain, acute, epigastric   . Pain in the abdomen     Past Surgical History:  Procedure Laterality Date  . ARTERIAL BYPASS SURGRY    . AV FISTULA PLACEMENT Right 06/14/2018   Procedure: ARTERIOVENOUS (AV) FISTULA CREATION(brachiocephalic);  Surgeon: Katha Cabal, MD;  Location: [facility] ORS;  Service: Vascular;  Laterality: Right;  . CAROTID ENDARTERECTOMY Left   . chest tubes x 2     for pneumothorax  . COLONOSCOPY WITH PROPOFOL N/A 06/02/2016   Procedure: COLONOSCOPY WITH PROPOFOL;  Surgeon: Lollie Sails, MD;  Location: [facility] ENDOSCOPY;  Service: Endoscopy;  Laterality: N/A;  . COLONOSCOPY WITH PROPOFOL N/A 07/28/2016   Procedure: COLONOSCOPY WITH PROPOFOL;  Surgeon: Lollie Sails, MD;  Location: [facility] ENDOSCOPY;  Service: Endoscopy;  Laterality: N/A;  . CORONARY ANGIOPLASTY    . CORONARY ARTERY BYPASS GRAFT    . decortication,parietal pleurectomy Left 05/2002  . ESOPHAGOGASTRODUODENOSCOPY N/A  08/05/2015   Procedure: ESOPHAGOGASTRODUODENOSCOPY (EGD);  Surgeon: Hulen Luster, MD;  Location: [facility] ENDOSCOPY;  Service: Endoscopy;  Laterality: N/A;  . ESOPHAGOGASTRODUODENOSCOPY (EGD) WITH PROPOFOL N/A 09/30/2015   Procedure: ESOPHAGOGASTRODUODENOSCOPY (EGD) WITH PROPOFOL;  Surgeon: Hulen Luster, MD;  Location: [facility] ENDOSCOPY;  Service: Gastroenterology;  Laterality: N/A;  . ostate cryoablation    . partial thoracoscopy  6/03       Home Medications    Prior to Admission medications   Medication Sig Start Date End Date Taking? Authorizing Provider  allopurinol (ZYLOPRIM) 100 MG tablet Take 100 mg by mouth daily.   Yes  [provider]  aspirin EC 81 MG tablet Take 81 mg by mouth daily.   Yes [provider]  atorvastatin (LIPITOR) 40 MG tablet Take 40 mg by mouth at bedtime.   Yes [provider]  calcium acetate (PHOSLO) 667 MG capsule Take 1 capsule (667 mg total) by mouth 3 (three) times daily with meals. 05/09/19  Yes Kayleen Memos, DO  cholecalciferol (VITAMIN D) 1000 units tablet Take 1,000 Units by mouth daily.    Yes [provider]  clopidogrel (PLAVIX) 75 MG tablet Take 75 mg by mouth daily.   Yes [provider]  colchicine 0.6 MG tablet Take 0.5 tablets (0.3 mg total) by mouth every Monday, Wednesday, and Friday with hemodialysis. 05/12/19  Yes Hall, Carole N, DO  dorzolamide-timolol (COSOPT) 22.3-6.8 MG/ML ophthalmic solution Place 1 drop into both eyes 2 (two) times daily.  02/28/18  Yes [provider]  famotidine (PEPCID) 20 MG tablet Take 1 tablet (20 mg total) by mouth daily. 05/10/19  Yes Hall, Carole N, DO  Flaxseed, Linseed, (EQL FLAXSEED OIL) 1200 MG CAPS Take 1,200 mg by mouth daily.   Yes [provider]  hydrALAZINE (APRESOLINE) 100 MG tablet Take 1 tablet (100 mg total) by mouth 2 (two) times a day. 05/10/19  Yes Kayleen Memos, DO  hydrOXYzine (ATARAX/VISTARIL) 25 MG tablet Take 25 mg by mouth 4 (four) times daily as needed for itching.    Yes [provider]  insulin aspart (NOVOLOG) 100 UNIT/ML FlexPen Inject 10 Units into the skin 3 (three) times daily with meals.    Yes [provider]  insulin detemir (LEVEMIR) 100 UNIT/ML injection Inject 0.1 mLs (10 Units total) into the skin daily. Patient taking differently: Inject 10 Units into the skin at bedtime.  08/28/18  Yes Vaughan Basta, MD  loperamide (IMODIUM) 2 MG capsule Take 1 capsule (2 mg total) by mouth as needed for diarrhea or loose stools. 05/12/19  Yes Irene Pap N, DO  midodrine (PROAMATINE) 10 MG tablet Take 10 mg by mouth at bedtime.  11/27/19  Yes [provider]  multivitamin (RENA-VIT) TABS tablet Take 1 tablet by mouth at bedtime. 10/23/18  Yes Fritzi Mandes, MD  nitroGLYCERIN (NITROSTAT) 0.4 MG SL tablet Place 0.4 mg under the tongue every 5 (five) minutes as needed for chest pain.  12/05/18  Yes [provider]  pantoprazole (PROTONIX) 40 MG tablet Take 1 tablet (40 mg total) by mouth 2 (two) times daily. 05/09/19  Yes Irene Pap N, DO  sodium bicarbonate 650 MG tablet Take 650 mg by mouth 2 (two) times daily.   Yes [provider]  SPS 15 GM/60ML suspension Take 60 mLs by mouth as directed. When eating potassium rich foods 06/05/18  Yes [provider]  triamcinolone cream (KENALOG) 0.1 % Apply 1 application topically 2 (two) times daily as needed (  [place]).    Yes [provider]  vitamin C (VITAMIN C) 500 MG tablet Take 1 tablet (500 mg total) by mouth daily. 05/09/19  Yes Irene Pap N, DO  zinc sulfate 220 (50 Zn) MG capsule Take 1 capsule (220 mg total) by mouth daily. 05/10/19  Yes Hall, Carole N, DO  ketoconazole (NIZORAL) 2 % shampoo Apply 1 application topically 2 (two) times a week.  11/30/17   [provider]    Family History Family History  Problem Relation Age of Onset  . CAD Other   . Diabetes Other   . Cancer Other   . Stroke Other   . Diabetes Mother   . Cancer Father     Social History Social History   Tobacco Use  . Smoking status: Former Smoker    Types: Cigarettes  . Smokeless tobacco: Never Used  Substance Use Topics  . Alcohol use: Not Currently    Alcohol/week: 0.0 standard drinks  . Drug use: No     Allergies   Patient has no known allergies.   Review of Systems Review of Systems   Physical Exam Triage Vital Signs ED Triage Vitals  Enc Vitals Group     BP 04/10/20 0930 (!) 145/61     Pulse Rate 04/10/20 0930 93     Resp 04/10/20 0930 18     Temp 04/10/20 0930 97.6 F (36.4 C)     Temp Source 04/10/20 0930 Oral      SpO2 04/10/20 0930 97 %     Weight 04/10/20 0933 256 lb (116.1 kg)     Height 04/10/20 0933 6\' 1"  (1.854 m)     Head Circumference --      Peak Flow --      Pain Score 04/10/20 0932 10     Pain Loc --      Pain Edu? --      Excl. in [place]? --    No data found.  Updated Vital Signs BP (!) 145/61 (BP Location: Left Arm)   Pulse 93   Temp 97.6 F (36.4 C) (Oral)   Resp 18   Ht 6\' 1"  (1.854 m)   Wt 116.1 kg   SpO2 97%   BMI 33.78 kg/m   Visual Acuity Right Eye Distance:   Left Eye Distance:   Bilateral Distance:    Right Eye Near:   Left Eye Near:    Bilateral Near:     Physical Exam Vitals and nursing note reviewed.  Constitutional:      General: He is not in acute distress.    Appearance: He is not toxic-appearing or diaphoretic.  Musculoskeletal:     Right upper arm: Swelling present. No tenderness or bony tenderness.     Left foot: Normal capillary refill. Swelling, deformity (1cm ulceration plantar aspect below the great toe; skin blanchable erythema and tenderness over dorsal aspect of great toe), tenderness and bony tenderness (over the distal foot) present. Normal pulse.     Comments: Right upper extremity: + thrill at dialysis fistula site; unable to palpate radial pulse; no discoloration or rash  Neurological:     Mental Status: He is alert.      UC Treatments / Results  Labs (all labs ordered are listed, but only abnormal results are displayed) Labs Reviewed - No data to display  EKG   Radiology No results found.  Procedures Procedures (including critical care time)  Medications Ordered in UC Medications - No data to display  Initial Impression / Assessment and Plan / UC Course  I have reviewed the triage vital signs and the nursing notes.  Pertinent labs & imaging results that were available during my care of the patient were reviewed by me and considered in my medical decision making (see chart for details).     Final Clinical  Impressions(s) / UC Diagnoses   Final diagnoses:  Edema of right upper arm  Right arm numbness  Ulcerated, foot, left, limited to breakdown of skin (HCC)  Cellulitis of left foot     Discharge Instructions     Recommend patient go to Emergency Department for further evaluation and management    ED Prescriptions    None     1. diagnosis reviewed with patient; recommend patient go to Emergency Department for further evaluation and management. Patient in stable condition, verbalizes understanding and will proceed by private vehicle.    PDMP not reviewed this encounter.   Norval Gable, MD 04/10/20 1210

## 2020-04-10 NOTE — ED Triage Notes (Signed)
Pt to ED via POV from [facility] for left foot cellulitis and right arm swelling and numbness. According to paper work pt has an ulcer on his left foot. Pt is extremely HOH. Pt is in NAD. PT states that he is also having shortness of breath.

## 2020-07-18 IMAGING — DX PORTABLE CHEST - 1 VIEW
1 series · 1 of 1 positions shown · non-contrast
Comparison: Chest x-ray dated 04/17/2019 and chest x-ray dated
10/19/2018.

CLINICAL DATA: Shortness of breath

EXAM:
PORTABLE CHEST 1 VIEW

[chest]
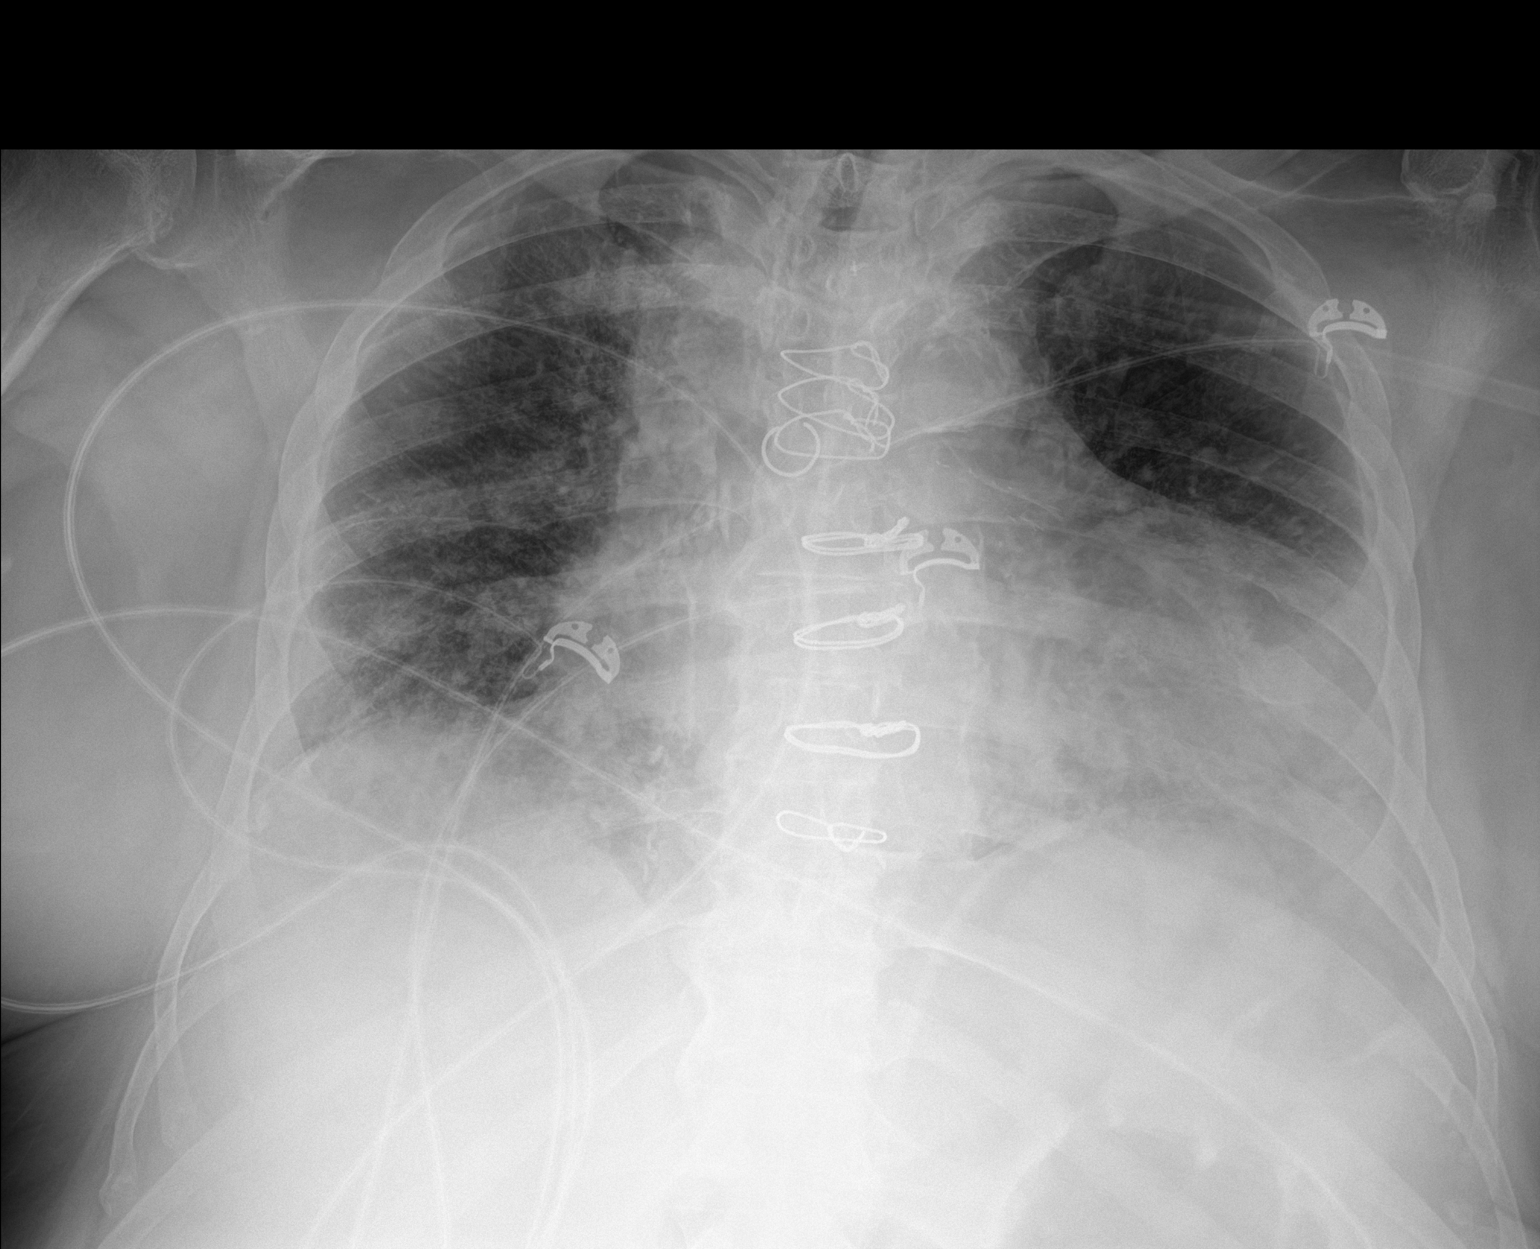

[1 of 1 positions shown; findings below may reference images not displayed]

FINDINGS: Stable cardiomegaly. Median sternotomy wires appear stable in
alignment. Persistent bibasilar opacities, likely atelectasis and/or
small pleural effusions. No new lung findings.
IMPRESSION: Stable chest x-ray. Persistent bibasilar opacities, likely
atelectasis and/or small pleural effusions.
